# Patient Record
Sex: Male | Born: 1940 | Race: White | Hispanic: No | Marital: Single | Smoking: Never smoker
Health system: Southern US, Community
[De-identification: ages and names within clinical notes are randomized; demographics above are authoritative.]

## PROBLEM LIST (undated history)

## (undated) DIAGNOSIS — R600 Localized edema: Secondary | ICD-10-CM

## (undated) DIAGNOSIS — IMO0002 Reserved for concepts with insufficient information to code with codable children: Secondary | ICD-10-CM

## (undated) DIAGNOSIS — E119 Type 2 diabetes mellitus without complications: Secondary | ICD-10-CM

## (undated) DIAGNOSIS — T7840XA Allergy, unspecified, initial encounter: Secondary | ICD-10-CM

## (undated) DIAGNOSIS — H9190 Unspecified hearing loss, unspecified ear: Secondary | ICD-10-CM

## (undated) DIAGNOSIS — Z8719 Personal history of other diseases of the digestive system: Secondary | ICD-10-CM

## (undated) DIAGNOSIS — E785 Hyperlipidemia, unspecified: Secondary | ICD-10-CM

## (undated) DIAGNOSIS — K219 Gastro-esophageal reflux disease without esophagitis: Secondary | ICD-10-CM

## (undated) DIAGNOSIS — I4892 Unspecified atrial flutter: Secondary | ICD-10-CM

## (undated) DIAGNOSIS — D369 Benign neoplasm, unspecified site: Secondary | ICD-10-CM

## (undated) DIAGNOSIS — J189 Pneumonia, unspecified organism: Secondary | ICD-10-CM

## (undated) DIAGNOSIS — R918 Other nonspecific abnormal finding of lung field: Secondary | ICD-10-CM

## (undated) DIAGNOSIS — F1910 Other psychoactive substance abuse, uncomplicated: Secondary | ICD-10-CM

## (undated) DIAGNOSIS — F419 Anxiety disorder, unspecified: Secondary | ICD-10-CM

## (undated) DIAGNOSIS — F329 Major depressive disorder, single episode, unspecified: Secondary | ICD-10-CM

## (undated) DIAGNOSIS — I2510 Atherosclerotic heart disease of native coronary artery without angina pectoris: Secondary | ICD-10-CM

## (undated) DIAGNOSIS — Z77098 Contact with and (suspected) exposure to other hazardous, chiefly nonmedicinal, chemicals: Secondary | ICD-10-CM

## (undated) DIAGNOSIS — N189 Chronic kidney disease, unspecified: Secondary | ICD-10-CM

## (undated) DIAGNOSIS — I10 Essential (primary) hypertension: Secondary | ICD-10-CM

## (undated) DIAGNOSIS — F4310 Post-traumatic stress disorder, unspecified: Secondary | ICD-10-CM

## (undated) DIAGNOSIS — Z974 Presence of external hearing-aid: Secondary | ICD-10-CM

## (undated) DIAGNOSIS — F32A Depression, unspecified: Secondary | ICD-10-CM

## (undated) DIAGNOSIS — I311 Chronic constrictive pericarditis: Secondary | ICD-10-CM

## (undated) DIAGNOSIS — K449 Diaphragmatic hernia without obstruction or gangrene: Secondary | ICD-10-CM

## (undated) DIAGNOSIS — K7031 Alcoholic cirrhosis of liver with ascites: Secondary | ICD-10-CM

## (undated) DIAGNOSIS — Z973 Presence of spectacles and contact lenses: Secondary | ICD-10-CM

## (undated) DIAGNOSIS — D509 Iron deficiency anemia, unspecified: Secondary | ICD-10-CM

## (undated) DIAGNOSIS — F1020 Alcohol dependence, uncomplicated: Secondary | ICD-10-CM

## (undated) DIAGNOSIS — I8390 Asymptomatic varicose veins of unspecified lower extremity: Secondary | ICD-10-CM

## (undated) DIAGNOSIS — J9601 Acute respiratory failure with hypoxia: Secondary | ICD-10-CM

## (undated) DIAGNOSIS — Z9889 Other specified postprocedural states: Secondary | ICD-10-CM

## (undated) DIAGNOSIS — Z972 Presence of dental prosthetic device (complete) (partial): Secondary | ICD-10-CM

## (undated) DIAGNOSIS — I4891 Unspecified atrial fibrillation: Secondary | ICD-10-CM

## (undated) DIAGNOSIS — E871 Hypo-osmolality and hyponatremia: Secondary | ICD-10-CM

## (undated) DIAGNOSIS — K5790 Diverticulosis of intestine, part unspecified, without perforation or abscess without bleeding: Secondary | ICD-10-CM

## (undated) HISTORY — PX: ATRIAL ABLATION SURGERY: SHX560

## (undated) HISTORY — DX: Diverticulosis of intestine, part unspecified, without perforation or abscess without bleeding: K57.90

## (undated) HISTORY — PX: UMBILICAL HERNIA REPAIR: SHX196

## (undated) HISTORY — PX: VARICOSE VEIN SURGERY: SHX832

## (undated) HISTORY — DX: Hyperlipidemia, unspecified: E78.5

## (undated) HISTORY — DX: Atherosclerotic heart disease of native coronary artery without angina pectoris: I25.10

## (undated) HISTORY — PX: COLONOSCOPY: SHX174

## (undated) HISTORY — DX: Unspecified atrial flutter: I48.92

## (undated) HISTORY — DX: Allergy, unspecified, initial encounter: T78.40XA

## (undated) HISTORY — DX: Unspecified atrial fibrillation: I48.91

## (undated) HISTORY — PX: TONSILLECTOMY: SUR1361

## (undated) HISTORY — DX: Alcoholic cirrhosis of liver with ascites: K70.31

## (undated) HISTORY — DX: Chronic kidney disease, unspecified: N18.9

## (undated) HISTORY — PX: POLYPECTOMY: SHX149

## (undated) HISTORY — DX: Asymptomatic varicose veins of unspecified lower extremity: I83.90

## (undated) HISTORY — DX: Reserved for concepts with insufficient information to code with codable children: IMO0002

## (undated) HISTORY — DX: Benign neoplasm, unspecified site: D36.9

## (undated) HISTORY — DX: Type 2 diabetes mellitus without complications: E11.9

## (undated) HISTORY — DX: Pneumonia, unspecified organism: J18.9

## (undated) HISTORY — DX: Other psychoactive substance abuse, uncomplicated: F19.10

## (undated) HISTORY — DX: Essential (primary) hypertension: I10

## (undated) HISTORY — DX: Anxiety disorder, unspecified: F41.9

## (undated) HISTORY — DX: Alcohol dependence, uncomplicated: F10.20

## (undated) HISTORY — DX: Diaphragmatic hernia without obstruction or gangrene: K44.9

## (undated) HISTORY — DX: Chronic constrictive pericarditis: I31.1

## (undated) HISTORY — DX: Localized edema: R60.0

## (undated) HISTORY — DX: Iron deficiency anemia, unspecified: D50.9

## (undated) HISTORY — DX: Gastro-esophageal reflux disease without esophagitis: K21.9

---
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
## 2003-11-01 ENCOUNTER — Emergency Department (HOSPITAL_COMMUNITY): Admission: EM | Admit: 2003-11-01 | Discharge: 2003-11-01 | Payer: Self-pay | Admitting: Emergency Medicine

## 2003-11-02 ENCOUNTER — Encounter (INDEPENDENT_AMBULATORY_CARE_PROVIDER_SITE_OTHER): Payer: Self-pay | Admitting: Specialist

## 2003-11-02 ENCOUNTER — Ambulatory Visit (HOSPITAL_COMMUNITY): Admission: RE | Admit: 2003-11-02 | Discharge: 2003-11-02 | Payer: Self-pay | Admitting: General Surgery

## 2004-06-11 ENCOUNTER — Ambulatory Visit: Payer: Self-pay | Admitting: Gastroenterology

## 2004-06-19 DIAGNOSIS — D369 Benign neoplasm, unspecified site: Secondary | ICD-10-CM

## 2004-06-19 HISTORY — DX: Benign neoplasm, unspecified site: D36.9

## 2004-06-25 ENCOUNTER — Ambulatory Visit: Payer: Self-pay | Admitting: Gastroenterology

## 2004-07-03 ENCOUNTER — Ambulatory Visit: Payer: Self-pay | Admitting: Gastroenterology

## 2006-05-04 ENCOUNTER — Ambulatory Visit: Payer: Self-pay | Admitting: Cardiology

## 2006-05-04 ENCOUNTER — Inpatient Hospital Stay (HOSPITAL_COMMUNITY): Admission: EM | Admit: 2006-05-04 | Discharge: 2006-05-05 | Payer: Self-pay | Admitting: Emergency Medicine

## 2006-05-06 ENCOUNTER — Inpatient Hospital Stay (HOSPITAL_COMMUNITY): Admission: EM | Admit: 2006-05-06 | Discharge: 2006-05-09 | Payer: Self-pay | Admitting: Emergency Medicine

## 2006-05-06 ENCOUNTER — Ambulatory Visit: Payer: Self-pay | Admitting: *Deleted

## 2006-05-13 ENCOUNTER — Ambulatory Visit: Payer: Self-pay | Admitting: Cardiology

## 2006-05-18 ENCOUNTER — Ambulatory Visit: Payer: Self-pay

## 2006-05-20 ENCOUNTER — Ambulatory Visit: Payer: Self-pay | Admitting: Cardiology

## 2006-05-22 ENCOUNTER — Ambulatory Visit: Payer: Self-pay | Admitting: Cardiology

## 2006-05-26 ENCOUNTER — Ambulatory Visit: Payer: Self-pay | Admitting: Cardiology

## 2006-05-27 ENCOUNTER — Ambulatory Visit: Payer: Self-pay | Admitting: Cardiology

## 2006-06-09 ENCOUNTER — Ambulatory Visit: Payer: Self-pay | Admitting: Internal Medicine

## 2006-06-09 ENCOUNTER — Ambulatory Visit: Payer: Self-pay

## 2006-06-16 ENCOUNTER — Encounter: Payer: Self-pay | Admitting: Cardiology

## 2006-06-16 ENCOUNTER — Ambulatory Visit: Payer: Self-pay

## 2006-06-25 ENCOUNTER — Ambulatory Visit: Payer: Self-pay | Admitting: Cardiology

## 2006-06-25 LAB — CONVERTED CEMR LAB
ALT: 29 units/L (ref 0–40)
AST: 23 units/L (ref 0–37)
Albumin: 3.3 g/dL — ABNORMAL LOW (ref 3.5–5.2)
Alkaline Phosphatase: 75 units/L (ref 39–117)
Bilirubin, Direct: 0.2 mg/dL (ref 0.0–0.3)
Cholesterol: 143 mg/dL (ref 0–200)
HDL: 32.4 mg/dL — ABNORMAL LOW (ref >39.0)
LDL Cholesterol: 97 mg/dL (ref 0–99)
Total Bilirubin: 1 mg/dL (ref 0.3–1.2)
Total CHOL/HDL Ratio: 4.4
Total Protein: 6.5 g/dL (ref 6.0–8.3)
Triglycerides: 67 mg/dL (ref 0–149)
VLDL: 13 mg/dL (ref 0–40)

## 2006-06-29 ENCOUNTER — Ambulatory Visit: Payer: Self-pay | Admitting: Internal Medicine

## 2006-09-10 ENCOUNTER — Ambulatory Visit: Payer: Self-pay | Admitting: Cardiology

## 2006-09-14 ENCOUNTER — Ambulatory Visit: Payer: Self-pay | Admitting: Cardiology

## 2006-09-14 ENCOUNTER — Ambulatory Visit: Payer: Self-pay

## 2006-09-14 ENCOUNTER — Encounter: Payer: Self-pay | Admitting: Cardiology

## 2006-09-14 LAB — CONVERTED CEMR LAB
BUN: 13 mg/dL (ref 6–23)
Chloride: 104 meq/L (ref 96–112)
Creatinine, Ser: 0.7 mg/dL (ref 0.4–1.5)
GFR calc non Af Amer: 121 mL/min
Potassium: 3.8 meq/L (ref 3.5–5.1)
Pro B Natriuretic peptide (BNP): 71 pg/mL (ref 0.0–100.0)
Sodium: 138 meq/L (ref 135–145)

## 2006-10-29 ENCOUNTER — Ambulatory Visit: Payer: Self-pay | Admitting: Cardiology

## 2006-11-23 ENCOUNTER — Ambulatory Visit: Payer: Self-pay | Admitting: Cardiology

## 2006-11-23 LAB — CONVERTED CEMR LAB
BUN: 7 mg/dL (ref 6–23)
Creatinine, Ser: 0.8 mg/dL (ref 0.4–1.5)
GFR calc Af Amer: 126 mL/min
GFR calc non Af Amer: 104 mL/min
Potassium: 4.4 meq/L (ref 3.5–5.1)
Sodium: 141 meq/L (ref 135–145)

## 2006-12-23 ENCOUNTER — Ambulatory Visit: Payer: Self-pay | Admitting: Cardiology

## 2007-10-28 ENCOUNTER — Encounter: Payer: Self-pay | Admitting: Gastroenterology

## 2007-11-16 ENCOUNTER — Encounter: Payer: Self-pay | Admitting: Gastroenterology

## 2007-11-29 ENCOUNTER — Telehealth: Payer: Self-pay | Admitting: Gastroenterology

## 2007-12-16 ENCOUNTER — Ambulatory Visit: Payer: Self-pay | Admitting: Gastroenterology

## 2007-12-16 DIAGNOSIS — Z860100 Personal history of colon polyps, unspecified: Secondary | ICD-10-CM | POA: Insufficient documentation

## 2007-12-16 DIAGNOSIS — K219 Gastro-esophageal reflux disease without esophagitis: Secondary | ICD-10-CM | POA: Insufficient documentation

## 2007-12-16 DIAGNOSIS — R1013 Epigastric pain: Secondary | ICD-10-CM

## 2008-01-20 ENCOUNTER — Ambulatory Visit: Payer: Self-pay | Admitting: Gastroenterology

## 2008-01-20 ENCOUNTER — Encounter: Payer: Self-pay | Admitting: Gastroenterology

## 2008-01-21 ENCOUNTER — Encounter: Payer: Self-pay | Admitting: Gastroenterology

## 2008-02-10 ENCOUNTER — Ambulatory Visit: Payer: Self-pay | Admitting: Gastroenterology

## 2008-03-17 ENCOUNTER — Encounter (INDEPENDENT_AMBULATORY_CARE_PROVIDER_SITE_OTHER): Payer: Self-pay | Admitting: Internal Medicine

## 2008-03-17 ENCOUNTER — Ambulatory Visit: Payer: Self-pay

## 2008-03-22 ENCOUNTER — Encounter: Payer: Self-pay | Admitting: Gastroenterology

## 2008-07-18 ENCOUNTER — Ambulatory Visit: Payer: Self-pay | Admitting: Cardiology

## 2008-07-24 ENCOUNTER — Telehealth (INDEPENDENT_AMBULATORY_CARE_PROVIDER_SITE_OTHER): Payer: Self-pay

## 2008-07-25 ENCOUNTER — Encounter: Payer: Self-pay | Admitting: Cardiology

## 2008-07-25 ENCOUNTER — Ambulatory Visit: Payer: Self-pay

## 2008-11-01 DIAGNOSIS — K5730 Diverticulosis of large intestine without perforation or abscess without bleeding: Secondary | ICD-10-CM | POA: Insufficient documentation

## 2008-11-01 DIAGNOSIS — I4891 Unspecified atrial fibrillation: Secondary | ICD-10-CM

## 2008-11-01 DIAGNOSIS — I999 Unspecified disorder of circulatory system: Secondary | ICD-10-CM | POA: Insufficient documentation

## 2008-11-01 DIAGNOSIS — K649 Unspecified hemorrhoids: Secondary | ICD-10-CM | POA: Insufficient documentation

## 2008-11-01 DIAGNOSIS — F1010 Alcohol abuse, uncomplicated: Secondary | ICD-10-CM | POA: Insufficient documentation

## 2008-11-01 DIAGNOSIS — K429 Umbilical hernia without obstruction or gangrene: Secondary | ICD-10-CM | POA: Insufficient documentation

## 2008-11-01 DIAGNOSIS — I4892 Unspecified atrial flutter: Secondary | ICD-10-CM

## 2008-11-01 DIAGNOSIS — R002 Palpitations: Secondary | ICD-10-CM

## 2008-11-01 DIAGNOSIS — R079 Chest pain, unspecified: Secondary | ICD-10-CM | POA: Insufficient documentation

## 2008-11-01 DIAGNOSIS — R609 Edema, unspecified: Secondary | ICD-10-CM

## 2008-11-01 DIAGNOSIS — F1020 Alcohol dependence, uncomplicated: Secondary | ICD-10-CM

## 2008-11-01 DIAGNOSIS — R071 Chest pain on breathing: Secondary | ICD-10-CM

## 2008-11-01 DIAGNOSIS — I10 Essential (primary) hypertension: Secondary | ICD-10-CM

## 2008-11-01 DIAGNOSIS — E785 Hyperlipidemia, unspecified: Secondary | ICD-10-CM

## 2009-06-01 ENCOUNTER — Encounter (INDEPENDENT_AMBULATORY_CARE_PROVIDER_SITE_OTHER): Payer: Self-pay | Admitting: *Deleted

## 2009-09-12 ENCOUNTER — Encounter: Payer: Self-pay | Admitting: Cardiology

## 2009-09-24 ENCOUNTER — Ambulatory Visit: Payer: Self-pay | Admitting: Cardiology

## 2009-12-23 ENCOUNTER — Emergency Department (HOSPITAL_COMMUNITY): Admission: EM | Admit: 2009-12-23 | Discharge: 2009-12-23 | Payer: Self-pay | Admitting: Emergency Medicine

## 2010-01-04 ENCOUNTER — Ambulatory Visit: Payer: Self-pay | Admitting: Vascular Surgery

## 2010-01-07 ENCOUNTER — Ambulatory Visit: Payer: Self-pay | Admitting: Vascular Surgery

## 2010-03-19 ENCOUNTER — Encounter: Payer: Self-pay | Admitting: Gastroenterology

## 2010-03-21 ENCOUNTER — Ambulatory Visit (HOSPITAL_COMMUNITY): Admission: RE | Admit: 2010-03-21 | Discharge: 2010-03-21 | Payer: Self-pay | Admitting: Internal Medicine

## 2010-04-16 ENCOUNTER — Ambulatory Visit: Payer: Self-pay | Admitting: Vascular Surgery

## 2010-04-25 ENCOUNTER — Ambulatory Visit: Payer: Self-pay | Admitting: Vascular Surgery

## 2010-04-30 ENCOUNTER — Ambulatory Visit: Payer: Self-pay | Admitting: Vascular Surgery

## 2010-05-06 ENCOUNTER — Ambulatory Visit: Payer: Self-pay | Admitting: Vascular Surgery

## 2010-05-14 ENCOUNTER — Ambulatory Visit: Payer: Self-pay | Admitting: Vascular Surgery

## 2010-05-27 ENCOUNTER — Ambulatory Visit
Admission: RE | Admit: 2010-05-27 | Discharge: 2010-05-27 | Payer: Self-pay | Source: Home / Self Care | Attending: Vascular Surgery | Admitting: Vascular Surgery

## 2010-06-03 ENCOUNTER — Ambulatory Visit
Admission: RE | Admit: 2010-06-03 | Discharge: 2010-06-03 | Payer: Self-pay | Source: Home / Self Care | Attending: Vascular Surgery | Admitting: Vascular Surgery

## 2010-06-03 ENCOUNTER — Ambulatory Visit: Admit: 2010-06-03 | Payer: Self-pay | Admitting: Vascular Surgery

## 2010-06-11 NOTE — Procedures (Unsigned)
DUPLEX DEEP VENOUS EXAM - LOWER EXTREMITY  INDICATION:  Status post left greater saphenous vein ablation.  HISTORY:  Edema:  No. Trauma/Surgery:  Status post left greater saphenous vein 1 week ago. Pain:  Yes. PE:  No. Previous DVT:  No. Anticoagulants:  No. Other:  DUPLEX EXAM:               CFV   SFV   PopV  PTV    GSV               R  L  R  L  R  L  R   L  R  L Thrombosis    o  o     o     o      o     + Spontaneous   +  +     +     +      +     o Phasic        +  +     +     +      +     o Augmentation  +  +     +     +      +     o Compressible  +  +     +     +      +     o Competent     +  +     +     +      +     +  Legend:  + - yes  o - no  p - partial  D - decreased  IMPRESSION: 1. No evidence of acute deep venous thrombosis within the left lower     extremity. 2. A successful left greater saphenous vein ablation from the     saphenofemoral junction to distal insertion site.   _____________________________ Quita Skye. Hart Rochester, M.D.  OD/MEDQ  D:  06/04/2010  T:  06/04/2010  Job:  [id number]

## 2010-06-20 NOTE — Letter (Signed)
Summary: Appointment - Reminder 2  [facility], Main Office  [address]   [place], [place] [address]   Phone: [phone]  Fax: [phone]     June 01, 2009 MRN: [id number]   Caelen Siegrist [address] [place], [place]  [address]   Dear Mr. Terrance,  Our records indicate that it is time to schedule a follow-up appointment. Dr.Crenshaw recommended that you follow up with [place] in March,2011. It is very important that we reach you to schedule this appointment. We look forward to participating in your health care needs. Please contact us at the number listed above at your earliest convenience to schedule your appointment.  If you are unable to make an appointment at this time, give [place] a call so we can update our records.     Sincerely,   Glass blower/designer

## 2010-06-20 NOTE — Letter (Signed)
Summary: [facility] Office Note  [facility] Office Note   Imported By: Roderic Ovens 10/16/2009 11:15:55  _____________________________________________________________________  External Attachment:    Type:   Image     Comment:   External Document

## 2010-06-20 NOTE — Assessment & Plan Note (Signed)
Summary: 1 YR F/U    Primary Provider:  [name], MD   History of Present Illness: [name] is a gentleman with a history of atrial flutter ablation with a mildly abnormal nuclear study (nuclear study on May 18, 2006, suggest an ejection fraction of 52%.  There is a small area of apical ischemia).  His last echocardiogram was performed on March 17, 2008.  His ejection fraction had normalized.  There was diastolic dysfunction and there was flattening of the interventricular septum during diastole.  There was biatrial enlargement. We repeated his echocardiogram in March of 2010. Ejection fraction was 56%. There is a question of trivial reversibility at the apex but felt to be normal. I last saw him in March of 2010. Since then he has dyspnea with more extreme activities but not with routine activities. It is relieved with rest. There is no associated chest pain. There is no orthopnea, PND, palpitations or syncope. He has chronic pedal edema. He does not have exertional chest pain.  Current Medications (verified): 1)  Lisinopril 10 Mg  Tabs (Lisinopril) .... One Tablet By Mouth Once Daily 2)  Aspirin 81 Mg  Tabs (Aspirin) .... One Tablet By Mouth Once Daily 3)  Fish Oil   Oil (Fish Oil) .... 2 Gel Caps Once Daily 4)  Lasix 40 Mg  Tabs (Furosemide) .... One Tablet Once A Day 5)  Toprol Xl 50 Mg  Xr24h-Tab (Metoprolol Succinate) .... One Tablet By Mouth Once Daily 6)  Nexium 40 Mg  Cpdr (Esomeprazole Magnesium) .... One Tablet Two Times A Day  Allergies: 1)  ! Sulfa 2)  Lipitor  Past History:  Past Medical History: HEMORRHOIDS (ICD-455.6) DIVERTICULOSIS OF COLON (ICD-562.10) HYPERLIPIDEMIA (ICD-272.4) ATRIAL FIBRILLATION (ICD-427.31) in the setting of rapid atrial pacing ALCOHOLISM (ICD-303.90) HYPERTENSION (ICD-401.9) ATRIAL FLUTTER (ICD-427.32) s/p ablation GERD (ICD-530.81) COLONIC POLYPS, ADENOMATOUS, HX OF (ICD-V12.72)  Past Surgical History: Reviewed history from  12/16/2007 and no changes required. Hernia Surgery-2005  Social History: Reviewed history from 12/16/2007 and no changes required. Married Patient has never smoked.  Alcohol Use - yes 4/5 [place] daily Illicit Drug Use - no Patient does not get regular exercise.   Review of Systems       Right shoulder pain but no fevers or chills, productive cough, hemoptysis, dysphasia, odynophagia, melena, hematochezia, dysuria, hematuria, rash, seizure activity, orthopnea, PND,  claudication. Remaining systems are negative.   Vital Signs:  Patient profile:   70 year old male Height:      68 inches Weight:      207 pounds BMI:     31.59 Pulse rate:   70 / minute Pulse rhythm:   regular Resp:     14 per minute BP sitting:   120 / 80  (left arm)  Vitals Entered By: Kem Parkinson (Sep 24, 2009 9:39 AM)  Physical Exam  General:  Well-developed well-nourished in no acute distress.  Skin is warm and dry.  HEENT is normal.  Neck is supple. No thyromegaly.  Chest is clear to auscultation with normal expansion.  Cardiovascular exam is regular rate and rhythm.  Abdominal exam nontender or distended. No masses palpated. Extremities show trace to 1+ edema with chronic skin changes. neuro grossly intact    EKG  Procedure date:  09/24/2009  Findings:      Sinus rhythm at a rate of 70. Left anterior fascicular block. First degree AV block. Nonspecific T-wave changes.  Impression & Recommendations:  Problem # 1:  ATRIAL FLUTTER (ICD-427.32)  Status post ablation. No recurrences. Continue present medications. His updated medication list for this problem includes:    Aspirin 81 Mg Tabs (Aspirin) ..... One tablet by mouth once daily    Toprol Xl 50 Mg Xr24h-tab (Metoprolol succinate) ..... One tablet by mouth once daily  Problem # 2:  HYPERTENSION (ICD-401.9) Blood pressure controlled on present medications. Will continue. Renal function and potassium monitored by primary care. His updated  medication list for this problem includes:    Lisinopril 10 Mg Tabs (Lisinopril) ..... One tablet by mouth once daily    Aspirin 81 Mg Tabs (Aspirin) ..... One tablet by mouth once daily    Lasix 40 Mg Tabs (Furosemide) ..... One tablet once a day    Toprol Xl 50 Mg Xr24h-tab (Metoprolol succinate) ..... One tablet by mouth once daily  Problem # 3:  ATRIAL FIBRILLATION (ICD-427.31) This only occurred with rapid atrial pacing. Continue present medications. His updated medication list for this problem includes:    Aspirin 81 Mg Tabs (Aspirin) ..... One tablet by mouth once daily    Toprol Xl 50 Mg Xr24h-tab (Metoprolol succinate) ..... One tablet by mouth once daily  Problem # 4:  HYPERLIPIDEMIA (ICD-272.4) Management per primary care.  Problem # 5:  PEDAL EDEMA (ICD-782.3) Continue Lasix.  Problem # 6:  ISCHEMIA (ICD-459.9) Prior stress tests with suggestion of abnormality the most recent test felt to be normal. No further workup indicated. Patient is not having chest pain.

## 2010-06-24 ENCOUNTER — Other Ambulatory Visit (INDEPENDENT_AMBULATORY_CARE_PROVIDER_SITE_OTHER): Payer: Medicare Other | Admitting: Vascular Surgery

## 2010-06-24 DIAGNOSIS — I83893 Varicose veins of bilateral lower extremities with other complications: Secondary | ICD-10-CM

## 2010-06-28 NOTE — Assessment & Plan Note (Signed)
OFFICE VISIT  Witzke, Marte E DOB:  Jun 19, 1940                                       06/24/2010 HYQMV#:[id number]  The patient had laser ablation of his right small saphenous vein today for venous hypertension and painful varicosities with a history of bleeding.  He tolerated the procedure well.  He had no connection between the small saphenous vein and the popliteal vein and the small saphenous was ablated up to the mid thigh level.  He tolerated the procedure well.  Will return in 1 week for venous duplex exam.    Quita Skye. Hart Rochester, M.D. Electronically Signed  JDL/MEDQ  D:  06/24/2010  T:  06/25/2010  Job:  [id number]

## 2010-07-02 ENCOUNTER — Ambulatory Visit (INDEPENDENT_AMBULATORY_CARE_PROVIDER_SITE_OTHER): Payer: Medicare Other | Admitting: Vascular Surgery

## 2010-07-02 ENCOUNTER — Encounter (INDEPENDENT_AMBULATORY_CARE_PROVIDER_SITE_OTHER): Payer: Medicare Other

## 2010-07-02 DIAGNOSIS — I83893 Varicose veins of bilateral lower extremities with other complications: Secondary | ICD-10-CM

## 2010-07-02 DIAGNOSIS — Z48812 Encounter for surgical aftercare following surgery on the circulatory system: Secondary | ICD-10-CM

## 2010-07-02 DIAGNOSIS — I87399 Chronic venous hypertension (idiopathic) with other complications of unspecified lower extremity: Secondary | ICD-10-CM

## 2010-07-02 NOTE — Assessment & Plan Note (Signed)
OFFICE VISIT  [name], [name] DOB:  07-15-1940                                       07/02/2010 EAVWU#:[id number]  The patient returns 1 week post laser ablation of the right small saphenous vein from the mid calf to the mid thigh area.  It was a vein of Giacomini with no connection to the popliteal vein.  He has had some moderate discomfort along the course of the small saphenous which is now resolving.  States that the right leg distally feels less tight and the left leg feels much better than it did prior to his procedure.  He has now had laser ablation of both great saphenous veins and the right small saphenous vein.  PHYSICAL EXAMINATION:  On exam today blood pressure 126/87, heart rate 72, respirations 24.  Both legs have changes consistent with chronic venous insufficiency but less edematous than prior to his procedures. There is mild discomfort along the course of the right small saphenous vein as described.  He has 3+ dorsalis pedis pulse palpable.  Today I ordered a venous duplex exam of the right leg which I have reviewed and interpreted.  The right small saphenous vein is totally occluded from the distal insertion site to the mid thigh level.  There is some flow in the proximal right GSV near the junction but not in the distal thigh and mid thigh area.  In general I think he is doing well.  We will see him back in 3 months and will repeat the reflux studies in both legs to be certain that the right GSV has not recanalized.  We will also make a decision regarding possible stab phlebectomies at that time depending on his symptomatology.    [name], M.D. Electronically Signed  JDL/MEDQ  D:  07/02/2010  T:  07/02/2010  Job:  [id number]

## 2010-07-09 NOTE — Procedures (Unsigned)
DUPLEX DEEP VENOUS EXAM - LOWER EXTREMITY  INDICATION:  Right short saphenous vein laser ablation.  HISTORY:  Edema:  No. Trauma/Surgery:  Right greater saphenous vein laser ablation on 04/25/2010, left greater saphenous vein laser ablation on 05/27/2010, right short saphenous vein laser ablation on 06/24/2010. Pain:  Right posterior knee pain. PE:  No. Previous DVT:  No. Anticoagulants: Other:  DUPLEX EXAM:               CFV   SFV   PopV  PTV    GSV               R  L  R  L  R  L  R   L  R  L Thrombosis    o  o  o     o     o      + Spontaneous   +  +  +     +     +      o Phasic        +  +  +     +     +      o Augmentation  +  +  +     +     +      o Compressible  +  +  +     +     +      o Competent     o  +  +     o     +      o  Legend:  + - yes  o - no  p - partial  D - decreased  IMPRESSION: 1. No evidence of deep vein thrombosis noted in the right lower     extremity. 2. The right short saphenous vein appears to be totally occluded from     the distal insertion site to the popliteal fossa level. 3. The right greater saphenous vein appears totally occluded from the     distal insertion site to the mid thigh level and partially occluded     at the proximal to mid thigh level with reflux of >500     milliseconds. 4. Reflux of >500 milliseconds noted in the right common femoral and     popliteal veins.   _____________________________ [name], M.D.  CH/MEDQ  D:  07/02/2010  T:  07/02/2010  Job:  [id number]

## 2010-07-30 LAB — AMYLASE, BODY FLUID: Amylase, Fluid: 24 U/L

## 2010-07-30 LAB — BODY FLUID CULTURE

## 2010-07-30 LAB — BODY FLUID CELL COUNT WITH DIFFERENTIAL: Neutrophil Count, Fluid: 1 % (ref 0–25)

## 2010-07-30 LAB — GLUCOSE, SEROUS FLUID: Glucose, Fluid: 96 mg/dL

## 2010-07-30 LAB — PATHOLOGIST SMEAR REVIEW: Tech Review: REACTIVE

## 2010-07-30 LAB — PROTEIN, BODY FLUID: Total protein, fluid: 4.3 g/dL

## 2010-07-31 ENCOUNTER — Telehealth: Payer: Self-pay | Admitting: Gastroenterology

## 2010-08-06 NOTE — Progress Notes (Signed)
Summary: triage   Phone Note Call from Patient Call back at Home Phone [phone]   Caller: spouse Stanton Kidney  Call For: Dr Russella Dar Reason for Call: Talk to Nurse Summary of Call: Patient is having severe stomach pain wants to be seen sooner than his appt 4-4 or wants to know what to do. Initial call taken by: Tawni Levy,  July 31, 2010 2:10 PM  Follow-up for Phone Call        I spoke with the patient he is c/o gas pains.  I did review a low gas -diet with him.  He stopped his nexium.  He has been trying gas-x and this helps his symptoms.  I have asked him to please resume his nexium, take gas-x ac each meal and follow the low gas diet.  i have asked him to please call me back next week if he has no improvement in his symptoms and we will work him in with the extender. Follow-up by: Darcey Nora RN, CGRN,  July 31, 2010 4:15 PM  Additional Follow-up for Phone Call Additional follow up Details #1::        Agree with above. Additional Follow-up by: Meryl Dare MD Clementeen Graham,  August 03, 2010 11:51 PM

## 2010-08-19 ENCOUNTER — Ambulatory Visit: Payer: Medicare Other | Admitting: Gastroenterology

## 2010-08-21 ENCOUNTER — Other Ambulatory Visit (INDEPENDENT_AMBULATORY_CARE_PROVIDER_SITE_OTHER): Payer: Medicare Other

## 2010-08-21 ENCOUNTER — Ambulatory Visit (INDEPENDENT_AMBULATORY_CARE_PROVIDER_SITE_OTHER): Payer: Medicare Other | Admitting: Gastroenterology

## 2010-08-21 ENCOUNTER — Encounter: Payer: Self-pay | Admitting: Gastroenterology

## 2010-08-21 VITALS — BP 110/72 | HR 84 | Ht 68.0 in | Wt 225.6 lb

## 2010-08-21 DIAGNOSIS — R1084 Generalized abdominal pain: Secondary | ICD-10-CM

## 2010-08-21 DIAGNOSIS — R635 Abnormal weight gain: Secondary | ICD-10-CM

## 2010-08-21 LAB — BASIC METABOLIC PANEL
CO2: 30 mEq/L (ref 19–32)
Calcium: 9.3 mg/dL (ref 8.4–10.5)
Chloride: 98 mEq/L (ref 96–112)
Glucose, Bld: 101 mg/dL — ABNORMAL HIGH (ref 70–99)
Potassium: 4 mEq/L (ref 3.5–5.1)
Sodium: 136 mEq/L (ref 135–145)

## 2010-08-21 LAB — HEPATIC FUNCTION PANEL
AST: 19 U/L (ref 0–37)
Albumin: 3.9 g/dL (ref 3.5–5.2)
Alkaline Phosphatase: 143 U/L — ABNORMAL HIGH (ref 39–117)
Bilirubin, Direct: 0.4 mg/dL — ABNORMAL HIGH (ref 0.0–0.3)
Total Protein: 7.3 g/dL (ref 6.0–8.3)

## 2010-08-21 LAB — CBC WITH DIFFERENTIAL/PLATELET
Basophils Absolute: 0 10*3/uL (ref 0.0–0.1)
Eosinophils Absolute: 0.1 10*3/uL (ref 0.0–0.7)
Hemoglobin: 11.6 g/dL — ABNORMAL LOW (ref 13.0–17.0)
Lymphocytes Relative: 19.1 % (ref 12.0–46.0)
Lymphs Abs: 0.7 10*3/uL (ref 0.7–4.0)
MCHC: 33.8 g/dL (ref 30.0–36.0)
Neutro Abs: 2.1 10*3/uL (ref 1.4–7.7)
Platelets: 133 10*3/uL — ABNORMAL LOW (ref 150.0–400.0)
RDW: 17 % — ABNORMAL HIGH (ref 11.5–14.6)

## 2010-08-21 LAB — PROTIME-INR: INR: 1.3 ratio — ABNORMAL HIGH (ref 0.8–1.0)

## 2010-08-21 NOTE — Patient Instructions (Signed)
You have been scheduled for a CT scan of the abdomen and pelvis with separate instruction sheet given. Get your labs drawn today in the basement.  Cc: Dr. Timothy Lasso

## 2010-08-21 NOTE — Progress Notes (Signed)
History of Present Illness: This is a 70 year old male here today with his wife complaining of worsening generalized abdominal discomfort and abdominal distention over the past several months. He has noted a 20 pound weight gain. He underwent an ultrasound guided paracentesis in November 2011 which revealed an amylase of 24, glucose of 96, total protein 4.3 and 710 white blood cells with 1% neutrophils. Culture and sensitivity had no growth.  Current Medications, Allergies, Past Medical History, Past Surgical History, Family History and Social History were reviewed in [employer] record.  Physical Exam: General: Well developed , well nourished, no acute distress Head: Normocephalic and atraumatic Eyes:  sclerae anicteric, EOMI Ears: Normal auditory acuity Mouth: No deformity or lesions Lungs: Clear throughout to auscultation Heart: Regular rate and rhythm; no murmurs, rubs or bruits Abdomen: Soft, markedly distended with probable shifting dullness, Mild diffuse tenderness without rebound or guarding. No masses, hepatosplenomegaly or hernias noted. Normal Bowel sounds Musculoskeletal: Symmetrical with no gross deformities  Pulses:  Normal pulses noted Extremities: No clubbing, chronic venous stasis changes and brawny edema in both lower extremities Neurological: Alert oriented x 4, grossly nonfocal Psychological:  Alert and cooperative. Normal mood and affect  Assessment and Recommendations:  1. Abdominal pain, abdominal distention and weight gain. I suspect his symptoms are secondary to ascites. He has a history of alcohol use. He is advised to discontinue all alcohol use and begin a low sodium diet. Rule out cirrhosis and other causes of portal hypertension and ascites. Schedule an abdominal and pelvic CT scan. Blood work as ordered. He will likely need a therapeutic paracentesis with cell counts, albumin, cytology and cultures. He will likely need further hepatic  evaluation and an upper endoscopy to screen for varices.   2. History of adenomatous colon polyps. Surveillance colonoscopy recommended September 2012.   3. GERD. Symptoms well controlled on Nexium 40 mg twice daily and antireflux measures. Continue current management.

## 2010-08-22 LAB — TSH: TSH: 1.67 u[IU]/mL (ref 0.35–5.50)

## 2010-08-27 ENCOUNTER — Ambulatory Visit (INDEPENDENT_AMBULATORY_CARE_PROVIDER_SITE_OTHER)
Admission: RE | Admit: 2010-08-27 | Discharge: 2010-08-27 | Disposition: A | Discharge status: Home / Self Care | Payer: Medicare Other | Source: Ambulatory Visit | Attending: Gastroenterology | Admitting: Gastroenterology

## 2010-08-27 ENCOUNTER — Telehealth: Payer: Self-pay

## 2010-08-27 DIAGNOSIS — R1084 Generalized abdominal pain: Secondary | ICD-10-CM

## 2010-08-27 DIAGNOSIS — R635 Abnormal weight gain: Secondary | ICD-10-CM

## 2010-08-27 HISTORY — DX: Other specified postprocedural states: Z98.890

## 2010-08-27 HISTORY — DX: Personal history of other diseases of the digestive system: Z87.19

## 2010-08-27 MED ORDER — IOHEXOL 300 MG/ML  SOLN
100.0000 mL | Freq: Once | INTRAMUSCULAR | Status: AC | PRN
  Administered 2010-08-27: 100 mL via INTRAVENOUS

## 2010-08-27 NOTE — Telephone Encounter (Signed)
Patient is notified of US guided paracentesis scheduled for 08/29/10 at 2:00.

## 2010-08-27 NOTE — Telephone Encounter (Signed)
Message copied by Darcey Nora on Tue Aug 27, 2010  2:53 PM ------      Message from: Harlow Mares      Created: Tue Aug 27, 2010  1:00 PM                   ----- Message -----         From: Eliezer Bottom., MD         Sent: 08/27/2010  10:46 AM           To: Christie Nottingham, CMA            Please schedule a large volume ultrasound guided paracentesis with albumin infusion as we have done with other patients and with the studies outlined in my recent office note.

## 2010-08-27 NOTE — Progress Notes (Signed)
See phone note from 08/27/10

## 2010-08-28 ENCOUNTER — Telehealth: Payer: Self-pay | Admitting: Gastroenterology

## 2010-08-28 NOTE — Telephone Encounter (Signed)
Copy of order faxed.

## 2010-08-29 ENCOUNTER — Other Ambulatory Visit: Payer: Self-pay | Admitting: Interventional Radiology

## 2010-08-29 ENCOUNTER — Encounter (HOSPITAL_COMMUNITY): Payer: PRIVATE HEALTH INSURANCE

## 2010-08-29 ENCOUNTER — Ambulatory Visit (HOSPITAL_COMMUNITY)
Admission: RE | Admit: 2010-08-29 | Discharge: 2010-08-29 | Disposition: A | Discharge status: Home / Self Care | Payer: Medicare Other | Source: Ambulatory Visit | Attending: Gastroenterology | Admitting: Gastroenterology

## 2010-08-29 DIAGNOSIS — R188 Other ascites: Secondary | ICD-10-CM | POA: Insufficient documentation

## 2010-08-29 LAB — ALBUMIN, FLUID (OTHER)

## 2010-08-29 LAB — BODY FLUID CELL COUNT WITH DIFFERENTIAL: Lymphs, Fluid: 88 %

## 2010-08-29 LAB — PATHOLOGIST SMEAR REVIEW

## 2010-08-30 ENCOUNTER — Telehealth: Payer: Self-pay

## 2010-08-30 MED ORDER — SPIRONOLACTONE 100 MG PO TABS: 100.0000 mg | ORAL_TABLET | Freq: Every day | ORAL | Status: DC

## 2010-08-30 NOTE — Telephone Encounter (Signed)
Message copied by Christie Nottingham on Fri Aug 30, 2010 12:55 PM ------      Message from: Claudette Head      Created: Fri Aug 30, 2010  8:57 AM       Begin spironolactone 100 mg qam      Continue furosemide 40 mg qam      Very low Na diet      BMET in 5 days      Albumin gradient was 1.1

## 2010-08-30 NOTE — Telephone Encounter (Signed)
Notified pt of results of Paracentesis. Told pt to start spironolactone 100 mg daily with furosemide 40 mg daily. Prescription was sent to the pharmacy for spironolactone. Pt informed to stay on a low sodium diet x 5 days with a repeat B-Met in 5 days. Pt told to come in next Tuesday for repeat B-Met. Pt agreed and verbalized understanding.

## 2010-09-02 LAB — BODY FLUID CULTURE
Culture: NO GROWTH
Gram Stain: NONE SEEN

## 2010-09-30 ENCOUNTER — Ambulatory Visit: Payer: Medicare Other | Admitting: Vascular Surgery

## 2010-10-01 NOTE — Assessment & Plan Note (Signed)
OFFICE VISIT   Tamburrino, Jebediah E  DOB:  [birth year]                                       04/25/2010  EAVWU#:[id number]   [name] had laser ablation of his right great saphenous vein for  multiple painful varicosities secondary to gross reflux in the right  great saphenous system up to the saphenofemoral junction.  He tolerated  the procedure well.  He will return in 1 week for venous duplex exam to  confirm closure.     [name], M.D.  Electronically Signed   JDL/MEDQ  D:  04/25/2010  T:  04/25/2010  Job:  [id number]

## 2010-10-01 NOTE — Assessment & Plan Note (Signed)
OFFICE VISIT   Deloney, Gared E  DOB:  [birth year]                                       04/30/2010  [id number]#:[id number]   Patient returns 1 week post laser ablation of his right great saphenous  vein for venous hypertension and severe skin changes in his lower  extremity secondary to venous hypertension.  He has had some mild-to-  moderate discomfort along the course of the right great saphenous vein  from the knee to the groin and has had some blistering behind the knee  secondary to the stockings.  He states that below the knee, the leg is  already much less tight feeling than it was in the past, and there is  decreased edema in the ankle level.  He continues to have the aching,  throbbing, and burning discomfort in the left leg secondary to bulging  varicosities in the thigh and calf.  He denies any chest pain, dyspnea  on exertion, PND, orthopnea, chronic bronchitis, or productive cough.   PAST MEDICAL HISTORY:  1. Hypertension.  2. History of rapid atrial fib with post ablation by Dr. Lewayne Bunting      3 years ago.  3. Negative for coronary artery disease, diabetes, or stroke.   PHYSICAL EXAMINATION:  Blood pressure 128/80, heart rate 64,  respirations 24.  General:  He is a well-developed and well-nourished  male in no apparent distress, alert and oriented x3.  HEENT:  Normal for  age.  EOMs intact.  Lungs:  Clear to auscultation.  No rhonchi or  wheezing.  Abdomen:  Soft, nontender with no masses.  Lower extremity  exam reveals 3+ femoral and popliteal pulse on the right side.  He has  some moderate ecchymosis along the course of the great saphenous vein  from the knee to the saphenofemoral junction with mild-to-moderate  tenderness in the great saphenous system.  The right lower leg below the  knee is less tense than it was prior to his ablation but continues to  have bulging varicosities in the right posterior calf of the lesser  saphenous  distribution.   Today I ordered a venous duplex exam which I have reviewed and  interpreted.  There is no evidence of DVT, and the right great saphenous  vein is occluded from the knee to the saphenofemoral junction.   I reassured him regarding these findings.  He will return next week to  have a similar procedure performed on the left leg.     [name], M.D.  Electronically Signed   JDL/MEDQ  D:  04/30/2010  T:  05/01/2010  Job:  [id number]

## 2010-10-01 NOTE — Assessment & Plan Note (Signed)
OFFICE VISIT   Harden, Ulice E  DOB:  Sep 20, 1940                                       04/16/2010  [id number]#:[id number]   Patient is a 70 year old male patient with severe venous insufficiency  for many years to both lower extremities with severe skin changes,  hyperpigmentation, history of bleeding in the left leg, and chronic pain  in both legs which has returned today, having worn long-leg elastic  compression stockings (20 mm - 30 mm gradient) as well as trying  elevation of his legs and analgesics and has had no improvement in his  symptomatology.  The aching, throbbing, and burning discomfort in the  thigh and calf areas worsen as the day progresses.  He has no history of  stasis ulcers but has had significant bleeding from the left leg in the  past.  This was controlled by his wife with compression and required a  trip to the emergency department in the past.  He has no history of DVT  or thrombophlebitis.   On exam today, continues to have bulging varicosities in the distal  thigh and medial calf areas bilaterally with severe hyperpigmentation  and thickening and scaly discoloration of the skin with 3+ dorsalis  pedis pulses palpable.  He has gross reflux in both great saphenous  veins up to and including the saphenofemoral junction as well as the  right small saphenous vein.   This is definitely affecting his daily living, and I think we should  proceed with the following:  1.  Laser ablation of the right great  saphenous vein.  2.  Laser ablation of the left great saphenous vein.  3.  Laser ablation of the right small saphenous vein to be followed in 3  months by repeat examination to see if stab phlebectomies will be  necessary.  We will proceed in the near future with his initial  procedure.     [name], M.D.  Electronically Signed   JDL/MEDQ  D:  04/16/2010  T:  04/16/2010  Job:  [id number]

## 2010-10-01 NOTE — Procedures (Signed)
LOWER EXTREMITY VENOUS REFLUX EXAM   INDICATION:  Bilateral varicose veins.   EXAM:  Using color-flow imaging and pulse Doppler spectral analysis, the  bilateral common femoral, superficial femoral, popliteal, posterior  tibial, greater and lesser saphenous veins are evaluated.  There is  evidence suggesting deep venous insufficiency in the bilateral lower  extremities.   The bilateral saphenofemoral junctions are not competent with reflux of  >567milliseconds. The bilateral GSV's are not competent with reflux of  >521milliseconds with the caliber as described below.   The right proximal short saphenous vein demonstrates incompetency.   GSV Diameter (used if found to be incompetent only)                                            Right    Left  Proximal Greater Saphenous Vein           0.96 cm  1.07 cm  Proximal-to-mid-thigh                     cm       cm  Mid thigh                                 0.93 cm  1.01 cm  Mid-distal thigh                          cm       cm  Distal thigh                              0.68 cm  0.71 cm  Knee                                      0.74 cm  0.79 cm   IMPRESSION:  1. Bilateral greater saphenous veins are not competent with reflux      with >544milliseconds.  2. The bilateral greater saphenous veins are tortuous.  3. The deep venous system is not competent with reflux of      >580milliseconds.  4. The right short saphenous vein is not competent with reflux of      >580milliseconds.   ___________________________________________  [name], M.D.   CB/MEDQ  D:  01/04/2010  T:  01/04/2010  Job:  [id number]

## 2010-10-01 NOTE — Assessment & Plan Note (Signed)
OFFICE VISIT   Ball, Kurt E  DOB:  1940-12-22                                       05/06/2010  ZOXWR#:[id number]   Patient returns today, scheduled for a laser ablation of his left great  saphenous vein.  He is concerned about the right leg because there  continues to be some tenderness in the thigh.  He had laser ablation  performed on December 8 on the right side.   On examination, he does have some resolving ecchymosis and swelling in  the right thigh where the laser ablation was performed and a few  inflamed areas where the Xylocaine was injected in the thigh.  The  tenseness and swelling below the knee is significantly decreased from  preoperatively, and he continues to have a well-perfused right foot with  a palpable dorsalis pedis pulse.  The tenderness in the right thigh has  diminished since I examined him last week.  He had a duplex exam  performed last week which revealed closure of the vein and no evidence  of DVT.   He would like to wait until after the first of the year to have the left  leg done because of this discomfort, so we will reschedule him for the  next few weeks.   Today on examination, blood pressure 128/80, heart rate 72, respirations  24.  Findings in the thigh are as mentioned above.  He will be returning  after the first of the year for laser ablation of his left great  saphenous vein.     [name], M.D.  Electronically Signed   JDL/MEDQ  D:  05/06/2010  T:  05/06/2010  Job:  [id number]

## 2010-10-01 NOTE — Consult Note (Signed)
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  He had an episode of bleeding from varicosities a few weeks  ago in the left ankle area which he described as spurting blood coming  from the left ankle which was controlled by his wife with compression.  He also has had some skin breakdown in both ankle areas left greater  than right over the past several years.  Does not wear elastic  compression stockings and he has no history of DVT or thrombophlebitis.  He does have severe swelling in the ankles as the day progresses as well  as aching, throbbing and burning discomfort in both thighs and calf  areas.  He does not elevate the legs on a regular basis nor take pain  medicine.  He states that he had problems with his legs dating back to  [place] in the late 1960s where leeches were attached to the skin of  both lower extremities, but the bulging varicosities started many years  later.   CHRONIC MEDICAL PROBLEMS:  1. Hypertension.  2. History of rapid atrial fibrillation status post ablation by Dr.      Doylene Canning. [name] 3 years ago.  3. Negative for coronary artery disease, diabetes or stroke.   SOCIAL HISTORY:  The patient is married, has five children and is  retired.  Smokes occasional cigarettes and usually less than a quarter  of a pack per day and drinks five to six Trautmann per day.   FAMILY HISTORY:  Positive for coronary artery disease in his father.  Negative for diabetes and stroke.   REVIEW OF SYSTEMS:  Negative for chest pain, dyspnea on exertion, PND,  orthopnea.  No chronic bronchitis.  No musculoskeletal problems.  Most  symptoms related to his legs and skin.  All other systems in review of  systems are  negative.   PHYSICAL EXAM:  Vital signs:  Blood pressure 125/81, heart rate 77,  respirations 18.  General:  Well-developed, well-nourished male in no  apparent distress, alert and oriented x3.  HEENT:  Exam is normal for  age.  EOMs intact.  Lungs:  Clear to auscultation.  No rhonchi or  wheezing.  Cardiovascular:  Regular rhythm and no murmurs.  Carotid  pulses 3+, no bruits.  Abdomen:  Soft, nontender with no masses.  Musculoskeletal:  Exam is free of major deformities.  Neurologic:  Normal.  Skin:  Exam reveals the lower extremities to have 3+ femoral,  popliteal, dorsalis pedis pulses.  He has severe hyperpigmentation in  lower third of both legs in pretibial areas with some depigmentation of  the skin.  He has bulging varicosities in both legs extending down the  anterior thigh on the left medially below the knee and into the  pretibial region anteriorly.  On the right side it begins in the medial  thigh extending down into the medial calf as well as posteriorly in the  popliteal area.  A cluster of reticular and spider veins in the lateral  ankle area on the left where the  bleeding occurred.   Today I ordered bilateral venous duplex exam which I reviewed and  interpreted.  He has bilateral great saphenous reflux communicating with  these bulging varicosities and also has a right small saphenous reflux  communicating with bulging varicosities in the right calf.   We will treat him with warm long-leg elastic compression stockings (20  mm - 30 mm gradient) as well as elevation and ibuprofen.  He will return  in 3 months and if there has been no improvement I think he should have  the following:  1. Laser ablation of the right great saphenous vein.  2. Laser ablation of the left great saphenous vein.  3. Laser ablation of the right small saphenous veins.  4. He will likely require stab phlebectomies at a later date for      bulging varicosities in both legs.  5. He will be in  touch with [place] if he has any further bleeding in the      interim.     [name], M.D.  Electronically Signed   JDL/MEDQ  D:  01/07/2010  T:  01/08/2010  Job:  [id number]   cc:   Gwen Pounds, MD

## 2010-10-01 NOTE — Procedures (Signed)
DUPLEX DEEP VENOUS EXAM - LOWER EXTREMITY   INDICATION:  Followup 1 week right greater saphenous vein ELAS.   HISTORY:  Edema:  Yes.  Trauma/Surgery:  04/25/2010 right greater saphenous vein ELAS.  Pain:  Yes.  PE:  No.  Previous DVT:  No.  Anticoagulants:  No.  Other:   DUPLEX EXAM:                CFV   SFV   PopV  PTV    GSV                R  L  R  L  R  L  R   L  R  L  Thrombosis    o  o  o     o     o      +  Spontaneous   +  +  +     +     +      o  Phasic        +  +  +     +     +      o  Augmentation  +  +  +     +     +      o  Compressible  +  +  +     +     +      o  Competent     +  +  +     +     +      o   Legend:  + - yes  o - no  p - partial  D - decreased   IMPRESSION:  1. Right leg appears to be negative for deep venous thrombosis.  2. Right greater saphenous vein is thrombosed due to ELAS (endovenous      laser ablation saphenous).    _____________________________  [name], M.D.   NT/MEDQ  D:  04/30/2010  T:  04/30/2010  Job:  [id number]

## 2010-10-01 NOTE — Assessment & Plan Note (Signed)
[facility]                            CARDIOLOGY OFFICE NOTE   NAME:Solorzano, SHAMEEK NYQUIST                          MRN:          [id number]  DATE:07/18/2008                            DOB:          1940-10-20    Mr. Ertl is a gentleman who I last saw on December 23, 2006.  He has a  history of atrial flutter ablation with a mildly abnormal nuclear study  (nuclear study on May 18, 2006, suggest an ejection fraction of  52%.  There is a small area of apical ischemia).  We have been treating  him medically.  His last echocardiogram was performed on March 17, 2008.  His ejection fraction had normalized.  There was diastolic  dysfunction and there was flattening of the interventricular septum  during diastole.  There was biatrial enlargement.  Since I last saw him,  he has done reasonably well.  He denies any increased dyspnea on  exertion, orthopnea, PND, pedal edema, palpitations, presyncope, or  syncope.  He did state back in the fall, he was noticing diffuse  myalgias and a burning sensation in his chest when he exerted himself.  However, he attributed this to statins and his Lipitor was discontinued  and he has had no further problems.  He now does not have exertional  chest pain.   MEDICATIONS:  1. Aspirin 81 mg p.o. daily.  2. Metoprolol ER 50 mg p.o. daily.  3. Lasix 40 mg p.o. b.i.d.  4. Fish oil.  5. Nexium.  6. Lisinopril 5 mg p.o. daily.   PHYSICAL EXAMINATION:  VITAL SIGNS:  Today shows a blood pressure  124/77.  His pulse is 72.  He weighs 207 pounds.  HEENT:  Normal.  NECK:  Supple.  CHEST:  Clear.  CARDIOVASCULAR:  Regular rate and rhythm.  ABDOMEN:  No tenderness.  EXTREMITIES:  No edema.   His electrocardiogram shows a sinus rhythm with a first-degree AV block.  There is left axis deviation.  There is lateral T-wave inversion.   DIAGNOSES:  1. Chest pain - the patient's symptoms were somewhat atypical.  He      attributes them  to statins and they have resolved since      discontinuing them last fall.  However, he did have a previous      abnormal nuclear study.  We will plan to repeat this to see if it      has changed.  We could consider catheterization if that is much      different.  2. History of abnormal nuclear study - the patient's symptoms were      somewhat atypical.  He attributes them to statins and they have      resolved since discontinuing them last fall.  However, he did have      a previous abnormal nuclear study.  We will plan to repeat this to      see if it has changed.  We could consider catheterization if that      is much different.  3. History of atrial flutter status  post ablation - the patient      remains in sinus rhythm.  4. History of transient atrial fibrillation with rapid atrial pacing      during his EP procedure.  5. Hypertension - his blood pressure is adequately controlled on his      present medications.  Dr. Timothy Lasso is following his renal function.  6. Hyperlipidemia - the patient is not tolerating statins.  He will      continue on his fish oil.  7. Pedal edema - he will continue on his Lasix.  8. History of alcohol use - we discussed the importance of limiting      his alcohol use.  I will see him back in 12 months or sooner if      necessary.     Madolyn Frieze Jens Som, MD, [facility]  Electronically Signed    BSC/MedQ  DD: 07/18/2008  DT: 07/19/2008  Job #: [id number]   cc:   Gwen Pounds, MD

## 2010-10-01 NOTE — Assessment & Plan Note (Signed)
OFFICE VISIT   [name], [name]  DOB:  Jun 03, 1940                                       05/27/2010  WGNFA#:[id number]   Patient had laser ablation of his left great saphenous vein from the mid  thigh to saphenofemoral junction today without complication.   He tolerated the procedure well.  He will return in 1 week for a venous  duplex exam to confirm closure.  He will then be scheduled for a laser  ablation of his right small saphenous vein.     [name], M.D.  Electronically Signed   JDL/MEDQ  D:  05/27/2010  T:  05/27/2010  Job:  [id number]

## 2010-10-01 NOTE — Assessment & Plan Note (Signed)
[facility]                            CARDIOLOGY OFFICE NOTE   NAME:Eddleman, CORNELUIS ALLSTON                          MRN:          [id number]  DATE:10/29/2006                            DOB:          1940/12/26    Mr. Hedden returns for followup today. He has a history of atrial flutter  ablation and a mildly abnormal nuclear study. Note he did have a large  pericardial effusion following his procedure and his Coumadin was  discontinued. When I saw him on September 10, 2006 he was complaining of  increased weight and shortness of breath and we added Lasix. Since then  he is much better. Note we did repeat his echocardiogram on September 14, 2006 and his LV function is normal. There was no pericardial effusion.  The left atrium was mildly dilated. He now denies any dyspnea on  exertion, orthopnea, PND, palpitations, pre syncope, syncope, or chest  pain. His pedal edema has improved. His medications include;  1. Lisinopril 10 mg p.o. daily.  2. Enteric coated aspirin 81 mg p.o. daily.  3. Glucosamine.  4. Omega III.  5. Lasix 20 mg p.o. q.o.d.   PHYSICAL EXAMINATION:  Shows a blood pressure of 131/87, his pulse is  84. He weighs 196 pounds.  HEENT: Normal.  NECK: Supple.  CHEST: Clear.  CARDIOVASCULAR EXAM: Regular rate and rhythm.  ABDOMINAL EXAM: Benign.  EXTREMITIES: Show chronic skin changes and trace to 1 + edema.   DIAGNOSES:  1. History of atrial flutter, status post ablation- the patient      remains in normal sinus rhythm on exam.  2. History of transient atrial fibrillation with rapid atrial pacing      during the EP procedure.  3. Hypertension- his blood pressure is mildly increased today. I have      asked him to resume his Toprol at 50 mg p.o. daily.  4. Hyperlipidemia- the patient did not tolerate statins in the past      and we will continue with fish oil.  5. History of mildly abnormal nuclear study with apical ischemia. We      have discussed  the options today. Note he does not have chest pain      or shortness of breath. His previous study was low risk. The      options would be to proceed with catheterization versus continuing      medical therapy and risk factor modification with follow up study      in 1 year. We have elected to proceed with the later. He will      therefore need a follow up nuclear study in December of this year.  6. Pedal edema- we will  increase his Lasix to 20 mg p.o. daily. I      will check a BMET in 2 weeks.   We will see him back in 6 months.     Madolyn Frieze Jens Som, MD, [facility]  Electronically Signed    BSC/MedQ  DD: 10/29/2006  DT: 10/29/2006  Job #: [id number]   cc:   Jonny Ruiz  Bo Mcclintock, MD

## 2010-10-01 NOTE — Assessment & Plan Note (Signed)
[facility]                            CARDIOLOGY OFFICE NOTE   NAME:[name], [name]                          MRN:          [id number]  DATE:12/23/2006                            DOB:          [birth year]    [name] is a pleasant gentleman who has a history of atrial flutter  ablation and a mildly abnormal nuclear study. Since I last saw him, he  was in [place] and had an episode of palpitations. However, it was  unlike his previous atrial flutter. He felt that he may have become  anxious. I do have results from that area including hemoglobin that was  16.7, BUN and creatinine that were normal at 14 and 0.9, and a troponin  that was 0.05. An electrocardiogram was taken, and I have a copy of that  showed sinus rhythm at a rate of 114. There was left access deviation.  There were no significant ST changes. Since that time, he denies any  dyspnea on exertion, orthopnea, PND, palpitations, presyncope, syncope  or chest pain. His pedal edema has improved on diuretics.   His present medications include:  1. Lisinopril 10 mg p.o. daily.  2. Children's aspirin 81 mg p.o. daily.  3. Omega 3 fish oil.  4. Lasix 20 mg p.o. daily.  5. Toprol 50 mg p.o. daily.   PHYSICAL EXAMINATION:  Today shows a blood pressure of 133/87, and his  pulse is 84. He weighs 194 pounds.  His HEENT is normal.  His neck is supple.  His chest is clear.  His cardiovascular exam reveals a regular rate and rhythm.  His abdominal exam is benign.  Extremities show chronic skin changes and trace edema.   DIAGNOSES:  1. History of atrial flutter status post ablation - the patient      remains in normal sinus rhythm, and we will continue with his      Toprol and aspirin. It sounds that his palpitations in [place]      were related to sinus tachycardia.  2. History of transient atrial fibrillation with rapid atrial pacing      during his EP procedure.  3. Hypertension - his blood  pressure is well controlled on his present      medications.  4. Hyperlipidemia - the patient apparently is not tolerating statins,      and we will continue his fish oil. I will have the most recent      lipids forwarded to [place] from Dr. Ferd Hibbs office for our records.  5. History of mildly abnormal nuclear study with apical ischemia - he      has not had any chest pain, and his study is low risk. We will plan      to repeat his stress Myoview in December. We can consider cardiac      catheterization if he develops symptoms.  6. Pedal edema - he will continue on his Lasix.   We will see him back in 6 months.     Madolyn Frieze Jens Som, MD, [facility]  Electronically Signed  BSC/MedQ  DD: 12/23/2006  DT: 12/24/2006  Job #: [id number]   cc:   Gwen Pounds, MD

## 2010-10-01 NOTE — Assessment & Plan Note (Signed)
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  He tolerated the procedure well with less  discomfort than he experienced when we did the right side in December.  He has had some mild to moderate discomfort in the left thigh, which is  significantly improved with heat.  He does not take any ibuprofen.  He  states that the distal edema has improved in the left ankle.  He has  experienced no chest pain no dyspnea on exertion, PND, orthopnea or  hemoptysis.  He continues to have swelling in the right calf and ankle  now 5 weeks post laser ablation of the right great saphenous system.   On physical exam today, blood pressure 125/81, heart rate 62,  respirations 16.  Generally, he is a well-developed, well-nourished male  who is in no apparent distress, alert and oriented x3.  HEENT:  Exam is  normal for age.  EOMs intact.  Lungs:  Clear to auscultation.  No  rhonchi or wheezing.  Abdomen:  Soft, nontender, with no masses.  Lower  extremity exam reveals 3+ femoral, popliteal and dorsalis pedis pulses  palpable bilaterally.  There is mild to moderate discomfort along the  left great saphenous vein where the ablation was performed with chronic  distal edema and hyperpigmentation skin changes bilaterally.  He does  have a few bulging varicosities in the right posterior calf and 1 to 2-  plus distal edema in the right ankle.   Today I ordered a venous duplex exam, which I reviewed and interpreted.  He has no evidence of DVT and total closure of the left great saphenous  vein from the distal insertion site to the saphenofemoral junction.   He is reassured  regarding these findings and done quite well and will  return soon for laser ablation of the right small saphenous vein.  We  will then wait 3 months and determine if stab phlebectomies will be  necessary as a final procedure.     [name], M.D.  Electronically Signed   JDL/MEDQ  D:  06/03/2010  T:  06/04/2010  Job:  [id number]

## 2010-10-04 NOTE — Discharge Summary (Signed)
NAME:  [name], [name]                   ACCOUNT NO.:  [id number]   MEDICAL RECORD NO.:  [id number]          PATIENT TYPE:  INP   LOCATION:  2024                         FACILITY:  [facility]   PHYSICIAN:  Bettey Mare. [place], NPDATE OF BIRTH:  1941-01-08   DATE OF ADMISSION:  05/04/2006  DATE OF DISCHARGE:  05/05/2006                               DISCHARGE SUMMARY   PRIMARY CARDIOLOGIST:  Olga Millers, MD   PRIMARY PHYSICIAN:  [name], MD   PROCEDURES PERFORMED DURING THE HOSPITALIZATION:  None.   ALLERGIES:  SULFA.   PRIMARY DIAGNOSES:  Atrial flutter with rapid ventricular response,  asymptomatic, with conversion of IV diltiazem.   SECONDARY DIAGNOSES:  1. Hypertension.  2. Hyperlipidemia.  3. Gastroesophageal reflux disease.  4. Ethanol use.   HISTORY OF PRESENT ILLNESS:  This is a 70 year old Caucasian male who is  in her usual state of health with no prior cardiac history who has  walked approximately 3 to 4 miles a day during the week over the last 5  months.  The patient complains of increased heart rate while laying in  bed without complaints of chest pain or shortness of breath, presyncope,  or fainting.  The patient noticed that he felt his heart racing and  noticed that it had been irregular, but he thought it was coming and  going.  Last night, he could not walk his usual walking regimen  secondary to increased palpitations, racing heart, and he presented to  the emergency room for further evaluation.   HOSPITAL COURSE:  The patient presented to the emergency room, was seen  and examined by Ward Givens, nurse practitioner, and Dr. Olga Millers.  The patient's blood pressure was 162/88, pulse was 163, atrial flutter  was rapid ventricular response with a respirations for 20.  The patient  was started on IV diltiazem and Coumadin along with Lovenox.  The  patient was tentatively scheduled for a TEE cardioversion to follow up  in the morning.   Dr. Sherryl Manges  was consulted, EP specialist, concerning his atrial  flutter with RVR.  The patient was seen and examined by Dr. Graciela Husbands on  May 04, 2006, and plan for TEE cardioversion to follow in the  morning.   In the early morning hours of May 05, 2006, the patient converted  to normal sinus rhythm on diltiazem drip.  The patient also had a 3-  second pause noted at approximately 1 a.m.  The patient was asymptomatic  with this rhythm and remained in normal sinus rhythm thereafter.  The  patient remained on diltiazem drip at 10 mg an hour except the following  morning when he was seen and evaluated again.   Diltiazem drip was discontinued, and he was started on 120 mg of  Cardizem p.o.  TEE cardioversion was cancelled, and the patient is to  follow up with Dr. Graciela Husbands for ablation as an outpatient.  The patient was  also talked to by Dr. Jens Som concerning need for outpatient cardiac  stress test secondary to abnormal EKG which was seen the morning  following  his conversion to normal sinus rhythm.  EKG revealed T-wave  inversion in leads V3, V4, V5, and V6 with evidence of anterolateral  ischemia.   On routine labs, the patient was also found to have evidence of  hypercholesterolemia with a cholesterol of 216, LDL of 126, HDL was 44  with triglycerides of 228.  The patient was subsequently started on  Lipitor during hospitalization.  The patient's TSH was ordered, and  results were pending at the time of discharge.  Free T4 well within  normal limits.  The patient was continued on Coumadin and will be seen  in the Coumadin clinic post discharge for evaluation of dosage and  bleeding time with subsequently outpatient ablation to be scheduled  after Cardiolite stress test.   LABORATORY ON DISCHARGE:  Hemoglobin 16.7, hematocrit 48.0, white blood  cells 6.4, platelets 169.  Sodium 134, potassium 4.1, chloride 100, CO2  of 27, BUN 17, creatinine 0.7, glucose 112, PT is 14.1, INR is 1.1.   Cholesterol as described above.   WEIGHT ON DISCHARGE:  84.8 kg.   VITAL SIGNS ON DISCHARGE:  Blood pressure 111/70, heart rate 56,  respirations 20, 99% saturation on room air.   FOLLOWUP APPOINTMENTS AND INSTRUCTIONS:  1. The patient is scheduled for [facility] with Shelby Dubin      on December 26 at 8 a.m.  The patient has been given instructions      on dietary restrictions for dark green leafy vegetables with      followup instructions per Ms. Jimmey Ralph on that visit.  2. The patient is scheduled for application of Myoview stress test on      December 31 at 11:45 a.m.  The patient has been given written      instructions concerning this test preparation, for the test to      include no caffeine in the morning at breakfast prior to coming to      the office.  3. The patient is scheduled to see Dr. Jens Som on January 28 at 11:45      a.m. for a 6-week followup.  4. The patient is also scheduled to see Dr. Graciela Husbands for ablation.      Amber, Dr. Odessa Fleming nurse, is to call the patient at home for a      followup appointment to be scheduled after stress test is      completed.   DISCHARGE MEDICATIONS:  1. Lipitor 20 mg at bedtime (new prescription).  2. Cardizem CD 120 mg once a day (new prescription).  3. Coumadin 7.5 mg once a day (new prescription).  4. Lisinopril 10 mg once a day.  5. Enteric-coated aspirin 81 mg once a day.   DURATION OF TIME MET WITH PATIENT TO INCLUDE PHYSICIAN TIME:  40  minutes.      Bettey Mare. Lyman Bishop, NP    KML/MEDQ  D:  05/05/2006  T:  05/06/2006  Job:  [id number]   cc:   Duke Salvia, MD, [facility]  Gwen Pounds, MD

## 2010-10-04 NOTE — Assessment & Plan Note (Signed)
[facility]                            CARDIOLOGY OFFICE NOTE   NAME:Dibenedetto, NICOLAI LABONTE                          MRN:          [id number]  DATE:05/27/2006                            DOB:          24-Aug-1940    Mr. Lords was recently admitted to [facility] with symptomatic  atrial flutter.  He converted with Cardizem and was discharged for an  outpatient ablation.  However, he developed recurrent atrial flutter and  was readmitted.  He subsequently underwent atrial flutter ablation.  This was performed by Dr. Ladona Ridgel on May 08, 2006.  Note that he did  have atrial fibrillation during rapid atrial pacing that terminated with  IV Lopressor.  Since then, he has done well.  There is no dyspnea, chest  pain, palpitations or syncope.  Note:  We did perform a Myoview on the  patient on May 18, 2006.  His ejection fraction was 52%.  There was  a small area of apical ischemia.  There was also left ventricular  dilatation.  Note his LDL was 126 when he was in the hospital.   PRESENT MEDICATIONS INCLUDE:  1. Coumadin.  2. Toprol 50 mg p.o. daily.  3. Prilosec 20 mg p.o. daily.  4. Lisinopril 10 mg p.o. daily.  5. Aspirin 81 mg p.o. daily.   PHYSICAL EXAMINATION:  His physical exam today shows a blood pressure of  144/87, and his pulse is 65.  He weighs 194 pounds.  NECK:  Supple.  CHEST:  Clear.  CARDIOVASCULAR EXAM:  Reveals a regular rate and rhythm.  EXTREMITIES:  Show no edema.   DIAGNOSES:  1. Atrial flutter, status post ablation.  2. Transient atrial fibrillation with rapid atrial pacing during the      electrophysiologic procedure.  3. Hypertension.  4. Mild hyperlipidemia.  5. Gastroesophageal reflux disease.  6. History of alcohol use.  7. Coumadin therapy.  8. Mildly abnormal nuclear study.   PLAN:  Mr. Agresta is doing well from symptomatic standpoint and remains  in sinus rhythm on exam.  We will check an electrocardiogram to  verify  this.  I will have him return in four weeks and we will most likely  discontinue his Coumadin at that time, if he remains in sinus rhythm.  If he develops atrial fibrillation in the future, then we could consider  flecainide therapy.  Note:  His nuclear study shows mild apical  ischemia.  We discussed the options today and we will most likely  proceed with a cardiac catheterization after he completes his Coumadin.  We will discuss  that at our next office visit.  We did add Lipitor when he was in the  hospital, but this apparently caused myalgias.  I will try Zocor 20 mg  p.o. q.h.s. and check lipids and liver in six weeks.     Madolyn Frieze Jens Som, MD, [facility]  Electronically Signed    BSC/MedQ  DD: 05/27/2006  DT: 05/27/2006  Job #: [id number]

## 2010-10-04 NOTE — Assessment & Plan Note (Signed)
[facility]                            CARDIOLOGY OFFICE NOTE   NAME:[name], [name]                          MRN:          [id number]  DATE:09/10/2006                            DOB:          [birth year]    Mr. Graven is a pleasant gentleman who has a history of atrial flutter  ablation.  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  He also had a nuclear study  that showed an ejection fraction of 52%.  There was a small area of  apical ischemia and we have been treating that medically.  The patient  states that he recently has noticed increased shortness of breath and  weight gain.  There is minimal pedal edema.  There is no orthopnea or  PND and there is no chest pain or palpitations and there is no syncope.   MEDICATIONS:  1. Toprol 50 mg p.o. daily.  2. Prilosec 20 mg p.o. daily.  3. Lisinopril 10 mg p.o. daily.  4. Aspirin 81 mg p.o. daily.  5. Glucosamine.  6. Omega Fish Oil.  Dr. Timothy Lasso also started him on Lasix 40 mg p.o. daily but he has not  taken it as he states it may have made him sick.  However, it also may  have been something he ate recently.   PHYSICAL EXAMINATION:  Shows a blood pressure of 132/84.  His pulse is  86.  NECK:  Is supple with no JVD.  CHEST:  Is clear.  CARDIOVASCULAR:  Exam is regular rate and rhythm.  EXTREMITIES:  Showed trace edema.   Electrocardiogram shows a sinus rhythm at a rate of 86.  There is left  axis deviation.  There is lateral T-wave inversion.   DIAGNOSES:  1. Dyspnea on exertion/weight gain - we will check an echocardiogram      to reassess his left ventricular function and also to make sure      that his pericardial effusion has not worsened.  I will add Lasix      20 mg p.o. daily.  We will check a BMET in approximately 5 days as      well as a BNP.  I will have him return in 4 to 6 weeks.  2. History of atrial flutter, status post  ablation - the patient      remains in normal sinus rhythm.  3. Transient atrial fibrillation with rapid atrial pacing during the      electrophysiologic procedure.  4. Hypertension - blood pressure well controlled on present      medications.  5. Hyperlipidemia - the patient did not tolerate statins and he will      continue on his fish oil.  6. Mildly abnormal nuclear study - we will continue with his aspirin,      beta blocker and ACE      inhibitor. When I see him back we will discuss whether we should      proceed with cardiac catheterization versus repeating his Myoview      in  the future.     Madolyn Frieze Jens Som, MD, [facility]  Electronically Signed    BSC/MedQ  DD: 09/10/2006  DT: 09/10/2006  Job #: [id number]   cc:   Gwen Pounds, MD

## 2010-10-04 NOTE — Op Note (Signed)
NAME:  [name], [name]                             ACCOUNT NO.:  [id number]   MEDICAL RECORD NO.:  [id number]                   PATIENT TYPE:  AMB   LOCATION:  DAY                                  FACILITY:  [facility]   PHYSICIAN:  Leonie Man, M.D.                DATE OF BIRTH:  12-10-1940   DATE OF PROCEDURE:  11/02/2003  DATE OF DISCHARGE:                                 OPERATIVE REPORT   PREOPERATIVE DIAGNOSIS:  Umbilical hernia.   POSTOPERATIVE DIAGNOSIS:  Umbilical hernia.   PROCEDURE:  Repair of umbilical hernia.   SURGEON:  Leonie Man, M.D.   ASSISTANT:  Nurse.   ANESTHESIA:  General.   OPERATIVE NOTE:  [name] is a 70 year old man who presented to the  emergency room with an incarcerated umbilical hernia.  On the day prior to  his surgery, while he was in the emergency room, the hernia reduced.  He has  elected to come back electively today for repair of his umbilical hernia.   DESCRIPTION OF PROCEDURE:  Following the induction of satisfactory general  anesthesia, the patient is positioned supinely on the operating room table  and prepped and draped routinely.  An infraumbilical incision is made and  deepened through the skin and subcutaneous tissues; elevated the umbilical  skin as a flap and dissecting away from it, the large umbilical hernial sac.  Actually it was carried down to the fascia, the sac was opened.  There were  no contents within the sac, the sac was amputated and secured with ties of 2-  0 silk.  The defect was relatively small, and without any tension.  So I  elected to repair this with interrupted, inverted mattress sutures of 0  Novofil.   The ends of the repair was intact.  Subcutaneous tissues were closed over  the repair with a running 2-0 Vicryl suture.  The skin was closed with  running 4-0 Monocryl secure, and reinforced with Steri-Strips.  A sterile  compressive dressing applied.  Anesthetic reversed.   The patient removed from the  operating room to the recovery room in stable  condition.  He tolerated the procedure well.                                               Leonie Man, M.D.    PB/MEDQ  D:  11/02/2003  T:  11/02/2003  Job:  [id number]
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
## 2010-10-04 NOTE — H&P (Signed)
NAME:  [name], [name]                   ACCOUNT NO.:  [id number]   MEDICAL RECORD NO.:  [id number]          PATIENT TYPE:  INP   LOCATION:  2039                         FACILITY:  [facility]   PHYSICIAN:  Garrison Columbus. Haithcock, MDDATE OF BIRTH:  1940/07/27   DATE OF ADMISSION:  05/06/2006  DATE OF DISCHARGE:                              HISTORY & PHYSICAL   CHIEF COMPLAINT:  Palpitations.   HISTORY OF PRESENT ILLNESS:  A 70 year old man who returns today after  being discharged from this service several hours earlier.  His was  initially hospitalized for atrial fibrillation with rapid ventricular  response on May 02, 2006.  He was rate controlled with diltiazem.  During his hospitalization his predominate rhythm was atrial flutter  with occasional episodes of atrial fibrillation.  He was seen by  electrophysiology and was to follow up with them as an outpatient for  consideration of radio-frequency ablation of atrial flutter.  He was  discharged on long-acting diltiazem, Coumadin, and a ACE inhibitor, as  well as aspirin.  He had been at home for a couple of hours and had a  few Enrique when he noted a pounding in his chest similar to the prior  presentation.  In the emergency room he received IV fluids as well as IV  diltiazem.   PAST MEDICAL HISTORY:  1. Atrial flutter with rapid ventricular response.  2. Hypertension.  3. Alcoholism.  4. Chest pain with negative markers in the past and a negative stress      test.   ALLERGIES:  SULFA.   MEDICATIONS:  1. Lipitor 20 mg h.s.  2. Cardizem CD 120 mg daily.  3. Coumadin 7.5 mg daily.  4. Lisinopril 10 mg daily.  5. Aspirin 81 mg daily.   SOCIAL HISTORY:  Patient lives with his wife, has 5 children, has 3 to 4  Derrig daily, and as much as a 12 pack on the weekends.   FAMILY HISTORY:  Father had coronary artery disease status post CABG.   REVIEW OF SYSTEMS:  Is otherwise negative.   PHYSICAL EXAMINATION:  VITAL SIGNS:  Temperature  98.7, pulse 120,  respiratory rate 18, blood pressure 120/74, pulse ox 96% on room air.  GENERAL:  He is no apparent distress.  He is pleasant.  HEENT:  Normocephalic, atraumatic.  Pupils are equal, round, reactive to  light and accommodation.  Extraocular motions are intact.  NECK:  Supple, no lymphadenopathy.  CARDIOVASCULAR:  Shows irregularly irregular with no murmurs, rubs, or  gallops.  PMI is normal.  Pulses are 2+ and equal.  LUNGS:  Clear to auscultation.  SKIN:  No lesions.  ABDOMEN:  Soft, nontender, and positive bowel sounds.  EXTREMITIES:  No clubbing, cyanosis, or edema.  NEUROLOGICAL:  Exam is grossly nonfocal.   LABORATORY TESTS:  CHEST X-RAY:  Pending.   EKG:  Two EKGs were performed in the emergency room.  The first EKG was  at 2135, which shows what appears to be atrial flutter with a variable  block with ventricular response at 150 beats per minute.  The second  EKG  perhaps shows atrial fibrillation, although the baseline is confounded  by patient movement causing artifact on the EKG.   White count 4.2, hematocrit 42, platelets 142.  INR 1.1.   ASSESSMENT:  This is a 70 year old male with supraventricular  tachycardia.  Apparently prior EKGs suggested atrial flutter.  His most  recent EKG may suggest atrial fibrillation.  This is confounded by  baseline artifact.  Likely flutter is the patient's predominant rhythm  degenerating into occasional fib.  We will admit for consideration and  further management of his rhythm as well as optimizing his rate control  regimen.      Daniel B. Haithcock, MD     DBH/MEDQ  D:  05/06/2006  T:  05/07/2006  Job:  [id number]

## 2010-10-04 NOTE — H&P (Signed)
NAME:  [name], [name]                   ACCOUNT NO.:  [id number]   MEDICAL RECORD NO.:  [id number]          PATIENT TYPE:  INP   LOCATION:  2024                         FACILITY:  [facility]   PHYSICIAN:  Madolyn Frieze. Jens Som, MD, FACCDATE OF BIRTH:  [birth year]   DATE OF ADMISSION:  05/04/2006  DATE OF DISCHARGE:                              HISTORY & PHYSICAL   PRIMARY CARE PHYSICIAN:  Dr. Timothy Lasso.   PRIMARY CARDIOLOGIST:  Patient is new to [facility], being seen  by Dr. Olga Millers.   PATIENT PROFILE:  A 70 year old married white male with a prior history  of hypertension who presents with a 3- to 4-day history of tachy  palpitations without associated symptoms.   PROBLEM LIST:  1. Tachy palpitations/atrial flutter with rapid ventricular response.  2. Hypertension.  3. Gastroesophageal reflux disease.  4. Status post umbilical hernia repair in June of 2005.  5. History of pleuritic chest pain approximately 4 years ago,      evaluated at [facility] with a negative exercise Myoview,      and he was told that the pain was secondary to strained muscles.   HISTORY OF PRESENT ILLNESS:  A 70 year old married white male with no  prior cardiac history.  He is very active, walking 3 miles daily during  the week and 5 miles on the weekends with hand weights, without  limitations.  Approximately 3-4 nights ago, he noted an increased heart  rate while lying in bed without chest pain, shortness of breath,  presyncope, fatigue, or other associated symptoms.  They think that his  tachycardia has been intermittent, although cannot be sure, noticing it  mostly at night when things are quiet and he is relaxing.  Despite  intermittent tachy palpitations, he has continued to walk between 3 and  5 miles using hand weights without any limitations, easy fatigability,  or decrease in exercise tolerance.  Because of the persistence of the  tachy palpitations, he saw his primary care physician  this morning, and  an EKG was performed, revealing atrial flutter with RVR, rates into the  160s, and EMS was called, and the patient was taken to the [facility] ED  for evaluation.  Since being here, he has received 20 mg of IV diltiazem  bolus x2 with heart rate now down into the 80s, although earlier, he was  between 105 to 165.  He remains asymptomatic and, again, denies any  chest pain, shortness of breath, PND, orthopnea, dizziness, syncope,  edema, or early satiety.   ALLERGIES:  SULFA.   HOME MEDICATIONS:  1. Lisinopril 10 mg daily.  2. Prilosec 20 mg daily.  3. Aspirin 325 mg daily for the past 3 or 4 days.  4. Here in the ER, he received diltiazem 20 mg IV bolus x2, then      diltiazem drip at 10 mg an hour.   FAMILY HISTORY:  Mother died of a CVA postoperatively at age 24.  Father  died at age 58 with a history of CAD, CABG, and rheumatic heart disease.  He has  7 brothers and sisters.  All are alive and well.  There is no  history of CAD, CVA, or diabetes.   SOCIAL HISTORY:  Lives in [place] with his wife.  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  He is married and has 5  children with 15 grandchildren.  He says he experimented with cigarettes  in the past but never really got hooked.  He drinks 3-4 Saephan per night  and as many as 10-15 on a [employer] race day.  Denies any drug use.  He  walks daily with hand weights, between 3 and 5 miles.   REVIEW OF SYSTEMS:  Positive for 1 episode of sharp, shooting upper  chest pain that lasted just a few seconds and resolved spontaneously.  Also notable for tachy palpitations as described in the HPI.  He denies  any history of bright red blood per rectum or melena.  All other systems  reviewed negative.   PHYSICAL EXAMINATION:  Temperature 98.4.  Heart rate is current 92 beats  per minute, respirations 20.  Blood pressure is 162/88, pulse ox 98% on  2 L.  A pleasant white male in no acute distress, awake,  alert,  and oriented  x3.  NECK:  Normal carotid upstrokes, no bruits, JVD.  LUNGS:  Respirations regular and unlabored.  Clear to auscultation.  CARDIAC:  Regular, tachycardic, S1 and S2.  No S3 or S4 murmurs.  ABDOMEN:  Round, soft, nontender, nondistended.  Bowel sounds present  x4.  EXTREMITIES:  Warm, dry, and pink.  No cyanosis, clubbing, or edema.  Dorsalis pedis, posterior tibial pulses 2+ and equal bilaterally.  HEENT:  Atraumatic and normocephalic.  Neuro is grossly intact and nonfocal.  Skin is warm and dry without lesions or masses.   Chest x-ray pending.  EKG initial showed A flutter with a left axis and  a rate of 160 beats per minute, and following a second diltiazem 20-mg  IV bolus, rate is 92 beats per minute with a sawtooth pattern.   LAB WORK:  Hemoglobin 16.7, hematocrit 48.0, WBC 6.4, platelets 169.  Sodium 135, potassium 4.5, chloride 103, CO2 29.4, BUN 19, creatinine  0.9, glucose 112.  CPK-MB 1.7, troponin I less than 0.5.   ASSESSMENT AND PLAN:  1. Atrial flutter with rapid ventricular response:  The patient is      relatively asymptomatic and is currently comfortable.  Question      duration of A flutter, as he thinks it is intermittent over the      past 72 hours or more but is not sure about that.  He has just      received a second bolus of IV diltiazem at 20 mg with the heart      rate coming down to 80s to 90s.  Plan to initiate diltiazem      infusion.  Will add Lovenox and discuss his case with      electrophysiology for probable A flutter ablation.  Check pulmonary      function tests and a magnesium.  Electrolytes are otherwise normal.      Check 2-D echocardiogram.  Coumadin per pharmacy.  2. Hypertension:  This is currently controlled.  Taking lisinopril 10      mg daily, and at this point, he will be on diltiazem at least to      the short term. 3. Gastroesophageal reflux disease:  No complaints.  Continue PPI.  4. Lipid status currently  unknown.  Check lipids and LFTs.  5. ETOH use:  Patient drinks several [place] at night and more so on      weekends.  Cessation advised.      Nicolasa Ducking, ANP      Madolyn Frieze. Jens Som, MD, [facility]  Electronically Signed    CB/MEDQ  D:  05/04/2006  T:  05/05/2006  Job:  [id number]

## 2010-10-04 NOTE — Discharge Summary (Signed)
NAME:  [name], [name]                   ACCOUNT NO.:  [id number]   MEDICAL RECORD NO.:  [id number]          PATIENT TYPE:  INP   LOCATION:  2039                         FACILITY:  [facility]   PHYSICIAN:  Madolyn Frieze. Jens Som, MD, FACCDATE OF BIRTH:  [birth year]   DATE OF ADMISSION:  05/06/2006  DATE OF DISCHARGE:  05/09/2006                               DISCHARGE SUMMARY   REASON FOR ADMISSION:  Palpitations.   DISCHARGE DIAGNOSES:  1. Atrial fibrillation/flutter, status post radiofrequency catheter      ablation this admission with Dr. Ladona Ridgel for his atrial flutter.  2. Coumadin therapy.  3. Hypertension.  4. Hyperlipidemia.  5. Gastroesophageal reflux disease.  6. History of heavy alcohol use   PROCEDURE THIS ADMISSION:  Radiofrequency catheter ablation by Dr. Lewayne Bunting May 08, 2006. Atrial flutter terminated.  Atrial  fibrillation induced in the EP lab.   HISTORY:  [name] is a 70 year old male patient who had recently been  admitted under the care of Dr. Jens Som with atrial fibrillation with  rapid ventricular rate.  He was placed on rate controlling medication of  Coumadin.  He was at home for a couple hours and had a few [place] and  then represented back to the hospital with palpitations.  He was noted  to be an atrial fibrillation with rapid ventricular rate.  Atrial  flutter was also noted on his admission EKG.  He was admitted for  further evaluation and therapy.   HOSPITAL COURSE:  The patient was placed on IV diltiazem.  He was set up  for EP procedure with Dr. Ladona Ridgel on May 08, 2006, with the results  as noted above.  His atrial flutter was terminated.  Atrial fibrillation  was induced in the EP lab.  On the morning of May 09, 2006, Dr.  Jens Som saw the patient.  He remained in sinus rhythm.  His groin site  was without hematoma.  He was felt stable enough for discharge to home.  Diltiazem was discontinued as well as his ACE inhibitor.  He was placed  on rate controlling medications with Toprol XL 50 mg a day.  His  Coumadin was adjusted to 5 mg a day.  He was kept on his Lipitor.  Aspirin was stopped.  He already has followup for Coumadin clinic  outpatient Myoview test and followup with Dr. Jens Som.  He will keep  those appointments.   LABORATORY AND ANCILLARY DATA:  On admission, hemoglobin 15.2,  hematocrit 43.6, white count 5100.  He platelet count 135,000.  INR at  discharge 1.4. Admission sodium 132, potassium 3.4, glucose 138, BUN 13,  creatinine 0.8, total bilirubin 0.6, alkaline phosphatase 66, AST 20,  ALT 25, total protein 6.4, albumin 3.5, calcium 8.7, magnesium 2.1.  Cardiac markers negative x3.  BNP 43. Total cholesterol 188,  triglycerides 96, HDL 43, LDL 126.  TSH 1.455.   Chest x-ray on admission: No active cardiopulmonary disease.   DISCHARGE MEDICATIONS:  1. Coumadin 5 mg daily.  2. Toprol XL 50 mg daily.  3. Lipitor 20 mg nightly   ACTIVITIES:  Increase his activity slowly.   DIET:  Low-sodium diet and decrease his alcohol intake.   WOUND CARE:  He should call for any groin swelling, bleeding, bruising  or fever.   FOLLOW UP:  1. He will see the Coumadin clinic on May 13, 2006, at 8 a.m.  2. He will undergo stress test May 18, 2006,  at 11:45 a.m.  3. He will see Dr. Jens Som June 15, 2006, at 11:45 a.m.   The patient has been advised to stop taking Cardizem, lisinopril, and  aspirin.   Total physician and PA time greater than 30 minutes for discharge.      Tereso Newcomer, PA-C      Madolyn Frieze. Jens Som, MD, [facility]  Electronically Signed    SW/MEDQ  D:  05/09/2006  T:  05/10/2006  Job:  [id number]

## 2010-10-04 NOTE — Assessment & Plan Note (Signed)
[facility]                         ELECTROPHYSIOLOGY OFFICE NOTE   NAME:Reicks, Martine                            MRN:          [id number]  DATE:06/09/2006                            DOB:          [birth year]    Mr. Kunzman returns today for followup.  He is a very pleasant middle-aged  man who underwent electrophysiologic testing and catheter ablation of  atrial flutter several weeks ago.  He has been on Coumadin.  His  procedure was well tolerated and without complication.  The patient was  here today and had a 2-D echocardiogram which had previously been  scheduled which demonstrated a fairly large pericardial effusion.  I  have not reviewed this findings but apparently there was no evidence of  tamponade physiology on the echocardiogram.  The patient is  asymptomatic.  He has gone back to work.  His only complaint is that he  feels some abdominal fullness from time to time but this has recently  improved.  He is on Coumadin.  He has had no bleeding problems.  He  denies peripheral edema.   PHYSICAL EXAMINATION:  GENERAL:  He is a pleasant, well-appearing middle-  aged man in no acute distress.  VITAL SIGNS:  The blood pressure today was 130/85, the pulse was 95 and  regular, the respirations were 18, the weight was 190 pounds.  NECK:  Revealed no jugular venous distention.  LUNGS:  Clear bilaterally to auscultation.  There were no wheezes,  rales, or rhonchi.  CARDIOVASCULAR:  Revealed a regular rate and rhythm with normal S1 and  S2.  There were no murmurs, rubs or gallops present.  Of note, I  personally obtained his blood pressure and found no pulsus paradoxus.  The neck veins were not elevated.  EXTREMITIES:  Demonstrated no cyanosis, clubbing or edema.   EKG demonstrated sinus rhythm, normal axis and intervals.  QRS alternans  was not present.   IMPRESSION:  1. Atrial flutter status post catheter ablation.  2. Chronic Coumadin therapy.  3.  Pericardial effusion (asymptomatic).  4. History of alcohol abuse.   DISCUSSION:  Mr. Youngblood is stable.  The etiology of his effusion is  unclear.  Because he is now over 3 weeks out from his ablation, I have  asked that he stop his Coumadin therapy.  He will follow back up with  Dr. Jens Som in a couple of weeks.  If the patient develops chest pain,  shortness of breath, or syncope or any other unusual symptoms, he is  instructed to call the office for additional evaluation and treatment or  go to the emergency room.     [name], MD  Electronically Signed    GWT/MedQ  DD: 06/09/2006  DT: 06/09/2006  Job #: [id number]

## 2010-10-04 NOTE — Assessment & Plan Note (Signed)
[facility]                         ELECTROPHYSIOLOGY OFFICE NOTE   NAME:[name], [name]                          MRN:          [id number]  DATE:06/29/2006                            DOB:          April 19, 1941    [name] returns today for followup.  He is a very pleasant middle-aged  man with a history of atrial flutter who underwent catheter ablation  several months ago now and then developed some dyspnea and by echo was  found to have a fairly moderate to large sized pericardial effusion  despite no evidence of tamponade physiology.  I saw him back in January  and he was otherwise stable.  Since then he has improved.  He says his  dyspnea has gone away completely.  He has had no dizzy spells.  He  otherwise has been asymptomatic without chest pain or shortness of  breath.   EXAM:  He is a pleasant, well-appearing, middle-aged man in no distress.  Blood pressure was 120/80, the pulse 77 and regular, respirations were  18, the weight was 188 pounds.  NECK:  No jugular venous distention.  LUNGS:  Clear bilaterally to auscultation.  CARDIOVASCULAR:  A regular rate and rhythm with normal S1 and S2.  There  were no murmurs, rubs or gallops present.  ABDOMINAL:  Obese.  EXTREMITIES:  No cyanosis, clubbing or edema.   The EKG demonstrates sinus rhythm with left atrial enlargement.  There  was nonspecific T wave abnormality.   IMPRESSION:  1. Atrial flutter status post ablation.  2. Pericardial effusion.  3. History of hypertension.   DISCUSSION:  Overall, [name] is stable.  He has had no symptoms of  his pericardial effusion since we stopped his Coumadin.  He will  maintain low dose aspirin.  He has asked about discontinuing his Toprol  and his blood pressure is normal and I have asked that he take himself  down to a half tablet daily for a month and then stop it all together.  The patient does check his heart rate and blood pressure every day and  if his heart rate goes up or his blood pressure goes up he is instructed  to restart his Toprol.  Will see him back in 4 months, but if he is  doing okay at that time will ask that he cancel that appointment and see  him back as needed.     [name], MD  Electronically Signed    GWT/MedQ  DD: 06/29/2006  DT: 06/29/2006  Job #: [id number]   cc:   Gwen Pounds, MD

## 2010-10-04 NOTE — Op Note (Signed)
NAME:  [name], [name]                   ACCOUNT NO.:  [id number]   MEDICAL RECORD NO.:  [id number]          PATIENT TYPE:  INP   LOCATION:  2039                         FACILITY:  [facility]   PHYSICIAN:  [name], MD    DATE OF BIRTH:  [birth year]   DATE OF PROCEDURE:  05/08/2006  DATE OF DISCHARGE:                               OPERATIVE REPORT   PROCEDURE PERFORMED:  Electrophysiologic study and radio frequency  catheter ablation of atrial flutter.   INTRODUCTION:  The patient is a 64-year male with a history of recurrent  symptomatic atrial flutter who was admitted to hospital with the above.  He would go in and out of flutter.  The patient is now referred for  ablation.   PROCEDURE:  After informed consent was obtained, the patient was taken  to the diagnostic EP lab in a fasting state.  After the usual  preparation and draping, intravenous fentanyl and midazolam was given  for sedation.  A 6-French hexapolar catheter was inserted percutaneously  into the right jugular vein and advanced to the coronary sinus.  A 7-  French 20 polar halo catheter was inserted percutaneously in the right  femoral vein and advanced to the right atrium.  A 5-French quadripolar  catheter was inserted percutaneously in the right femoral vein and  advanced to the His bundle region.  Measurement of the basic intervals.  Mapping was carried out which demonstrated typical counter clockwise  tricuspid annular re-entrant atrial flutter at a cycle length of 212  milliseconds.  There was 2:1 AV conduction.  The 7-French quadripolar  ablation catheter was then inserted into the right femoral vein and  advanced into the right atrium.  Mapping was carried out along tricuspid  annular isthmus.  This confirmed the finding of typical counter  clockwise atrial flutter.  Five RF energy applications were delivered at  the usual atrial flutter isthmus.  During the first RF energy  application, atrial flutter was  terminated and sinus rhythm restored.  Isthmus conduction was still present.  A second RF energy application  was delivered which demonstrated isthmus block.  Three bonus RF energy  applications were then delivered and the patient was observed.  Next,  rapid ventricular pacing was carried out from the RV apex through the  ablation catheter at  600 milliseconds and stepwise decreased down to  380 milliseconds where VA Wenckebach was observed.  During rapid  ventricular pacing, the atrial activation sequence was midline  decremental.  Next, programmed ventricular stimulation was carried out  from the RV apex at base drive cycle length of 161 milliseconds.  The S1-  S2 interval was stepwise decreased down to 420 milliseconds where the  retrograde AV node ERP was observed.  During programmed ventricular  stimulation, the atrial activation was again midline decremental.  Rapid  atrial pacing was then carried out from the coronary sinus and the high  right atrium at a base drive cycle length of 096 milliseconds and  stepwise decreased down to 390 milliseconds where AV Wenckebach was  observed.  During rapid atrial  pacing, the PR interval was less than the  RR interval and there was no inducible SVT.  It should be noted that  during rapid atrial pacing, the patient following ablation, did have  pacing induced atrial fibrillation which would transition into a left  atrial flutter based on the findings of earliest atrial activation on  the left lateral wall of the left atrium.  This would terminate  spontaneously then reinitiate and it was terminated completely with IV  Lopressor.  Programmed atrial stimulation was also carried out at a base  drive cycle length of 161 milliseconds and the S1-S2 interval was  stepwise decreased from 540 milliseconds down to 280 milliseconds where  the AV node ERP was observed.  During probing stimulation, there no AH  jumps and no echo beats.  There was also  spontaneous occurring atrial  fibrillation.  At this point, the patient was observed for 30 minutes  and had no recurrent atrial flutter isthmus conduction.  The catheter  was then removed, hemostasis assured, and the patient returned to his  room in satisfactory condition.   COMPLICATIONS:  There were no immediate procedure complications.   RESULTS:  A.  Baseline ECG: The baseline ECG demonstrates atrial flutter  with a rapid ventricular response.  B.  Baseline intervals: The HV interval was 40 milliseconds.  The QRS  duration was 106 milliseconds.  The atrial flutter cycle length was 212  milliseconds.  C.  Rapid ventricular pacing: Rapid ventricular pacing demonstrated VA  Wenckebach at 380 milliseconds.  During rapid ventricular pacemaker, the  anterior activation was midline decremental.  D.  Programmed ventricular stimulation:  Programmed ventricular  stimulation was carried out from the RV apex at a base drive cycle  length of 096 milliseconds.  The S1-S2 interval was stepwise decreased  down to 420 milliseconds where retrograde AV node ERP was observed.  During programmed ventricular stimulation, the atrial activation was  midline decremental.  E.  Rapid atrial pacing:  Rapid atrial pacing was carried out from the  coronary sinus following ablation demonstrating AV Wenckebach cycle  length of 390 milliseconds.  During rapid atrial pacing, there was  inducible A-fib.  F.  Programmed atrial stimulation:  Programmed atrial stimulation was  carried out from the coronary sinus and the high right atrium at a base  drive cycle length of 045 milliseconds where the S1-S2 interval was  stepwise decreased down to 280 milliseconds where AV node ERP was  observed.  During probing stimulation, there were no AH jumps and no  echo beats.  There was inducible nonsustained A-fib.  G.  Arrhythmias observed.  1. Atrial flutter:  Initiation present at the time of EP study,     duration was  sustained, termination was with catheter ablation,      cycle length of the flutter was 212 milliseconds.  2. Atrial fibrillation:  Initiation rapid atrial pacing, duration was      sustained, termination was with IV Lopressor.      a.     RF energy application:  A total five RF energy applications       were delivered to the usual atrial flutter isthmus resulting in       termination of atrial flutter, restoration sinus rhythm, creation       of bidirectional block, and atrial flutter isthmus.      b.     Mapping:  Mapping demonstrated typical counter clockwise       tricuspid annular reentrant atrial  flutter.   CONCLUSION:  This study demonstrates successful electrophysiological  study and RF catheter ablation of typical atrial flutter with a total of  five RF energy applications delivered at the usual atrial flutter  isthmus resulting in termination of atrial flutter, restoration of sinus  rhythm, creation of bidirectional block, and atrial flutter isthmus.  It  should be noted that during rapid atrial pacing following ablation,  there was inducible atrial fibrillation which would transition into a  left atrial flutter.  This was terminated with IV Lopressor.  The  patient will be treated with beta blockers and if he develops A-fib,  flecainide would be an option to consider.      [name], MD  Electronically Signed     GWT/MEDQ  D:  05/08/2006  T:  05/08/2006  Job:  [id number]   cc:   Madolyn Frieze. Jens Som, MD, [facility]  Gwen Pounds, MD

## 2010-11-12 ENCOUNTER — Encounter (INDEPENDENT_AMBULATORY_CARE_PROVIDER_SITE_OTHER): Payer: Medicare Other

## 2010-11-12 ENCOUNTER — Ambulatory Visit (INDEPENDENT_AMBULATORY_CARE_PROVIDER_SITE_OTHER): Payer: Medicare Other | Admitting: Vascular Surgery

## 2010-11-12 ENCOUNTER — Other Ambulatory Visit: Payer: Self-pay | Admitting: Gastroenterology

## 2010-11-12 DIAGNOSIS — Z48812 Encounter for surgical aftercare following surgery on the circulatory system: Secondary | ICD-10-CM

## 2010-11-12 DIAGNOSIS — I83893 Varicose veins of bilateral lower extremities with other complications: Secondary | ICD-10-CM

## 2010-11-12 NOTE — Assessment & Plan Note (Signed)
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  He had previously undergone laser ablation of the right great and left great saphenous veins.  He has continued to have symptoms in both lower extremities, which have worsened.  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  He has been wearing long-leg elastic compression stockings (20 mm-30 mm gradient) as well as trying elevation and ibuprofen and continues to have symptoms.  There was some flow in the proximal right GSV near the junction when we last saw him in February.  Today I ordered a repeat study which I have reviewed and interpreted. He interestingly has recanalized both great saphenous systems, the right from the junction to the distal thigh with gross reflux throughout and on the left side, he has a medial and lateral branch and the medial main great saphenous vein is open down to the distal thigh supplying these bulging varicosities.  There is also some reflux in the lateral branch which becomes quite small after 4-5 cm.  No DVT noted.  PHYSICAL EXAMINATION:  The varicosities in the left thigh and calf continue to be quite tense.  In the right leg, he has bulging varicosities in the medial thigh and calf.  He has 1+ edema bilaterally with hyperpigmentation and chronic venous insufficiency changes.  He has 2+ dorsalis pedis pulses bilaterally.  The patient continues to be symptomatic despite conservative measures and has had recanalization of both great saphenous system.  I think we should proceed with laser ablation of the left great saphenous vein to be followed by laser ablation of the right great saphenous vein. Following that, we will have him  return in 3 months for further examination of the secondary varicosities.    [name], M.D. Electronically Signed  JDL/MEDQ  D:  11/12/2010  T:  11/12/2010  Job:  [id number]

## 2010-11-21 NOTE — Procedures (Unsigned)
DUPLEX DEEP VENOUS EXAM - LOWER EXTREMITY  INDICATION:  A 80-month followup.  HISTORY:  Edema:  No. Trauma/Surgery:  Right great saphenous vein laser ablation on 04/25/2010, left great saphenous vein laser ablation on 05/27/2010, right short saphenous vein laser ablation on 06/24/2010. Pain:  No. PE:  No. Previous DVT:  No. Anticoagulants: Other:  DUPLEX EXAM:               CFV   SFV   PopV  PTV    GSV               R  L  R  L  R  L  R   L  R  L Thrombosis    o  o  o  o  o  o  o   o  +  + Spontaneous   +  +  +  +  +  +  +   +  +  + Phasic        +  +  +  +  +  +  +   +  d  d Augmentation  +  +  +  +  +  +  +   +  +  + Compressible  +  +  +  +  +  +  +   +  p  p Competent     o  o  o  o  +  o  o   +  o  o  Legend:  + - yes  o - no  p - partial  D - decreased  IMPRESSION: 1. No evidence of deep venous thrombosis noted in the right lower     extremity. 2. 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 3. 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 4. Reflux of >500 milliseconds noted in the bilateral great saphenous     veins and throughout the bilateral femoral popliteal venous     systems.   _____________________________ [name], M.D.  CH/MEDQ  D:  11/13/2010  T:  11/13/2010  Job:  [id number]

## 2010-12-05 ENCOUNTER — Encounter: Payer: Self-pay | Admitting: Vascular Surgery

## 2010-12-30 ENCOUNTER — Ambulatory Visit (INDEPENDENT_AMBULATORY_CARE_PROVIDER_SITE_OTHER): Payer: Medicare Other | Admitting: Vascular Surgery

## 2010-12-30 VITALS — BP 119/74 | HR 62 | Resp 18 | Ht 68.0 in | Wt 200.0 lb

## 2010-12-30 DIAGNOSIS — I83893 Varicose veins of bilateral lower extremities with other complications: Secondary | ICD-10-CM

## 2010-12-30 NOTE — Progress Notes (Signed)
Laser ablation L GSV.

## 2010-12-30 NOTE — Progress Notes (Signed)
Subjective:     Patient ID: [name], male   DOB: 09-30-1940, 70 y.o.   MRN: [id number]  HPI this patient had laser ablation of left great saphenous vein performed today under local tumescent anesthesia. He tolerated the procedure well. 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 He also has an anterior accessory branch which is only 3 cm in length with gross reflux. The ablation today was done just proximal to that large branch about 2-3 cm from the saphenofemoral junction. He will return in one week for venous duplex exam to confirm closure of the left great saphenous vein. He will then be scheduled for a similar procedure on the right leg.   Review of Systems     Objective:   Physical Exam today his blood pressure is 119/74 heart rate 62 respirations 18.     Assessment:     Laser ablation of left great saphenous vein-tolerated well    Plan:    return in one week for venous duplex exam left leg to confirm closure left great saphenous

## 2011-01-07 ENCOUNTER — Ambulatory Visit (INDEPENDENT_AMBULATORY_CARE_PROVIDER_SITE_OTHER): Payer: Medicare Other

## 2011-01-07 ENCOUNTER — Ambulatory Visit (INDEPENDENT_AMBULATORY_CARE_PROVIDER_SITE_OTHER): Payer: Medicare Other | Admitting: Vascular Surgery

## 2011-01-07 VITALS — BP 105/63 | HR 64 | Resp 20

## 2011-01-07 DIAGNOSIS — Z48812 Encounter for surgical aftercare following surgery on the circulatory system: Secondary | ICD-10-CM

## 2011-01-07 DIAGNOSIS — I8310 Varicose veins of unspecified lower extremity with inflammation: Secondary | ICD-10-CM

## 2011-01-07 DIAGNOSIS — I83893 Varicose veins of bilateral lower extremities with other complications: Secondary | ICD-10-CM

## 2011-01-07 NOTE — Progress Notes (Signed)
Subjective:     Patient ID: [name], male   DOB: Oct 30, 1940, 70 y.o.   MRN: [id number]  HPI this 70 year old male patient returns having had laser ablation of his left great saphenous vein one week ago for painful varicosities and venous hypertension. He had previously had laser ablation of his left great saphenous vein performed in January of this year. The initial ultrasound one week post procedure revealed total closure of the left great saphenous vein but it did recanalize over time. He states that the procedure this time is having much less inflammation and pain in the left thigh area. He has taken ibuprofen as instructed. He has been wearing long elastic compression stockings as instructed. He denies any chest pain dyspnea on exertion , or other pulmonary symptoms  Review of Systems     Objective:   Physical Exam today blood pressure 105/63 heart rate 64 respirations 20 General he is alert and oriented x3 in no apparent distress Chest good auscultation no rhonchi or wheezing Abdomen soft nontender with no masses Left leg has mild discomfort along the course of the great saphenous vein in the groin and mid thigh area There are bulging varicosities in the left mid to distal thigh and into the medial calf area which seem less tense than prior to the procedure.he has 3+   pulses distally. Today I ordered a venous duplex exam of the left leg which are reviewed and interpreted. There is no DVT. 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    Assessment:     Doing well post laser ablation left great saphenous vein following recanalization of previous procedure.    Plan:     We'll proceed with laser ablation of right great saphenous vein in about 6 weeks.

## 2011-01-07 NOTE — Progress Notes (Signed)
One week fu laser ablation left GSV.

## 2011-01-09 ENCOUNTER — Encounter: Payer: Self-pay | Admitting: Vascular Surgery

## 2011-01-14 NOTE — Procedures (Unsigned)
DUPLEX DEEP VENOUS EXAM - LOWER EXTREMITY  INDICATION:  Follow up ablation.  HISTORY:  Edema:  No. Trauma/Surgery:  Left GSV ablation, 05/27/2010 and 12/30/2010. Pain:  Tenderness. PE:  No. Previous DVT:  No. Anticoagulants: Other:  DUPLEX EXAM:               CFV   SFV   PopV  PTV    GSV               R  L  R  L  R  L  R   L  R  L Thrombosis    o  o     o     o      o     + Spontaneous   +  +     +     +      +     o Phasic        +  +     +     +      +     o Augmentation  +  +     +     +      +     o Compressible  +  +     +     +      +     o Competent     +  o     o     o      +     +  Legend:  + - yes  o - no  p - partial  D - decreased  IMPRESSION: 1. No evidence of left lower extremity deep venous thrombosis. 2. 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   _____________________________ [name], M.D.  EM/MEDQ  D:  01/08/2011  T:  01/08/2011  Job:  [id number]

## 2011-01-16 ENCOUNTER — Other Ambulatory Visit: Payer: Self-pay | Admitting: Gastroenterology

## 2011-01-17 ENCOUNTER — Encounter: Payer: Self-pay | Admitting: Vascular Surgery

## 2011-02-14 ENCOUNTER — Encounter: Payer: Self-pay | Admitting: Vascular Surgery

## 2011-02-17 ENCOUNTER — Ambulatory Visit (INDEPENDENT_AMBULATORY_CARE_PROVIDER_SITE_OTHER): Payer: Medicare Other | Admitting: Vascular Surgery

## 2011-02-17 ENCOUNTER — Encounter: Payer: Self-pay | Admitting: Vascular Surgery

## 2011-02-17 VITALS — BP 139/75 | HR 67 | Resp 20 | Ht 68.0 in | Wt 195.0 lb

## 2011-02-17 DIAGNOSIS — I83893 Varicose veins of bilateral lower extremities with other complications: Secondary | ICD-10-CM

## 2011-02-17 NOTE — Progress Notes (Signed)
Subjective:     Patient ID: [name], male   DOB: 10-12-1940, 70 y.o.   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 He had demonstrated valvular incompetence from the mid thigh to the saphenofemoral junction. This had been previously treated 04/25/2010 with laser ablation. He developed recurrent symptomatology and had a repeat procedure performed today. We utilized slightly higher power today at 17 J rather than 15. This was done under local tumescent anesthesia. He tolerated the procedure well.  Review of Systems     Objective:   Physical Exam blood pressure 139/75 heart rate 66 respirations 20     Assessment:     Well tolerated laser ablation of right great saphenous vein from mid thigh to saphenofemoral junction (recurrent)    Plan:     Return in one week with venous duplex exam of right leg to confirm closure of proximal right great saphenous vein. Will then be scheduled for stab phlebectomy is of secondary varicosities in left leg

## 2011-02-17 NOTE — Progress Notes (Signed)
[phone] right GSV

## 2011-02-18 ENCOUNTER — Telehealth: Payer: Self-pay | Admitting: *Deleted

## 2011-02-18 NOTE — Telephone Encounter (Signed)
02/18/2011  Time: 12:21 PM   Patient Name: [name]  Patient of: Hart Rochester  Procedure:36478 Right GSV   Comments/Actions Taken:Reached patient at home. He is doing well and following all instructions. Instructed him to call if he has problems and reminded him of his fu appt.

## 2011-02-20 ENCOUNTER — Other Ambulatory Visit: Payer: Self-pay | Admitting: Lab

## 2011-02-20 DIAGNOSIS — I83893 Varicose veins of bilateral lower extremities with other complications: Secondary | ICD-10-CM

## 2011-02-21 ENCOUNTER — Encounter: Payer: Self-pay | Admitting: Gastroenterology

## 2011-02-22 ENCOUNTER — Other Ambulatory Visit: Payer: Self-pay | Admitting: Gastroenterology

## 2011-02-24 ENCOUNTER — Encounter: Payer: Self-pay | Admitting: Vascular Surgery

## 2011-02-25 ENCOUNTER — Encounter: Payer: Self-pay | Admitting: Vascular Surgery

## 2011-02-25 ENCOUNTER — Ambulatory Visit (INDEPENDENT_AMBULATORY_CARE_PROVIDER_SITE_OTHER): Payer: Medicare Other | Admitting: Vascular Surgery

## 2011-02-25 VITALS — BP 121/79 | HR 64 | Resp 18 | Ht 70.0 in

## 2011-02-25 DIAGNOSIS — M79609 Pain in unspecified limb: Secondary | ICD-10-CM

## 2011-02-25 DIAGNOSIS — Z48812 Encounter for surgical aftercare following surgery on the circulatory system: Secondary | ICD-10-CM

## 2011-02-25 DIAGNOSIS — I83893 Varicose veins of bilateral lower extremities with other complications: Secondary | ICD-10-CM

## 2011-02-25 DIAGNOSIS — M7989 Other specified soft tissue disorders: Secondary | ICD-10-CM

## 2011-02-25 NOTE — Progress Notes (Signed)
Subjective:     Patient ID: [name], male   DOB: 08-12-1940, 70 y.o.   MRN: [id number]  HPI this 70 year old patient underwent laser ablation of his right great saphenous vein one week ago for venous hypertension and valvular incompetence with reflux there he had previously had laser ablation December of 2011 and this eventually recanalized. He states that the leg has felt much better following this procedure with much less tightness and discomfort. He has worn his last compression stocking and elevated the leg as well as take ibuprofen.  Past Medical History  Diagnosis Date  . Hemorrhoids   . Diverticulosis   . Hyperlipidemia   . Atrial fibrillation   . Hypertension   . Atrial flutter   . GERD (gastroesophageal reflux disease)   . Adenomatous polyps 06/2004  . Pedal edema   . Hx of hernia repair   . Alcoholism   . Ulcer     History  Substance Use Topics  . Smoking status: Current Some Day Smoker -- 1.0 packs/day    Types: Cigarettes  . Smokeless tobacco: Never Used  . Alcohol Use: 0.0 oz/week     4-5 [place] daily    Family History  Problem Relation Age of Onset  . Colon polyps Brother   . Heart disease Father     rheumatic fever as child  . Stroke Mother     Allergies  Allergen Reactions  . Atorvastatin     REACTION: Marked leg fatigue  . Sulfonamide Derivatives     Current outpatient prescriptions:aspirin 81 MG tablet, Take 81 mg by mouth daily.  , Disp: , Rfl: ;  esomeprazole (NEXIUM) 40 MG capsule, Take 40 mg by mouth 2 (two) times daily.  , Disp: , Rfl: ;  Flaxseed, Linseed, (FLAX SEED OIL) 1000 MG CAPS, Take by mouth.  , Disp: , Rfl: ;  furosemide (LASIX) 40 MG tablet, Take 40 mg by mouth daily.  , Disp: , Rfl: ;  lisinopril (PRINIVIL,ZESTRIL) 10 MG tablet, Take 10 mg by mouth daily.  , Disp: , Rfl:  metoprolol (TOPROL XL) 50 MG 24 hr tablet, Take 50 mg by mouth daily.  , Disp: , Rfl: ;  milk thistle 175 MG tablet, Take 175 mg by mouth daily.  , Disp: , Rfl: ;   Multiple Vitamins-Minerals (CENTRUM SILVER PO), Take 1 tablet by mouth 2 (two) times daily.  , Disp: , Rfl: ;  Omega-3 Fatty Acids (FISH OIL) 1000 MG CAPS, Take 2 capsules by mouth daily.  , Disp: , Rfl: ;  omeprazole (PRILOSEC) 20 MG capsule, Take 20 mg by mouth daily.  , Disp: , Rfl:  spironolactone (ALDACTONE) 100 MG tablet, TAKE ONE TABLET BY MOUTH EVERY DAY, Disp: 30 tablet, Rfl: 2  BP 121/79  Pulse 64  Resp 18  Ht 5\' 10"  (1.778 m)  PF 250 L/min  There is no weight on file to calculate BMI.         Review of Systems he denies chest pain, dyspnea on exertion, PND, orthopnea, hemoptysis     Objective:   Physical Exam blood pressure 121/79 heart rate 64 respirations 18 Lower extremity exam reveals the right leg to have some mild tenderness along the course of the great saphenous vein in the thigh and groin area. He has 3+ femoral and dorsalis pedis pulse palpable. Distal edema is approximately 1+. He has changes consistent with chronic venous insufficiency below the knee.  Today I ordered venous duplex exam of the right  leg which I reviewed and interpreted. There is no DVT. There is closure of the great saphenous vein from the saphenofemoral junction to the knee. I also examined this independently with the sono site ultrasound     Assessment:     Successful ablation right great saphenous vein for venous hypertension    Plan:     Scheduled for ambulatory phlebectomy left leg in November

## 2011-02-25 NOTE — Progress Notes (Signed)
One week fu laser ablation

## 2011-02-25 NOTE — Progress Notes (Signed)
RLE venous performed 02/25/2011 post ablation 02/17/2011

## 2011-03-03 NOTE — Procedures (Unsigned)
DUPLEX DEEP VENOUS EXAM - LOWER EXTREMITY  INDICATION:  Right lower extremity pain, edema, varicose veins.  HISTORY:  Edema:  Yes. Trauma/Surgery:  Right GSV ablation on 02/17/2011. Pain:  Yes. PE:  No. Previous DVT:  No. Anticoagulants:  Baby aspirin daily. Other:  DUPLEX EXAM:               CFV   SFV   PopV  PTV    GSV               R  L  R  L  R  L  R   L  R  L Thrombosis    o     o     o     o      + Spontaneous   +     +     +     +      o Phasic        +     +     +     +      o Augmentation  +     +     +     +      o Compressible  +     +     +     +      p Competent     0     +     +     +  Legend:  + - yes  o - no  p - partial  D - decreased  IMPRESSION: 1. No evidence of deep venous thrombosis identified involving the     right lower extremity. 2. Good post ablation result of the right great saphenous vein from     the proximal to proximal/mid thigh segment and mid thigh to knee     segment. 3. One area in the proximal/mid thigh with presence of open varicosity     remains partially compressible. 4. No evidence of thrombus extending from the right great saphenous     vein into the common femoral vein present.   _____________________________ Quita Skye. Hart Rochester, M.D.  SH/MEDQ  D:  02/25/2011  T:  02/25/2011  Job:  [id number]

## 2011-03-28 ENCOUNTER — Encounter: Payer: Self-pay | Admitting: Vascular Surgery

## 2011-03-31 ENCOUNTER — Encounter: Payer: Self-pay | Admitting: Vascular Surgery

## 2011-03-31 ENCOUNTER — Ambulatory Visit (INDEPENDENT_AMBULATORY_CARE_PROVIDER_SITE_OTHER): Payer: Medicare Other | Admitting: Vascular Surgery

## 2011-03-31 VITALS — BP 146/86 | HR 73 | Resp 18 | Ht 68.0 in | Wt 221.3 lb

## 2011-03-31 DIAGNOSIS — I83893 Varicose veins of bilateral lower extremities with other complications: Secondary | ICD-10-CM

## 2011-03-31 NOTE — Progress Notes (Signed)
Laser Ablation Procedure      Date: 03/31/2011    [name] DOB:[birth year]  Consent signed: Yes  Surgeon:J.D. Hart Rochester    BP 146/86  Pulse 73  Resp 18  Ht 5\' 8"  (1.727 m)  Wt 221 lb 4.8 oz (100.381 kg)  BMI 33.65 kg/m2  Start time: 9:15am   End time: 10:25am  Tumescent Anesthesia: 250 cc 0.9% NaCl with 50 cc Lidocaine HCL with 1% Epi and 15 cc 8.4% NaHCO3  Local Anesthesia: 9 cc Lidocaine HCL and NaHCO3 (ratio 2:1)       Stab Phlebectomy: >20  Left leg Sites: Thigh and Calf  Patient tolerated procedure well: Yes [name], [name]    Description of Procedure:    The patient was then put into Trendelenburg position.  Local anesthetic was utilized overlying the marked varicosities.  Greater than >20 stab wounds were made using the tip of an 11 blade; and using the vein hook,  The phlebectomies were performed using a hemostat to avulse these varicosities.  Adequate hemostasis was achieved, and steri strips were applied to the stab wound.     ABD pads and thigh high compression stockings were applied.  Ace wrap bandages were applied over the phlebectomy sites.  Blood loss was less than 15 cc.  The patient ambulated out of the operating room having tolerated the procedure well.

## 2011-03-31 NOTE — Progress Notes (Signed)
Subjective:     Patient ID: [name], male   DOB: 1941-01-23, 70 y.o.   MRN: [id number]  HPI this 70 year old male had greater than 20 stab phlebectomy performed from the left leg for painful residual varicosities. He previously had undergone laser ablation of the left great saphenous vein. She did today was performed under local tumescent anesthesia. He tolerated the procedure well.  Review of Systems     Objective:   Physical Exam     Assessment:    well-tolerated stab phlebectomy (greater than 20) of left leg    Plan:     Return in 6-8 weeks for final followup

## 2011-04-01 ENCOUNTER — Telehealth: Payer: Self-pay | Admitting: *Deleted

## 2011-04-01 NOTE — Telephone Encounter (Signed)
04/01/2011  Time: 10:38 AM   Patient Name: [name]  Patient of: lawson  Procedure:37766 left leg  Patient doing well. Bled a little right after the procedure. Not having much pain except hates the stockings. Reminded him of his fu on 05/05/11 where we will see how he is and see if he needs any further tx.    Micki Riley

## 2011-04-25 ENCOUNTER — Encounter: Payer: Self-pay | Admitting: Vascular Surgery

## 2011-04-28 ENCOUNTER — Encounter: Payer: Self-pay | Admitting: Vascular Surgery

## 2011-04-29 ENCOUNTER — Encounter: Payer: Self-pay | Admitting: Gastroenterology

## 2011-05-02 ENCOUNTER — Encounter: Payer: Self-pay | Admitting: Vascular Surgery

## 2011-05-05 ENCOUNTER — Encounter: Payer: Self-pay | Admitting: Vascular Surgery

## 2011-05-05 ENCOUNTER — Ambulatory Visit (INDEPENDENT_AMBULATORY_CARE_PROVIDER_SITE_OTHER): Payer: Medicare Other | Admitting: Vascular Surgery

## 2011-05-05 VITALS — BP 136/80 | HR 94 | Resp 18 | Ht 68.0 in | Wt 210.0 lb

## 2011-05-05 DIAGNOSIS — I83893 Varicose veins of bilateral lower extremities with other complications: Secondary | ICD-10-CM

## 2011-05-05 NOTE — Progress Notes (Signed)
Subjective:     Patient ID: [name], male   DOB: 1940-10-06, 70 y.o.   MRN: [id number]  HPI 70 year old male patient returns for continued followup regarding his bilateral venous insufficiency. He has had laser ablation of both lower extremities (greater and small saphenous veins) and greater than 20 stab phlebectomy in the left leg. He denies any pain or swelling in either lower extremity and states legs feel much better. Returns today for decision regarding possible stab phlebectomy in the right lower extremity. He is no longer wearing elastic compression stockings   Review of Systems     Objective:   Physical ExamBP 136/80  Pulse 94  Resp 18  Ht 5\' 8"  (1.727 m)  Wt 210 lb (95.255 kg)  BMI 31.93 kg/m2  Chest no rhonchi or wheezing Lower extremity exam reveals 3+ femoral and dorsalis pedis pulses palpable bilaterally. There are no ulcerations noted bilaterally. He has a few small bulges in the left leg remaining the medial proximal calf but much better than before the procedures. He has mild chronic hyperpigmented areas bilaterally. Right leg has a few spider and reticular veins but no significant bulging varicosities        Assessment:    good result following bilateral laser ablation procedures for severe venous insufficiency and painful    Plan:     Stab phlebectomy right lower extremity not necessary. Will return to see [place] on when necessary basis

## 2011-05-12 ENCOUNTER — Ambulatory Visit: Payer: Medicare Other | Admitting: Vascular Surgery

## 2011-05-24 ENCOUNTER — Other Ambulatory Visit: Payer: Self-pay | Admitting: Gastroenterology

## 2011-05-26 NOTE — Telephone Encounter (Signed)
NEEDS OFFICE VISIT FOR ANY FURTHER REFILLS! 

## 2011-06-04 ENCOUNTER — Ambulatory Visit (AMBULATORY_SURGERY_CENTER): Payer: MEDICARE | Admitting: *Deleted

## 2011-06-04 ENCOUNTER — Encounter: Payer: Self-pay | Admitting: Gastroenterology

## 2011-06-04 DIAGNOSIS — Z860100 Personal history of colon polyps, unspecified: Secondary | ICD-10-CM

## 2011-06-04 DIAGNOSIS — Z1211 Encounter for screening for malignant neoplasm of colon: Secondary | ICD-10-CM

## 2011-06-04 MED ORDER — PEG-KCL-NACL-NASULF-NA ASC-C 100 G PO SOLR: 1.0000 | Freq: Once | ORAL | Status: DC

## 2011-06-13 ENCOUNTER — Encounter: Payer: Self-pay | Admitting: Gastroenterology

## 2011-06-13 ENCOUNTER — Ambulatory Visit (AMBULATORY_SURGERY_CENTER): Payer: MEDICARE | Admitting: Gastroenterology

## 2011-06-13 DIAGNOSIS — Z1211 Encounter for screening for malignant neoplasm of colon: Secondary | ICD-10-CM

## 2011-06-13 DIAGNOSIS — Z86010 Personal history of colonic polyps: Secondary | ICD-10-CM

## 2011-06-13 DIAGNOSIS — D126 Benign neoplasm of colon, unspecified: Secondary | ICD-10-CM

## 2011-06-13 MED ORDER — SODIUM CHLORIDE 0.9 % IV SOLN: 500.0000 mL | INTRAVENOUS | Status: DC

## 2011-06-13 NOTE — Op Note (Signed)
[facility] [address] [place], [place]  [address]  COLONOSCOPY PROCEDURE REPORT PATIENT:  [name], [name]  MR#:  [id number] BIRTHDATE:  07-20-1940, 70 yrs. old  GENDER:  male ENDOSCOPIST:  Judie Petit T. Russella Dar, MD, [facility]  PROCEDURE DATE:  06/13/2011 PROCEDURE:  Colonoscopy with snare polypectomy ASA CLASS:  Class II INDICATIONS:  1) surveillance and high-risk screening  2) history of pre-cancerous (adenomatous) colon polyps: 2006. MEDICATIONS:   These medications were titrated to patient response per physician's verbal order, Fentanyl 100 mcg IV, Versed 10 mg IV, Benadryl 25 mg IV DESCRIPTION OF PROCEDURE:   After the risks benefits and alternatives of the procedure were thoroughly explained, informed consent was obtained.  Digital rectal exam was performed and revealed no abnormalities.   The [id number] endoscope was introduced through the anus and advanced to the cecum, which was identified by both the appendix and ileocecal valve, without limitations.  The quality of the prep was good, using MoviPrep. The instrument was then slowly withdrawn as the colon was fully examined. <<PROCEDUREIMAGES>> FINDINGS:  A sessile polyp was found in the ascending colon. It was 5 mm in size. Polyp was snared without cautery. Retrieval was successful. A sessile polyp was found in the descending colon. It was 6 mm in size. Polyp was snared without cautery. Retrieval was successful. Moderate diverticulosis was found in the sigmoid to descending colon.  Otherwise normal colonoscopy without other polyps, masses, vascular ectasias, or inflammatory changes. Retroflexed views in the rectum revealed internal hemorrhoids, small.  The time to cecum =  2.67  minutes. The scope was then withdrawn (time =  9.5  min) from the patient and the procedure completed. COMPLICATIONS:  None  ENDOSCOPIC IMPRESSION: 1) 5 mm sessile polyp in the ascending colon 2) 6 mm sessile polyp in the descending  colon 3) Moderate diverticulosis in the sigmoid to descending colon 4) Internal hemorrhoids  RECOMMENDATIONS: 1) Await pathology results 2) High fiber diet with liberal fluid intake. 3) Repeat Colonoscopy in 5 years. 4) Consider MAC sedation for future procedures  [name], MD, [facility]  CC:  [name], MD  n. Rosalie DoctorVenita Lick. Tegh Franek at 06/13/2011 11:08 AM  [name], [phone]

## 2011-06-13 NOTE — Progress Notes (Signed)
Patient did not experience any of the following events: a burn prior to discharge; a fall within the facility; wrong site/side/patient/procedure/implant event; or a hospital transfer or hospital admission upon discharge from the facility. (G8907) Patient did not have preoperative order for IV antibiotic SSI prophylaxis. (G8918)  

## 2011-06-13 NOTE — Patient Instructions (Signed)
FOLLOW DISCHARGE INSTRUCTIONS (BLUE AND GREEN SHEETS).. 

## 2011-06-16 ENCOUNTER — Telehealth: Payer: Self-pay | Admitting: *Deleted

## 2011-06-16 NOTE — Telephone Encounter (Signed)
  Follow up Call-  Call back number 06/13/2011  Post procedure Call Back phone  # [phone] okay to leave a message     Patient questions:  Do you have a fever, pain , or abdominal swelling? no Pain Score  0 *  Have you tolerated food without any problems? yes  Have you been able to return to your normal activities? yes  Do you have any questions about your discharge instructions: Diet   no Medications  no Follow up visit  no  Do you have questions or concerns about your Care? no  Actions: * If pain score is 4 or above: No action needed, pain <4.

## 2011-06-18 ENCOUNTER — Encounter: Payer: Self-pay | Admitting: Gastroenterology

## 2011-06-25 ENCOUNTER — Other Ambulatory Visit: Payer: Self-pay | Admitting: Gastroenterology

## 2012-10-07 ENCOUNTER — Encounter (HOSPITAL_COMMUNITY): Payer: Self-pay | Admitting: *Deleted

## 2012-10-07 ENCOUNTER — Emergency Department (HOSPITAL_COMMUNITY): Payer: Medicare Other

## 2012-10-07 ENCOUNTER — Inpatient Hospital Stay (HOSPITAL_COMMUNITY)
Admission: EM | Admit: 2012-10-07 | Discharge: 2012-10-09 | DRG: 683 | Disposition: A | Discharge status: Home / Self Care | Payer: Medicare Other | Attending: Internal Medicine | Admitting: Internal Medicine

## 2012-10-07 DIAGNOSIS — R609 Edema, unspecified: Secondary | ICD-10-CM

## 2012-10-07 DIAGNOSIS — E871 Hypo-osmolality and hyponatremia: Secondary | ICD-10-CM | POA: Diagnosis present

## 2012-10-07 DIAGNOSIS — R071 Chest pain on breathing: Secondary | ICD-10-CM

## 2012-10-07 DIAGNOSIS — E875 Hyperkalemia: Secondary | ICD-10-CM | POA: Diagnosis present

## 2012-10-07 DIAGNOSIS — E785 Hyperlipidemia, unspecified: Secondary | ICD-10-CM | POA: Diagnosis present

## 2012-10-07 DIAGNOSIS — D61818 Other pancytopenia: Secondary | ICD-10-CM | POA: Diagnosis not present

## 2012-10-07 DIAGNOSIS — Z86010 Personal history of colonic polyps: Secondary | ICD-10-CM

## 2012-10-07 DIAGNOSIS — R079 Chest pain, unspecified: Secondary | ICD-10-CM

## 2012-10-07 DIAGNOSIS — I83893 Varicose veins of bilateral lower extremities with other complications: Secondary | ICD-10-CM

## 2012-10-07 DIAGNOSIS — I440 Atrioventricular block, first degree: Secondary | ICD-10-CM | POA: Diagnosis present

## 2012-10-07 DIAGNOSIS — R188 Other ascites: Secondary | ICD-10-CM | POA: Diagnosis present

## 2012-10-07 DIAGNOSIS — R1013 Epigastric pain: Secondary | ICD-10-CM

## 2012-10-07 DIAGNOSIS — K5730 Diverticulosis of large intestine without perforation or abscess without bleeding: Secondary | ICD-10-CM

## 2012-10-07 DIAGNOSIS — E869 Volume depletion, unspecified: Secondary | ICD-10-CM | POA: Diagnosis present

## 2012-10-07 DIAGNOSIS — K219 Gastro-esophageal reflux disease without esophagitis: Secondary | ICD-10-CM | POA: Diagnosis present

## 2012-10-07 DIAGNOSIS — F1020 Alcohol dependence, uncomplicated: Secondary | ICD-10-CM | POA: Diagnosis present

## 2012-10-07 DIAGNOSIS — F411 Generalized anxiety disorder: Secondary | ICD-10-CM | POA: Diagnosis present

## 2012-10-07 DIAGNOSIS — I10 Essential (primary) hypertension: Secondary | ICD-10-CM | POA: Diagnosis present

## 2012-10-07 DIAGNOSIS — N179 Acute kidney failure, unspecified: Principal | ICD-10-CM | POA: Diagnosis present

## 2012-10-07 DIAGNOSIS — K7030 Alcoholic cirrhosis of liver without ascites: Secondary | ICD-10-CM | POA: Diagnosis present

## 2012-10-07 DIAGNOSIS — I951 Orthostatic hypotension: Secondary | ICD-10-CM | POA: Diagnosis present

## 2012-10-07 DIAGNOSIS — I498 Other specified cardiac arrhythmias: Secondary | ICD-10-CM | POA: Diagnosis present

## 2012-10-07 DIAGNOSIS — K429 Umbilical hernia without obstruction or gangrene: Secondary | ICD-10-CM

## 2012-10-07 DIAGNOSIS — R002 Palpitations: Secondary | ICD-10-CM

## 2012-10-07 DIAGNOSIS — I4891 Unspecified atrial fibrillation: Secondary | ICD-10-CM | POA: Diagnosis present

## 2012-10-07 DIAGNOSIS — Z79899 Other long term (current) drug therapy: Secondary | ICD-10-CM

## 2012-10-07 DIAGNOSIS — I4892 Unspecified atrial flutter: Secondary | ICD-10-CM | POA: Diagnosis present

## 2012-10-07 DIAGNOSIS — K649 Unspecified hemorrhoids: Secondary | ICD-10-CM

## 2012-10-07 DIAGNOSIS — I999 Unspecified disorder of circulatory system: Secondary | ICD-10-CM

## 2012-10-07 LAB — COMPREHENSIVE METABOLIC PANEL
Alkaline Phosphatase: 102 U/L (ref 39–117)
BUN: 64 mg/dL — ABNORMAL HIGH (ref 6–23)
GFR calc Af Amer: 33 mL/min — ABNORMAL LOW (ref >90)
GFR calc non Af Amer: 29 mL/min — ABNORMAL LOW (ref >90)
Glucose, Bld: 97 mg/dL (ref 70–99)
Potassium: 7.1 mEq/L (ref 3.5–5.1)
Total Bilirubin: 0.9 mg/dL (ref 0.3–1.2)
Total Protein: 8.1 g/dL (ref 6.0–8.3)

## 2012-10-07 LAB — CBC WITH DIFFERENTIAL/PLATELET
Basophils Absolute: 0 10*3/uL (ref 0.0–0.1)
Lymphocytes Relative: 10 % — ABNORMAL LOW (ref 12–46)
Neutro Abs: 4.2 10*3/uL (ref 1.7–7.7)
Neutrophils Relative %: 74 % (ref 43–77)
Platelets: 130 10*3/uL — ABNORMAL LOW (ref 150–400)
RDW: 13.1 % (ref 11.5–15.5)
WBC: 5.7 10*3/uL (ref 4.0–10.5)

## 2012-10-07 LAB — CK: Total CK: 38 U/L (ref 7–232)

## 2012-10-07 LAB — POCT I-STAT TROPONIN I

## 2012-10-07 LAB — POCT I-STAT, CHEM 8
Chloride: 103 mEq/L (ref 96–112)
HCT: 38 % — ABNORMAL LOW (ref 39.0–52.0)
Hemoglobin: 12.9 g/dL — ABNORMAL LOW (ref 13.0–17.0)
Potassium: 7.2 mEq/L (ref 3.5–5.1)

## 2012-10-07 LAB — BASIC METABOLIC PANEL
BUN: 60 mg/dL — ABNORMAL HIGH (ref 6–23)
CO2: 23 mEq/L (ref 19–32)
Calcium: 9.9 mg/dL (ref 8.4–10.5)
Chloride: 93 mEq/L — ABNORMAL LOW (ref 96–112)
Creatinine, Ser: 1.9 mg/dL — ABNORMAL HIGH (ref 0.50–1.35)
GFR calc Af Amer: 40 mL/min — ABNORMAL LOW (ref >90)

## 2012-10-07 LAB — AMMONIA: Ammonia: 23 umol/L (ref 11–60)

## 2012-10-07 MED ORDER — THIAMINE HCL 100 MG/ML IJ SOLN
Freq: Once | INTRAVENOUS | Status: AC
  Administered 2012-10-07 – 2012-10-08 (×2): via INTRAVENOUS
  Filled 2012-10-07 (×3): qty 1000

## 2012-10-07 MED ORDER — ADULT MULTIVITAMIN W/MINERALS CH
1.0000 | ORAL_TABLET | Freq: Every day | ORAL | Status: DC
  Administered 2012-10-07 – 2012-10-08 (×2): 1 via ORAL
  Filled 2012-10-07 (×3): qty 1

## 2012-10-07 MED ORDER — SODIUM CHLORIDE 0.9 % IV SOLN
1.0000 g | Freq: Once | INTRAVENOUS | Status: AC
  Administered 2012-10-07: 1 g via INTRAVENOUS
  Filled 2012-10-07: qty 10

## 2012-10-07 MED ORDER — SODIUM CHLORIDE 0.9 % IV SOLN
INTRAVENOUS | Status: DC
  Administered 2012-10-07 – 2012-10-08 (×2): via INTRAVENOUS

## 2012-10-07 MED ORDER — SODIUM POLYSTYRENE SULFONATE 15 GM/60ML PO SUSP
30.0000 g | Freq: Once | ORAL | Status: AC
  Administered 2012-10-07: 30 g via ORAL
  Filled 2012-10-07: qty 120

## 2012-10-07 MED ORDER — SODIUM CHLORIDE 0.9 % IV BOLUS (SEPSIS)
1000.0000 mL | Freq: Once | INTRAVENOUS | Status: AC
Start: 2012-10-07 — End: 2012-10-07
  Administered 2012-10-07: 1000 mL via INTRAVENOUS

## 2012-10-07 MED ORDER — LORAZEPAM 2 MG/ML IJ SOLN: 0.5000 mg | INTRAMUSCULAR | Status: DC | PRN

## 2012-10-07 MED ORDER — SODIUM CHLORIDE 0.9 % IJ SOLN
3.0000 mL | Freq: Two times a day (BID) | INTRAMUSCULAR | Status: DC
  Administered 2012-10-08: 3 mL via INTRAVENOUS

## 2012-10-07 MED ORDER — PANTOPRAZOLE SODIUM 40 MG PO TBEC
40.0000 mg | DELAYED_RELEASE_TABLET | Freq: Every day | ORAL | Status: DC
  Administered 2012-10-08 – 2012-10-09 (×2): 40 mg via ORAL
  Filled 2012-10-07 (×2): qty 1

## 2012-10-07 MED ORDER — ENOXAPARIN SODIUM 30 MG/0.3ML ~~LOC~~ SOLN
30.0000 mg | SUBCUTANEOUS | Status: DC
  Administered 2012-10-08: 30 mg via SUBCUTANEOUS
  Filled 2012-10-07: qty 0.3

## 2012-10-07 MED ORDER — ASPIRIN EC 81 MG PO TBEC
81.0000 mg | DELAYED_RELEASE_TABLET | Freq: Every day | ORAL | Status: DC
  Administered 2012-10-08 – 2012-10-09 (×2): 81 mg via ORAL
  Filled 2012-10-07 (×2): qty 1

## 2012-10-07 MED ORDER — MORPHINE SULFATE 2 MG/ML IJ SOLN: 2.0000 mg | INTRAMUSCULAR | Status: DC | PRN

## 2012-10-07 MED ORDER — METOPROLOL SUCCINATE ER 25 MG PO TB24
25.0000 mg | ORAL_TABLET | Freq: Every day | ORAL | Status: DC
  Administered 2012-10-07 – 2012-10-08 (×2): 25 mg via ORAL
  Filled 2012-10-07 (×3): qty 1

## 2012-10-07 MED ORDER — SODIUM CHLORIDE 0.9 % IV SOLN: 1.0000 g | Freq: Once | INTRAVENOUS | Status: DC

## 2012-10-07 MED ORDER — SODIUM BICARBONATE 8.4 % IV SOLN
50.0000 meq | Freq: Once | INTRAVENOUS | Status: AC
  Administered 2012-10-07: 50 meq via INTRAVENOUS
  Filled 2012-10-07: qty 50

## 2012-10-07 NOTE — ED Notes (Signed)
Patient's Chem 8 is abnormal. Shown to Dr Hyacinth Meeker.

## 2012-10-07 NOTE — ED Notes (Signed)
Attempted to call report, stated charge nurse unable to take

## 2012-10-07 NOTE — H&P (Signed)
PCP:   Gwen Pounds, MD   Chief Complaint:  Dizziness  HPI: [name] is a 72 year old white male with a history of alcoholic cirrhosis with ascites who presented to the emergency department with the complaint of dizziness and elevated potassium. The patient states that he has had chronic dizziness which is worsened over the past several weeks. This morning, while ambulating in the parking lot at [employer] he became very dizzy and had to hold onto the cart to prevent falling. He called Dr. Ferd Hibbs office and was instructed to come in for lab work. Lab work revealed a sodium of 126, potassium 6.9, BUN 72, and creatinine of 2.4. Of note, his potassium was 5.6 on 3/31 with a BUN and creatinine of 31 and 1.3 respectively at that time. He has been maintained on stable doses of Aldactone 100 mg daily and Lasix 40 mg twice a day for his ascites. His last paracentesis was about a year ago. He states that his fluid status has been stable. He is also on lisinopril for his blood pressure which he states has been running a little low. His oral intake has been unchanged. Labs in the emergency department confirmed hyperkalemia with a potassium of 7.2. He has received calcium gluconate, sodium bicarbonate Kayexalate 30 g, and is receiving a liter of normal saline bolus at this time. He states that other than the dizziness he feels well   Review of Systems:  Review of Systems - All systems reviewed with patient and are negative except in history of present illness with following exceptions: Chronic venous stasis which he relates to [place].  Past Medical History: Past Medical History  Diagnosis Date  . Hemorrhoids   . Diverticulosis   . Hyperlipidemia   . Atrial fibrillation   . Hypertension   . Atrial flutter   . GERD (gastroesophageal reflux disease)   . Adenomatous polyps 06/2004  . Pedal edema   . Hx of hernia repair   . Alcoholism   . Ulcer   . Anxiety     PTSD- no meds  . 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 Umbilical hernia repair    . Tonsillectomy    . Atrial ablation surgery    . Varicose vein surgery      x4  . Colonoscopy    . Polypectomy     Umbilical hernia repair- 2005 Right hand surgery Polypectomy Tonsillectomy Adenoidectomy  Medications: Prior to Admission medications   Medication Sig Start Date End Date Taking? Authorizing Provider  aspirin EC 81 MG tablet Take 81 mg by mouth daily.   Yes Historical Provider, MD  Flaxseed, Linseed, (FLAX SEED OIL) 1000 MG CAPS Take 2 capsules by mouth at bedtime. Takes a combination Flaxseed-Omega Oil now   Yes Historical Provider, MD  furosemide (LASIX) 40 MG tablet Take 40 mg by mouth 2 (two) times daily.    Yes Historical Provider, MD  lisinopril (PRINIVIL,ZESTRIL) 10 MG tablet Take 10 mg by mouth daily.     Yes Historical Provider,  MD  metoprolol (TOPROL XL) 50 MG 24 hr tablet Take 25 mg by mouth at bedtime.    Yes Historical Provider, MD  milk thistle 175 MG tablet Take 175 mg by mouth at bedtime.    Yes Historical Provider, MD  Multiple Vitamin (MULTIVITAMIN WITH MINERALS) TABS Take 1 tablet by mouth at bedtime.   Yes Historical Provider, MD  omeprazole (PRILOSEC) 20 MG capsule Take 20 mg by mouth daily.    Yes Historical Provider, MD  spironolactone (ALDACTONE) 100 MG tablet Take 100 mg by mouth at bedtime.   Yes Historical Provider, MD     Allergies:   Allergies  Allergen Reactions  . Atorvastatin     REACTION: Marked leg fatigue  . Sulfonamide Derivatives     Unsure of reaction    Social History: Separated with five children and 16 grandchildren Denies ever using tobacco and illicit drugs Drinks 4/5 Ruehl daily-- Alcoholic Partially recovered. [place] veteran  Family History: Family History  Problem Relation Age of Onset  . Colon polyps Brother   . Prostate cancer Brother   . Heart disease Father     rheumatic fever as child  . Stroke Mother   . Colon cancer Neg Hx   . Esophageal cancer Neg Hx   . Rectal cancer Neg Hx   . Stomach cancer Neg Hx    Polyps- brother Heart disease- father Son died 26 of MI 04-15-2011  Physical Exam: Filed Vitals:   10/07/12 1610  BP: 139/67  Pulse: 73  Temp: 99 F (37.2 C)  TempSrc: Oral  Resp: 16  Height: 5\' 8"  (1.727 m)  Weight: 95.255 kg (210 lb)  SpO2: 99%   General appearance: alert and no distress Head: Normocephalic, without obvious abnormality, atraumatic Eyes: conjunctivae/corneas clear. PERRL, EOM's intact. No scleral icterus Nose: Nares normal. Septum midline. Mucosa normal. No drainage or sinus tenderness. Throat: lips, mucosa, and tongue normal; teeth and gums normal Neck: no adenopathy, no carotid bruit, no JVD and thyroid not enlarged, symmetric, no tenderness/mass/nodules Resp: clear to auscultation bilaterally Cardio: regular rate and rhythm GI: Distended with moderate amount of ascites with mild bulging flanks and shifting dome  Extremities: Trace edema with chronic venous stasis changes Pulses: 2+ and symmetric Lymph nodes: no cervical lymphadenopathy Neurologic: Alert and oriented X 3, normal strength and tone. Normal symmetric reflexes. No asterixis    Labs on Admission:   Recent Labs  10/07/12 1646 10/07/12 1655  NA 126* 129*  K 7.1* 7.2*  CL 96 103  CO2 18*  --   GLUCOSE 97 98  BUN 64* 77*  CREATININE 2.20* 2.30*  CALCIUM  9.8  --     Recent Labs  10/07/12 1646  AST 35  ALT 45  ALKPHOS 102  BILITOT 0.9  PROT 8.1  ALBUMIN 4.1     Recent Labs  10/07/12 1646 10/07/12 1655  WBC 5.7  --   NEUTROABS 4.2  --   HGB 12.7* 12.9*  HCT 35.0* 38.0*  MCV 93.8  --   PLT 130*  --     Recent Labs  10/07/12 1646  CKTOTAL 38   Lab Results  Component Value Date   INR 1.3* 08/21/2010   No results found for this basename: TSH, T4TOTAL, FREET3, T3FREE, THYROIDAB,  in the last 72 hours No results found for this basename: VITAMINB12, FOLATE, FERRITIN, TIBC, IRON, RETICCTPCT,  in the last 72 hours  Radiological Exams on Admission: Dg Chest 2 View  10/07/2012   *RADIOLOGY  REPORT*  Clinical Data: Shortness of breath, dizziness, history hypertension, atrial fibrillation, GERD  CHEST - 2 VIEW  Comparison: 05/07/2006  Findings: Borderline enlargement of cardiac silhouette. Stable mediastinal contours and pulmonary vascularity. Lungs appear emphysematous but clear. No pleural effusion or pneumothorax. Mild scattered endplate spur formation thoracic spine and osseous demineralization noted.  IMPRESSION: Borderline enlargement of cardiac silhouette. Question emphysematous changes. No acute abnormalities.   Original Report Authenticated By: Ulyses Southward, M.D.   Orders placed during the hospital encounter of 10/07/12  . EKG 12-LEAD- Sinus rhythm with first degree AV block. Peak T waves     Assessment/Plan Principal Problem: 1. Acute renal failure with Hyperkalemia- severe hyperkalemia with EKG changes and acute renal failure in the setting of  potassium sparing diuretics and ACE inhibitor.  His prior potassium was only mildly elevated with no recent changes in his medication regimen. Acute changes are likely secondary to intravascular volume depletion. Will admit to telemetry for treatment with IV fluid hydration. He has already received Kayexalate and acute treatment with calcium gluconate and sodium bicarbonate in the ER.  We'll repeat potassium to ensure that it is improving. Hold lisinopril and Aldactone for now.  Active Problems: 2. Cirrhosis, alcoholic with Ascites-  He is at high risk for worsening ascites with discontinuation of his diuretics. He may eventually be able to tolerate Aldactone without an ACE inhibitor. We'll resume Lasix once his acute renal failure is improving. 3. Orthostatic Dizziness- likely secondary to volume depletion in the setting of diuretic therapy. Check orthostatic blood pressures. 4. ALCOHOLISM- he reports that he drinks 3-4 drinks every night. No history of delirium tremens but will need to monitor closely with serial protocol. Ativan as needed for anxiety. 5. HYPERTENSION- consider alternative to lisinopril if his blood pressure increases significantly with discontinuation. 6. Disposition- anticipate discharge to home in 2-3 days once his electrolytes and renal function is stable.     Quinlyn Tep,W DOUGLAS 10/07/2012, 5:43 PM

## 2012-10-07 NOTE — ED Notes (Signed)
Pt states he has been feeling dizzy for last few months prompting him to see his PMD.  Has been having lab work done and today's labs were abnormal.  The office called him and told him to come here for possible admission.  Pt is unsure what was abnormal.  Pt's states his dizziness comes and goes as well as nausea.  States he has been waking up in a sweat lately as well. Currently he has no dizziness or nausea

## 2012-10-07 NOTE — ED Provider Notes (Signed)
History     CSN: [id number]  Arrival date & time 10/07/12  1555   First MD Initiated Contact with Patient 10/07/12 1635      Chief Complaint  Patient presents with  . Abnormal Lab  . 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 He has required large volume paracentesis in the past. He's been having increased episodes of the dizziness that he describes as becoming extremely lightheaded and feeling off balance when he walks. These have been getting more and more frequent over the last week prompting him to hold onto things as he stands up until he gets his balance. He denies vertigo, denies syncope, denies chest pain, back pain, abdominal pain. He does note having increased shortness of breath over the last 24 hours but no fevers chills or significant increase in the swelling of his legs. He is on multiple medications including metoprolol, lisinopril, Lasix, spironolactone.  He was seen by his family doctor today who drew blood work, at his office call the patient and told him to come immediately to the hospital for evaluation.  Dr. Timothy Lasso saw pt and had Na of 126 and K of 6.9.  Cr was 2.4.    The history is provided by the patient and medical records.    Past Medical History  Diagnosis Date  . Hemorrhoids   . Diverticulosis   . Hyperlipidemia   . Atrial fibrillation   . Hypertension   . Atrial flutter   . GERD (gastroesophageal reflux disease)   . Adenomatous polyps 06/2004  . Pedal edema   . Hx of hernia repair   . Alcoholism   . Ulcer   . Anxiety     PTSD- no meds  . Hiatal hernia     Past Surgical History  Procedure Laterality Date  . Umbilical hernia repair    . Tonsillectomy    . Atrial ablation surgery    . Varicose vein surgery      x4  . Colonoscopy    .  Polypectomy      Family History  Problem Relation Age of Onset  . Colon polyps Brother   . Prostate cancer Brother   . Heart disease Father     rheumatic fever as child  . Stroke Mother   . Colon cancer Neg Hx   . Esophageal cancer Neg Hx   . Rectal cancer Neg Hx   . Stomach cancer Neg Hx     History  Substance Use Topics  . Smoking status: Former Smoker    Types: Cigarettes    Quit date: 01/16/2011  . Smokeless tobacco: Never Used  . Alcohol Use: 0.0 oz/week    12-15 Cans of beer per week     Comment: 4-5 Pruiett daily      Review of Systems  All other systems reviewed and are negative.    Allergies  Atorvastatin and Sulfonamide derivatives  Home Medications   Current Outpatient Rx  Name  Route  Sig  Dispense  Refill  . aspirin EC 81 MG tablet   Oral   Take 81 mg by mouth daily.         . Flaxseed, Linseed, (FLAX SEED OIL) 1000 MG CAPS   Oral   Take 2 capsules by mouth at bedtime. Takes a combination Flaxseed-Omega Oil now         . furosemide (  LASIX) 40 MG tablet   Oral   Take 40 mg by mouth 2 (two) times daily.          Marland Kitchen lisinopril (PRINIVIL,ZESTRIL) 10 MG tablet   Oral   Take 10 mg by mouth daily.           . metoprolol (TOPROL XL) 50 MG 24 hr tablet   Oral   Take 25 mg by mouth at bedtime.          . milk thistle 175 MG tablet   Oral   Take 175 mg by mouth at bedtime.          . Multiple Vitamin (MULTIVITAMIN WITH MINERALS) TABS   Oral   Take 1 tablet by mouth at bedtime.         Marland Kitchen omeprazole (PRILOSEC) 20 MG capsule   Oral   Take 20 mg by mouth daily.          Marland Kitchen spironolactone (ALDACTONE) 100 MG tablet   Oral   Take 100 mg by mouth at bedtime.           BP 139/67  Pulse 73  Temp(Src) 99 F (37.2 C) (Oral)  Resp 16  Ht 5\' 8"  (1.727 m)  Wt 210 lb (95.255 kg)  BMI 31.94 kg/m2  SpO2 99%  Physical Exam  Nursing note and vitals reviewed. Constitutional: He appears well-developed and well-nourished. No  distress.  HENT:  Head: Normocephalic and atraumatic.  Mouth/Throat: Oropharynx is clear and moist. No oropharyngeal exudate.  Eyes: Conjunctivae and EOM are normal. Pupils are equal, round, and reactive to light. Right eye exhibits no discharge. Left eye exhibits no discharge. No scleral icterus.  Neck: Normal range of motion. Neck supple. No JVD present. No thyromegaly present.  Cardiovascular: Normal rate, regular rhythm, normal heart sounds and intact distal pulses.  Exam reveals no gallop and no friction rub.   No murmur heard. Pulmonary/Chest: Effort normal and breath sounds normal. No respiratory distress. He has no wheezes. He has no rales.  Abdominal: Soft. Bowel sounds are normal. He exhibits no distension and no mass. There is no tenderness.  Musculoskeletal: Normal range of motion. He exhibits edema ( scant bilateral LE edema). He exhibits no tenderness.  Lymphadenopathy:    He has no cervical adenopathy.  Neurological: He is alert. Coordination normal.  No drift, CN 3-12 intact, speech clear, no ataxia, normal FNF, normal strenght and sens of the bil UE and LE's.  Skin: Skin is warm and dry. No rash noted. No erythema.  Psychiatric: He has a normal mood and affect. His behavior is normal.    ED Course  Procedures (including critical care time)  Labs Reviewed  COMPREHENSIVE METABOLIC PANEL - Abnormal; Notable for the following:    Sodium 126 (*)    Potassium 7.1 (*)    CO2 18 (*)    BUN 64 (*)    Creatinine, Ser 2.20 (*)    GFR calc non Af Amer 29 (*)    GFR calc Af Amer 33 (*)    All other components within normal limits  CBC WITH DIFFERENTIAL - Abnormal; Notable for the following:    RBC 3.73 (*)    Hemoglobin 12.7 (*)    HCT 35.0 (*)    MCHC 36.3 (*)    Platelets 130 (*)    Lymphocytes Relative 10 (*)    Lymphs Abs 0.6 (*)    Monocytes Relative 14 (*)    All other components within normal  limits  POCT I-STAT, CHEM 8 - Abnormal; Notable for the following:     Sodium 129 (*)    Potassium 7.2 (*)    BUN 77 (*)    Creatinine, Ser 2.30 (*)    Hemoglobin 12.9 (*)    HCT 38.0 (*)    All other components within normal limits  CK  POCT I-STAT TROPONIN I   Dg Chest 2 View  10/07/2012   *RADIOLOGY REPORT*  Clinical Data: Shortness of breath, dizziness, history hypertension, atrial fibrillation, GERD  CHEST - 2 VIEW  Comparison: 05/07/2006  Findings: Borderline enlargement of cardiac silhouette. Stable mediastinal contours and pulmonary vascularity. Lungs appear emphysematous but clear. No pleural effusion or pneumothorax. Mild scattered endplate spur formation thoracic spine and osseous demineralization noted.  IMPRESSION: Borderline enlargement of cardiac silhouette. Question emphysematous changes. No acute abnormalities.   Original Report Authenticated By: Ulyses Southward, M.D.     1. Hyperkalemia   2. Acute renal failure   3. Hyponatremia       MDM  The patient is possibly critically ill with abnormal electrolytes according to the office of results. I do not have access to these but have been described over the phone by primary doctor. We'll repeat labs, EKG to evaluate for T-wave changes or other signs of hyperkalemia, IV fluids to hydrate as the patient is likely a new onset acute renal failure.   Initial i-stat chem shows K of 7.2 and renal failure - this is c/w reported values from office, meds ordered, cardiac monitroing.  ED ECG REPORT  I personally interpreted this EKG   Date: 10/07/2012   Rate: 65  Rhythm: normal sinus rhythm  QRS Axis: left  Intervals: PR prolonged  ST/T Wave abnormalities: small peaked T waves  Conduction Disutrbances:first-degree A-V block   Narrative Interpretation: QRS prolonged c/w prior  Old EKG Reviewed: QRS prolonged and PR prolonged since prior  Patient treated aggressively for hyperkalemia as there are EKG changes. I discussed his care again with Dr. Timothy Lasso who will come to admit the patient to the hospital.  Critical care being provided for this patient with critically elevated potassium with EKG changes.  Meds given in ED:  Medications  calcium gluconate 1 g in sodium chloride 0.9 % 100 mL IVPB (1 g Intravenous New Bag/Given 10/07/12 1728)  sodium polystyrene (KAYEXALATE) 15 GM/60ML suspension 30 g (30 g Oral Given 10/07/12 1728)  sodium chloride 0.9 % bolus 1,000 mL (1,000 mLs Intravenous New Bag/Given 10/07/12 1728)  sodium bicarbonate injection 50 mEq (50 mEq Intravenous Given 10/07/12 1728)    New Prescriptions   No medications on file      Vida Roller, MD 10/07/12 1740

## 2012-10-08 LAB — BASIC METABOLIC PANEL
CO2: 24 mEq/L (ref 19–32)
Chloride: 99 mEq/L (ref 96–112)
Potassium: 5.2 mEq/L — ABNORMAL HIGH (ref 3.5–5.1)
Sodium: 131 mEq/L — ABNORMAL LOW (ref 135–145)

## 2012-10-08 LAB — COMPREHENSIVE METABOLIC PANEL
CO2: 21 mEq/L (ref 19–32)
Calcium: 9.3 mg/dL (ref 8.4–10.5)
Creatinine, Ser: 1.5 mg/dL — ABNORMAL HIGH (ref 0.50–1.35)
GFR calc Af Amer: 53 mL/min — ABNORMAL LOW (ref >90)
GFR calc non Af Amer: 46 mL/min — ABNORMAL LOW (ref >90)
Glucose, Bld: 116 mg/dL — ABNORMAL HIGH (ref 70–99)
Total Protein: 6.9 g/dL (ref 6.0–8.3)

## 2012-10-08 LAB — CBC
HCT: 31.4 % — ABNORMAL LOW (ref 39.0–52.0)
Hemoglobin: 11.4 g/dL — ABNORMAL LOW (ref 13.0–17.0)
RBC: 3.36 MIL/uL — ABNORMAL LOW (ref 4.22–5.81)

## 2012-10-08 MED ORDER — WHITE PETROLATUM GEL: Status: DC | PRN

## 2012-10-08 MED ORDER — VITAMINS A & D EX OINT
TOPICAL_OINTMENT | CUTANEOUS | Status: AC
  Filled 2012-10-08: qty 5

## 2012-10-08 MED ORDER — WHITE PETROLATUM GEL
Status: DC | PRN
  Administered 2012-10-08: 1 via TOPICAL
  Filled 2012-10-08: qty 5

## 2012-10-08 MED ORDER — ENOXAPARIN SODIUM 40 MG/0.4ML ~~LOC~~ SOLN
40.0000 mg | SUBCUTANEOUS | Status: DC
  Filled 2012-10-08: qty 0.4

## 2012-10-08 NOTE — Progress Notes (Addendum)
Patient's 12-lead EKG has been done and is in the chart. RN decided to do EKG early since his HR has been dropping down into the 40s. Rhythm is sinus brady with 1st degree AV block with premature atrial complexes.

## 2012-10-08 NOTE — Progress Notes (Signed)
Patient's HR has been dropping down to the 40s while he is sleeping. RN has been checking on him several times and each time he has been sound asleep. When aroused his HR goes up into the 60s. RN will continue to monitor HR and rhythm.

## 2012-10-08 NOTE — Progress Notes (Signed)
Subjective: Admitted c ARF/Hyponatremia and Hyperkalemia c EKG changes. K went from 7.2 - 6.1 and now 5.7 Cr down to 1.5 Feels much better - No Dizzy. Urinating well. No more peaked t wakes.  Objective: Vital signs in last 24 hours: Temp:  [98 F (36.7 C)-99 F (37.2 C)] 98 F (36.7 C) (05/23 0510) Pulse Rate:  [52-85] 66 (05/23 0545) Resp:  [16-18] 16 (05/23 0510) BP: (101-139)/(48-67) 101/58 mmHg (05/23 0510) SpO2:  [96 %-100 %] 96 % (05/23 0510) Weight:  [95.255 kg (210 lb)-96.48 kg (212 lb 11.2 oz)] 96.48 kg (212 lb 11.2 oz) (05/22 2024) Weight change:  Last BM Date: 10/07/12  CBG (last 3)  No results found for this basename: GLUCAP,  in the last 72 hours  Intake/Output from previous day:  Intake/Output Summary (Last 24 hours) at 10/08/12 0713 Last data filed at 10/08/12 0630  Gross per 24 hour  Intake 1854.59 ml  Output   1900 ml  Net -45.41 ml   05/22 0701 - 05/23 0700 In: 1854.6 [P.O.:440; I.V.:1414.6] Out: 1900 [Urine:1900]   Physical Exam General appearance: Alert and O Eyes: no scleral icterus Throat: oropharynx moist without erythema Resp: CTA Cardio: Reg c m GI: Obese.  No fluid wave, soft, non-tender; bowel sounds normal; no masses,  no organomegaly Extremities: no clubbing, cyanosis or edema Chronic leg changes noted.   Lab Results:  Recent Labs  10/07/12 2050 10/08/12 0510  NA 129* 129*  K 5.7* 6.1*  CL 93* 97  CO2 23 21  GLUCOSE 88 116*  BUN 60* 49*  CREATININE 1.90* 1.50*  CALCIUM 9.9 9.3     Recent Labs  10/07/12 1646 10/08/12 0510  AST 35 24  ALT 45 36  ALKPHOS 102 86  BILITOT 0.9 1.2  PROT 8.1 6.9  ALBUMIN 4.1 3.7     Recent Labs  10/07/12 1646 10/07/12 1655 10/08/12 0510  WBC 5.7  --  3.7*  NEUTROABS 4.2  --   --   HGB 12.7* 12.9* 11.4*  HCT 35.0* 38.0* 31.4*  MCV 93.8  --  93.5  PLT 130*  --  103*    Lab Results  Component Value Date   INR 1.3* 08/21/2010     Recent Labs  10/07/12 1646  CKTOTAL  38    No results found for this basename: TSH, T4TOTAL, FREET3, T3FREE, THYROIDAB,  in the last 72 hours  No results found for this basename: VITAMINB12, FOLATE, FERRITIN, TIBC, IRON, RETICCTPCT,  in the last 72 hours  Micro Results: No results found for this or any previous visit (from the past 240 hour(s)).   Studies/Results: Dg Chest 2 View  10/07/2012   *RADIOLOGY REPORT*  Clinical Data: Shortness of breath, dizziness, history hypertension, atrial fibrillation, GERD  CHEST - 2 VIEW  Comparison: 05/07/2006  Findings: Borderline enlargement of cardiac silhouette. Stable mediastinal contours and pulmonary vascularity. Lungs appear emphysematous but clear. No pleural effusion or pneumothorax. Mild scattered endplate spur formation thoracic spine and osseous demineralization noted.  IMPRESSION: Borderline enlargement of cardiac silhouette. Question emphysematous changes. No acute abnormalities.   Original Report Authenticated By: Ulyses Southward, M.D.     Medications: Scheduled: . aspirin EC  81 mg Oral Daily  . enoxaparin (LOVENOX) injection  30 mg Subcutaneous Q24H  . metoprolol succinate  25 mg Oral QHS  . multivitamin with minerals  1 tablet Oral QHS  . pantoprazole  40 mg Oral Daily  . [COMPLETED] banana bag IV 1000 mL ** MVI currently unavailable **  Intravenous Once  . sodium chloride  3 mL Intravenous Q12H  . vitamin A & D       Continuous: . sodium chloride Stopped (10/07/12 2246)     Assessment/Plan: Principal Problem:   Hyperkalemia Active Problems:   ALCOHOLISM   HYPERTENSION   Acute renal failure   Orthostatic Dizziness   Cirrhosis, alcoholic   Ascites   1. Acute renal failure with Hyperkalemia- severe hyperkalemia with EKG changes and acute renal failure in the setting of potassium sparing diuretics and ACE inhibitor. His potassium has come down with standard therapy and Cr back near baseline.  Lower IVF and watch today - if Labs even better tomorrow will consider  D/C.  S/P Kayexalate.  Continue to hold lisinopril and Aldactone for now. Due to cirrhosis and Vol issues, he will eventually need Lasix restarted. 2. intravascular volume depletion. S/P IV fluid hydration.  3. Cirrhosis, alcoholic with Ascites- He is at high risk for worsening ascites with discontinuation of his diuretics. He may eventually be able to tolerate Aldactone without an ACE inhibitor. We'll resume Lasix once his acute renal failure is improving.  3. Orthostatic Dizziness- likely secondary to volume depletion in the setting of diuretic therapy. Check orthostatic blood pressures.  4. ALCOHOLISM- he reports that he drinks 3-4 drinks every night. No history of delirium tremens but will need to monitor closely with serial protocol. Ativan as needed for anxiety.  5. HYPERTENSION- will consider alternative to lisinopril if his blood pressure increases significantly with discontinuation.  6. Disposition- anticipate discharge to home in 1-2 days off Lasix/Aldactone and lisinopril once his electrolytes and renal function is stable. I will see him Tuesday of next week and follow up on labs. 7. Bradycardia into 40s while sleeping - ?OSA- Sleep study as outpt.  RN decided to do EKG early this am since his HR had been dropping down into the 40s. Rhythm is sinus brady with 1st degree AV block with premature atrial complexes. No more Hyperkalemic changes. Labs reviewed.  DVT Prophylaxis    LOS: 1 day   Taquan Bralley M 10/08/2012, 7:13 AM

## 2012-10-09 DIAGNOSIS — D61818 Other pancytopenia: Secondary | ICD-10-CM | POA: Diagnosis not present

## 2012-10-09 LAB — COMPREHENSIVE METABOLIC PANEL
ALT: 31 U/L (ref 0–53)
Alkaline Phosphatase: 85 U/L (ref 39–117)
CO2: 22 mEq/L (ref 19–32)
Chloride: 100 mEq/L (ref 96–112)
GFR calc Af Amer: 65 mL/min — ABNORMAL LOW (ref >90)
Glucose, Bld: 121 mg/dL — ABNORMAL HIGH (ref 70–99)
Potassium: 5.3 mEq/L — ABNORMAL HIGH (ref 3.5–5.1)
Sodium: 131 mEq/L — ABNORMAL LOW (ref 135–145)
Total Bilirubin: 1 mg/dL (ref 0.3–1.2)
Total Protein: 6.8 g/dL (ref 6.0–8.3)

## 2012-10-09 LAB — CBC
HCT: 32.2 % — ABNORMAL LOW (ref 39.0–52.0)
MCHC: 34.2 g/dL (ref 30.0–36.0)
Platelets: 94 10*3/uL — ABNORMAL LOW (ref 150–400)
RDW: 13.3 % (ref 11.5–15.5)

## 2012-10-09 MED ORDER — FUROSEMIDE 40 MG PO TABS: 40.0000 mg | ORAL_TABLET | Freq: Every day | ORAL | Status: DC

## 2012-10-09 NOTE — Discharge Summary (Addendum)
Physician Discharge Summary  Patient ID: [name] MRN: [id number] DOB/AGE: 1941-04-23 72 y.o.  Admit date: 10/07/2012 Discharge date: 10/09/2012   Discharge Diagnoses:  Principal Problem:   Hyperkalemia Active Problems:   ALCOHOLISM   Acute renal failure   Other pancytopenia   Orthostatic Dizziness   Cirrhosis, alcoholic   Ascites   HYPERTENSION   Discharged Condition: good  Hospital Course:   [name] is a 72 year old white male with a history of alcoholic cirrhosis with ascites who presented to the emergency department with the complaint of dizziness and elevated potassium. The patient states that he has had chronic dizziness which is worsened over the past several weeks. This morning, while ambulating in the parking lot at [employer] he became very dizzy and had to hold onto the cart to prevent falling. He called Dr. Ferd Hibbs office and was instructed to come in for lab work. Lab work revealed a sodium of 126, potassium 6.9, BUN 72, and creatinine of 2.4. Of note, his potassium was 5.6 on 3/31 with a BUN and creatinine of 31 and 1.3 respectively at that time. He has been maintained on stable doses of Aldactone 100 mg daily and Lasix 40 mg twice a day for his ascites. His last paracentesis was about a year ago. He states that his fluid status has been stable. He is also on lisinopril for his blood pressure which he states has been running a little low. His oral intake has been unchanged. Labs in the emergency department confirmed hyperkalemia with a potassium of 7.2. He has received calcium gluconate, sodium bicarbonate Kayexalate 30 g, and is receiving a liter of normal saline bolus at this time. He states that other than the dizziness he feels well. He was admitted for further treatment of acute renal failure with marked hyperkalemia.  His aldactone and lisinopril were discontinued and he was treated with kayexalate and IV saline. He did well with this with rapid improvement in his  potassium level and renal function. He felt fine on the day of discharge with normal strength and cognition, excellent appetite, and no dyspnea or abdominal discomfort.  Consults: None  Significant Diagnostic Studies:  Dg Chest 2 View  10/07/2012   *RADIOLOGY REPORT*  Clinical Data: Shortness of breath, dizziness, history hypertension, atrial fibrillation, GERD  CHEST - 2 VIEW  Comparison: 05/07/2006  Findings: Borderline enlargement of cardiac silhouette. Stable mediastinal contours and pulmonary vascularity. Lungs appear emphysematous but clear. No pleural effusion or pneumothorax. Mild scattered endplate spur formation thoracic spine and osseous demineralization noted.  IMPRESSION: Borderline enlargement of cardiac silhouette. Question emphysematous changes. No acute abnormalities.   Original Report Authenticated By: Ulyses Southward, M.D.    Labs: Lab Results  Component Value Date   WBC 2.8* 10/09/2012   HGB 11.0* 10/09/2012   HCT 32.2* 10/09/2012   MCV 95.0 10/09/2012   PLT 94* 10/09/2012      Recent Labs Lab 10/09/12 0549  NA 131*  K 5.3*  CL 100  CO2 22  BUN 32*  CREATININE 1.26  CALCIUM 9.2  PROT 6.8  BILITOT 1.0  ALKPHOS 85  ALT 31  AST 22  GLUCOSE 121*       Lab Results  Component Value Date   INR 1.3* 08/21/2010     No results found for this or any previous visit (from the past 240 hour(s)).    Discharge Exam: Blood pressure 110/73, pulse 59, temperature 97.4 F (36.3 C), temperature source Oral, resp. rate 16, height 5' 6.5" (  1.689 m), weight 96.48 kg (212 lb 11.2 oz), SpO2 98.00%.  Physical Exam: In general, he is an overweight white man who was in no apparent distress sitting up in bed. HEENT exam was normal, neck was supple and without JVD, chest was clear to auscultation, heart had a regular rate and rhythm, abdomen had normal bowel sounds and no tenderness, extremities has trace bilateral leg edema. He was alert and well oriented with no focal neurologic signs.    Disposition: He will be discharged to home and should see Dr. Timothy Lasso this coming Tuesday at 4 pm for a followup evaluation. Note that his lisinopril and aldactone have been stopped and lasix reduced to 40 mg once daily. He should monitor his body weights every morning and bring results to Dr. Timothy Lasso at his office visit.       Discharge Orders   Future Orders Complete By Expires     Call MD for:  As directed     Comments:      Call physician for fever, chills, difficulty breathing, or other concerning symptoms    Diet - low sodium heart healthy  As directed     Discharge instructions  As directed     Comments:      Try to avoid adding salt to foods as this will increase fluid retention. No alcohol consumption at all would be best given cirrhosis.    Increase activity slowly  As directed         Medication List    STOP taking these medications       lisinopril 10 MG tablet  Commonly known as:  PRINIVIL,ZESTRIL     milk thistle 175 MG tablet     spironolactone 100 MG tablet  Commonly known as:  ALDACTONE      TAKE these medications       aspirin EC 81 MG tablet  Take 81 mg by mouth daily.     Flax Seed Oil 1000 MG Caps  Take 2 capsules by mouth at bedtime. Takes a combination Flaxseed-Omega Oil now     furosemide 40 MG tablet  Commonly known as:  LASIX  Take 1 tablet (40 mg total) by mouth daily.     multivitamin with minerals Tabs  Take 1 tablet by mouth at bedtime.     omeprazole 20 MG capsule  Commonly known as:  PRILOSEC  Take 20 mg by mouth daily.     TOPROL XL 50 MG 24 hr tablet  Generic drug:  metoprolol succinate  Take 25 mg by mouth at bedtime.       Follow-up Information   Follow up with Gwen Pounds, MD. (has appointment scheduled for Tues 5/27 at 4 pm)    Contact information:   [address] [facility] [place] [address] [phone]       Signed: Garlan Fillers 10/09/2012, 7:46 AM

## 2012-10-22 ENCOUNTER — Other Ambulatory Visit (HOSPITAL_COMMUNITY): Payer: Self-pay | Admitting: Internal Medicine

## 2012-10-22 DIAGNOSIS — R188 Other ascites: Secondary | ICD-10-CM

## 2012-10-25 ENCOUNTER — Ambulatory Visit (HOSPITAL_COMMUNITY)
Admission: RE | Admit: 2012-10-25 | Discharge: 2012-10-25 | Disposition: A | Discharge status: Home / Self Care | Payer: Medicare Other | Source: Ambulatory Visit | Attending: Internal Medicine | Admitting: Internal Medicine

## 2012-10-25 ENCOUNTER — Other Ambulatory Visit (HOSPITAL_COMMUNITY): Payer: Self-pay | Admitting: Internal Medicine

## 2012-10-25 DIAGNOSIS — R188 Other ascites: Secondary | ICD-10-CM

## 2012-10-25 DIAGNOSIS — R141 Gas pain: Secondary | ICD-10-CM | POA: Insufficient documentation

## 2012-10-25 DIAGNOSIS — R142 Eructation: Secondary | ICD-10-CM | POA: Insufficient documentation

## 2012-11-24 ENCOUNTER — Ambulatory Visit: Payer: Medicare Other | Admitting: Gastroenterology

## 2012-12-01 ENCOUNTER — Ambulatory Visit (INDEPENDENT_AMBULATORY_CARE_PROVIDER_SITE_OTHER): Payer: Medicare Other | Admitting: Gastroenterology

## 2012-12-01 ENCOUNTER — Encounter: Payer: Self-pay | Admitting: Gastroenterology

## 2012-12-01 ENCOUNTER — Other Ambulatory Visit (INDEPENDENT_AMBULATORY_CARE_PROVIDER_SITE_OTHER): Payer: Medicare Other

## 2012-12-01 VITALS — BP 124/80 | HR 76 | Ht 66.5 in | Wt 226.0 lb

## 2012-12-01 DIAGNOSIS — R188 Other ascites: Secondary | ICD-10-CM

## 2012-12-01 DIAGNOSIS — K7030 Alcoholic cirrhosis of liver without ascites: Secondary | ICD-10-CM

## 2012-12-01 DIAGNOSIS — K219 Gastro-esophageal reflux disease without esophagitis: Secondary | ICD-10-CM

## 2012-12-01 LAB — BASIC METABOLIC PANEL
Chloride: 97 mEq/L (ref 96–112)
Potassium: 4.3 mEq/L (ref 3.5–5.1)
Sodium: 134 mEq/L — ABNORMAL LOW (ref 135–145)

## 2012-12-01 MED ORDER — OMEPRAZOLE 20 MG PO CPDR: 20.0000 mg | DELAYED_RELEASE_CAPSULE | Freq: Two times a day (BID) | ORAL | Status: DC

## 2012-12-01 NOTE — Patient Instructions (Addendum)
Your physician has requested that you go to the basement for the following lab work before leaving today: AFP, Bmet.  We will also contact you in 6 months to scheduled follow up abdominal ultrasound and AFP tumor marker.  You have been scheduled for an endoscopy with propofol. Please follow written instructions given to you at your visit today. If you use inhalers (even only as needed), please bring them with you on the day of your procedure. Your physician has requested that you go to www.startemmi.com and enter the access code given to you at your visit today. This web site gives a general overview about your procedure. However, you should still follow specific instructions given to you by our office regarding your preparation for the procedure.  Please increase your omeprazole to one tablet by mouth twice daily. A new prescription has been sent to your pharmacy.   Reduce your sodium intake.   Take daily weights on the same scale and at the same time every day. Please call us back if your weight has increased or has not decreased.  Thank you for choosing me and [facility].  Venita Lick. Pleas Koch., MD., Clementeen Graham  cc: [name], MD

## 2012-12-01 NOTE — Progress Notes (Signed)
History of Present Illness: This is a 72 year old male with alcoholism and alcohol cirrhosis. He was recently hospitalized for hyperkalemia related to lisinopril and diuretics. I reviewed records from his hospitalization. His diuretics and lisinopril were temporarily discontinued however he has noted a substantial weight gain from a baseline of about 190 pounds to 230 pounds. Lasix and Aldactone were restarted and he has noted an 8 pound weight loss. He has frequent epigastric pain that is relieved by TUMS. He takes omeprazole on a daily basis. He underwent an abdominal ultrasound on 11/15/2012 showing moderate ascites and right pleural effusion, a small right renal cyst, relatively contracted gallbladder, portions of the pancreas and abdominal aorta were obscured by gas. Abdominal ultrasound performed on 10/25/2012 revealed a very small amount of ascites.  Current Medications, Allergies, Past Medical History, Past Surgical History, Family History and Social History were reviewed in [employer] record.  Physical Exam: General: Well developed , well nourished, no acute distress Head: Normocephalic and atraumatic Eyes:  sclerae anicteric, EOMI Ears: Normal auditory acuity Mouth: No deformity or lesions Lungs: Clear throughout to auscultation Heart: Regular rate and rhythm; no murmurs, rubs or bruits Abdomen: Soft, non tender and moderately distended. Fluid wave and shifting dullness. No masses, hepatosplenomegaly or hernias noted. Normal Bowel sounds Musculoskeletal: Symmetrical with no gross deformities  Pulses:  Normal pulses noted Extremities: No clubbing, cyanosis, or deformities noted. 1-2+ ankle and pretibial edema bilaterally Neurological: Alert oriented x 4, grossly nonfocal Psychological:  Alert and cooperative. Pressured speech. Normal mood and affect  Assessment and Recommendations:  1. Alcoholic cirrhosis. Ascites and peripheral edema. Right pleural effusion  noted which may be secondary to ascites. Screen for hepatoma. Advised to dc etoh. Reduce Na intake. Daily weights. Obtain a BMET and an alpha-fetoprotein. If his weight and abdominal distention do not continue to steadily decrease with diuretics and sodium restriction will proceed with paracentesis.  2. GERD. R/O varices. Increase omeprazole to 20 mg twice a day. Schedule upper endoscopy to evaluate for ulcer disease, esophagitis and to screen for esophageal varices. The risks, benefits, and alternatives to endoscopy with possible biopsy and possible dilation were discussed with the patient and they consent to proceed.

## 2012-12-02 LAB — AFP TUMOR MARKER: AFP-Tumor Marker: 5.3 ng/mL (ref 0.0–8.0)

## 2012-12-08 ENCOUNTER — Telehealth: Payer: Self-pay | Admitting: Gastroenterology

## 2012-12-08 NOTE — Telephone Encounter (Signed)
Reviewed all labs again with the patient

## 2012-12-10 ENCOUNTER — Ambulatory Visit (HOSPITAL_COMMUNITY)
Admission: RE | Admit: 2012-12-10 | Discharge: 2012-12-10 | Disposition: A | Discharge status: Home / Self Care | Payer: Medicare Other | Source: Ambulatory Visit | Attending: Gastroenterology | Admitting: Gastroenterology

## 2012-12-10 ENCOUNTER — Encounter (HOSPITAL_COMMUNITY): Payer: Self-pay

## 2012-12-10 ENCOUNTER — Telehealth: Payer: Self-pay | Admitting: Gastroenterology

## 2012-12-10 ENCOUNTER — Other Ambulatory Visit: Payer: Self-pay

## 2012-12-10 VITALS — BP 132/78 | HR 71 | Temp 98.2°F | Resp 16 | Ht 66.5 in | Wt 226.0 lb

## 2012-12-10 DIAGNOSIS — R188 Other ascites: Secondary | ICD-10-CM | POA: Insufficient documentation

## 2012-12-10 LAB — BODY FLUID CELL COUNT WITH DIFFERENTIAL
Lymphs, Fluid: 73 %
Neutrophil Count, Fluid: 16 % (ref 0–25)
Total Nucleated Cell Count, Fluid: 657 cu mm (ref 0–1000)

## 2012-12-10 LAB — ALBUMIN, FLUID (OTHER): Albumin, Fluid: 2.5 g/dL

## 2012-12-10 MED ORDER — ALBUMIN HUMAN 25 % IV SOLN
12.5000 g | Freq: Once | INTRAVENOUS | Status: DC
  Filled 2012-12-10: qty 50

## 2012-12-10 MED ORDER — SODIUM CHLORIDE 0.9 % IV SOLN
Freq: Once | INTRAVENOUS | Status: AC
  Administered 2012-12-10: 15:00:00 via INTRAVENOUS

## 2012-12-10 MED ORDER — ALBUMIN HUMAN 25 % IV SOLN
13.0000 g | Freq: Once | INTRAVENOUS | Status: DC
  Filled 2012-12-10: qty 60

## 2012-12-10 NOTE — Telephone Encounter (Signed)
Unable to tie his shoes or button his pants due to increased abdominal girth. He is very uncomfortable.  He is SOB.  Would like a paracentesis.  Please advise

## 2012-12-10 NOTE — Progress Notes (Signed)
Uneventful infusion of 12.5 Gm albumin after it was reported 1.3 liters fluid removed via paracentesis in radiology

## 2012-12-10 NOTE — Telephone Encounter (Signed)
I have entered orders and initiated scheduling with Central scheduling.  She will call me back once they can find a time to perfom.

## 2012-12-10 NOTE — Procedures (Signed)
Successful US guided paracentesis from LLQ.  Yielded 1.3 Liters of yellow turbid fluid.  No immediate complications.  Pt tolerated well.   Specimen was sent for labs.  Pattricia Boss D PA-C 12/10/2012 2:18 PM

## 2012-12-10 NOTE — Telephone Encounter (Signed)
He has not been weighing himself daily.  I will set up paracentesis.  Do you want to make changes to his diuretics?

## 2012-12-10 NOTE — Telephone Encounter (Signed)
Patient is scheduled today at 1:00 at [facility].  He is advised to arrive at 12:45.  He is reminded again about the importance of recording daily weights.

## 2012-12-10 NOTE — Telephone Encounter (Signed)
He must weigh himself daily and record on same scale and same time of day. This is the best way to track his fluid volume. It is very difficult to adequately manage ascites without having daily weights. Will not adjust diuretics at this time.

## 2012-12-10 NOTE — Telephone Encounter (Signed)
He was advised to track and record his daily weights. What is the trend?  If weight is not decreasing or is increasing will need to increase diuretic dosing and schedule a large volume paracentesis with IV albumin. Send fluid for WBC, RBC, albumin, C&S

## 2012-12-13 LAB — PATHOLOGIST SMEAR REVIEW

## 2012-12-14 LAB — BODY FLUID CULTURE
Culture: NO GROWTH
Gram Stain: NONE SEEN

## 2012-12-28 ENCOUNTER — Encounter: Payer: Self-pay | Admitting: Gastroenterology

## 2012-12-28 ENCOUNTER — Telehealth: Payer: Self-pay | Admitting: Gastroenterology

## 2012-12-28 ENCOUNTER — Ambulatory Visit (AMBULATORY_SURGERY_CENTER): Payer: Medicare Other | Admitting: Gastroenterology

## 2012-12-28 VITALS — BP 126/74 | HR 78 | Temp 97.6°F | Resp 23 | Ht 66.0 in | Wt 226.0 lb

## 2012-12-28 DIAGNOSIS — R1013 Epigastric pain: Secondary | ICD-10-CM

## 2012-12-28 DIAGNOSIS — D130 Benign neoplasm of esophagus: Secondary | ICD-10-CM

## 2012-12-28 DIAGNOSIS — K2990 Gastroduodenitis, unspecified, without bleeding: Secondary | ICD-10-CM

## 2012-12-28 DIAGNOSIS — K219 Gastro-esophageal reflux disease without esophagitis: Secondary | ICD-10-CM

## 2012-12-28 DIAGNOSIS — K7030 Alcoholic cirrhosis of liver without ascites: Secondary | ICD-10-CM

## 2012-12-28 DIAGNOSIS — K2970 Gastritis, unspecified, without bleeding: Secondary | ICD-10-CM

## 2012-12-28 MED ORDER — SODIUM CHLORIDE 0.9 % IV SOLN: 500.0000 mL | INTRAVENOUS | Status: DC

## 2012-12-28 MED ORDER — OMEPRAZOLE 40 MG PO CPDR: 40.0000 mg | DELAYED_RELEASE_CAPSULE | Freq: Two times a day (BID) | ORAL | Status: DC

## 2012-12-28 MED ORDER — OMEPRAZOLE 40 MG PO CPDR: 40.0000 mg | DELAYED_RELEASE_CAPSULE | Freq: Every day | ORAL | Status: DC

## 2012-12-28 NOTE — Progress Notes (Signed)
Patient did not experience any of the following events: a burn prior to discharge; a fall within the facility; wrong site/side/patient/procedure/implant event; or a hospital transfer or hospital admission upon discharge from the facility. (G8907) Patient did not have preoperative order for IV antibiotic SSI prophylaxis. (G8918)  

## 2012-12-28 NOTE — Patient Instructions (Addendum)
Impressions/recommendations:  Variable z line  Abnormal mucosa in the esophagus Gastropathy  Increase omeprazole from 20 mg twice a day to 40 mg twice a day  Await pathology results.  YOU HAD AN ENDOSCOPIC PROCEDURE TODAY AT THE [facility]: Refer to the procedure report that was given to you for any specific questions about what was found during the examination.  If the procedure report does not answer your questions, please call your gastroenterologist to clarify.  If you requested that your care partner not be given the details of your procedure findings, then the procedure report has been included in a sealed envelope for you to review at your convenience later.  YOU SHOULD EXPECT: Some feelings of bloating in the abdomen. Passage of more gas than usual.  Walking can help get rid of the air that was put into your GI tract during the procedure and reduce the bloating. If you had a lower endoscopy (such as a colonoscopy or flexible sigmoidoscopy) you may notice spotting of blood in your stool or on the toilet paper. If you underwent a bowel prep for your procedure, then you may not have a normal bowel movement for a few days.  DIET: Your first meal following the procedure should be a light meal and then it is ok to progress to your normal diet.  A half-sandwich or bowl of soup is an example of a good first meal.  Heavy or fried foods are harder to digest and may make you feel nauseous or bloated.  Likewise meals heavy in dairy and vegetables can cause extra gas to form and this can also increase the bloating.  Drink plenty of fluids but you should avoid alcoholic beverages for 24 hours.  ACTIVITY: Your care partner should take you home directly after the procedure.  You should plan to take it easy, moving slowly for the rest of the day.  You can resume normal activity the day after the procedure however you should NOT DRIVE or use heavy machinery for 24 hours (because of the sedation  medicines used during the test).    SYMPTOMS TO REPORT IMMEDIATELY: A gastroenterologist can be reached at any hour.  During normal business hours, 8:30 AM to 5:00 PM Monday through Friday, call [phone].  After hours and on weekends, please call the GI answering service at [phone] who will take a message and have the physician on call contact you.   Following upper endoscopy (EGD)  Vomiting of blood or coffee ground material  New chest pain or pain under the shoulder blades  Painful or persistently difficult swallowing  New shortness of breath  Fever of 100F or higher  Black, tarry-looking stools  FOLLOW UP: If any biopsies were taken you will be contacted by phone or by letter within the next 1-3 weeks.  Call your gastroenterologist if you have not heard about the biopsies in 3 weeks.  Our staff will call the home number listed on your records the next business day following your procedure to check on you and address any questions or concerns that you may have at that time regarding the information given to you following your procedure. This is a courtesy call and so if there is no answer at the home number and we have not heard from you through the emergency physician on call, we will assume that you have returned to your regular daily activities without incident.  SIGNATURES/CONFIDENTIALITY: You and/or your care partner have signed paperwork which will be  entered into your electronic medical record.  These signatures attest to the fact that that the information above on your After Visit Summary has been reviewed and is understood.  Full responsibility of the confidentiality of this discharge information lies with you and/or your care-partner. 

## 2012-12-28 NOTE — Progress Notes (Signed)
Called to room to assist during endoscopic procedure.  Patient ID and intended procedure confirmed with present staff. Received instructions for my participation in the procedure from the performing physician.  

## 2012-12-28 NOTE — Telephone Encounter (Signed)
Patient called to inquire about Dr. Russella Dar ordering Lasix. He was reminded that Dr. Timothy Lasso needs to be following through with those orders. This had been discussed at length with him by Dr. Russella Dar and myself during his recovery period. I did realize I had not ordered omeprazole 40 mg for twice daily, ordering only 90 pills for 3 months supply. I called the pharmacy, [facility] and I will resend the prescription ordering omeprazole 40 mg bid, with 1 year of refills. The patient was informed he needed to return to pharmacy 12/29/12. Mark at [facility] will give the patient the additional 90 pills and correct the label without an additional copay. Patient understands and agrees to go to [facility]

## 2012-12-28 NOTE — Progress Notes (Signed)
Lots of mucus suctioned from throat after procedure. vvs to pacu.

## 2012-12-28 NOTE — Op Note (Signed)
[facility] [address] [place], [address]   ENDOSCOPY PROCEDURE REPORT  PATIENT: [name], [name]  MR#: [id number] BIRTHDATE: 09-Jan-1941 , 71  yrs. old GENDER: Male ENDOSCOPIST: Meryl Dare, MD, [facility] PROCEDURE DATE:  12/28/2012 PROCEDURE:  EGD w/ biopsy ASA CLASS:     Class III INDICATIONS:  Epigastric pain.  Esophageal reflux. Cirrhosis-screen for varices. MEDICATIONS: MAC sedation, administered by CRNA and propofol (Diprivan) 250mg  IV TOPICAL ANESTHETIC: none DESCRIPTION OF PROCEDURE: After the risks benefits and alternatives of the procedure were thoroughly explained, informed consent was obtained.  The LB [id number] endoscope was introduced through the mouth and advanced to the second portion of the duodenum  without limitations.  The instrument was slowly withdrawn as the mucosa was fully examined.  ESOPHAGUS: A variable Z-line was observed 40 cm from the incisors. Multiple biopsies were performed.   Abnormal mucosa was found in the lower third of the esophagus. Focal white patch. Multiple biopsies were performed.  The esophagus was otherwise normal. No esophageal varices. STOMACH: Moderate and congested gastropathy was found in the gastric body and gastric fundus.  Multiple biopsies were performed.   The stomach otherwise appeared normal. DUODENUM: The duodenal mucosa showed no abnormalities in the bulb and second portion of the duodenum.  Retroflexed views revealed no abnormalities.  No gastric varices. The scope was then withdrawn from the patient and the procedure completed.  COMPLICATIONS: There were no complications.  ENDOSCOPIC IMPRESSION: 1.   Variable Z-line at 40 cm from the incisors; multiple biopsies 2.   Abnormal mucosa in the lower third of the esophagus; multiple biopsies 3.   Gastropathy in the gastric body and gastric fundus; multiple biopsies  RECOMMENDATIONS: 1.  Anti-reflux regimen 2.  PPI BID: omeprazole 40 mg po  bid, 1 year of refills 3.  Await pathology results   eSigned:  Meryl Dare, MD, [facility] 12/28/2012 1:42 PM   FA:OZHY Timothy Lasso, MD

## 2012-12-29 ENCOUNTER — Telehealth: Payer: Self-pay | Admitting: *Deleted

## 2012-12-29 NOTE — Telephone Encounter (Signed)
States voice mail box full for number left in admitting yesterday and unable to leave message on the number. ewm [phone] am

## 2012-12-30 ENCOUNTER — Telehealth: Payer: Self-pay | Admitting: Gastroenterology

## 2012-12-30 NOTE — Telephone Encounter (Signed)
Patient would like to switch to pantoprazole.  He states that you all discussed switching to other PPI, is it ok to send in pantoprazole.

## 2012-12-30 NOTE — Telephone Encounter (Signed)
Yes

## 2012-12-31 MED ORDER — PANTOPRAZOLE SODIUM 40 MG PO TBEC: 40.0000 mg | DELAYED_RELEASE_TABLET | Freq: Two times a day (BID) | ORAL | Status: DC

## 2012-12-31 NOTE — Telephone Encounter (Signed)
rx sent for 30 days, he will call back if it works for a ninety day supply

## 2013-01-03 ENCOUNTER — Encounter: Payer: Self-pay | Admitting: Gastroenterology

## 2013-01-03 ENCOUNTER — Telehealth: Payer: Self-pay | Admitting: Gastroenterology

## 2013-01-03 NOTE — Telephone Encounter (Signed)
Dr. Russella Dar is it ok for this patient to return to work with no restrictions? Patient advised I will call him with a reply and mail letter if released after Dr. Ardell Isaacs review.

## 2013-01-05 NOTE — Telephone Encounter (Signed)
Patient's employer requires a note to return after EGD.  He will come and pick it up

## 2013-01-10 ENCOUNTER — Telehealth: Payer: Self-pay | Admitting: Gastroenterology

## 2013-01-10 NOTE — Telephone Encounter (Signed)
Reviewed the results of the biopsy.  He is no longer needing to take TUMS for his stomach upset.  He is asking about a diet he needs to follow.  I have mailed him a GERD diet.  He will call back for any questions or concerns

## 2013-01-13 ENCOUNTER — Telehealth: Payer: Self-pay | Admitting: Gastroenterology

## 2013-01-13 NOTE — Telephone Encounter (Signed)
Discussed GERD diet and answered all questions he will call back for additional questions or concerns

## 2013-01-27 ENCOUNTER — Telehealth: Payer: Self-pay | Admitting: Gastroenterology

## 2013-01-27 NOTE — Telephone Encounter (Signed)
Left message for patient to call back  

## 2013-01-28 NOTE — Telephone Encounter (Signed)
Patient c/o abdominal pressure and bloating.  He has tried to modify his diet and has tried Librarian, academic with no improvements. He is scheduled to see Dr. Russella Dar on 02/16/13

## 2013-02-04 ENCOUNTER — Other Ambulatory Visit: Payer: Self-pay | Admitting: Internal Medicine

## 2013-02-04 DIAGNOSIS — R109 Unspecified abdominal pain: Secondary | ICD-10-CM

## 2013-02-07 ENCOUNTER — Ambulatory Visit
Admission: RE | Admit: 2013-02-07 | Discharge: 2013-02-07 | Disposition: A | Discharge status: Home / Self Care | Payer: Medicare Other | Source: Ambulatory Visit | Attending: Internal Medicine | Admitting: Internal Medicine

## 2013-02-07 DIAGNOSIS — R109 Unspecified abdominal pain: Secondary | ICD-10-CM

## 2013-02-15 ENCOUNTER — Other Ambulatory Visit (HOSPITAL_COMMUNITY): Payer: Self-pay | Admitting: Internal Medicine

## 2013-02-15 DIAGNOSIS — R188 Other ascites: Secondary | ICD-10-CM

## 2013-02-16 ENCOUNTER — Other Ambulatory Visit (INDEPENDENT_AMBULATORY_CARE_PROVIDER_SITE_OTHER): Payer: Medicare Other

## 2013-02-16 ENCOUNTER — Ambulatory Visit (INDEPENDENT_AMBULATORY_CARE_PROVIDER_SITE_OTHER): Payer: Medicare Other | Admitting: Gastroenterology

## 2013-02-16 ENCOUNTER — Encounter: Payer: Self-pay | Admitting: Gastroenterology

## 2013-02-16 VITALS — BP 126/80 | HR 80 | Ht 66.5 in | Wt 238.8 lb

## 2013-02-16 DIAGNOSIS — R188 Other ascites: Secondary | ICD-10-CM

## 2013-02-16 DIAGNOSIS — K7460 Unspecified cirrhosis of liver: Secondary | ICD-10-CM

## 2013-02-16 LAB — CBC WITH DIFFERENTIAL/PLATELET
Eosinophils Relative: 1.8 % (ref 0.0–5.0)
HCT: 36.2 % — ABNORMAL LOW (ref 39.0–52.0)
Lymphs Abs: 0.5 10*3/uL — ABNORMAL LOW (ref 0.7–4.0)
Monocytes Relative: 16.5 % — ABNORMAL HIGH (ref 3.0–12.0)
Neutrophils Relative %: 70.7 % (ref 43.0–77.0)
Platelets: 165 10*3/uL (ref 150.0–400.0)
WBC: 4.5 10*3/uL (ref 4.5–10.5)

## 2013-02-16 LAB — BASIC METABOLIC PANEL
BUN: 16 mg/dL (ref 6–23)
GFR: 71.89 mL/min (ref >60.00)
Potassium: 4 mEq/L (ref 3.5–5.1)

## 2013-02-16 LAB — AMMONIA: Ammonia: 19 umol/L (ref 11–35)

## 2013-02-16 MED ORDER — SPIRONOLACTONE 100 MG PO TABS: 100.0000 mg | ORAL_TABLET | Freq: Two times a day (BID) | ORAL | Status: DC

## 2013-02-16 NOTE — Patient Instructions (Addendum)
Your physician has requested that you go to the basement for the following lab work before leaving today: Bmet, CBC.  Increase your Aldactone to 100 mg one tablet by mouth twice daily. A new prescription has been sent to your pharmacy.   Keep appointment for your paracentesis tomorrow arriving at 10:45am.  Stay on a low sodium diet and a brochure has been given. Take daily weights and call our office if your weight his increase or decreased.  We have scheduled you an appointment with Mike Gip, PA on 03/03/13 at 1:30pm.   Thank you for choosing me and [facility].  [name]. Pleas Koch., MD., Clement[facility] Graham  cc: [name], MD

## 2013-02-16 NOTE — Progress Notes (Signed)
History of Present Illness: This is a 72 year old male with cirrhosis and ascites complaining of worsening abdominal swelling, leg swelling and shortness of breath. Despite our prior discussion he is not keeping track of his daily weights and recording them so he is able to report this to [place] and we can intervene more quickly with his fluid management. I'm not sure he is following a low-sodium diet. He admits to drinking a six pack of beer per week. Caffeine has frequent reflux symptoms and early satiety likely related to his worsening ascites. His weight has increased from 190 pounds earlier this summer, to 226 his last office visit, to 238 today.   Current Medications, Allergies, Past Medical History, Past Surgical History, Family History and Social History were reviewed in [employer] record.  Physical Exam: General: Well developed , well nourished, no acute distress Head: Normocephalic and atraumatic Eyes:  sclerae anicteric, EOMI Ears: Normal auditory acuity Mouth: No deformity or lesions Lungs: Clear throughout to auscultation Heart: Regular rate and rhythm; no murmurs, rubs or bruits Abdomen: Soft, non tender and markedly distended. No masses, hepatosplenomegaly or hernias noted. Normal Bowel sounds Musculoskeletal: Symmetrical with no gross deformities  Pulses:  Normal pulses noted Extremities: No clubbing, cyanosis or deformities noted. 3+ edema to his knees. Neurological: Alert oriented x 4, grossly nonfocal. Memory deficits. Psychological:  Alert and cooperative. Normal mood and affect  Assessment and Recommendations:  1. Alcoholic cirrhosis. Worsening ascites, peripheral edema and weight gain. He is now about 50 pounds over his baseline weight. He is not taking and recording his daily weights as previously instructed. Right pleural effusion noted which may be secondary to ascites. Advised to completely dc etoh. Advised to follow a low Na diet. Advised to obtain  and record daily weights. Obtain a BMET, ammonia and CBC today. Large volume paracentesis tomorrow as ordered by Dr. Timothy Lasso. Increase Aldactone to 100 mg bid. Continue Lasix at 40 mg po bid. Follow up in 3 weeks with APP and then 3 weeks after that we me or APP.  2. GERD, gastritis and early satiety. These symptoms are exacerbated by his worsening ascites. Continue pantoprazole 40 mg twice daily.  3. Memory deficits. Obtaining a venous ammonia today. Further evaluation per Dr. Timothy Lasso.

## 2013-02-17 ENCOUNTER — Ambulatory Visit (HOSPITAL_COMMUNITY)
Admission: RE | Admit: 2013-02-17 | Discharge: 2013-02-17 | Disposition: A | Discharge status: Home / Self Care | Payer: Medicare Other | Source: Ambulatory Visit | Attending: Internal Medicine | Admitting: Internal Medicine

## 2013-02-17 VITALS — BP 128/77

## 2013-02-17 DIAGNOSIS — R188 Other ascites: Secondary | ICD-10-CM | POA: Insufficient documentation

## 2013-02-17 NOTE — Procedures (Signed)
US guided therapeutic paracentesis performed yielding 3.5 liters turbid, yellow fluid. No immediate complications.

## 2013-03-01 ENCOUNTER — Other Ambulatory Visit: Payer: Self-pay | Admitting: *Deleted

## 2013-03-03 ENCOUNTER — Ambulatory Visit: Payer: Medicare Other | Admitting: Gastroenterology

## 2013-03-11 ENCOUNTER — Other Ambulatory Visit: Payer: Self-pay

## 2013-03-11 MED ORDER — PANTOPRAZOLE SODIUM 40 MG PO TBEC: 40.0000 mg | DELAYED_RELEASE_TABLET | Freq: Two times a day (BID) | ORAL | Status: DC

## 2013-03-23 ENCOUNTER — Telehealth: Payer: Self-pay | Admitting: Gastroenterology

## 2013-03-23 MED ORDER — OMEPRAZOLE 20 MG PO CPDR: 20.0000 mg | DELAYED_RELEASE_CAPSULE | Freq: Two times a day (BID) | ORAL | Status: AC

## 2013-03-23 NOTE — Telephone Encounter (Signed)
Patient reports that pantoprazole is not controlling his heartburn.  He states that omeprazole worked better.  He states that he is not currently having any problems with ascites.  "I am able to tie my shoes, I can breathe, and all the fluid stayed of my abdomen".  I have sent him a rx for omeproazole.  He will call back for any additional questions or concerns.  Dr. Michail Sermon

## 2013-03-24 ENCOUNTER — Ambulatory Visit: Payer: Medicare Other | Admitting: Gastroenterology

## 2014-01-20 ENCOUNTER — Other Ambulatory Visit (HOSPITAL_COMMUNITY): Payer: Self-pay | Admitting: Internal Medicine

## 2014-01-20 ENCOUNTER — Ambulatory Visit (HOSPITAL_COMMUNITY)
Admission: RE | Admit: 2014-01-20 | Discharge: 2014-01-20 | Disposition: A | Discharge status: Home / Self Care | Payer: Medicare Other | Source: Ambulatory Visit | Attending: Diagnostic Radiology | Admitting: Diagnostic Radiology

## 2014-01-20 DIAGNOSIS — K7460 Unspecified cirrhosis of liver: Secondary | ICD-10-CM

## 2014-01-20 DIAGNOSIS — R188 Other ascites: Secondary | ICD-10-CM | POA: Diagnosis not present

## 2014-01-20 NOTE — Progress Notes (Signed)
Patient ID: [name], male   DOB: [birth year], 73 y.o.   MRN: [id number] Pt presented to [place] dept today for therapeutic paracentesis. On limited US abd in all four quadrants there is only minimal ascites present. Procedure was cancelled. Pt aware.

## 2014-05-25 DIAGNOSIS — I10 Essential (primary) hypertension: Secondary | ICD-10-CM | POA: Diagnosis not present

## 2014-05-25 DIAGNOSIS — H9191 Unspecified hearing loss, right ear: Secondary | ICD-10-CM | POA: Diagnosis not present

## 2014-05-25 DIAGNOSIS — K5900 Constipation, unspecified: Secondary | ICD-10-CM | POA: Diagnosis not present

## 2014-05-25 DIAGNOSIS — H6121 Impacted cerumen, right ear: Secondary | ICD-10-CM | POA: Diagnosis not present

## 2014-07-27 DIAGNOSIS — R5383 Other fatigue: Secondary | ICD-10-CM | POA: Diagnosis not present

## 2014-07-27 DIAGNOSIS — R42 Dizziness and giddiness: Secondary | ICD-10-CM | POA: Diagnosis not present

## 2014-07-27 DIAGNOSIS — E871 Hypo-osmolality and hyponatremia: Secondary | ICD-10-CM | POA: Diagnosis not present

## 2014-07-27 DIAGNOSIS — I10 Essential (primary) hypertension: Secondary | ICD-10-CM | POA: Diagnosis not present

## 2014-07-27 DIAGNOSIS — Z6834 Body mass index (BMI) 34.0-34.9, adult: Secondary | ICD-10-CM | POA: Diagnosis not present

## 2014-08-08 DIAGNOSIS — E785 Hyperlipidemia, unspecified: Secondary | ICD-10-CM | POA: Diagnosis not present

## 2014-08-08 DIAGNOSIS — K5900 Constipation, unspecified: Secondary | ICD-10-CM | POA: Diagnosis not present

## 2014-08-08 DIAGNOSIS — I4892 Unspecified atrial flutter: Secondary | ICD-10-CM | POA: Diagnosis not present

## 2014-08-08 DIAGNOSIS — F1010 Alcohol abuse, uncomplicated: Secondary | ICD-10-CM | POA: Diagnosis not present

## 2014-08-08 DIAGNOSIS — I10 Essential (primary) hypertension: Secondary | ICD-10-CM | POA: Diagnosis not present

## 2014-08-08 DIAGNOSIS — E669 Obesity, unspecified: Secondary | ICD-10-CM | POA: Diagnosis not present

## 2014-08-08 DIAGNOSIS — K7460 Unspecified cirrhosis of liver: Secondary | ICD-10-CM | POA: Diagnosis not present

## 2014-08-08 DIAGNOSIS — Z1389 Encounter for screening for other disorder: Secondary | ICD-10-CM | POA: Diagnosis not present

## 2014-08-31 DIAGNOSIS — R55 Syncope and collapse: Secondary | ICD-10-CM | POA: Diagnosis not present

## 2014-09-19 DIAGNOSIS — E876 Hypokalemia: Secondary | ICD-10-CM | POA: Diagnosis not present

## 2015-01-23 DIAGNOSIS — I10 Essential (primary) hypertension: Secondary | ICD-10-CM | POA: Diagnosis not present

## 2015-01-23 DIAGNOSIS — E119 Type 2 diabetes mellitus without complications: Secondary | ICD-10-CM | POA: Diagnosis not present

## 2015-01-23 DIAGNOSIS — Z6833 Body mass index (BMI) 33.0-33.9, adult: Secondary | ICD-10-CM | POA: Diagnosis not present

## 2015-02-15 DIAGNOSIS — E876 Hypokalemia: Secondary | ICD-10-CM | POA: Diagnosis not present

## 2015-03-15 DIAGNOSIS — I10 Essential (primary) hypertension: Secondary | ICD-10-CM | POA: Diagnosis not present

## 2015-03-15 DIAGNOSIS — Z0000 Encounter for general adult medical examination without abnormal findings: Secondary | ICD-10-CM | POA: Diagnosis not present

## 2015-03-15 DIAGNOSIS — E785 Hyperlipidemia, unspecified: Secondary | ICD-10-CM | POA: Diagnosis not present

## 2015-03-15 DIAGNOSIS — E1122 Type 2 diabetes mellitus with diabetic chronic kidney disease: Secondary | ICD-10-CM | POA: Diagnosis not present

## 2015-03-20 DIAGNOSIS — G629 Polyneuropathy, unspecified: Secondary | ICD-10-CM | POA: Diagnosis not present

## 2015-03-20 DIAGNOSIS — N183 Chronic kidney disease, stage 3 (moderate): Secondary | ICD-10-CM | POA: Diagnosis not present

## 2015-03-20 DIAGNOSIS — Z0000 Encounter for general adult medical examination without abnormal findings: Secondary | ICD-10-CM | POA: Diagnosis not present

## 2015-03-20 DIAGNOSIS — H9191 Unspecified hearing loss, right ear: Secondary | ICD-10-CM | POA: Diagnosis not present

## 2015-03-20 DIAGNOSIS — E119 Type 2 diabetes mellitus without complications: Secondary | ICD-10-CM | POA: Diagnosis not present

## 2015-03-20 DIAGNOSIS — E1122 Type 2 diabetes mellitus with diabetic chronic kidney disease: Secondary | ICD-10-CM | POA: Diagnosis not present

## 2015-03-20 DIAGNOSIS — F3341 Major depressive disorder, recurrent, in partial remission: Secondary | ICD-10-CM | POA: Diagnosis not present

## 2015-03-20 DIAGNOSIS — F410 Panic disorder [episodic paroxysmal anxiety] without agoraphobia: Secondary | ICD-10-CM | POA: Diagnosis not present

## 2015-04-06 DIAGNOSIS — R1012 Left upper quadrant pain: Secondary | ICD-10-CM | POA: Diagnosis not present

## 2015-04-06 DIAGNOSIS — R188 Other ascites: Secondary | ICD-10-CM | POA: Diagnosis not present

## 2015-04-06 DIAGNOSIS — Z6836 Body mass index (BMI) 36.0-36.9, adult: Secondary | ICD-10-CM | POA: Diagnosis not present

## 2015-04-06 DIAGNOSIS — K219 Gastro-esophageal reflux disease without esophagitis: Secondary | ICD-10-CM | POA: Diagnosis not present

## 2015-04-30 ENCOUNTER — Other Ambulatory Visit: Payer: Self-pay | Admitting: Internal Medicine

## 2015-04-30 DIAGNOSIS — R1012 Left upper quadrant pain: Secondary | ICD-10-CM

## 2015-05-02 ENCOUNTER — Other Ambulatory Visit: Payer: Self-pay | Admitting: Internal Medicine

## 2015-05-02 DIAGNOSIS — R1012 Left upper quadrant pain: Secondary | ICD-10-CM

## 2015-05-07 DIAGNOSIS — E119 Type 2 diabetes mellitus without complications: Secondary | ICD-10-CM | POA: Diagnosis not present

## 2015-05-09 ENCOUNTER — Other Ambulatory Visit: Payer: Self-pay

## 2015-05-22 ENCOUNTER — Other Ambulatory Visit: Payer: Self-pay

## 2015-05-22 ENCOUNTER — Ambulatory Visit
Admission: RE | Admit: 2015-05-22 | Discharge: 2015-05-22 | Disposition: A | Discharge status: Home / Self Care | Payer: Self-pay | Source: Ambulatory Visit | Attending: Internal Medicine | Admitting: Internal Medicine

## 2015-05-22 DIAGNOSIS — R1012 Left upper quadrant pain: Secondary | ICD-10-CM

## 2015-06-19 DIAGNOSIS — Z6837 Body mass index (BMI) 37.0-37.9, adult: Secondary | ICD-10-CM | POA: Diagnosis not present

## 2015-06-19 DIAGNOSIS — E1122 Type 2 diabetes mellitus with diabetic chronic kidney disease: Secondary | ICD-10-CM | POA: Diagnosis not present

## 2015-06-19 DIAGNOSIS — L209 Atopic dermatitis, unspecified: Secondary | ICD-10-CM | POA: Diagnosis not present

## 2015-07-17 DIAGNOSIS — Z6837 Body mass index (BMI) 37.0-37.9, adult: Secondary | ICD-10-CM | POA: Diagnosis not present

## 2015-07-17 DIAGNOSIS — I10 Essential (primary) hypertension: Secondary | ICD-10-CM | POA: Diagnosis not present

## 2015-07-17 DIAGNOSIS — F1010 Alcohol abuse, uncomplicated: Secondary | ICD-10-CM | POA: Diagnosis not present

## 2015-07-17 DIAGNOSIS — E1122 Type 2 diabetes mellitus with diabetic chronic kidney disease: Secondary | ICD-10-CM | POA: Diagnosis not present

## 2015-07-17 DIAGNOSIS — E669 Obesity, unspecified: Secondary | ICD-10-CM | POA: Diagnosis not present

## 2015-07-17 DIAGNOSIS — R1012 Left upper quadrant pain: Secondary | ICD-10-CM | POA: Diagnosis not present

## 2015-07-17 DIAGNOSIS — N183 Chronic kidney disease, stage 3 (moderate): Secondary | ICD-10-CM | POA: Diagnosis not present

## 2015-07-24 DIAGNOSIS — M940 Chondrocostal junction syndrome [Tietze]: Secondary | ICD-10-CM | POA: Diagnosis not present

## 2015-07-24 DIAGNOSIS — M6208 Separation of muscle (nontraumatic), other site: Secondary | ICD-10-CM | POA: Diagnosis not present

## 2015-08-01 DIAGNOSIS — K219 Gastro-esophageal reflux disease without esophagitis: Secondary | ICD-10-CM | POA: Diagnosis not present

## 2015-08-01 DIAGNOSIS — Z6837 Body mass index (BMI) 37.0-37.9, adult: Secondary | ICD-10-CM | POA: Diagnosis not present

## 2015-08-01 DIAGNOSIS — F4310 Post-traumatic stress disorder, unspecified: Secondary | ICD-10-CM | POA: Diagnosis not present

## 2015-08-01 DIAGNOSIS — R05 Cough: Secondary | ICD-10-CM | POA: Diagnosis not present

## 2015-08-01 DIAGNOSIS — R0602 Shortness of breath: Secondary | ICD-10-CM | POA: Diagnosis not present

## 2015-08-07 ENCOUNTER — Other Ambulatory Visit (HOSPITAL_COMMUNITY): Payer: Self-pay | Admitting: Internal Medicine

## 2015-08-07 DIAGNOSIS — R0602 Shortness of breath: Secondary | ICD-10-CM

## 2015-08-07 DIAGNOSIS — R05 Cough: Secondary | ICD-10-CM | POA: Diagnosis not present

## 2015-08-07 DIAGNOSIS — J90 Pleural effusion, not elsewhere classified: Secondary | ICD-10-CM

## 2015-08-09 ENCOUNTER — Other Ambulatory Visit: Payer: Self-pay

## 2015-08-09 ENCOUNTER — Ambulatory Visit (HOSPITAL_COMMUNITY): Payer: Commercial Managed Care - HMO | Attending: Cardiology

## 2015-08-09 DIAGNOSIS — R0602 Shortness of breath: Secondary | ICD-10-CM | POA: Insufficient documentation

## 2015-08-09 DIAGNOSIS — I340 Nonrheumatic mitral (valve) insufficiency: Secondary | ICD-10-CM | POA: Diagnosis not present

## 2015-08-09 DIAGNOSIS — I351 Nonrheumatic aortic (valve) insufficiency: Secondary | ICD-10-CM | POA: Diagnosis not present

## 2015-08-09 DIAGNOSIS — K7460 Unspecified cirrhosis of liver: Secondary | ICD-10-CM | POA: Insufficient documentation

## 2015-08-09 DIAGNOSIS — J90 Pleural effusion, not elsewhere classified: Secondary | ICD-10-CM | POA: Diagnosis not present

## 2015-08-20 DIAGNOSIS — R05 Cough: Secondary | ICD-10-CM | POA: Diagnosis not present

## 2015-08-20 DIAGNOSIS — J309 Allergic rhinitis, unspecified: Secondary | ICD-10-CM | POA: Diagnosis not present

## 2015-09-18 DIAGNOSIS — I4892 Unspecified atrial flutter: Secondary | ICD-10-CM | POA: Diagnosis not present

## 2015-09-18 DIAGNOSIS — E1122 Type 2 diabetes mellitus with diabetic chronic kidney disease: Secondary | ICD-10-CM | POA: Diagnosis not present

## 2015-09-18 DIAGNOSIS — J90 Pleural effusion, not elsewhere classified: Secondary | ICD-10-CM | POA: Diagnosis not present

## 2015-09-18 DIAGNOSIS — N183 Chronic kidney disease, stage 3 (moderate): Secondary | ICD-10-CM | POA: Diagnosis not present

## 2015-09-18 DIAGNOSIS — Z6835 Body mass index (BMI) 35.0-35.9, adult: Secondary | ICD-10-CM | POA: Diagnosis not present

## 2015-09-18 DIAGNOSIS — K219 Gastro-esophageal reflux disease without esophagitis: Secondary | ICD-10-CM | POA: Diagnosis not present

## 2015-09-18 DIAGNOSIS — R1012 Left upper quadrant pain: Secondary | ICD-10-CM | POA: Diagnosis not present

## 2015-09-18 DIAGNOSIS — E668 Other obesity: Secondary | ICD-10-CM | POA: Diagnosis not present

## 2015-09-18 DIAGNOSIS — F1010 Alcohol abuse, uncomplicated: Secondary | ICD-10-CM | POA: Diagnosis not present

## 2015-11-19 DIAGNOSIS — R05 Cough: Secondary | ICD-10-CM | POA: Diagnosis not present

## 2015-12-17 DIAGNOSIS — F410 Panic disorder [episodic paroxysmal anxiety] without agoraphobia: Secondary | ICD-10-CM | POA: Diagnosis not present

## 2015-12-17 DIAGNOSIS — I4892 Unspecified atrial flutter: Secondary | ICD-10-CM | POA: Diagnosis not present

## 2015-12-17 DIAGNOSIS — N183 Chronic kidney disease, stage 3 (moderate): Secondary | ICD-10-CM | POA: Diagnosis not present

## 2015-12-17 DIAGNOSIS — E1122 Type 2 diabetes mellitus with diabetic chronic kidney disease: Secondary | ICD-10-CM | POA: Diagnosis not present

## 2015-12-17 DIAGNOSIS — K7460 Unspecified cirrhosis of liver: Secondary | ICD-10-CM | POA: Diagnosis not present

## 2015-12-17 DIAGNOSIS — E871 Hypo-osmolality and hyponatremia: Secondary | ICD-10-CM | POA: Diagnosis not present

## 2015-12-17 DIAGNOSIS — K766 Portal hypertension: Secondary | ICD-10-CM | POA: Diagnosis not present

## 2015-12-17 DIAGNOSIS — F1010 Alcohol abuse, uncomplicated: Secondary | ICD-10-CM | POA: Diagnosis not present

## 2015-12-17 DIAGNOSIS — F3341 Major depressive disorder, recurrent, in partial remission: Secondary | ICD-10-CM | POA: Diagnosis not present

## 2015-12-21 DIAGNOSIS — I868 Varicose veins of other specified sites: Secondary | ICD-10-CM | POA: Diagnosis not present

## 2015-12-21 DIAGNOSIS — M79605 Pain in left leg: Secondary | ICD-10-CM | POA: Diagnosis not present

## 2015-12-21 DIAGNOSIS — Z6835 Body mass index (BMI) 35.0-35.9, adult: Secondary | ICD-10-CM | POA: Diagnosis not present

## 2015-12-21 DIAGNOSIS — E1122 Type 2 diabetes mellitus with diabetic chronic kidney disease: Secondary | ICD-10-CM | POA: Diagnosis not present

## 2015-12-21 DIAGNOSIS — I10 Essential (primary) hypertension: Secondary | ICD-10-CM | POA: Diagnosis not present

## 2015-12-25 ENCOUNTER — Other Ambulatory Visit: Payer: Self-pay | Admitting: Vascular Surgery

## 2015-12-25 DIAGNOSIS — I83812 Varicose veins of left lower extremities with pain: Secondary | ICD-10-CM

## 2015-12-28 ENCOUNTER — Encounter (HOSPITAL_COMMUNITY): Payer: Commercial Managed Care - HMO

## 2015-12-31 ENCOUNTER — Ambulatory Visit (HOSPITAL_COMMUNITY)
Admission: RE | Admit: 2015-12-31 | Discharge: 2015-12-31 | Disposition: A | Discharge status: Home / Self Care | Payer: Commercial Managed Care - HMO | Source: Ambulatory Visit | Attending: Vascular Surgery | Admitting: Vascular Surgery

## 2015-12-31 DIAGNOSIS — E785 Hyperlipidemia, unspecified: Secondary | ICD-10-CM | POA: Insufficient documentation

## 2015-12-31 DIAGNOSIS — I10 Essential (primary) hypertension: Secondary | ICD-10-CM | POA: Insufficient documentation

## 2015-12-31 DIAGNOSIS — R609 Edema, unspecified: Secondary | ICD-10-CM | POA: Diagnosis present

## 2015-12-31 DIAGNOSIS — K219 Gastro-esophageal reflux disease without esophagitis: Secondary | ICD-10-CM | POA: Insufficient documentation

## 2015-12-31 DIAGNOSIS — I83812 Varicose veins of left lower extremities with pain: Secondary | ICD-10-CM | POA: Diagnosis not present

## 2015-12-31 DIAGNOSIS — F419 Anxiety disorder, unspecified: Secondary | ICD-10-CM | POA: Insufficient documentation

## 2016-01-01 ENCOUNTER — Encounter: Payer: Self-pay | Admitting: Vascular Surgery

## 2016-01-07 ENCOUNTER — Encounter: Payer: Self-pay | Admitting: Vascular Surgery

## 2016-01-07 ENCOUNTER — Ambulatory Visit (INDEPENDENT_AMBULATORY_CARE_PROVIDER_SITE_OTHER): Payer: Commercial Managed Care - HMO | Admitting: Vascular Surgery

## 2016-01-07 VITALS — BP 136/81 | HR 83 | Temp 99.7°F | Resp 18 | Ht 67.0 in | Wt 212.0 lb

## 2016-01-07 DIAGNOSIS — I83893 Varicose veins of bilateral lower extremities with other complications: Secondary | ICD-10-CM | POA: Diagnosis not present

## 2016-01-07 NOTE — Progress Notes (Signed)
Subjective:     Patient ID: [name], male   DOB: Jul 10, 1940, 75 y.o.   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 Patient had bilateral laser ablation procedures by me in 2012 with multiple stab phlebectomy's of painful varicosities in the left leg. He did well for several years but over the past few years has developed aching and throbbing discomfort in the distal medial thigh areas bilaterally. He is also developed significant recurrent varicosities left leg worse than right. He did bump his leg recently and developed some bleeding from a varix in the left pretibial region. His swelling is about the same as it was before. He has no history of DVT thrombophlebitis. He has not been wearing elastic compression stockings recently. He is having significant discomfort however.  Past Medical History:  Diagnosis Date  . Adenomatous polyps 06/2004  . Alcoholic cirrhosis of liver with ascites ([place])   . Alcoholism ([place])   . Anxiety    PTSD- no meds  . Atrial fibrillation ([place])   . Atrial flutter ([place])   . Diabetes mellitus without complication ([place])   . Diverticulosis   . GERD (gastroesophageal reflux disease)   . Hemorrhoids   . Hiatal hernia   . Hx of hernia repair   . Hyperlipidemia   . Hypertension   . Pedal edema   . Ulcer   . Varicose veins     Social History  Substance Use Topics  . Smoking status: Former Smoker    Types: Cigarettes    Quit date: 01/16/2011  . Smokeless tobacco: Never Used  . Alcohol use 0.0 oz/week    12 - 15 Cans of beer per week     Comment: 4-5 [place] daily    Family History  Problem Relation Age of Onset  . Colon polyps Brother   . Prostate cancer Brother   . Heart disease Father     rheumatic fever as child  . Stroke Mother   . Colon cancer Neg Hx   . Esophageal cancer Neg Hx   . Rectal cancer Neg Hx   . Stomach cancer Neg Hx     Allergies  Allergen Reactions  . Lisinopril Anaphylaxis   Hyperkalemia, Dizziness   . Atorvastatin     REACTION: Marked leg fatigue  . Sulfonamide Derivatives     Unsure of reaction     Current Outpatient Prescriptions:  .  aspirin EC 81 MG tablet, Take 81 mg by mouth daily., Disp: , Rfl:  .  calcium carbonate (TUMS EX) 750 MG chewable tablet, Chew 1 tablet by mouth as needed for heartburn., Disp: , Rfl:  .  furosemide (LASIX) 40 MG tablet, Take 40 mg by mouth 2 (two) times daily., Disp: , Rfl:  .  hydrALAZINE (APRESOLINE) 25 MG tablet, Take 25 mg by mouth as needed. , Disp: , Rfl:  .  metoprolol (TOPROL XL) 50 MG 24 hr tablet, Take 25 mg by mouth at bedtime. , Disp: , Rfl:  .  Multiple Vitamin (MULTIVITAMIN WITH MINERALS) TABS, Take 1 tablet by mouth at bedtime., Disp: , Rfl:  .  Omega-3 Fatty Acids (FISH OIL) 1000 MG CAPS, Take 1 capsule by mouth at bedtime., Disp: , Rfl:  .  omeprazole (PRILOSEC) 20 MG capsule, Take 1 capsule (20 mg total) by mouth 2 (two) times daily., Disp: 60 capsule, Rfl: 5 .  Probiotic Product (ALIGN) 4 MG CAPS, Take 1 capsule by mouth daily., Disp: ,  Rfl:  .  spironolactone (ALDACTONE) 100 MG tablet, Take 1 tablet (100 mg total) by mouth 2 (two) times daily., Disp: 60 tablet, Rfl: 5  Vitals:   01/07/16 1244  BP: 136/81  Pulse: 83  Resp: 18  Temp: 99.7 F (37.6 C)  SpO2: 98%  Weight: 212 lb (96.2 kg)  Height: '5\' 7"'$  (1.702 m)    Body mass index is 33.2 kg/m.          Review of Systems denies chest pain does have chronic dyspnea on exertion. Denies hemoptysis. Has type 2 diabetes mellitus. Has history of erosive's, diverticulosis, bilateral chronic venous insufficiency. Other systems negative and complete review of systems     Objective:   Physical Exam BP 136/81 (BP Location: Left Arm, Patient Position: Sitting, Cuff Size: Normal)   Pulse 83   Temp 99.7 F (37.6 C)   Resp 18   Ht '5\' 7"'$  (1.702 m)   Wt 212 lb (96.2 kg)   SpO2 98%   BMI 33.20 kg/m     Gen.-alert and oriented x3 in no apparent  distress HEENT normal for age Lungs no rhonchi or wheezing Cardiovascular regular rhythm no murmurs carotid pulses 3+ palpable no bruits audible Abdomen soft nontender no palpable masses Musculoskeletal free of  major deformities Skin clear -no rashes-extensive bulging varicosities left leg beginning in the anterior proximal thigh extending medially and into the medial calf with chronic 1+ edema and hyperpigmentation lower third both legs. Recurrent varicosities medial thigh and calf and right leg. No active ulceration noted. Neurologic normal Lower extremities 3+ femoral and dorsalis pedis pulses palpable bilaterally with 1-2+ edema bilaterally.  Patient had recent venous duplex exam which I reviewed and interpreted in our lab. 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 There is however a patent anterior accessory branch which is quite large. There is no DVT.  Today I performed a independent bedside sono site exam of the left leg. 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 This is causing symptoms and left leg of aching throbbing and burning discomfort which is affecting patient's daily living.  Plan:         #1 long leg elastic compression stockings 20-30 mm gradient #2 elevate legs as much as possible #3 ibuprofen daily on a regular basis for pain #4 return in 3 months-if no significant improvement then he will need laser ablation of the proximal left great saphenous vein followed by three-month waiting. And then be evaluated for possible multiple stab phlebectomy-greater than 20. Return in 3 months

## 2016-02-17 ENCOUNTER — Emergency Department (HOSPITAL_COMMUNITY): Payer: Commercial Managed Care - HMO

## 2016-02-17 ENCOUNTER — Observation Stay (HOSPITAL_COMMUNITY)
Admission: EM | Admit: 2016-02-17 | Discharge: 2016-02-19 | Disposition: A | Discharge status: Home / Self Care | Payer: Commercial Managed Care - HMO | Attending: Internal Medicine | Admitting: Internal Medicine

## 2016-02-17 ENCOUNTER — Encounter (HOSPITAL_COMMUNITY): Payer: Self-pay | Admitting: Emergency Medicine

## 2016-02-17 DIAGNOSIS — Z79899 Other long term (current) drug therapy: Secondary | ICD-10-CM | POA: Insufficient documentation

## 2016-02-17 DIAGNOSIS — K219 Gastro-esophageal reflux disease without esophagitis: Secondary | ICD-10-CM

## 2016-02-17 DIAGNOSIS — K7030 Alcoholic cirrhosis of liver without ascites: Secondary | ICD-10-CM | POA: Diagnosis present

## 2016-02-17 DIAGNOSIS — E1165 Type 2 diabetes mellitus with hyperglycemia: Secondary | ICD-10-CM | POA: Diagnosis not present

## 2016-02-17 DIAGNOSIS — R55 Syncope and collapse: Secondary | ICD-10-CM | POA: Diagnosis not present

## 2016-02-17 DIAGNOSIS — E876 Hypokalemia: Secondary | ICD-10-CM | POA: Diagnosis present

## 2016-02-17 DIAGNOSIS — Z87891 Personal history of nicotine dependence: Secondary | ICD-10-CM | POA: Diagnosis not present

## 2016-02-17 DIAGNOSIS — Z7982 Long term (current) use of aspirin: Secondary | ICD-10-CM | POA: Diagnosis not present

## 2016-02-17 DIAGNOSIS — J90 Pleural effusion, not elsewhere classified: Secondary | ICD-10-CM | POA: Diagnosis not present

## 2016-02-17 DIAGNOSIS — E871 Hypo-osmolality and hyponatremia: Secondary | ICD-10-CM | POA: Diagnosis present

## 2016-02-17 DIAGNOSIS — I951 Orthostatic hypotension: Secondary | ICD-10-CM | POA: Diagnosis not present

## 2016-02-17 DIAGNOSIS — R404 Transient alteration of awareness: Secondary | ICD-10-CM | POA: Diagnosis not present

## 2016-02-17 DIAGNOSIS — D509 Iron deficiency anemia, unspecified: Secondary | ICD-10-CM | POA: Diagnosis not present

## 2016-02-17 DIAGNOSIS — I4891 Unspecified atrial fibrillation: Secondary | ICD-10-CM | POA: Diagnosis not present

## 2016-02-17 DIAGNOSIS — Z9114 Patient's other noncompliance with medication regimen: Secondary | ICD-10-CM | POA: Insufficient documentation

## 2016-02-17 DIAGNOSIS — R110 Nausea: Secondary | ICD-10-CM | POA: Diagnosis not present

## 2016-02-17 DIAGNOSIS — K7031 Alcoholic cirrhosis of liver with ascites: Secondary | ICD-10-CM | POA: Diagnosis not present

## 2016-02-17 DIAGNOSIS — I10 Essential (primary) hypertension: Secondary | ICD-10-CM | POA: Diagnosis not present

## 2016-02-17 LAB — URINALYSIS, ROUTINE W REFLEX MICROSCOPIC
BILIRUBIN URINE: NEGATIVE
Glucose, UA: NEGATIVE mg/dL
Hgb urine dipstick: NEGATIVE
Ketones, ur: NEGATIVE mg/dL
Leukocytes, UA: NEGATIVE
NITRITE: NEGATIVE
PROTEIN: NEGATIVE mg/dL
SPECIFIC GRAVITY, URINE: 1.013 (ref 1.005–1.030)
pH: 6 (ref 5.0–8.0)

## 2016-02-17 LAB — CBC
HCT: 32.6 % — ABNORMAL LOW (ref 39.0–52.0)
HEMOGLOBIN: 10.1 g/dL — AB (ref 13.0–17.0)
MCH: 23.5 pg — ABNORMAL LOW (ref 26.0–34.0)
MCHC: 31 g/dL (ref 30.0–36.0)
MCV: 75.8 fL — ABNORMAL LOW (ref 78.0–100.0)
Platelets: 177 10*3/uL (ref 150–400)
RBC: 4.3 MIL/uL (ref 4.22–5.81)
RDW: 17.6 % — ABNORMAL HIGH (ref 11.5–15.5)
WBC: 4.3 10*3/uL (ref 4.0–10.5)

## 2016-02-17 LAB — COMPREHENSIVE METABOLIC PANEL
ALT: 14 U/L — AB (ref 17–63)
AST: 20 U/L (ref 15–41)
Albumin: 3.5 g/dL (ref 3.5–5.0)
Alkaline Phosphatase: 101 U/L (ref 38–126)
Anion gap: 7 (ref 5–15)
BUN: 17 mg/dL (ref 6–20)
CHLORIDE: 90 mmol/L — AB (ref 101–111)
CO2: 27 mmol/L (ref 22–32)
CREATININE: 1.08 mg/dL (ref 0.61–1.24)
Calcium: 8.7 mg/dL — ABNORMAL LOW (ref 8.9–10.3)
GFR calc non Af Amer: >60 mL/min (ref >60)
Glucose, Bld: 189 mg/dL — ABNORMAL HIGH (ref 65–99)
POTASSIUM: 3.4 mmol/L — AB (ref 3.5–5.1)
SODIUM: 124 mmol/L — AB (ref 135–145)
Total Bilirubin: 1.6 mg/dL — ABNORMAL HIGH (ref 0.3–1.2)
Total Protein: 6.8 g/dL (ref 6.5–8.1)

## 2016-02-17 LAB — TROPONIN I: Troponin I: <0.03 ng/mL (ref <0.03)

## 2016-02-17 LAB — GLUCOSE, CAPILLARY
Glucose-Capillary: 150 mg/dL — ABNORMAL HIGH (ref 65–99)
Glucose-Capillary: 169 mg/dL — ABNORMAL HIGH (ref 65–99)
Glucose-Capillary: 145 mg/dL — ABNORMAL HIGH (ref 65–99)
Glucose-Capillary: 142 mg/dL — ABNORMAL HIGH (ref 65–99)

## 2016-02-17 LAB — BASIC METABOLIC PANEL
ANION GAP: 6 (ref 5–15)
BUN: 16 mg/dL (ref 6–20)
CHLORIDE: 92 mmol/L — AB (ref 101–111)
CO2: 26 mmol/L (ref 22–32)
Calcium: 8.4 mg/dL — ABNORMAL LOW (ref 8.9–10.3)
Creatinine, Ser: 1.07 mg/dL (ref 0.61–1.24)
GFR calc Af Amer: >60 mL/min (ref >60)
Glucose, Bld: 161 mg/dL — ABNORMAL HIGH (ref 65–99)
POTASSIUM: 3.7 mmol/L (ref 3.5–5.1)
SODIUM: 124 mmol/L — AB (ref 135–145)

## 2016-02-17 LAB — I-STAT TROPONIN, ED: Troponin i, poc: 0.01 ng/mL (ref 0.00–0.08)

## 2016-02-17 LAB — CBG MONITORING, ED: GLUCOSE-CAPILLARY: 198 mg/dL — AB (ref 65–99)

## 2016-02-17 MED ORDER — FOLIC ACID 1 MG PO TABS
1.0000 mg | ORAL_TABLET | Freq: Every day | ORAL | Status: DC
  Administered 2016-02-17 – 2016-02-19 (×3): 1 mg via ORAL
  Filled 2016-02-17 (×3): qty 1

## 2016-02-17 MED ORDER — LINACLOTIDE 145 MCG PO CAPS
145.0000 ug | ORAL_CAPSULE | Freq: Every day | ORAL | Status: DC | PRN
  Administered 2016-02-18: 145 ug via ORAL
  Filled 2016-02-17 (×2): qty 1

## 2016-02-17 MED ORDER — CALCIUM CARBONATE ANTACID 500 MG PO CHEW: 1.0000 | CHEWABLE_TABLET | ORAL | Status: DC | PRN

## 2016-02-17 MED ORDER — ENOXAPARIN SODIUM 60 MG/0.6ML ~~LOC~~ SOLN
50.0000 mg | SUBCUTANEOUS | Status: DC
  Administered 2016-02-18 – 2016-02-19 (×2): 50 mg via SUBCUTANEOUS
  Filled 2016-02-17 (×2): qty 0.6

## 2016-02-17 MED ORDER — SPIRONOLACTONE 25 MG PO TABS
100.0000 mg | ORAL_TABLET | Freq: Two times a day (BID) | ORAL | Status: DC
  Administered 2016-02-17: 100 mg via ORAL
  Filled 2016-02-17: qty 4

## 2016-02-17 MED ORDER — ENOXAPARIN SODIUM 40 MG/0.4ML ~~LOC~~ SOLN
40.0000 mg | SUBCUTANEOUS | Status: DC
  Administered 2016-02-17: 40 mg via SUBCUTANEOUS
  Filled 2016-02-17: qty 0.4

## 2016-02-17 MED ORDER — SODIUM CHLORIDE 0.9 % IV SOLN: Freq: Once | INTRAVENOUS | Status: DC

## 2016-02-17 MED ORDER — LORAZEPAM 2 MG/ML IJ SOLN: 1.0000 mg | Freq: Four times a day (QID) | INTRAMUSCULAR | Status: DC | PRN

## 2016-02-17 MED ORDER — ALBUTEROL SULFATE (2.5 MG/3ML) 0.083% IN NEBU
2.5000 mg | INHALATION_SOLUTION | RESPIRATORY_TRACT | Status: DC | PRN
  Filled 2016-02-17: qty 3

## 2016-02-17 MED ORDER — INSULIN ASPART 100 UNIT/ML ~~LOC~~ SOLN
0.0000 [IU] | Freq: Three times a day (TID) | SUBCUTANEOUS | Status: DC
  Administered 2016-02-17: 1 [IU] via SUBCUTANEOUS
  Administered 2016-02-17: 2 [IU] via SUBCUTANEOUS
  Administered 2016-02-17 – 2016-02-18 (×2): 1 [IU] via SUBCUTANEOUS
  Administered 2016-02-18: 2 [IU] via SUBCUTANEOUS
  Administered 2016-02-18 – 2016-02-19 (×2): 1 [IU] via SUBCUTANEOUS

## 2016-02-17 MED ORDER — SODIUM CHLORIDE 0.9 % IV BOLUS (SEPSIS)
500.0000 mL | Freq: Once | INTRAVENOUS | Status: AC
  Administered 2016-02-17: 500 mL via INTRAVENOUS

## 2016-02-17 MED ORDER — POTASSIUM CHLORIDE CRYS ER 20 MEQ PO TBCR
20.0000 meq | EXTENDED_RELEASE_TABLET | ORAL | Status: AC
  Filled 2016-02-17: qty 1

## 2016-02-17 MED ORDER — VITAMIN B-1 100 MG PO TABS
100.0000 mg | ORAL_TABLET | Freq: Every day | ORAL | Status: DC
  Administered 2016-02-17 – 2016-02-19 (×3): 100 mg via ORAL
  Filled 2016-02-17 (×3): qty 1

## 2016-02-17 MED ORDER — LORAZEPAM 1 MG PO TABS: 1.0000 mg | ORAL_TABLET | Freq: Four times a day (QID) | ORAL | Status: DC | PRN

## 2016-02-17 MED ORDER — SODIUM CHLORIDE 0.9 % IV SOLN
INTRAVENOUS | Status: DC
  Administered 2016-02-17: 21:00:00 via INTRAVENOUS

## 2016-02-17 MED ORDER — THIAMINE HCL 100 MG/ML IJ SOLN
100.0000 mg | Freq: Every day | INTRAMUSCULAR | Status: DC
  Filled 2016-02-17 (×2): qty 2

## 2016-02-17 MED ORDER — PANTOPRAZOLE SODIUM 40 MG PO TBEC
40.0000 mg | DELAYED_RELEASE_TABLET | Freq: Two times a day (BID) | ORAL | Status: DC
  Administered 2016-02-17 – 2016-02-19 (×5): 40 mg via ORAL
  Filled 2016-02-17 (×5): qty 1

## 2016-02-17 MED ORDER — WHITE PETROLATUM GEL
Status: DC | PRN
  Administered 2016-02-17: 17:00:00 via TOPICAL
  Filled 2016-02-17 (×2): qty 28.35

## 2016-02-17 MED ORDER — ADULT MULTIVITAMIN W/MINERALS CH
1.0000 | ORAL_TABLET | Freq: Every day | ORAL | Status: DC
Start: 2016-02-17 — End: 2016-02-19
  Administered 2016-02-17 – 2016-02-18 (×2): 1 via ORAL
  Filled 2016-02-17 (×2): qty 1

## 2016-02-17 MED ORDER — ONDANSETRON HCL 4 MG/2ML IJ SOLN: 4.0000 mg | Freq: Four times a day (QID) | INTRAMUSCULAR | Status: DC | PRN

## 2016-02-17 MED ORDER — ONDANSETRON HCL 4 MG PO TABS: 4.0000 mg | ORAL_TABLET | Freq: Four times a day (QID) | ORAL | Status: DC | PRN

## 2016-02-17 MED ORDER — RISAQUAD PO CAPS
1.0000 | ORAL_CAPSULE | Freq: Every day | ORAL | Status: DC
  Administered 2016-02-17 – 2016-02-19 (×3): 1 via ORAL
  Filled 2016-02-17 (×3): qty 1

## 2016-02-17 NOTE — H&P (Signed)
History and Physical    GAIL CREEKMORE ZJI:[id number] DOB: [birth year] DOA: 02/17/2016  Referring MD/NP/PA: Dr. Ralene Bathe PCP: [name], MD  Patient coming from:  home  Chief Complaint:   HPI: [name] is a 75 y.o. male with medical history significant of alcoholic cirrhosis with ascites, alcohol abuse, DM type 2, agent orange exposure, A. fib/atrial flutter, tobacco abuse, and GERD; presents after having a syncopal event at home. Patient receives most of his care at the [facility]. Patient reports that he had gotten up to use the restroom he had sudden onset of lightheadedness and recalls becoming diaphoretic before passing out. Patient notes that he's not been consistently taking his medications and had recently just restarted taking his diuretics approximate 3-4 days ago. He states that he still difficult for him to get on a strict regimen. Associated symptoms include abdominal distention, leg swelling, and nausea. Denies any bloody stools, headache, confusion, chest pain, shortness of breath, or vomiting.  He states that his last paracentesis was approximately 2 years ago and if he can just stay consistent on his medication regimen he doesn't have significant issues with swelling. Patient notes that he still continues to drink 1-2 Geidel a night, but states that he does not have a drinking problem like he used to. Furthermore, he states that he was advised to take his blood pressure medications on an as-needed basis if he happened to see his systolic blood pressures greater than 140.  ED Course: Admission to the emergency department patient was evaluated and seen to vitals signs relatively within normal limits. Lab work revealed hemoglobin 10.1, sodium 124, potassium 3.4, chloride 90, CO2 27, BUN 17, creatinine 1.08, calcium 8.7, glucose 189, and troponin 0.1. EKG showing sinus rhythm with first-degree heart block similar to previous tracings.   Review of Systems: As per HPI otherwise 10 point  review of systems negative.   Past Medical History:  Diagnosis Date  . Adenomatous polyps 06/2004  . Alcoholic cirrhosis of liver with ascites ([place])   . Alcoholism ([place])   . Anxiety    PTSD- no meds  . Atrial fibrillation ([place])   . Atrial flutter ([place])   . Diabetes mellitus without complication ([place])   . Diverticulosis   . GERD (gastroesophageal reflux disease)   . Hemorrhoids   . Hiatal hernia   . Hx of hernia repair   . Hyperlipidemia   . Hypertension   . Pedal edema   . Ulcer ([place])   . Varicose veins     Past Surgical History:  Procedure Laterality Date  . ATRIAL ABLATION SURGERY    . COLONOSCOPY    . POLYPECTOMY    . TONSILLECTOMY    . UMBILICAL HERNIA REPAIR    . VARICOSE VEIN SURGERY     x4     reports that he quit smoking about 5 years ago. His smoking use included Cigarettes. He has never used smokeless tobacco. He reports that he drinks alcohol. He reports that he does not use drugs.  Allergies  Allergen Reactions  . Lisinopril Anaphylaxis    Hyperkalemia, Dizziness   . Atorvastatin     REACTION: Marked leg fatigue  . Sulfonamide Derivatives     Unsure of reaction    Family History  Problem Relation Age of Onset  . Colon polyps Brother   . Prostate cancer Brother   . Heart disease Father     rheumatic fever as child  . Stroke Mother   . Colon cancer  Neg Hx   . Esophageal cancer Neg Hx   . Rectal cancer Neg Hx   . Stomach cancer Neg Hx     Prior to Admission medications   Medication Sig Start Date End Date Taking? Authorizing Provider  calcium carbonate (TUMS EX) 750 MG chewable tablet Chew 1 tablet by mouth as needed for heartburn.   Yes Historical Provider, MD  furosemide (LASIX) 40 MG tablet Take 40 mg by mouth 2 (two) times daily. 10/09/12  Yes Leanna Battles, MD  hydrALAZINE (APRESOLINE) 25 MG tablet Take 25 mg by mouth daily as needed (high blood pressure).  11/01/12  Yes Historical Provider, MD  metoprolol (TOPROL XL) 50 MG 24 hr tablet  Take 50 mg by mouth daily as needed (high blood pressure).    Yes Historical Provider, MD  Multiple Vitamin (MULTIVITAMIN WITH MINERALS) TABS Take 1 tablet by mouth at bedtime.   Yes Historical Provider, MD  omeprazole (PRILOSEC) 20 MG capsule Take 1 capsule (20 mg total) by mouth 2 (two) times daily. Patient taking differently: Take 40 mg by mouth 2 (two) times daily.  03/23/13  Yes Ladene Artist, MD  Probiotic Product (ALIGN) 4 MG CAPS Take 1 capsule by mouth daily.   Yes Historical Provider, MD  spironolactone (ALDACTONE) 100 MG tablet Take 1 tablet (100 mg total) by mouth 2 (two) times daily. 02/16/13  Yes Ladene Artist, MD    Physical Exam:  Constitutional: Elderly male in NAD, calm, comfortable Vitals:   02/17/16 0330 02/17/16 0345 02/17/16 0415 02/17/16 0430  BP: 112/76 129/79 105/80 117/68  Pulse: 73 77 70 73  Resp:  '16 15 22  '$ Temp:      TempSrc:      SpO2: 100% 96% 98% 100%   Eyes: PERRL, lids and conjunctivae normal ENMT: Mucous membranes are dry. Posterior pharynx clear of any exudate or lesions. Normal dentition.  Neck: normal, supple, no masses, no thyromegaly Respiratory: Decreased overall aeration clear to auscultation, no wheezing, no crackles. Normal respiratory effort. No accessory muscle use.  Cardiovascular: Regular rate and rhythm, no murmurs / rubs / gallops. +1 pitting edema of the lower extremity . 2+ pedal pulses. No carotid bruits.  Abdomen: Protuberant abdomen with positive fluid wave, no tenderness, no masses palpated. No hepatosplenomegaly. Bowel sounds positive.  Musculoskeletal: no clubbing / cyanosis. No joint deformity upper and lower extremities. Good ROM, no contractures. Normal muscle tone.  Skin: Varicose veins of the bilateral lower extremities with ulcerations present Neurologic: CN 2-12 grossly intact. Sensation intact, DTR normal. Strength 5/5 in all 4.  Psychiatric: poor judgment and insight. Alert and oriented x 3. Normal mood.     Labs on  Admission: I have personally reviewed following labs and imaging studies  CBC:  Recent Labs Lab 02/17/16 0201  WBC 4.3  HGB 10.1*  HCT 32.6*  MCV 75.8*  PLT 235   Basic Metabolic Panel:  Recent Labs Lab 02/17/16 0216  NA 124*  K 3.4*  CL 90*  CO2 27  GLUCOSE 189*  BUN 17  CREATININE 1.08  CALCIUM 8.7*   GFR: CrCl cannot be calculated (Unknown ideal weight.). Liver Function Tests:  Recent Labs Lab 02/17/16 0216  AST 20  ALT 14*  ALKPHOS 101  BILITOT 1.6*  PROT 6.8  ALBUMIN 3.5   No results for input(s): LIPASE, AMYLASE in the last 168 hours. No results for input(s): AMMONIA in the last 168 hours. Coagulation Profile: No results for input(s): INR, PROTIME in the last 168  hours. Cardiac Enzymes: No results for input(s): CKTOTAL, CKMB, CKMBINDEX, TROPONINI in the last 168 hours. BNP (last 3 results) No results for input(s): PROBNP in the last 8760 hours. HbA1C: No results for input(s): HGBA1C in the last 72 hours. CBG:  Recent Labs Lab 02/17/16 0226  GLUCAP 198*   Lipid Profile: No results for input(s): CHOL, HDL, LDLCALC, TRIG, CHOLHDL, LDLDIRECT in the last 72 hours. Thyroid Function Tests: No results for input(s): TSH, T4TOTAL, FREET4, T3FREE, THYROIDAB in the last 72 hours. Anemia Panel: No results for input(s): VITAMINB12, FOLATE, FERRITIN, TIBC, IRON, RETICCTPCT in the last 72 hours. Urine analysis:    Component Value Date/Time   COLORURINE YELLOW 02/17/2016 0216   APPEARANCEUR CLEAR 02/17/2016 0216   LABSPEC 1.013 02/17/2016 0216   PHURINE 6.0 02/17/2016 0216   GLUCOSEU NEGATIVE 02/17/2016 0216   HGBUR NEGATIVE 02/17/2016 0216   BILIRUBINUR NEGATIVE 02/17/2016 0216   KETONESUR NEGATIVE 02/17/2016 0216   PROTEINUR NEGATIVE 02/17/2016 0216   NITRITE NEGATIVE 02/17/2016 0216   LEUKOCYTESUR NEGATIVE 02/17/2016 0216   Sepsis Labs: No results found for this or any previous visit (from the past 240 hour(s)).   Radiological Exams on  Admission: No results found.  EKG: Independently reviewed. Sinus rhythm with first-degree heart block  Assessment/Plan Syncope/ orthostatic hypotension: Acute. Changes likely secondary to intervascular volume depletion in the setting of diuretics - Admit to a telemetry bed - NS  IV fluids at 75 ml/hr x 1 liter  - Recheck orthostatic vital signs - Trend cardiac enzymes  Alcoholic cirrhosis with ascites: Acute on chronic. Patient with some moderate ascites on physical exam.  - Continue Aldactone - Held Lasix, restart when medically appropriate - Counseled on need of compliance with medication regimen consistently   Hyperglycemia: Initial blood glucose is elevated 189.  - CBGs every before meals with sensitive SSI  Alcohol abuse: Patient still reports drinking 1-2 En[place] on nightly basis. - CWIA protocols    Essential hypertension - Question as needed metoprolol and hydralazine for blood pressure control   Hyponatremia: Sodium initially seen to be 124. -  Recheck BMP at 14:00  Hypokalemia: Potassium noted be 3.4 on admission. - Give 20 mEq potassium chloride 1 dose now  GERD - Pharmacy substitution of Protonix 40 mg twice a day  DVT prophylaxis: Lovenox  Code Status: Full  Family Communication: No family present at bedside  Disposition Plan: Discharged home when medically stable  Consults called: none Admission status: Observation telemetry  Norval Morton MD [facility] Pager [phone]  If 7PM-7AM, please contact night-coverage [email or web address] Password [email or web address]  02/17/2016, 5:48 AM

## 2016-02-17 NOTE — ED Triage Notes (Signed)
Pt BIB EMS from home. Pt states he go up to use the bathroom as he normally does d/t taking lasix. Says he suddenly began to feel very dizzy, laid himself down, and woke up a few minutes later believing he had passed out. Mildly nauseous with some dry heaves for EMS, who gave him '4mg'$  zofran en route.

## 2016-02-17 NOTE — Progress Notes (Signed)
Patient arrived in the unit accompanied by NT via stretcher. Orientation to the unit. Patient verbalizes understanding.

## 2016-02-17 NOTE — ED Notes (Signed)
Pt denied any weakness, dizziness, lightheadedness while standing, but pt noted to be unsteady and unbalanced.

## 2016-02-17 NOTE — Progress Notes (Signed)
PROGRESS NOTE    [name]  YME:[id number] DOB: 1940/10/18 DOA: 02/17/2016 PCP: [name], MD   Brief Narrative: This is 75 year old male with History for cirrhosis due to alcohol. He has been noncompliant with his medical therapy. 3-4 days ago he resume his diuretics. On the day of admission he developed a syncope episode at home, apparently he got up from his bed to use the bathroom, he developed lightheadedness and diaphoresis before loosening is consciousnes. On his initial physical examination he was found dehydrated, blood pressure systolic 680. His abdomen was protuberant. Patient was admitted to the hospital working diagnosis of syncope due to orthostatic hypotension related to diuretic use.  Assessment & Plan:   Active Problems:   Cirrhosis, alcoholic (HCC)   Orthostatic hypotension   Syncope   Hyponatremia   Hypokalemia   1. Orthostatic hypotension. Will continue gentle hydration with saline at 75 cc/hr, no signs of volume overload, will check orthostatics in am. Will continue to hold on diuretics and antihypertensive agents.  2. Cirrhosis. Due to etho use, no clinical signs of significant ascites. Will continue neuro checks.    3. Hyperglycemia. Patient tolerating po well, will continue glucose monitoring with insulin sliding scale.  4, Etho abuse. No signs of withdrawal, will continue neuro checks per unit protocol. Continue benzodiazepines per protocol.   5. HTN. Continue to hold on antihypertensive agents.  6. Hyponatremia and hypokalemia. Will continue to follow on electrolytes in am, will continue gentle hydration with isotonic saline at 75 cc/hr.     DVT prophylaxis: enoxaparin Code Status: full  Family Communication: no family bedside Disposition Plan: home   Consultants:  na  Procedures:   Antimicrobials:    Subjective: Patient feeling better, no nausea or vomiting, tolerating po well. No abdominal pain. No confusion.   Objective: Vitals:   02/17/16 0430 02/17/16 0600 02/17/16 0700 02/17/16 1400  BP: 117/68 126/67 110/63   Pulse: 73 (!) 58 63   Resp: 22 22 (!) 22   Temp:   97.5 F (36.4 C) 97.7 F (36.5 C)  TempSrc:   Oral Oral  SpO2: 100% 97% 96% 100%  Weight:   100 kg (220 lb 6.4 oz)   Height:   '5\' 7"'$  (1.702 m)     Intake/Output Summary (Last 24 hours) at 02/17/16 1615 Last data filed at 02/17/16 1553  Gross per 24 hour  Intake              510 ml  Output              490 ml  Net               20 ml   Filed Weights   02/17/16 0700  Weight: 100 kg (220 lb 6.4 oz)    Examination:  General exam: deconditioned E ENT: no conjunctival pallor, oral mucosa moist Respiratory system: Clear to auscultation. Respiratory effort normal. Mild decreased breath sounds at bases.   Cardiovascular system: S1 & S2 heard, RRR. No JVD, murmurs, rubs, gallops or clicks. Positive + pedal edema, non pitting. Gastrointestinal system: Abdomen is nondistended, soft and nontender. No organomegaly or masses felt. Normal bowel sounds heard. Central nervous system: Alert and oriented. No focal neurological deficits. Extremities: Symmetric 5 x 5 power. Skin: No rashes, lesions or ulcers  Data Reviewed: I have personally reviewed following labs and imaging studies  CBC:  Recent Labs Lab 02/17/16 0201  WBC 4.3  HGB 10.1*  HCT 32.6*  MCV 75.8*  PLT 102   Basic Metabolic Panel:  Recent Labs Lab 02/17/16 0216 02/17/16 1250  NA 124* 124*  K 3.4* 3.7  CL 90* 92*  CO2 27 26  GLUCOSE 189* 161*  BUN 17 16  CREATININE 1.08 1.07  CALCIUM 8.7* 8.4*   GFR: Estimated Creatinine Clearance: 70.3 mL/min (by C-G formula based on SCr of 1.07 mg/dL). Liver Function Tests:  Recent Labs Lab 02/17/16 0216  AST 20  ALT 14*  ALKPHOS 101  BILITOT 1.6*  PROT 6.8  ALBUMIN 3.5   No results for input(s): LIPASE, AMYLASE in the last 168 hours. No results for input(s): AMMONIA in the last 168 hours. Coagulation Profile: No results for  input(s): INR, PROTIME in the last 168 hours. Cardiac Enzymes:  Recent Labs Lab 02/17/16 1122  TROPONINI <0.03   BNP (last 3 results) No results for input(s): PROBNP in the last 8760 hours. HbA1C: No results for input(s): HGBA1C in the last 72 hours. CBG:  Recent Labs Lab 02/17/16 0226 02/17/16 0744 02/17/16 1130  GLUCAP 198* 150* 169*   Lipid Profile: No results for input(s): CHOL, HDL, LDLCALC, TRIG, CHOLHDL, LDLDIRECT in the last 72 hours. Thyroid Function Tests: No results for input(s): TSH, T4TOTAL, FREET4, T3FREE, THYROIDAB in the last 72 hours. Anemia Panel: No results for input(s): VITAMINB12, FOLATE, FERRITIN, TIBC, IRON, RETICCTPCT in the last 72 hours. Sepsis Labs: No results for input(s): PROCALCITON, LATICACIDVEN in the last 168 hours.  No results found for this or any previous visit (from the past 240 hour(s)).       Radiology Studies: Dg Chest 2 View  Result Date: 02/17/2016 CLINICAL DATA:  Dizziness and syncope.  Nausea. EXAM: CHEST  2 VIEW COMPARISON:  10/07/2012 FINDINGS: Mild cardiac enlargement. Mild central pulmonary vascular congestion. Small bilateral pleural effusions with basilar atelectasis. No edema or consolidation. No pneumothorax. Calcified and tortuous aorta IMPRESSION: Cardiac enlargement with pulmonary vascular congestion and bilateral pleural effusions. Electronically Signed   By: Lucienne Capers M.D.   On: 02/17/2016 06:22        Scheduled Meds: . acidophilus  1 capsule Oral Daily  . [START ON 02/18/2016] enoxaparin (LOVENOX) injection  50 mg Subcutaneous Q24H  . folic acid  1 mg Oral Daily  . insulin aspart  0-9 Units Subcutaneous TID WC  . multivitamin with minerals  1 tablet Oral QHS  . pantoprazole  40 mg Oral BID  . potassium chloride  20 mEq Oral STAT  . spironolactone  100 mg Oral BID  . thiamine  100 mg Oral Daily   Or  . thiamine  100 mg Intravenous Daily   Continuous Infusions: . sodium chloride 75 mL/hr at  02/17/16 1234     LOS: 0 days       Trace Cederberg Gerome Apley, MD [facility] Pager [phone]  If 7PM-7AM, please contact night-coverage [email or web address] Password [email or web address] 02/17/2016, 4:15 PM

## 2016-02-17 NOTE — ED Provider Notes (Signed)
[facility] DEPT Provider Note   CSN: [id number] Arrival date & time: 02/17/16  0143  By signing my name below, I, [name], attest that this documentation has been prepared under the direction and in the presence of Kurt Reichert, MD . Electronically Signed: Jeanell Ball, Scribe. 02/17/2016. 2:00 AM.  History   Chief Complaint Chief Complaint  Patient presents with  . Loss of Consciousness   The history is provided by the patient and medical records. No language interpreter was used.   HPI Comments: [name] is a 75 y.o. male with a hx of dizzinessbrought in by ambulance, who presents to the Emergency Department complaining of a syncopal episode that occurred today. He states he went to the bathroom when he had a sudden onset of dizziness and diaphoresis before passing out. He woke up a few minutes later. He reports has not been taking his lasix lately. Per nurse's note, pt was given '4mg'$  zofran by EMS en route. He is gradually improving and currently only nauseous. Denies any heavy cigarette/alcohol use, headache, chest pain, SOB, abdominal pain, black/bloody stools, .    PCP: [name], MD  Past Medical History:  Diagnosis Date  . Adenomatous polyps 06/2004  . Alcoholic cirrhosis of liver with ascites ([place])   . Alcoholism ([place])   . Anxiety    PTSD- no meds  . Atrial fibrillation ([place])   . Atrial flutter ([place])   . Diabetes mellitus without complication ([place])   . Diverticulosis   . GERD (gastroesophageal reflux disease)   . Hemorrhoids   . Hiatal hernia   . Hx of hernia repair   . Hyperlipidemia   . Hypertension   . Pedal edema   . Ulcer   . Varicose veins     Patient Active Problem List   Diagnosis Date Noted  . Varicose veins of bilateral lower extremities with other complications 40/98/1191  . Other pancytopenia ([place]) 10/09/2012    Class: Acute  . Acute renal failure ([place]) 10/07/2012  . Hyperkalemia 10/07/2012  . Orthostatic Dizziness 10/07/2012  .  Cirrhosis, alcoholic ([place]) 47/82/9562  . Ascites 10/07/2012  . Varicose veins of lower extremities with other complications 13/12/6576  . HYPERLIPIDEMIA 11/01/2008  . ALCOHOLISM 11/01/2008  . HYPERTENSION 11/01/2008  . ATRIAL FIBRILLATION 11/01/2008  . ATRIAL FLUTTER 11/01/2008  . HEMORRHOIDS 11/01/2008  . ISCHEMIA 11/01/2008  . HERNIA, UMBILICAL 46/96/2952  . DIVERTICULOSIS OF COLON 11/01/2008  . PEDAL EDEMA 11/01/2008  . PALPITATIONS, RECURRENT 11/01/2008  . CHEST PAIN 11/01/2008  . CHEST PAIN, PLEURITIC 11/01/2008  . GERD 12/16/2007  . ABDOMINAL PAIN, EPIGASTRIC 12/16/2007  . COLONIC POLYPS, ADENOMATOUS, HX OF 12/16/2007    Past Surgical History:  Procedure Laterality Date  . ATRIAL ABLATION SURGERY    . COLONOSCOPY    . POLYPECTOMY    . TONSILLECTOMY    . UMBILICAL HERNIA REPAIR    . VARICOSE VEIN SURGERY     x4       Home Medications    Prior to Admission medications   Medication Sig Start Date End Date Taking? Authorizing Provider  aspirin EC 81 MG tablet Take 81 mg by mouth daily.    Historical Provider, MD  calcium carbonate (TUMS EX) 750 MG chewable tablet Chew 1 tablet by mouth as needed for heartburn.    Historical Provider, MD  furosemide (LASIX) 40 MG tablet Take 40 mg by mouth 2 (two) times daily. 10/09/12   Leanna Battles, MD  hydrALAZINE (APRESOLINE) 25 MG tablet Take 25  mg by mouth as needed.  11/01/12   Historical Provider, MD  metoprolol (TOPROL XL) 50 MG 24 hr tablet Take 25 mg by mouth at bedtime.     Historical Provider, MD  Multiple Vitamin (MULTIVITAMIN WITH MINERALS) TABS Take 1 tablet by mouth at bedtime.    Historical Provider, MD  Omega-3 Fatty Acids (FISH OIL) 1000 MG CAPS Take 1 capsule by mouth at bedtime.    Historical Provider, MD  omeprazole (PRILOSEC) 20 MG capsule Take 1 capsule (20 mg total) by mouth 2 (two) times daily. 03/23/13   Ladene Artist, MD  Probiotic Product (ALIGN) 4 MG CAPS Take 1 capsule by mouth daily.    Historical  Provider, MD  spironolactone (ALDACTONE) 100 MG tablet Take 1 tablet (100 mg total) by mouth 2 (two) times daily. 02/16/13   Ladene Artist, MD    Family History Family History  Problem Relation Age of Onset  . Colon polyps Brother   . Prostate cancer Brother   . Heart disease Father     rheumatic fever as child  . Stroke Mother   . Colon cancer Neg Hx   . Esophageal cancer Neg Hx   . Rectal cancer Neg Hx   . Stomach cancer Neg Hx     Social History Social History  Substance Use Topics  . Smoking status: Former Smoker    Types: Cigarettes    Quit date: 01/16/2011  . Smokeless tobacco: Never Used  . Alcohol use 0.0 oz/week    12 - 15 Cans of beer per week     Comment: 4-5 [place] daily     Allergies   Lisinopril; Atorvastatin; and Sulfonamide derivatives   Review of Systems Review of Systems 10 Systems reviewed and all are negative for acute change except as noted in the HPI.  Physical Exam Updated Vital Signs BP 121/64 (BP Location: Left Arm)   Pulse 78   Temp 97.6 F (36.4 C) (Oral)   Resp 16   SpO2 100%   Physical Exam  Constitutional: He is oriented to person, place, and time. He appears well-developed and well-nourished.  HENT:  Head: Normocephalic and atraumatic.  Cardiovascular: Normal rate and regular rhythm.   No murmur heard. Pulmonary/Chest: Effort normal and breath sounds normal. No respiratory distress.  Abdominal: Soft. There is no tenderness. There is no rebound and no guarding.  Musculoskeletal: He exhibits edema. He exhibits no tenderness.  Non-pitting edema in BLE.   Neurological: He is alert and oriented to person, place, and time.  Skin: Skin is warm and dry.  Psychiatric: He has a normal mood and affect. His behavior is normal.  Nursing note and vitals reviewed.    ED Treatments / Results  DIAGNOSTIC STUDIES: Oxygen Saturation is 100% on RA, normal by my interpretation.    COORDINATION OF CARE: 2:05 AM- Pt advised of plan for  treatment and pt agrees.  Labs (all labs ordered are listed, but only abnormal results are displayed) Labs Reviewed  CBC  URINALYSIS, ROUTINE W REFLEX MICROSCOPIC (NOT AT [facility])  COMPREHENSIVE METABOLIC PANEL  CBG MONITORING, ED  I-STAT TROPOININ, ED    EKG  EKG Interpretation None       Radiology No results found.  Procedures Procedures (including critical care time)  Medications Ordered in ED Medications - No data to display   Initial Impression / Assessment and Plan / ED Course  I have reviewed the triage vital signs and the nursing notes.  Pertinent labs & imaging  results that were available during my care of the patient were reviewed by me and considered in my medical decision making (see chart for details).  Clinical Course    Pt with hx/o alcohol abuse, cirrhosis, hyponatremia here following syncopal episode at home.  He is intermittently compliant with his lasix and endorses taking it over the last week more consistently than usual.  He is orthostatic in the ED with persistent nausea/malaise and worsening hyponatremia.  Nausea persists despite IVF hydration.  Plan to admit for observation given orthostasis, hyponatremia.   Final Clinical Impressions(s) / ED Diagnoses   Final diagnoses:  Hyponatremia  Orthostatic hypotension  Syncope and collapse    New Prescriptions New Prescriptions   No medications on file   I personally performed the services described in this documentation, which was scribed in my presence. The recorded information has been reviewed and is accurate.     Kurt Reichert, MD 02/17/16 [phone]

## 2016-02-17 NOTE — ED Notes (Signed)
Attempted to call report x 1  

## 2016-02-18 DIAGNOSIS — K7030 Alcoholic cirrhosis of liver without ascites: Secondary | ICD-10-CM

## 2016-02-18 DIAGNOSIS — E871 Hypo-osmolality and hyponatremia: Secondary | ICD-10-CM | POA: Diagnosis not present

## 2016-02-18 DIAGNOSIS — I10 Essential (primary) hypertension: Secondary | ICD-10-CM | POA: Diagnosis not present

## 2016-02-18 DIAGNOSIS — D509 Iron deficiency anemia, unspecified: Secondary | ICD-10-CM | POA: Diagnosis present

## 2016-02-18 DIAGNOSIS — R55 Syncope and collapse: Secondary | ICD-10-CM | POA: Diagnosis not present

## 2016-02-18 DIAGNOSIS — K219 Gastro-esophageal reflux disease without esophagitis: Secondary | ICD-10-CM

## 2016-02-18 LAB — GLUCOSE, CAPILLARY
GLUCOSE-CAPILLARY: 145 mg/dL — AB (ref 65–99)
GLUCOSE-CAPILLARY: 160 mg/dL — AB (ref 65–99)
GLUCOSE-CAPILLARY: 163 mg/dL — AB (ref 65–99)
Glucose-Capillary: 144 mg/dL — ABNORMAL HIGH (ref 65–99)

## 2016-02-18 LAB — BASIC METABOLIC PANEL
Anion gap: 9 (ref 5–15)
BUN: 16 mg/dL (ref 6–20)
CHLORIDE: 94 mmol/L — AB (ref 101–111)
CO2: 24 mmol/L (ref 22–32)
CREATININE: 0.98 mg/dL (ref 0.61–1.24)
Calcium: 8.7 mg/dL — ABNORMAL LOW (ref 8.9–10.3)
GFR calc Af Amer: >60 mL/min (ref >60)
GFR calc non Af Amer: >60 mL/min (ref >60)
GLUCOSE: 132 mg/dL — AB (ref 65–99)
POTASSIUM: 4.2 mmol/L (ref 3.5–5.1)
SODIUM: 127 mmol/L — AB (ref 135–145)

## 2016-02-18 LAB — CBC
HEMATOCRIT: 30.8 % — AB (ref 39.0–52.0)
HEMOGLOBIN: 9.5 g/dL — AB (ref 13.0–17.0)
MCH: 23.8 pg — AB (ref 26.0–34.0)
MCHC: 30.8 g/dL (ref 30.0–36.0)
MCV: 77.2 fL — AB (ref 78.0–100.0)
Platelets: 163 10*3/uL (ref 150–400)
RBC: 3.99 MIL/uL — AB (ref 4.22–5.81)
RDW: 17.6 % — ABNORMAL HIGH (ref 11.5–15.5)
WBC: 4.5 10*3/uL (ref 4.0–10.5)

## 2016-02-18 LAB — IRON AND TIBC
IRON: 25 ug/dL — AB (ref 45–182)
SATURATION RATIOS: 5 % — AB (ref 17.9–39.5)
TIBC: 469 ug/dL — AB (ref 250–450)
UIBC: 444 ug/dL

## 2016-02-18 LAB — FERRITIN: FERRITIN: 13 ng/mL — AB (ref 24–336)

## 2016-02-18 LAB — OCCULT BLOOD X 1 CARD TO LAB, STOOL: Fecal Occult Bld: POSITIVE — AB

## 2016-02-18 MED ORDER — FUROSEMIDE 40 MG PO TABS
40.0000 mg | ORAL_TABLET | Freq: Two times a day (BID) | ORAL | Status: DC
  Administered 2016-02-18 – 2016-02-19 (×2): 40 mg via ORAL
  Filled 2016-02-18 (×2): qty 1

## 2016-02-18 MED ORDER — SPIRONOLACTONE 25 MG PO TABS
50.0000 mg | ORAL_TABLET | Freq: Two times a day (BID) | ORAL | Status: DC
  Administered 2016-02-18: 50 mg via ORAL
  Filled 2016-02-18: qty 2

## 2016-02-18 MED ORDER — SPIRONOLACTONE 25 MG PO TABS
100.0000 mg | ORAL_TABLET | Freq: Two times a day (BID) | ORAL | Status: DC
  Administered 2016-02-18 – 2016-02-19 (×2): 100 mg via ORAL
  Filled 2016-02-18 (×2): qty 4

## 2016-02-18 MED ORDER — FUROSEMIDE 20 MG PO TABS
20.0000 mg | ORAL_TABLET | Freq: Two times a day (BID) | ORAL | Status: DC
  Administered 2016-02-18: 20 mg via ORAL
  Filled 2016-02-18: qty 1

## 2016-02-18 NOTE — Care Management Obs Status (Signed)
[facility] NOTIFICATION   Patient Details  Name: [name] MRN: [id number] Date of Birth: 03-19-1941   Medicare Observation Status Notification Given:  Yes    Royston Bake, RN 02/18/2016, 2:20 PM

## 2016-02-18 NOTE — Progress Notes (Signed)
Called by RN for breathing treatment. Patient stated he didn't want treatment and that he knows his body and he needs more lasix. Patient SOB and says he feels like he is about to pop from fluid. Bilateral breath sounds clear and diminished. RN notified.

## 2016-02-18 NOTE — Progress Notes (Addendum)
PROGRESS NOTE    [name]  HAL:[id number] DOB: May 08, 1941 DOA: 02/17/2016 PCP: [name], MD      Brief Narrative:  [name] is a 75 y.o. male with medical history significant of alcoholic cirrhosis with ascites, alcohol abuse, DM type 2, agent orange exposure, A. fib/atrial flutter, tobacco abuse, and GERD; presents after having a syncopal event at home. Patient receives most of his care at the [facility]. Patient reports that he had gotten up to use the restroom he had sudden onset of lightheadedness and recalls becoming diaphoretic before passing out. Patient notes that he's not been consistently taking his medications and had recently just restarted taking his diuretics approximate 3-4 days ago. He was admitted for syncope and orthostatic hypotension.   Assessment & Plan:   Principal Problem:   Syncope Active Problems:   Essential hypertension   Cirrhosis, alcoholic (HCC)   Orthostatic hypotension   Hyponatremia   Microcytic anemia   GERD (gastroesophageal reflux disease)   Syncope/ orthostatic hypotension - Likely secondary to intervascular volume depletion in the setting of diuretics - Orthostatic VS stable. Repeat daily AM  - Trop x 3 negative  - Tele  Addendum: notified by RN that patient complaining of worsening fluid build up. VSS. Will increase tonight's diuretic to home dose.  - Stop IVF   Alcoholic cirrhosis with ascites - Lasix, spironolactone  DM type II   - CBGs every before meals with sensitive SSI - Check ha1c   Alcohol abuse: Patient still reports drinking 1-2 [place]st[place] on nightly basis - CIWA protocols   Chronic microcytic anemia - Baseline Hgb 11-12 - No evidence of acute bleed - Check iron studies  - Check hemoccult  - Continue folic acid  Essential hypertension - Holding hydralazine, toprol   Hyponatremia - Improving  GERD - Protonix 40 mg twice a day  DVT prophylaxis: Lovenox  Code Status: Full  Family Communication: No  family present at bedside  Disposition Plan: Will stop IVF, start diuretics today. If stable, home tomorrow.    Consultants:   None  Procedures:   None  Antimicrobials:   None    Subjective: Patient feeling well this morning. Denies any chest pain, SOB, dizziness, lightheadedness. He does admit that he feels more bloating and fullness in his stomach as he has now been off diuretics for a number of days.   Objective: Vitals:   02/17/16 1400 02/17/16 2013 02/18/16 0000 02/18/16 0451  BP:  122/67 120/73 (!) 133/59  Pulse:  72 75 70  Resp:  '18 18 19  '$ Temp: 97.7 F (36.5 C) 98.3 F (36.8 C) 97.7 F (36.5 C) 97.8 F (36.6 C)  TempSrc: Oral Oral Oral Oral  SpO2: 100% 100% 100% 99%  Weight:    101.6 kg (224 lb)  Height:        Intake/Output Summary (Last 24 hours) at 02/18/16 0847 Last data filed at 02/18/16 0700  Gross per 24 hour  Intake          1929.58 ml  Output             1690 ml  Net           239.58 ml   Filed Weights   02/17/16 0700 02/18/16 0451  Weight: 100 kg (220 lb 6.4 oz) 101.6 kg (224 lb)    Examination:  General exam: Appears calm and comfortable  Respiratory system: Clear to auscultation. Respiratory effort normal. Cardiovascular system: S1 & S2 heard, RRR. No  JVD, murmurs, rubs, gallops or clicks. +trace pedal edema. Gastrointestinal system: Abdomen is +distended, soft and nontender. No organomegaly or masses felt. Normal bowel sounds heard. Central nervous system: Alert and oriented. No focal neurological deficits. Extremities: Symmetric 5 x 5 power. Skin: No rashes, lesions or ulcers Psychiatry: Judgement and insight appear normal. Mood & affect appropriate.   Data Reviewed: I have personally reviewed following labs and imaging studies  CBC:  Recent Labs Lab 02/17/16 0201 02/18/16 0411  WBC 4.3 4.5  HGB 10.1* 9.5*  HCT 32.6* 30.8*  MCV 75.8* 77.2*  PLT 177 656   Basic Metabolic Panel:  Recent Labs Lab 02/17/16 0216  02/17/16 1250 02/18/16 0411  NA 124* 124* 127*  K 3.4* 3.7 4.2  CL 90* 92* 94*  CO2 '27 26 24  '$ GLUCOSE 189* 161* 132*  BUN '17 16 16  '$ CREATININE 1.08 1.07 0.98  CALCIUM 8.7* 8.4* 8.7*   GFR: Estimated Creatinine Clearance: 77.4 mL/min (by C-G formula based on SCr of 0.98 mg/dL). Liver Function Tests:  Recent Labs Lab 02/17/16 0216  AST 20  ALT 14*  ALKPHOS 101  BILITOT 1.6*  PROT 6.8  ALBUMIN 3.5   No results for input(s): LIPASE, AMYLASE in the last 168 hours. No results for input(s): AMMONIA in the last 168 hours. Coagulation Profile: No results for input(s): INR, PROTIME in the last 168 hours. Cardiac Enzymes:  Recent Labs Lab 02/17/16 1122 02/17/16 1827  TROPONINI <0.03 <0.03   BNP (last 3 results) No results for input(s): PROBNP in the last 8760 hours. HbA1C: No results for input(s): HGBA1C in the last 72 hours. CBG:  Recent Labs Lab 02/17/16 0744 02/17/16 1130 02/17/16 1637 02/17/16 2154 02/18/16 0546  GLUCAP 150* 169* 145* 142* 145*   Lipid Profile: No results for input(s): CHOL, HDL, LDLCALC, TRIG, CHOLHDL, LDLDIRECT in the last 72 hours. Thyroid Function Tests: No results for input(s): TSH, T4TOTAL, FREET4, T3FREE, THYROIDAB in the last 72 hours. Anemia Panel: No results for input(s): VITAMINB12, FOLATE, FERRITIN, TIBC, IRON, RETICCTPCT in the last 72 hours. Sepsis Labs: No results for input(s): PROCALCITON, LATICACIDVEN in the last 168 hours.  No results found for this or any previous visit (from the past 240 hour(s)).       Radiology Studies: Dg Chest 2 View  Result Date: 02/17/2016 CLINICAL DATA:  Dizziness and syncope.  Nausea. EXAM: CHEST  2 VIEW COMPARISON:  10/07/2012 FINDINGS: Mild cardiac enlargement. Mild central pulmonary vascular congestion. Small bilateral pleural effusions with basilar atelectasis. No edema or consolidation. No pneumothorax. Calcified and tortuous aorta IMPRESSION: Cardiac enlargement with pulmonary vascular  congestion and bilateral pleural effusions. Electronically Signed   By: Lucienne Capers M.D.   On: 02/17/2016 06:22        Scheduled Meds: . acidophilus  1 capsule Oral Daily  . enoxaparin (LOVENOX) injection  50 mg Subcutaneous Q24H  . folic acid  1 mg Oral Daily  . furosemide  20 mg Oral BID  . insulin aspart  0-9 Units Subcutaneous TID WC  . multivitamin with minerals  1 tablet Oral QHS  . pantoprazole  40 mg Oral BID  . spironolactone  50 mg Oral BID  . thiamine  100 mg Oral Daily   Or  . thiamine  100 mg Intravenous Daily   Continuous Infusions:     LOS: 0 days    Time spent: 67mnutes    JDessa Phi DO [facility] Pager [phone] If 7PM-7AM, please contact night-coverage [email or web address] Password [email or web address]10/06/2015,  8:47 AM

## 2016-02-18 NOTE — Progress Notes (Signed)
ABN given to patient as requested. Mindi [facility] [phone]

## 2016-02-18 NOTE — Progress Notes (Signed)
MD, pt wanted to take his Linzess 151mg which he takes at home, thanks MBuckner [place]

## 2016-02-19 DIAGNOSIS — I10 Essential (primary) hypertension: Secondary | ICD-10-CM | POA: Diagnosis not present

## 2016-02-19 DIAGNOSIS — E871 Hypo-osmolality and hyponatremia: Secondary | ICD-10-CM | POA: Diagnosis not present

## 2016-02-19 DIAGNOSIS — R55 Syncope and collapse: Secondary | ICD-10-CM | POA: Diagnosis not present

## 2016-02-19 DIAGNOSIS — D509 Iron deficiency anemia, unspecified: Secondary | ICD-10-CM | POA: Diagnosis present

## 2016-02-19 DIAGNOSIS — K7030 Alcoholic cirrhosis of liver without ascites: Secondary | ICD-10-CM | POA: Diagnosis not present

## 2016-02-19 LAB — CBC WITH DIFFERENTIAL/PLATELET
BASOS ABS: 0 10*3/uL (ref 0.0–0.1)
Basophils Relative: 1 %
EOS ABS: 0.1 10*3/uL (ref 0.0–0.7)
EOS PCT: 2 %
HCT: 32.8 % — ABNORMAL LOW (ref 39.0–52.0)
Hemoglobin: 9.9 g/dL — ABNORMAL LOW (ref 13.0–17.0)
LYMPHS PCT: 12 %
Lymphs Abs: 0.5 10*3/uL — ABNORMAL LOW (ref 0.7–4.0)
MCH: 23.3 pg — AB (ref 26.0–34.0)
MCHC: 30.2 g/dL (ref 30.0–36.0)
MCV: 77.4 fL — AB (ref 78.0–100.0)
MONO ABS: 0.7 10*3/uL (ref 0.1–1.0)
Monocytes Relative: 16 %
Neutro Abs: 3 10*3/uL (ref 1.7–7.7)
Neutrophils Relative %: 71 %
PLATELETS: 168 10*3/uL (ref 150–400)
RBC: 4.24 MIL/uL (ref 4.22–5.81)
RDW: 17.8 % — AB (ref 11.5–15.5)
WBC: 4.3 10*3/uL (ref 4.0–10.5)

## 2016-02-19 LAB — BASIC METABOLIC PANEL
ANION GAP: 8 (ref 5–15)
BUN: 15 mg/dL (ref 6–20)
CALCIUM: 9.1 mg/dL (ref 8.9–10.3)
CO2: 26 mmol/L (ref 22–32)
Chloride: 91 mmol/L — ABNORMAL LOW (ref 101–111)
Creatinine, Ser: 1 mg/dL (ref 0.61–1.24)
GFR calc Af Amer: >60 mL/min (ref >60)
GLUCOSE: 152 mg/dL — AB (ref 65–99)
POTASSIUM: 4.2 mmol/L (ref 3.5–5.1)
SODIUM: 125 mmol/L — AB (ref 135–145)

## 2016-02-19 LAB — HEMOGLOBIN A1C
Hgb A1c MFr Bld: 7.1 % — ABNORMAL HIGH (ref 4.8–5.6)
MEAN PLASMA GLUCOSE: 157 mg/dL

## 2016-02-19 LAB — GLUCOSE, CAPILLARY: GLUCOSE-CAPILLARY: 127 mg/dL — AB (ref 65–99)

## 2016-02-19 MED ORDER — FERROUS GLUCONATE 324 (38 FE) MG PO TABS: 324.0000 mg | ORAL_TABLET | Freq: Three times a day (TID) | ORAL | 0 refills | Status: DC

## 2016-02-19 NOTE — Discharge Summary (Signed)
Physician Discharge Summary  [name] OQH:[id number] DOB: 31-Jan-1941 DOA: 02/17/2016  PCP: [name], MD  Admit date: 02/17/2016 Discharge date: 02/19/2016  Admitted From: Home Disposition:  Home  Recommendations for Outpatient Follow-up:  1. Follow up with PCP in 1-2 weeks 2. Please obtain BMP/CBC in one week  3. You need to follow up on your hemoglobin, possible blood loss anemia, and iron levels, and possibly EGD/colonoscopy   Home Health: No  Equipment/Devices: None   Discharge Condition: Stable CODE STATUS: Full  Diet recommendation: heart healthy   Brief/Interim Summary: [name] a 75 y.o.malewith medical history significant of alcoholic cirrhosis withascites, alcohol abuse, DM type 2, agent orange exposure, A. fib/atrial flutter, tobacco abuse, and GERD; presents after having a syncopal event at home. Patient receives most of his care at the [facility]. Patient reports that he had gotten up to use the restroom he had sudden onset of lightheadedness and recalls becoming diaphoretic before passing out. Patient notes that he's not been consistently taking his medications and had recently just restarted taking his diuretics approximate 3-4 days ago; he believes he had taken some medications overlapping one another to "make up" for missing doses. He was admitted for syncope and orthostatic hypotension. During hospitalization, orthostatic vital signs were checked, which have remained stable. His syncope and orthostatic hypotension was felt to be likely secondary to intravascular volume depletion in setting of diuretics as well as antihypertensive medications. Prior to discharge, his diuretics were restarted at home doses. Patient tolerated this well. Due to his microcytic anemia, iron levels were checked which were low. Patient is sent home with iron supplements. It is possible that his syncope is also due to anemia, although he denied any occult blood loss at home. This needs to  be followed up outpatient with possible EGD and colonoscopy. Hemoglobin levels have remained stable. On day of discharge, patient was tolerating diet and had no complaints. He denied any dizziness or lightheadedness. No further episodes of syncope during hospitalization.  Discharge Diagnoses:  Principal Problem:   Syncope Active Problems:   Essential hypertension   Cirrhosis, alcoholic (HCC)   Orthostatic hypotension   Hyponatremia   Microcytic anemia   GERD (gastroesophageal reflux disease)   Iron deficiency anemia  Syncope/ orthostatic hypotension - Likely secondary to intervascular volume depletion in the setting of diuretics, possibly due to anemia - Orthostatic VS stable - Trop x 3 negative  - Tele - Tx with IVF, stopped and restarted home diuretics without recurrence of syncope/hypotension   Alcoholic cirrhosis with ascites - Lasix, spironolactone  DM type II   - CBGs every before meals with sensitive SSI - Check ha1c   Alcohol abuse: Patient still reports drinking 1-2 Shankar on nightly basis - CIWA protocols   Chronic microcytic anemia - Baseline Hgb 11-12 - No evidence of acute bleed - Iron levels low; start supplementation - Needs close follow up and possible GI evaluation for repeat colonoscopy. Patient states he had one a few years ago with non-cancerous polyps   Essential hypertension - Holding hydralazine, toprol. Takes PRN at home for SBP > 140   Hyponatremia - Improving  GERD - Protonix 40 mg twice a day   Discharge Instructions  Discharge Instructions    Call MD for:  persistant dizziness or light-headedness    Complete by:  As directed    Diet - low sodium heart healthy    Complete by:  As directed    Increase activity slowly  Complete by:  As directed        Medication List    TAKE these medications   ALIGN 4 MG Caps Take 1 capsule by mouth daily.   calcium carbonate 750 MG chewable tablet Commonly known as:  TUMS EX Chew  1 tablet by mouth as needed for heartburn.   ferrous gluconate 324 MG tablet Commonly known as:  FERGON Take 1 tablet (324 mg total) by mouth 3 (three) times daily with meals.   furosemide 40 MG tablet Commonly known as:  LASIX Take 40 mg by mouth 2 (two) times daily.   hydrALAZINE 25 MG tablet Commonly known as:  APRESOLINE Take 25 mg by mouth daily as needed (high blood pressure).   LINZESS 145 MCG Caps capsule Generic drug:  linaclotide Take 145 mcg by mouth daily as needed (constipation).   multivitamin with minerals Tabs tablet Take 1 tablet by mouth at bedtime.   omeprazole 20 MG capsule Commonly known as:  PRILOSEC Take 1 capsule (20 mg total) by mouth 2 (two) times daily. What changed:  how much to take   spironolactone 100 MG tablet Commonly known as:  ALDACTONE Take 1 tablet (100 mg total) by mouth 2 (two) times daily.   TOPROL XL 50 MG 24 hr tablet Generic drug:  metoprolol succinate Take 50 mg by mouth daily as needed (high blood pressure).      Follow-up Information    [name], MD. Schedule an appointment as soon as possible for a visit in 1 week(s).   Specialty:  Internal Medicine Contact information: [place] [id number] [phone]          Allergies  Allergen Reactions  . Lisinopril Anaphylaxis    Hyperkalemia, Dizziness   . Atorvastatin     REACTION: Marked leg fatigue  . Sulfonamide Derivatives     Unsure of reaction    Consultations:  None   Procedures/Studies: Dg Chest 2 View  Result Date: 02/17/2016 CLINICAL DATA:  Dizziness and syncope.  Nausea. EXAM: CHEST  2 VIEW COMPARISON:  10/07/2012 FINDINGS: Mild cardiac enlargement. Mild central pulmonary vascular congestion. Small bilateral pleural effusions with basilar atelectasis. No edema or consolidation. No pneumothorax. Calcified and tortuous aorta IMPRESSION: Cardiac enlargement with pulmonary vascular congestion and bilateral pleural effusions.  Electronically Signed   By: Lucienne Capers M.D.   On: 02/17/2016 06:22      Subjective: Doing well this morning. No shortness of breath or chest pain. No further dizziness or lightheadedness. Has been ambulating well. Tolerating diet. No complaints.  Discharge Exam: Vitals:   02/18/16 2039 02/19/16 0617  BP: 137/74 108/71  Pulse: 78 73  Resp: 19 19  Temp: 98.7 F (37.1 C) 98.4 F (36.9 C)   Vitals:   02/18/16 0451 02/18/16 1156 02/18/16 2039 02/19/16 0617  BP: (!) 133/59 (!) 96/58 137/74 108/71  Pulse: 70 72 78 73  Resp: '19 18 19 19  '$ Temp: 97.8 F (36.6 C) 97.8 F (36.6 C) 98.7 F (37.1 C) 98.4 F (36.9 C)  TempSrc: Oral Oral Oral Oral  SpO2: 99% 99% 97% 99%  Weight: 101.6 kg (224 lb)   100.2 kg (221 lb)  Height:        General: Pt is alert, awake, not in acute distress Cardiovascular: RRR, S1/S2 +, no rubs, no gallops Respiratory: CTA bilaterally, no wheezing, no rhonchi Abdominal: Soft, NT, ND, bowel sounds + Extremities: trace edema, no cyanosis    The results of significant diagnostics from this hospitalization (  including imaging, microbiology, ancillary and laboratory) are listed below for reference.     Microbiology: No results found for this or any previous visit (from the past 240 hour(s)).   Labs: BNP (last 3 results) No results for input(s): BNP in the last 8760 hours. Basic Metabolic Panel:  Recent Labs Lab 02/17/16 0216 02/17/16 1250 02/18/16 0411 02/19/16 0158  NA 124* 124* 127* 125*  K 3.4* 3.7 4.2 4.2  CL 90* 92* 94* 91*  CO2 '27 26 24 26  '$ GLUCOSE 189* 161* 132* 152*  BUN '17 16 16 15  '$ CREATININE 1.08 1.07 0.98 1.00  CALCIUM 8.7* 8.4* 8.7* 9.1   Liver Function Tests:  Recent Labs Lab 02/17/16 0216  AST 20  ALT 14*  ALKPHOS 101  BILITOT 1.6*  PROT 6.8  ALBUMIN 3.5   No results for input(s): LIPASE, AMYLASE in the last 168 hours. No results for input(s): AMMONIA in the last 168 hours. CBC:  Recent Labs Lab 02/17/16 0201  02/18/16 0411 02/19/16 0158  WBC 4.3 4.5 4.3  NEUTROABS  --   --  3.0  HGB 10.1* 9.5* 9.9*  HCT 32.6* 30.8* 32.8*  MCV 75.8* 77.2* 77.4*  PLT 177 163 168   Cardiac Enzymes:  Recent Labs Lab 02/17/16 1122 02/17/16 1827  TROPONINI <0.03 <0.03   BNP: Invalid input(s): POCBNP CBG:  Recent Labs Lab 02/18/16 0546 02/18/16 1153 02/18/16 1638 02/18/16 2042 02/19/16 0551  GLUCAP 145* 144* 160* 163* 127*   D-Dimer No results for input(s): DDIMER in the last 72 hours. Hgb A1c  Recent Labs  02/18/16 0411  HGBA1C 7.1*   Lipid Profile No results for input(s): CHOL, HDL, LDLCALC, TRIG, CHOLHDL, LDLDIRECT in the last 72 hours. Thyroid function studies No results for input(s): TSH, T4TOTAL, T3FREE, THYROIDAB in the last 72 hours.  Invalid input(s): FREET3 Anemia work up  Recent Labs  02/18/16 1110  FERRITIN 13*  TIBC 469*  IRON 25*   Urinalysis    Component Value Date/Time   COLORURINE YELLOW 02/17/2016 0216   APPEARANCEUR CLEAR 02/17/2016 0216   LABSPEC 1.013 02/17/2016 0216   PHURINE 6.0 02/17/2016 0216   GLUCOSEU NEGATIVE 02/17/2016 0216   HGBUR NEGATIVE 02/17/2016 0216   BILIRUBINUR NEGATIVE 02/17/2016 0216   KETONESUR NEGATIVE 02/17/2016 0216   PROTEINUR NEGATIVE 02/17/2016 0216   NITRITE NEGATIVE 02/17/2016 0216   LEUKOCYTESUR NEGATIVE 02/17/2016 0216   Sepsis Labs Invalid input(s): PROCALCITONIN,  WBC,  LACTICIDVEN Microbiology No results found for this or any previous visit (from the past 240 hour(s)).   Time coordinating discharge: Over 30 minutes  SIGNED:  Dessa Phi, DO [facility] Pager [phone]  If 7PM-7AM, please contact night-coverage [email or web address] Password [email or web address] 02/19/2016, 8:56 AM

## 2016-02-19 NOTE — Discharge Instructions (Signed)
Recommendations for Outpatient Follow-up:  1. Follow up with PCP in 1-2 weeks 2. Please obtain BMP/CBC (blood work) in one week  3. You need to follow up on your hemoglobin, possible blood loss anemia, and iron levels, and possibly EGD/colonoscopy    Syncope Syncope is a medical term for fainting or passing out. This means you lose consciousness and drop to the ground. People are generally unconscious for less than 5 minutes. You may have some muscle twitches for up to 15 seconds before waking up and returning to normal. Syncope occurs more often in older adults, but it can happen to anyone. While most causes of syncope are not dangerous, syncope can be a sign of a serious medical problem. It is important to seek medical care.  CAUSES  Syncope is caused by a sudden drop in blood flow to the brain. The specific cause is often not determined. Factors that can bring on syncope include:  Taking medicines that lower blood pressure.  Sudden changes in posture, such as standing up quickly.  Taking more medicine than prescribed.  Standing in one place for too long.  Seizure disorders.  Dehydration and excessive exposure to heat.  Low blood sugar (hypoglycemia).  Straining to have a bowel movement.  Heart disease, irregular heartbeat, or other circulatory problems.  Fear, emotional distress, seeing blood, or severe pain. SYMPTOMS  Right before fainting, you may:  Feel dizzy or light-headed.  Feel nauseous.  See all white or all black in your field of vision.  Have cold, clammy skin. DIAGNOSIS  Your health care provider will ask about your symptoms, perform a physical exam, and perform an electrocardiogram (ECG) to record the electrical activity of your heart. Your health care provider may also perform other heart or blood tests to determine the cause of your syncope which may include:  Transthoracic echocardiogram (TTE). During echocardiography, sound waves are used to evaluate how  blood flows through your heart.  Transesophageal echocardiogram (TEE).  Cardiac monitoring. This allows your health care provider to monitor your heart rate and rhythm in real time.  Holter monitor. This is a portable device that records your heartbeat and can help diagnose heart arrhythmias. It allows your health care provider to track your heart activity for several days, if needed.  Stress tests by exercise or by giving medicine that makes the heart beat faster. TREATMENT  In most cases, no treatment is needed. Depending on the cause of your syncope, your health care provider may recommend changing or stopping some of your medicines. HOME CARE INSTRUCTIONS  Have someone stay with you until you feel stable.  Do not drive, use machinery, or play sports until your health care provider says it is okay.  Keep all follow-up appointments as directed by your health care provider.  Lie down right away if you start feeling like you might faint. Breathe deeply and steadily. Wait until all the symptoms have passed.  Drink enough fluids to keep your urine clear or pale yellow.  If you are taking blood pressure or heart medicine, get up slowly and take several minutes to sit and then stand. This can reduce dizziness. SEEK IMMEDIATE MEDICAL CARE IF:   You have a severe headache.  You have unusual pain in the chest, abdomen, or back.  You are bleeding from your mouth or rectum, or you have black or tarry stool.  You have an irregular or very fast heartbeat.  You have pain with breathing.  You have repeated fainting or seizure-like  jerking during an episode.  You faint when sitting or lying down.  You have confusion.  You have trouble walking.  You have severe weakness.  You have vision problems. If you fainted, call your local emergency services (911 in [place] Do not drive yourself to the hospital.    This information is not intended to replace advice given to you by your health  care provider. Make sure you discuss any questions you have with your health care provider.   Document Released: 05/05/2005 Document Revised: 09/19/2014 Document Reviewed: 07/04/2011 Elsevier Interactive Patient Education 2016 [employer].    Iron Deficiency Anemia, Adult Anemia is a condition in which there are less red blood cells or hemoglobin in the blood than normal. Hemoglobin is the part of red blood cells that carries oxygen. Iron deficiency anemia is anemia caused by too little iron. It is the most common type of anemia. It may leave you tired and short of breath. CAUSES   Lack of iron in the diet.  Poor absorption of iron, as seen with intestinal disorders.  Intestinal bleeding.  Heavy periods. SIGNS AND SYMPTOMS  Mild anemia may not be noticeable. Symptoms may include:  Fatigue.  Headache.  Pale skin.  Weakness.  Tiredness.  Shortness of breath.  Dizziness.  Cold hands and feet.  Fast or irregular heartbeat. DIAGNOSIS  Diagnosis requires a thorough evaluation and physical exam by your health care provider. Blood tests are generally used to confirm iron deficiency anemia. Additional tests may be done to find the underlying cause of your anemia. These may include:  Testing for blood in the stool (fecal occult blood test).  A procedure to see inside the colon and rectum (colonoscopy).  A procedure to see inside the esophagus and stomach (endoscopy). TREATMENT  Iron deficiency anemia is treated by correcting the cause of the deficiency. Treatment may involve:  Adding iron-rich foods to your diet.  Taking iron supplements. Pregnant or breastfeeding women need to take extra iron because their normal diet usually does not provide the required amount.  Taking vitamins. Vitamin C improves the absorption of iron. Your health care provider may recommend that you take your iron tablets with a glass of orange juice or vitamin C supplement.  Medicines to make  heavy menstrual flow lighter.  Surgery. HOME CARE INSTRUCTIONS   Take iron as directed by your health care provider.  If you cannot tolerate taking iron supplements by mouth, talk to your health care provider about taking them through a vein (intravenously) or an injection into a muscle.  For the best iron absorption, iron supplements should be taken on an empty stomach. If you cannot tolerate them on an empty stomach, you may need to take them with food.  Do not drink milk or take antacids at the same time as your iron supplements. Milk and antacids may interfere with the absorption of iron.  Iron supplements can cause constipation. Make sure to include fiber in your diet to prevent constipation. A stool softener may also be recommended.  Take vitamins as directed by your health care provider.  Eat a diet rich in iron. Foods high in iron include liver, lean beef, whole-grain bread, eggs, dried fruit, and dark green leafy vegetables. SEEK IMMEDIATE MEDICAL CARE IF:   You faint. If this happens, do not drive. Call your local emergency services (911 in [place] if no other help is available.  You have chest pain.  You feel nauseous or vomit.  You have severe or increased  shortness of breath with activity.  You feel weak.  You have a rapid heartbeat.  You have unexplained sweating.  You become light-headed when getting up from a chair or bed. MAKE SURE YOU:   Understand these instructions.  Will watch your condition.  Will get help right away if you are not doing well or get worse.   This information is not intended to replace advice given to you by your health care provider. Make sure you discuss any questions you have with your health care provider.   Document Released: 05/02/2000 Document Revised: 05/26/2014 Document Reviewed: 01/10/2013 Elsevier Interactive Patient Education [employer].

## 2016-02-26 DIAGNOSIS — E871 Hypo-osmolality and hyponatremia: Secondary | ICD-10-CM | POA: Diagnosis not present

## 2016-03-10 DIAGNOSIS — R188 Other ascites: Secondary | ICD-10-CM | POA: Diagnosis not present

## 2016-03-19 DIAGNOSIS — D509 Iron deficiency anemia, unspecified: Secondary | ICD-10-CM | POA: Diagnosis not present

## 2016-03-19 DIAGNOSIS — R55 Syncope and collapse: Secondary | ICD-10-CM | POA: Diagnosis not present

## 2016-03-19 DIAGNOSIS — K2970 Gastritis, unspecified, without bleeding: Secondary | ICD-10-CM | POA: Diagnosis not present

## 2016-03-19 DIAGNOSIS — K7460 Unspecified cirrhosis of liver: Secondary | ICD-10-CM | POA: Diagnosis not present

## 2016-03-19 DIAGNOSIS — Z6837 Body mass index (BMI) 37.0-37.9, adult: Secondary | ICD-10-CM | POA: Diagnosis not present

## 2016-03-19 DIAGNOSIS — E1122 Type 2 diabetes mellitus with diabetic chronic kidney disease: Secondary | ICD-10-CM | POA: Diagnosis not present

## 2016-03-19 DIAGNOSIS — E871 Hypo-osmolality and hyponatremia: Secondary | ICD-10-CM | POA: Diagnosis not present

## 2016-03-27 ENCOUNTER — Encounter: Payer: Self-pay | Admitting: Gastroenterology

## 2016-04-02 ENCOUNTER — Encounter: Payer: Self-pay | Admitting: Vascular Surgery

## 2016-04-15 ENCOUNTER — Ambulatory Visit (INDEPENDENT_AMBULATORY_CARE_PROVIDER_SITE_OTHER): Payer: Commercial Managed Care - HMO | Admitting: Vascular Surgery

## 2016-04-15 ENCOUNTER — Encounter: Payer: Self-pay | Admitting: Vascular Surgery

## 2016-04-15 VITALS — BP 108/75 | HR 80 | Temp 98.4°F | Resp 16 | Ht 67.0 in | Wt 222.0 lb

## 2016-04-15 DIAGNOSIS — I83893 Varicose veins of bilateral lower extremities with other complications: Secondary | ICD-10-CM

## 2016-04-15 NOTE — Progress Notes (Signed)
Subjective:     Patient ID: [name], male   DOB: 01-27-1941, 75 y.o.   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 He had laser ablation procedures performed by me bilaterally several years ago and had early good result but recently has developed recurrent varicosities. He did develop a slight ulceration in the left pretibial region which is now healed. He also scratch at another varix on the right leg and had some bleeding which resolved. He does not wear elastic compression stockings on regular basis.  Past Medical History:  Diagnosis Date  . Adenomatous polyps 06/2004  . Alcoholic cirrhosis of liver with ascites ([place])   . Alcoholism ([place])   . Anxiety    PTSD- no meds  . Atrial fibrillation ([place])   . Atrial flutter ([place])   . Diabetes mellitus without complication ([place])   . Diverticulosis   . GERD (gastroesophageal reflux disease)   . Hemorrhoids   . Hiatal hernia   . Hx of hernia repair   . Hyperlipidemia   . Hypertension   . Iron deficiency anemia   . Pedal edema   . Ulcer ([place])   . Varicose veins     Social History  Substance Use Topics  . Smoking status: Former Smoker    Types: Cigarettes    Quit date: 01/16/2011  . Smokeless tobacco: Never Used  . Alcohol use 0.0 oz/week    12 - 15 Cans of beer per week     Comment: 4-5 [place] daily    Family History  Problem Relation Age of Onset  . Colon polyps Brother   . Prostate cancer Brother   . Heart disease Father     rheumatic fever as child  . Stroke Mother   . Colon cancer Neg Hx   . Esophageal cancer Neg Hx   . Rectal cancer Neg Hx   . Stomach cancer Neg Hx     Allergies  Allergen Reactions  . Lisinopril Anaphylaxis    Hyperkalemia, Dizziness   . Atorvastatin     REACTION: Marked leg fatigue  . Sulfonamide Derivatives     Unsure of reaction     Current Outpatient Prescriptions:  .  calcium carbonate (TUMS EX) 750 MG  chewable tablet, Chew 1 tablet by mouth as needed for heartburn., Disp: , Rfl:  .  ferrous gluconate (FERGON) 324 MG tablet, Take 1 tablet (324 mg total) by mouth 3 (three) times daily with meals., Disp: 90 tablet, Rfl: 0 .  furosemide (LASIX) 40 MG tablet, Take 40 mg by mouth 2 (two) times daily., Disp: , Rfl:  .  hydrALAZINE (APRESOLINE) 25 MG tablet, Take 25 mg by mouth daily as needed (high blood pressure). , Disp: , Rfl:  .  linaclotide (LINZESS) 145 MCG CAPS capsule, Take 145 mcg by mouth daily as needed (constipation)., Disp: , Rfl:  .  metoprolol (TOPROL XL) 50 MG 24 hr tablet, Take 50 mg by mouth daily as needed (high blood pressure). , Disp: , Rfl:  .  Multiple Vitamin (MULTIVITAMIN WITH MINERALS) TABS, Take 1 tablet by mouth at bedtime., Disp: , Rfl:  .  omeprazole (PRILOSEC) 20 MG capsule, Take 1 capsule (20 mg total) by mouth 2 (two) times daily. (Patient taking differently: Take 40 mg by mouth 2 (two) times daily. ), Disp: 60 capsule, Rfl: 5 .  Probiotic Product (ALIGN) 4 MG CAPS, Take 1 capsule by mouth daily., Disp: , Rfl:  .  spironolactone (ALDACTONE) 100 MG tablet, Take 1 tablet (100 mg total) by mouth 2 (two) times daily., Disp: 60 tablet, Rfl: 5  Vitals:   04/15/16 1438  BP: 108/75  Pulse: 80  Resp: 16  Temp: 98.4 F (36.9 C)  SpO2: 97%  Weight: 222 lb (100.7 kg)  Height: '5\' 7"'$  (1.702 m)    Body mass index is 34.77 kg/m.         Review of Systems Denies chest pain but does have dyspnea on exertion and instability of gait    Objective:   Physical Exam BP 108/75 (BP Location: Left Arm, Patient Position: Sitting, Cuff Size: Large)   Pulse 80   Temp 98.4 F (36.9 C)   Resp 16   Ht '5\' 7"'$  (1.702 m)   Wt 222 lb (100.7 kg)   SpO2 97%   BMI 34.77 kg/m   Gen. obese male no apparent distress alert and oriented 3 Lungs no rhonchi or wheezing F leg with bulging varicosities and medial thigh and calf and 1+ chronic edema with hyperpigmentation lower  third Right leg with few bulging varicosities in the medial and lateral thigh with 1+ chronic edema no ulceration  I reviewed the formal venous ultrasound from last study and also performed a bedside SonoSite ultrasound exam today Patient has an absent right great saphenous vein from previous ablation Left great saphenous vein is patent for a short distance and then a very tortuous with no segment long enough to treat with laser ablation    Assessment:     Recurrent varicosities bilaterally with no segment of great saphenous vein suitable for repeat laser ablation    Plan:     Would not recommend any treatment at this time other than elastic compression stockings If patient should have increasing pain or bleeding we can consider stab phlebectomy only He will be in touch with [place] if he desires further evaluation

## 2016-04-16 ENCOUNTER — Ambulatory Visit (INDEPENDENT_AMBULATORY_CARE_PROVIDER_SITE_OTHER): Payer: Commercial Managed Care - HMO | Admitting: Gastroenterology

## 2016-04-16 ENCOUNTER — Encounter: Payer: Self-pay | Admitting: Gastroenterology

## 2016-04-16 VITALS — BP 122/70 | HR 72 | Ht 64.25 in | Wt 224.2 lb

## 2016-04-16 DIAGNOSIS — D509 Iron deficiency anemia, unspecified: Secondary | ICD-10-CM | POA: Diagnosis not present

## 2016-04-16 DIAGNOSIS — R195 Other fecal abnormalities: Secondary | ICD-10-CM | POA: Diagnosis not present

## 2016-04-16 MED ORDER — NA SULFATE-K SULFATE-MG SULF 17.5-3.13-1.6 GM/177ML PO SOLN: 1.0000 | Freq: Once | ORAL | 0 refills | Status: AC

## 2016-04-16 NOTE — Progress Notes (Signed)
History of Present Illness: This is a 75 year old male referred by Shon Baton, MD for the evaluation of iron deficiency anemia and heme + stool. Last office visit was in 02/2013 for cirrhosis with ascites. He related chronic focal lower chest pain improved with pressing on the location. Pain also improved with PPI therapy. Denies weight loss, abdominal pain, constipation, diarrhea, change in stool caliber, melena, hematochezia, nausea, vomiting, dysphagia.  EGD 12/2012 chronic gastritis.  Colon 05/2011 2 polyps, diverticulosis, internal hemorrhoids.    Allergies  Allergen Reactions  . Lisinopril Anaphylaxis    Hyperkalemia, Dizziness   . Lipitor [Atorvastatin]     REACTION: Marked leg fatigue  . Sulfonamide Derivatives     Unsure of reaction   Outpatient Medications Prior to Visit  Medication Sig Dispense Refill  . calcium carbonate (TUMS EX) 750 MG chewable tablet Chew 1 tablet by mouth as needed for heartburn.    . ferrous gluconate (FERGON) 324 MG tablet Take 1 tablet (324 mg total) by mouth 3 (three) times daily with meals. 90 tablet 0  . furosemide (LASIX) 40 MG tablet Take 40 mg by mouth 2 (two) times daily.    . hydrALAZINE (APRESOLINE) 25 MG tablet Take 25 mg by mouth daily as needed (high blood pressure).     Marland Kitchen linaclotide (LINZESS) 145 MCG CAPS capsule Take 145 mcg by mouth daily as needed (constipation).    . metoprolol (TOPROL XL) 50 MG 24 hr tablet Take 50 mg by mouth daily as needed (high blood pressure).     . Multiple Vitamin (MULTIVITAMIN WITH MINERALS) TABS Take 1 tablet by mouth at bedtime.    Marland Kitchen omeprazole (PRILOSEC) 20 MG capsule Take 1 capsule (20 mg total) by mouth 2 (two) times daily. (Patient taking differently: Take 40 mg by mouth 2 (two) times daily. ) 60 capsule 5  . Probiotic Product (ALIGN) 4 MG CAPS Take 1 capsule by mouth as needed.     Marland Kitchen spironolactone (ALDACTONE) 100 MG tablet Take 1 tablet (100 mg total) by mouth 2 (two) times daily. 60 tablet 5    No facility-administered medications prior to visit.    Past Medical History:  Diagnosis Date  . Adenomatous polyps 06/2004  . Alcoholic cirrhosis of liver with ascites ([place])   . Alcoholism ([place])   . Anxiety    PTSD- no meds  . Atrial fibrillation ([place])   . Atrial flutter ([place])   . Diabetes mellitus without complication ([place])   . Diverticulosis   . GERD (gastroesophageal reflux disease)   . Hemorrhoids   . Hiatal hernia   . Hx of hernia repair   . Hyperlipidemia   . Hypertension   . Iron deficiency anemia   . Pedal edema   . Ulcer ([place])   . Varicose veins    Past Surgical History:  Procedure Laterality Date  . ATRIAL ABLATION SURGERY    . COLONOSCOPY    . POLYPECTOMY    . TONSILLECTOMY    . UMBILICAL HERNIA REPAIR    . VARICOSE VEIN SURGERY     x4   Social History   Social History  . Marital status: Divorced    Spouse name: N/A  . Number of children: 5  . Years of education: N/A   Occupational History  . retired Retired   Social History Main Topics  . Smoking status: Former Smoker    Types: Cigarettes    Quit date: 01/16/2011  . Smokeless tobacco: Never Used  .  Alcohol use 0.0 oz/week    12 - 15 Cans of beer per week     Comment: 4-5 [place] daily  . Drug use: No  . Sexual activity: Not Asked   Other Topics Concern  . None   Social History Narrative  . None   Family History  Problem Relation Age of Onset  . Colon polyps Brother   . Prostate cancer Brother   . Heart disease Father     rheumatic fever as child  . Stroke Mother   . Colon cancer Neg Hx   . Esophageal cancer Neg Hx   . Rectal cancer Neg Hx   . Stomach cancer Neg Hx      Review of Systems: Pertinent positive and negative review of systems were noted in the above HPI section. All other review of systems were otherwise negative.   Physical Exam: General: Well developed, well nourished, no acute distress Head: Normocephalic and atraumatic Eyes:  sclerae anicteric, EOMI Ears:  Normal auditory acuity Mouth: No deformity or lesions Neck: Supple, no masses or thyromegaly Lungs: Clear throughout to auscultation Chest: focal tenderness over left lower chest wall Heart: Regular rate and rhythm; no murmurs, rubs or bruits Abdomen: Soft, protuberant, non tender and mildly distended. No masses, hepatosplenomegaly or hernias noted. Normal Bowel sounds Rectal: deferred to colonoscopy Musculoskeletal: Symmetrical with no gross deformities  Skin: No lesions on visible extremities Pulses:  Normal pulses noted Extremities: No clubbing. Chronic venous stasis changes, trace pedal edema.  Neurological: Alert oriented x 4, grossly nonfocal Cervical Nodes:  No significant cervical adenopathy Inguinal Nodes: No significant inguinal adenopathy Psychological:  Alert and cooperative. Normal mood and affect  Assessment and Recommendations:  1. Iron deficiency anemia, heme + stool. R/O ulcer colorectal neoplasm, gastropathy, AVM's, etc. Schedule colonoscopy and EGD. Iron replacement. The risks (including bleeding, perforation, infection, missed lesions, medication reactions and possible hospitalization or surgery if complications occur), benefits, and alternatives to colonoscopy with possible biopsy and possible polypectomy were discussed with the patient and they consent to proceed. The risks (including bleeding, perforation, infection, missed lesions, medication reactions and possible hospitalization or surgery if complications occur), benefits, and alternatives to endoscopy with possible biopsy and possible dilation were discussed with the patient and they consent to proceed.   2. GERD. Continue omeprazole 40 mg bid. Follow antireflux measures. EGD as above.  3. Chest pain with GI and musculoskeletal component.   4. Cirrhosis with history of ascites. Abd [place] in 05/2015 was normal. Current treated with Lasix and Aldactone.    cc: Shon Baton, MD [address] [place], [place] [address]

## 2016-04-16 NOTE — Patient Instructions (Signed)
You have been scheduled for an endoscopy and colonoscopy. Please follow the written instructions given to you at your visit today. Please pick up your prep supplies at the pharmacy within the next 1-3 days. If you use inhalers (even only as needed), please bring them with you on the day of your procedure. Your physician has requested that you go to www.startemmi.com and enter the access code given to you at your visit today. This web site gives a general overview about your procedure. However, you should still follow specific instructions given to you by our office regarding your preparation for the procedure.  Normal BMI (Body Mass Index- based on height and weight) is between 23 and 30. Your BMI today is Body mass index is 38.19 kg/m. Marland Kitchen Please consider follow up  regarding your BMI with your Primary Care Provider.  Thank you for choosing me and [facility].  Pricilla Riffle. Dagoberto Ligas., MD., [facility] Regal

## 2016-04-21 DIAGNOSIS — I10 Essential (primary) hypertension: Secondary | ICD-10-CM | POA: Diagnosis not present

## 2016-04-21 DIAGNOSIS — E1122 Type 2 diabetes mellitus with diabetic chronic kidney disease: Secondary | ICD-10-CM | POA: Diagnosis not present

## 2016-04-21 DIAGNOSIS — E784 Other hyperlipidemia: Secondary | ICD-10-CM | POA: Diagnosis not present

## 2016-04-21 DIAGNOSIS — Z125 Encounter for screening for malignant neoplasm of prostate: Secondary | ICD-10-CM | POA: Diagnosis not present

## 2016-04-28 DIAGNOSIS — K766 Portal hypertension: Secondary | ICD-10-CM | POA: Diagnosis not present

## 2016-04-28 DIAGNOSIS — E871 Hypo-osmolality and hyponatremia: Secondary | ICD-10-CM | POA: Diagnosis not present

## 2016-04-28 DIAGNOSIS — N183 Chronic kidney disease, stage 3 (moderate): Secondary | ICD-10-CM | POA: Diagnosis not present

## 2016-04-28 DIAGNOSIS — Z6835 Body mass index (BMI) 35.0-35.9, adult: Secondary | ICD-10-CM | POA: Diagnosis not present

## 2016-04-28 DIAGNOSIS — K7460 Unspecified cirrhosis of liver: Secondary | ICD-10-CM | POA: Diagnosis not present

## 2016-04-28 DIAGNOSIS — Z0000 Encounter for general adult medical examination without abnormal findings: Secondary | ICD-10-CM | POA: Diagnosis not present

## 2016-04-28 DIAGNOSIS — F1010 Alcohol abuse, uncomplicated: Secondary | ICD-10-CM | POA: Diagnosis not present

## 2016-04-28 DIAGNOSIS — E1122 Type 2 diabetes mellitus with diabetic chronic kidney disease: Secondary | ICD-10-CM | POA: Diagnosis not present

## 2016-04-28 DIAGNOSIS — R188 Other ascites: Secondary | ICD-10-CM | POA: Diagnosis not present

## 2016-05-05 DIAGNOSIS — Z1212 Encounter for screening for malignant neoplasm of rectum: Secondary | ICD-10-CM | POA: Diagnosis not present

## 2016-06-03 DIAGNOSIS — R05 Cough: Secondary | ICD-10-CM | POA: Diagnosis not present

## 2016-06-13 ENCOUNTER — Encounter: Payer: Self-pay | Admitting: Gastroenterology

## 2016-06-19 DIAGNOSIS — J189 Pneumonia, unspecified organism: Secondary | ICD-10-CM | POA: Diagnosis not present

## 2016-06-23 ENCOUNTER — Telehealth: Payer: Self-pay | Admitting: Gastroenterology

## 2016-06-23 ENCOUNTER — Other Ambulatory Visit: Payer: Self-pay | Admitting: Internal Medicine

## 2016-06-23 DIAGNOSIS — J90 Pleural effusion, not elsewhere classified: Secondary | ICD-10-CM

## 2016-06-23 DIAGNOSIS — E871 Hypo-osmolality and hyponatremia: Secondary | ICD-10-CM | POA: Diagnosis not present

## 2016-06-23 NOTE — Telephone Encounter (Signed)
Do you want to charge? 

## 2016-06-23 NOTE — Telephone Encounter (Signed)
No charge. 

## 2016-06-25 ENCOUNTER — Other Ambulatory Visit: Payer: Commercial Managed Care - HMO

## 2016-06-25 ENCOUNTER — Ambulatory Visit
Admission: RE | Admit: 2016-06-25 | Discharge: 2016-06-25 | Disposition: A | Discharge status: Home / Self Care | Payer: Commercial Managed Care - HMO | Source: Ambulatory Visit | Attending: Internal Medicine | Admitting: Internal Medicine

## 2016-06-25 DIAGNOSIS — J90 Pleural effusion, not elsewhere classified: Secondary | ICD-10-CM | POA: Diagnosis not present

## 2016-06-25 MED ORDER — IOPAMIDOL (ISOVUE-300) INJECTION 61%
75.0000 mL | Freq: Once | INTRAVENOUS | Status: AC | PRN
  Administered 2016-06-25: 75 mL via INTRAVENOUS

## 2016-06-26 ENCOUNTER — Encounter: Payer: Commercial Managed Care - HMO | Admitting: Gastroenterology

## 2016-07-09 ENCOUNTER — Other Ambulatory Visit (HOSPITAL_COMMUNITY): Payer: Self-pay | Admitting: Internal Medicine

## 2016-07-09 DIAGNOSIS — R188 Other ascites: Principal | ICD-10-CM

## 2016-07-09 DIAGNOSIS — K7460 Unspecified cirrhosis of liver: Secondary | ICD-10-CM

## 2016-07-14 ENCOUNTER — Ambulatory Visit (HOSPITAL_COMMUNITY)
Admission: RE | Admit: 2016-07-14 | Discharge: 2016-07-14 | Disposition: A | Discharge status: Home / Self Care | Payer: Medicare HMO | Source: Ambulatory Visit | Attending: Internal Medicine | Admitting: Internal Medicine

## 2016-07-14 DIAGNOSIS — K7031 Alcoholic cirrhosis of liver with ascites: Secondary | ICD-10-CM | POA: Diagnosis not present

## 2016-07-14 DIAGNOSIS — R188 Other ascites: Secondary | ICD-10-CM | POA: Diagnosis not present

## 2016-07-14 DIAGNOSIS — K7460 Unspecified cirrhosis of liver: Secondary | ICD-10-CM

## 2016-07-14 NOTE — Procedures (Signed)
Ultrasound-guided  therapeutic paracentesis performed yielding 4.8 liters of hazy, yellow fluid. No immediate complications.

## 2016-07-30 ENCOUNTER — Ambulatory Visit (AMBULATORY_SURGERY_CENTER): Payer: Medicare HMO | Admitting: Gastroenterology

## 2016-07-30 ENCOUNTER — Encounter: Payer: Self-pay | Admitting: Gastroenterology

## 2016-07-30 VITALS — BP 140/77 | HR 81 | Temp 98.0°F | Resp 17 | Ht 64.75 in | Wt 224.0 lb

## 2016-07-30 DIAGNOSIS — K635 Polyp of colon: Secondary | ICD-10-CM

## 2016-07-30 DIAGNOSIS — D509 Iron deficiency anemia, unspecified: Secondary | ICD-10-CM

## 2016-07-30 DIAGNOSIS — D123 Benign neoplasm of transverse colon: Secondary | ICD-10-CM | POA: Diagnosis not present

## 2016-07-30 DIAGNOSIS — D124 Benign neoplasm of descending colon: Secondary | ICD-10-CM

## 2016-07-30 DIAGNOSIS — R195 Other fecal abnormalities: Secondary | ICD-10-CM | POA: Diagnosis not present

## 2016-07-30 DIAGNOSIS — I4891 Unspecified atrial fibrillation: Secondary | ICD-10-CM | POA: Diagnosis not present

## 2016-07-30 DIAGNOSIS — D125 Benign neoplasm of sigmoid colon: Secondary | ICD-10-CM

## 2016-07-30 DIAGNOSIS — I10 Essential (primary) hypertension: Secondary | ICD-10-CM | POA: Diagnosis not present

## 2016-07-30 DIAGNOSIS — D122 Benign neoplasm of ascending colon: Secondary | ICD-10-CM | POA: Diagnosis not present

## 2016-07-30 DIAGNOSIS — K31811 Angiodysplasia of stomach and duodenum with bleeding: Secondary | ICD-10-CM

## 2016-07-30 DIAGNOSIS — K219 Gastro-esophageal reflux disease without esophagitis: Secondary | ICD-10-CM | POA: Diagnosis not present

## 2016-07-30 MED ORDER — SODIUM CHLORIDE 0.9 % IV SOLN: 500.0000 mL | INTRAVENOUS | Status: DC

## 2016-07-30 NOTE — Progress Notes (Signed)
Pt requested urinal.  Out put 200 ml clear yellow urine.  No problems noted in the recovery room. maw

## 2016-07-30 NOTE — Patient Instructions (Signed)
YOU HAD AN ENDOSCOPIC PROCEDURE TODAY AT [facility]:   Refer to the procedure report that was given to you for any specific questions about what was found during the examination.  If the procedure report does not answer your questions, please call your gastroenterologist to clarify.  If you requested that your care partner not be given the details of your procedure findings, then the procedure report has been included in a sealed envelope for you to review at your convenience later.  YOU SHOULD EXPECT: Some feelings of bloating in the abdomen. Passage of more gas than usual.  Walking can help get rid of the air that was put into your GI tract during the procedure and reduce the bloating. If you had a lower endoscopy (such as a colonoscopy or flexible sigmoidoscopy) you may notice spotting of blood in your stool or on the toilet paper. If you underwent a bowel prep for your procedure, you may not have a normal bowel movement for a few days.  Please Note:  You might notice some irritation and congestion in your nose or some drainage.  This is from the oxygen used during your procedure.  There is no need for concern and it should clear up in a day or so.  SYMPTOMS TO REPORT IMMEDIATELY:   Following lower endoscopy (colonoscopy or flexible sigmoidoscopy):  Excessive amounts of blood in the stool  Significant tenderness or worsening of abdominal pains  Swelling of the abdomen that is new, acute  Fever of 100F or higher   Following upper endoscopy (EGD)  Vomiting of blood or coffee ground material  New chest pain or pain under the shoulder blades  Painful or persistently difficult swallowing  New shortness of breath  Fever of 100F or higher  Black, tarry-looking stools  For urgent or emergent issues, a gastroenterologist can be reached at any hour by calling [phone].   DIET:  We do recommend a small meal at first, but then you may proceed to your regular diet.  Drink  plenty of fluids but you should avoid alcoholic beverages for 24 hours.  ACTIVITY:  You should plan to take it easy for the rest of today and you should NOT DRIVE or use heavy machinery until tomorrow (because of the sedation medicines used during the test).    FOLLOW UP: Our staff will call the number listed on your records the next business day following your procedure to check on you and address any questions or concerns that you may have regarding the information given to you following your procedure. If we do not reach you, we will leave a message.  However, if you are feeling well and you are not experiencing any problems, there is no need to return our call.  We will assume that you have returned to your regular daily activities without incident.  If any biopsies were taken you will be contacted by phone or by letter within the next 1-3 weeks.  Please call us at [phone] if you have not heard about the biopsies in 3 weeks.    SIGNATURES/CONFIDENTIALITY: You and/or your care partner have signed paperwork which will be entered into your electronic medical record.  These signatures attest to the fact that that the information above on your After Visit Summary has been reviewed and is understood.  Full responsibility of the confidentiality of this discharge information lies with you and/or your care-partner.   Handouts were given to your care partner on polyps, diverticulosis,  hemorrhoids, a high fiber diet with liberal fluid intake, GERD, and Hiatal hernia. No aspirin, aspirin products,  ibuprofen, naproxen, advil, motrin, aleve, or other non-steroidal anti-inflammatory drugs for 14 days after polyp removal. Continue OMEPRAZOLE 40 mg by mouth twice per day long term. You may resume your current medications today. Call Dr. Virgina Jock to continue taking IRON.  Also call Dr. Virgina Jock and make appointment for 1 month per Dr. Fuller Plan. Await biopsy results. Please call if any questions or  concerns.

## 2016-07-30 NOTE — Progress Notes (Signed)
Duodenal AVM cauterized.

## 2016-07-30 NOTE — Progress Notes (Signed)
Report to PACU, RN, vss, BBS= Clear.  

## 2016-07-30 NOTE — Op Note (Signed)
[facility] Patient Name: [name] Procedure Date: 07/30/2016 2:37 PM MRN: [id number] Endoscopist: Ladene Artist , MD Age: 76 Referring MD:  Date of Birth: 1940/10/11 Gender: Male Account #: [id number] Procedure:                Upper GI endoscopy Indications:              Iron deficiency anemia, Heme positive stool Medicines:                Monitored Anesthesia Care Procedure:                Pre-Anesthesia Assessment:                           - Prior to the procedure, a History and Physical                            was performed, and patient medications and                            allergies were reviewed. The patient's tolerance of                            previous anesthesia was also reviewed. The risks                            and benefits of the procedure and the sedation                            options and risks were discussed with the patient.                            All questions were answered, and informed consent                            was obtained. Prior Anticoagulants: The patient has                            taken no previous anticoagulant or antiplatelet                            agents. ASA Grade Assessment: III - A patient with                            severe systemic disease. After reviewing the risks                            and benefits, the patient was deemed in                            satisfactory condition to undergo the procedure.                           After obtaining informed consent, the endoscope was  passed under direct vision. Throughout the                            procedure, the patient's blood pressure, pulse, and                            oxygen saturations were monitored continuously. The                            Model GIF-HQ190 [id number]) scope was introduced                            through the mouth, and advanced to the second part                            of  duodenum. The upper GI endoscopy was                            accomplished without difficulty. The patient                            tolerated the procedure well. Scope In: Scope Out: Findings:                 There were esophageal mucosal changes suggestive of                            short-segment Barrett's esophagus present in the                            distal esophagus. The maximum longitudinal extent                            of these mucosal changes was 3 cm in length. Mucosa                            was biopsied with a cold forceps for histology                            randomly at intervals of 1 cm in the lower third of                            the esophagus. One specimen bottle was sent to                            pathology.                           The exam of the esophagus was otherwise normal.                           Moderate portal hypertensive gastropathy was found  in the cardia, in the gastric fundus and in the                            gastric body.                           A small hiatal hernia was present.                           The exam of the stomach was otherwise normal.                           A single 4 mm angiodysplastic lesion with bleeding                            was found in the second portion of the duodenum.                            Coagulation for hemostasis using monopolar probe                            was successful.                           The duodenal bulb was normal. Complications:            No immediate complications. Estimated Blood Loss:     Estimated blood loss was minimal. Impression:               - Esophageal mucosal changes suggestive of                            short-segment Barrett's esophagus. Biopsied.                           - Portal hypertensive gastropathy.                           - Small hiatal hernia.                           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 Treated with a monopolar probe.                           - Normal duodenal bulb. Recommendation:           - Patient has a contact number available for                            emergencies. The signs and symptoms of potential                            delayed complications were discussed with the  patient. Return to normal activities tomorrow.                            Written discharge instructions were provided to the                            patient.                           - Resume previous diet.                           - Antireflux measures.                           - Continue present medications including omeprazole                            40 mg po bid long term.                           - No aspirin, ibuprofen, naproxen, or other                            non-steroidal anti-inflammatory drugs for 2 weeks.                           - Await pathology results.                           - IDA and occult blood in stool from AVMs, portal                            gastropathy.                           - Likely will require long term Fe replacement per                            PCP. Ladene Artist, MD 07/30/2016 3:26:51 PM This report has been signed electronically.

## 2016-07-30 NOTE — Op Note (Signed)
[facility] Patient Name: [name] Procedure Date: 07/30/2016 2:37 PM MRN: [id number] Endoscopist: Ladene Artist , MD Age: 76 Referring MD:  Date of Birth: 01-27-1941 Gender: Male Account #: [id number] Procedure:                Colonoscopy Indications:              Heme positive stool, Iron deficiency anemia Medicines:                Monitored Anesthesia Care Procedure:                Pre-Anesthesia Assessment:                           - Prior to the procedure, a History and Physical                            was performed, and patient medications and                            allergies were reviewed. The patient's tolerance of                            previous anesthesia was also reviewed. The risks                            and benefits of the procedure and the sedation                            options and risks were discussed with the patient.                            All questions were answered, and informed consent                            was obtained. Prior Anticoagulants: The patient has                            taken no previous anticoagulant or antiplatelet                            agents. ASA Grade Assessment: III - A patient with                            severe systemic disease. After reviewing the risks                            and benefits, the patient was deemed in                            satisfactory condition to undergo the procedure.                           After obtaining informed consent, the colonoscope  was passed under direct vision. Throughout the                            procedure, the patient's blood pressure, pulse, and                            oxygen saturations were monitored continuously. The                            Model PCF-[id number]) scope was introduced                            through the anus and advanced to the the cecum,                            identified by  appendiceal orifice and ileocecal                            valve. The ileocecal valve, appendiceal orifice,                            and rectum were photographed. The quality of the                            bowel preparation was good. The colonoscopy was                            performed without difficulty. The patient tolerated                            the procedure well. Scope In: 2:47:05 PM Scope Out: 3:02:54 PM Scope Withdrawal Time: 0 hours 13 minutes 57 seconds  Total Procedure Duration: 0 hours 15 minutes 49 seconds  Findings:                 The perianal and digital rectal examinations were                            normal.                           Five sessile polyps were found in the sigmoid colon                            (1) and transverse colon (4). The polyps were 6 to                            7 mm in size. These polyps were removed with a cold                            snare. Resection and retrieval were complete.                           Internal hemorrhoids were found during  retroflexion. The hemorrhoids were small and Grade                            I (internal hemorrhoids that do not prolapse).                           Two sessile polyps were found in the descending                            colon and transverse colon. The polyps were 5 mm in                            size. These polyps were removed with a cold biopsy                            forceps. Resection and retrieval were complete.                           Multiple medium-mouthed diverticula were found in                            the left colon. There was no evidence of                            diverticular bleeding.                           A single medium-sized localized angiodysplastic                            lesion without bleeding was found in the ascending                            colon.                           The exam was otherwise without  abnormality on                            direct and retroflexion views. Complications:            No immediate complications. Estimated blood loss:                            None. Estimated Blood Loss:     Estimated blood loss: none. Impression:               - Five 6 to 7 mm polyps in the sigmoid colon and in                            the transverse colon, removed with a cold snare.                            Resected and retrieved.                           -  Internal hemorrhoids.                           - Two 5 mm polyps in the descending colon and in                            the transverse colon, removed with a cold biopsy                            forceps. Resected and retrieved.                           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 There was                            no evidence of diverticular bleeding.                           - The examination was otherwise normal on direct                            and retroflexion views. Recommendation:           - Repeat colonoscopy in 3 - 5 years for                            surveillance, pending pathology review, if health                            status appropriate.                           - Patient has a contact number available for                            emergencies. The signs and symptoms of potential                            delayed complications were discussed with the                            patient. Return to normal activities tomorrow.                            Written discharge instructions were provided to the                            patient.                           - Resume previous diet.                           -  Continue present medications.                           - Await pathology results.                           - No aspirin, ibuprofen, naproxen, or other                            non-steroidal  anti-inflammatory drugs for 2 weeks                            after polyp removal. Ladene Artist, MD 07/30/2016 3:20:04 PM This report has been signed electronically.

## 2016-07-30 NOTE — Progress Notes (Signed)
Called to room to assist during endoscopic procedure.  Patient ID and intended procedure confirmed with present staff. Received instructions for my participation in the procedure from the performing physician.  

## 2016-07-31 ENCOUNTER — Telehealth: Payer: Self-pay | Admitting: *Deleted

## 2016-07-31 NOTE — Telephone Encounter (Signed)
  Follow up Call-  Call back number 07/30/2016  Post procedure Call Back phone  # [phone]   Permission to leave phone message Yes  Some recent data might be hidden     Patient questions:  Do you have a fever, pain , or abdominal swelling? No. Pain Score  0 *  Have you tolerated food without any problems? Yes.    Have you been able to return to your normal activities? Yes.    Do you have any questions about your discharge instructions: Diet   No. Medications  no Follow up visit  No.  Do you have questions or concerns about your Care? No.  Actions: * If pain score is 4 or above: No action needed, pain <4.

## 2016-08-15 ENCOUNTER — Encounter: Payer: Self-pay | Admitting: Gastroenterology

## 2016-08-20 DIAGNOSIS — Z0271 Encounter for disability determination: Secondary | ICD-10-CM | POA: Diagnosis not present

## 2016-09-02 DIAGNOSIS — F1010 Alcohol abuse, uncomplicated: Secondary | ICD-10-CM | POA: Diagnosis not present

## 2016-09-02 DIAGNOSIS — R911 Solitary pulmonary nodule: Secondary | ICD-10-CM | POA: Diagnosis not present

## 2016-09-02 DIAGNOSIS — I700 Atherosclerosis of aorta: Secondary | ICD-10-CM | POA: Diagnosis not present

## 2016-09-02 DIAGNOSIS — R188 Other ascites: Secondary | ICD-10-CM | POA: Diagnosis not present

## 2016-09-02 DIAGNOSIS — E1122 Type 2 diabetes mellitus with diabetic chronic kidney disease: Secondary | ICD-10-CM | POA: Diagnosis not present

## 2016-09-02 DIAGNOSIS — K766 Portal hypertension: Secondary | ICD-10-CM | POA: Diagnosis not present

## 2016-09-02 DIAGNOSIS — I10 Essential (primary) hypertension: Secondary | ICD-10-CM | POA: Diagnosis not present

## 2016-09-02 DIAGNOSIS — D509 Iron deficiency anemia, unspecified: Secondary | ICD-10-CM | POA: Diagnosis not present

## 2016-09-02 DIAGNOSIS — K7460 Unspecified cirrhosis of liver: Secondary | ICD-10-CM | POA: Diagnosis not present

## 2016-10-21 ENCOUNTER — Other Ambulatory Visit: Payer: Self-pay | Admitting: Internal Medicine

## 2016-10-21 DIAGNOSIS — R188 Other ascites: Secondary | ICD-10-CM

## 2016-10-22 DIAGNOSIS — R188 Other ascites: Secondary | ICD-10-CM | POA: Diagnosis not present

## 2016-10-22 DIAGNOSIS — R161 Splenomegaly, not elsewhere classified: Secondary | ICD-10-CM | POA: Diagnosis not present

## 2016-10-22 DIAGNOSIS — K7460 Unspecified cirrhosis of liver: Secondary | ICD-10-CM | POA: Diagnosis not present

## 2016-10-27 ENCOUNTER — Other Ambulatory Visit (HOSPITAL_COMMUNITY): Payer: Self-pay | Admitting: Internal Medicine

## 2016-10-27 DIAGNOSIS — R188 Other ascites: Secondary | ICD-10-CM

## 2016-10-28 ENCOUNTER — Ambulatory Visit (HOSPITAL_COMMUNITY)
Admission: RE | Admit: 2016-10-28 | Discharge: 2016-10-28 | Disposition: A | Discharge status: Home / Self Care | Payer: Medicare HMO | Source: Ambulatory Visit | Attending: Internal Medicine | Admitting: Internal Medicine

## 2016-10-28 ENCOUNTER — Encounter (HOSPITAL_COMMUNITY): Payer: Self-pay | Admitting: General Surgery

## 2016-10-28 DIAGNOSIS — K7031 Alcoholic cirrhosis of liver with ascites: Secondary | ICD-10-CM | POA: Insufficient documentation

## 2016-10-28 DIAGNOSIS — R188 Other ascites: Secondary | ICD-10-CM

## 2016-10-28 HISTORY — PX: IR PARACENTESIS: IMG2679

## 2016-10-28 MED ORDER — LIDOCAINE HCL 1 % IJ SOLN
INTRAMUSCULAR | Status: DC | PRN
  Administered 2016-10-28: 10 mL

## 2016-10-28 MED ORDER — LIDOCAINE HCL (PF) 1 % IJ SOLN
INTRAMUSCULAR | Status: AC
  Filled 2016-10-28: qty 10

## 2016-10-28 NOTE — Procedures (Signed)
Ultrasound-guided therapeutic paracentesis performed yielding 5 liters of serous colored fluid. No immediate complications. 5L max was given.  Jacara Benito E 2:42 PM 10/28/2016

## 2016-10-29 ENCOUNTER — Ambulatory Visit (HOSPITAL_COMMUNITY): Payer: Commercial Managed Care - HMO

## 2016-11-06 DIAGNOSIS — W458XXA Other foreign body or object entering through skin, initial encounter: Secondary | ICD-10-CM | POA: Diagnosis not present

## 2016-11-06 DIAGNOSIS — T162XXA Foreign body in left ear, initial encounter: Secondary | ICD-10-CM | POA: Diagnosis not present

## 2016-11-06 DIAGNOSIS — S00452A Superficial foreign body of left ear, initial encounter: Secondary | ICD-10-CM | POA: Diagnosis not present

## 2016-11-06 DIAGNOSIS — Z6834 Body mass index (BMI) 34.0-34.9, adult: Secondary | ICD-10-CM | POA: Diagnosis not present

## 2016-11-10 ENCOUNTER — Other Ambulatory Visit (HOSPITAL_COMMUNITY): Payer: Self-pay | Admitting: Internal Medicine

## 2016-11-10 DIAGNOSIS — K7031 Alcoholic cirrhosis of liver with ascites: Secondary | ICD-10-CM

## 2016-11-12 ENCOUNTER — Encounter (HOSPITAL_COMMUNITY): Payer: Self-pay | Admitting: Student

## 2016-11-12 ENCOUNTER — Ambulatory Visit (HOSPITAL_COMMUNITY)
Admission: RE | Admit: 2016-11-12 | Discharge: 2016-11-12 | Disposition: A | Discharge status: Home / Self Care | Payer: Medicare HMO | Source: Ambulatory Visit | Attending: Internal Medicine | Admitting: Internal Medicine

## 2016-11-12 DIAGNOSIS — K7031 Alcoholic cirrhosis of liver with ascites: Secondary | ICD-10-CM

## 2016-11-12 DIAGNOSIS — R188 Other ascites: Secondary | ICD-10-CM | POA: Diagnosis not present

## 2016-11-12 HISTORY — PX: IR PARACENTESIS: IMG2679

## 2016-11-12 MED ORDER — LIDOCAINE HCL 1 % IJ SOLN
INTRAMUSCULAR | Status: DC | PRN
  Administered 2016-11-12: 10 mL

## 2016-11-12 MED ORDER — LIDOCAINE HCL (PF) 1 % IJ SOLN
INTRAMUSCULAR | Status: AC
  Filled 2016-11-12: qty 30

## 2016-11-12 NOTE — Procedures (Signed)
PROCEDURE SUMMARY:  Successful US guided paracentesis from left lateral abdomen.  Yielded 4.2 liters of yellow, hazy fluid.  No immediate complications.  Pt tolerated well.   Specimen was not sent for labs.  Docia Barrier PA-C 11/12/2016 2:15 PM

## 2016-11-24 DIAGNOSIS — R5383 Other fatigue: Secondary | ICD-10-CM | POA: Diagnosis not present

## 2016-11-24 DIAGNOSIS — E1122 Type 2 diabetes mellitus with diabetic chronic kidney disease: Secondary | ICD-10-CM | POA: Diagnosis not present

## 2016-11-24 DIAGNOSIS — K7460 Unspecified cirrhosis of liver: Secondary | ICD-10-CM | POA: Diagnosis not present

## 2016-11-24 DIAGNOSIS — R600 Localized edema: Secondary | ICD-10-CM | POA: Diagnosis not present

## 2016-11-24 DIAGNOSIS — Z1321 Encounter for screening for nutritional disorder: Secondary | ICD-10-CM | POA: Diagnosis not present

## 2016-11-24 DIAGNOSIS — E871 Hypo-osmolality and hyponatremia: Secondary | ICD-10-CM | POA: Diagnosis not present

## 2016-11-24 DIAGNOSIS — Z79899 Other long term (current) drug therapy: Secondary | ICD-10-CM | POA: Diagnosis not present

## 2016-11-24 DIAGNOSIS — I872 Venous insufficiency (chronic) (peripheral): Secondary | ICD-10-CM | POA: Diagnosis not present

## 2016-11-24 DIAGNOSIS — I10 Essential (primary) hypertension: Secondary | ICD-10-CM | POA: Diagnosis not present

## 2016-11-24 DIAGNOSIS — N183 Chronic kidney disease, stage 3 (moderate): Secondary | ICD-10-CM | POA: Diagnosis not present

## 2016-11-27 ENCOUNTER — Other Ambulatory Visit (HOSPITAL_COMMUNITY): Payer: Self-pay | Admitting: Internal Medicine

## 2016-11-27 DIAGNOSIS — R188 Other ascites: Secondary | ICD-10-CM

## 2016-12-02 ENCOUNTER — Encounter (HOSPITAL_COMMUNITY): Payer: Self-pay

## 2016-12-02 ENCOUNTER — Ambulatory Visit (HOSPITAL_COMMUNITY)
Admission: RE | Admit: 2016-12-02 | Discharge: 2016-12-02 | Disposition: A | Discharge status: Home / Self Care | Payer: Medicare HMO | Source: Ambulatory Visit | Attending: Internal Medicine | Admitting: Internal Medicine

## 2016-12-02 DIAGNOSIS — K7031 Alcoholic cirrhosis of liver with ascites: Secondary | ICD-10-CM | POA: Insufficient documentation

## 2016-12-02 DIAGNOSIS — R188 Other ascites: Secondary | ICD-10-CM

## 2016-12-02 HISTORY — PX: IR PARACENTESIS: IMG2679

## 2016-12-02 MED ORDER — LIDOCAINE HCL 1 % IJ SOLN
INTRAMUSCULAR | Status: DC | PRN
  Administered 2016-12-02: 10 mL

## 2016-12-02 MED ORDER — LIDOCAINE HCL (PF) 1 % IJ SOLN
INTRAMUSCULAR | Status: AC
  Filled 2016-12-02: qty 30

## 2016-12-02 NOTE — Procedures (Signed)
PROCEDURE SUMMARY:  Successful US guided paracentesis from LLQ.  Yielded 4.8 L of hazy, yellow fluid.  No immediate complications.  Pt tolerated well.   Specimen was not sent for labs.  Ascencion Dike PA-C 12/02/2016 10:29 AM

## 2016-12-04 ENCOUNTER — Other Ambulatory Visit (HOSPITAL_COMMUNITY): Payer: Self-pay | Admitting: Internal Medicine

## 2016-12-04 DIAGNOSIS — I10 Essential (primary) hypertension: Secondary | ICD-10-CM | POA: Diagnosis not present

## 2016-12-04 DIAGNOSIS — E1122 Type 2 diabetes mellitus with diabetic chronic kidney disease: Secondary | ICD-10-CM | POA: Diagnosis not present

## 2016-12-04 DIAGNOSIS — Z6833 Body mass index (BMI) 33.0-33.9, adult: Secondary | ICD-10-CM | POA: Diagnosis not present

## 2016-12-04 DIAGNOSIS — K766 Portal hypertension: Secondary | ICD-10-CM | POA: Diagnosis not present

## 2016-12-04 DIAGNOSIS — R600 Localized edema: Secondary | ICD-10-CM | POA: Diagnosis not present

## 2016-12-04 DIAGNOSIS — K7031 Alcoholic cirrhosis of liver with ascites: Secondary | ICD-10-CM

## 2016-12-04 DIAGNOSIS — R188 Other ascites: Secondary | ICD-10-CM | POA: Diagnosis not present

## 2016-12-04 DIAGNOSIS — N183 Chronic kidney disease, stage 3 (moderate): Secondary | ICD-10-CM | POA: Diagnosis not present

## 2016-12-10 ENCOUNTER — Encounter (HOSPITAL_COMMUNITY): Payer: Self-pay | Admitting: Interventional Radiology

## 2016-12-10 ENCOUNTER — Ambulatory Visit (HOSPITAL_COMMUNITY)
Admission: RE | Admit: 2016-12-10 | Discharge: 2016-12-10 | Disposition: A | Discharge status: Home / Self Care | Payer: Medicare HMO | Source: Ambulatory Visit | Attending: Internal Medicine | Admitting: Internal Medicine

## 2016-12-10 DIAGNOSIS — R188 Other ascites: Secondary | ICD-10-CM | POA: Diagnosis not present

## 2016-12-10 DIAGNOSIS — K7031 Alcoholic cirrhosis of liver with ascites: Secondary | ICD-10-CM | POA: Insufficient documentation

## 2016-12-10 HISTORY — PX: IR PARACENTESIS: IMG2679

## 2016-12-10 MED ORDER — LIDOCAINE HCL (PF) 1 % IJ SOLN
INTRAMUSCULAR | Status: AC
  Filled 2016-12-10: qty 30

## 2016-12-10 MED ORDER — LIDOCAINE HCL (PF) 1 % IJ SOLN
INTRAMUSCULAR | Status: DC | PRN
  Administered 2016-12-10: 10 mL

## 2016-12-10 NOTE — Procedures (Signed)
Ultrasound-guided therapeutic paracentesis performed yielding 2.9 liters of serous colored fluid. No immediate complications.  Seleni Meller E 11:30 AM 12/10/2016

## 2016-12-16 DIAGNOSIS — K7460 Unspecified cirrhosis of liver: Secondary | ICD-10-CM | POA: Diagnosis not present

## 2016-12-16 DIAGNOSIS — R188 Other ascites: Secondary | ICD-10-CM | POA: Diagnosis not present

## 2016-12-16 DIAGNOSIS — R600 Localized edema: Secondary | ICD-10-CM | POA: Diagnosis not present

## 2016-12-16 DIAGNOSIS — Z6832 Body mass index (BMI) 32.0-32.9, adult: Secondary | ICD-10-CM | POA: Diagnosis not present

## 2016-12-16 DIAGNOSIS — E871 Hypo-osmolality and hyponatremia: Secondary | ICD-10-CM | POA: Diagnosis not present

## 2016-12-16 DIAGNOSIS — I10 Essential (primary) hypertension: Secondary | ICD-10-CM | POA: Diagnosis not present

## 2016-12-18 ENCOUNTER — Other Ambulatory Visit: Payer: Self-pay | Admitting: Internal Medicine

## 2016-12-18 DIAGNOSIS — R911 Solitary pulmonary nodule: Secondary | ICD-10-CM

## 2016-12-22 DIAGNOSIS — R600 Localized edema: Secondary | ICD-10-CM | POA: Diagnosis not present

## 2016-12-22 DIAGNOSIS — R2681 Unsteadiness on feet: Secondary | ICD-10-CM | POA: Diagnosis not present

## 2016-12-22 DIAGNOSIS — R188 Other ascites: Secondary | ICD-10-CM | POA: Diagnosis not present

## 2016-12-22 DIAGNOSIS — K766 Portal hypertension: Secondary | ICD-10-CM | POA: Diagnosis not present

## 2016-12-22 DIAGNOSIS — K7460 Unspecified cirrhosis of liver: Secondary | ICD-10-CM | POA: Diagnosis not present

## 2016-12-22 DIAGNOSIS — I872 Venous insufficiency (chronic) (peripheral): Secondary | ICD-10-CM | POA: Diagnosis not present

## 2016-12-22 DIAGNOSIS — G629 Polyneuropathy, unspecified: Secondary | ICD-10-CM | POA: Diagnosis not present

## 2016-12-22 DIAGNOSIS — E871 Hypo-osmolality and hyponatremia: Secondary | ICD-10-CM | POA: Diagnosis not present

## 2016-12-22 DIAGNOSIS — E1122 Type 2 diabetes mellitus with diabetic chronic kidney disease: Secondary | ICD-10-CM | POA: Diagnosis not present

## 2016-12-23 ENCOUNTER — Inpatient Hospital Stay
Admission: RE | Admit: 2016-12-23 | Discharge: 2016-12-23 | Disposition: A | Discharge status: Home / Self Care | Payer: Medicare HMO | Source: Ambulatory Visit | Attending: Internal Medicine | Admitting: Internal Medicine

## 2016-12-23 ENCOUNTER — Ambulatory Visit
Admission: RE | Admit: 2016-12-23 | Discharge: 2016-12-23 | Disposition: A | Discharge status: Home / Self Care | Payer: Medicare HMO | Source: Ambulatory Visit | Attending: Internal Medicine | Admitting: Internal Medicine

## 2016-12-23 DIAGNOSIS — R911 Solitary pulmonary nodule: Secondary | ICD-10-CM

## 2016-12-23 DIAGNOSIS — R918 Other nonspecific abnormal finding of lung field: Secondary | ICD-10-CM | POA: Diagnosis not present

## 2016-12-26 ENCOUNTER — Other Ambulatory Visit (HOSPITAL_COMMUNITY): Payer: Self-pay | Admitting: Internal Medicine

## 2016-12-26 DIAGNOSIS — R0602 Shortness of breath: Secondary | ICD-10-CM | POA: Diagnosis not present

## 2016-12-26 DIAGNOSIS — K7460 Unspecified cirrhosis of liver: Secondary | ICD-10-CM | POA: Diagnosis not present

## 2016-12-26 DIAGNOSIS — I4892 Unspecified atrial flutter: Secondary | ICD-10-CM | POA: Diagnosis not present

## 2016-12-26 DIAGNOSIS — R188 Other ascites: Secondary | ICD-10-CM | POA: Diagnosis not present

## 2016-12-26 DIAGNOSIS — E871 Hypo-osmolality and hyponatremia: Secondary | ICD-10-CM | POA: Diagnosis not present

## 2016-12-26 DIAGNOSIS — I872 Venous insufficiency (chronic) (peripheral): Secondary | ICD-10-CM | POA: Diagnosis not present

## 2016-12-26 DIAGNOSIS — I10 Essential (primary) hypertension: Secondary | ICD-10-CM | POA: Diagnosis not present

## 2016-12-26 DIAGNOSIS — Z6832 Body mass index (BMI) 32.0-32.9, adult: Secondary | ICD-10-CM | POA: Diagnosis not present

## 2016-12-29 ENCOUNTER — Encounter (HOSPITAL_COMMUNITY): Payer: Self-pay

## 2016-12-29 ENCOUNTER — Ambulatory Visit (HOSPITAL_COMMUNITY)
Admission: RE | Admit: 2016-12-29 | Discharge: 2016-12-29 | Disposition: A | Discharge status: Home / Self Care | Payer: Medicare HMO | Source: Ambulatory Visit | Attending: Internal Medicine | Admitting: Internal Medicine

## 2016-12-30 ENCOUNTER — Ambulatory Visit (HOSPITAL_COMMUNITY)
Admission: RE | Admit: 2016-12-30 | Discharge: 2016-12-30 | Disposition: A | Discharge status: Home / Self Care | Payer: Medicare HMO | Source: Ambulatory Visit | Attending: Internal Medicine | Admitting: Internal Medicine

## 2016-12-30 ENCOUNTER — Encounter (HOSPITAL_COMMUNITY): Payer: Self-pay | Admitting: General Surgery

## 2016-12-30 DIAGNOSIS — R188 Other ascites: Secondary | ICD-10-CM | POA: Diagnosis not present

## 2016-12-30 DIAGNOSIS — K7031 Alcoholic cirrhosis of liver with ascites: Secondary | ICD-10-CM | POA: Diagnosis not present

## 2016-12-30 HISTORY — PX: IR PARACENTESIS: IMG2679

## 2016-12-30 MED ORDER — LIDOCAINE HCL (PF) 1 % IJ SOLN
INTRAMUSCULAR | Status: DC | PRN
  Administered 2016-12-30: 10 mL

## 2016-12-30 MED ORDER — LIDOCAINE HCL (PF) 1 % IJ SOLN
INTRAMUSCULAR | Status: AC
  Filled 2016-12-30: qty 30

## 2016-12-30 NOTE — Procedures (Signed)
Ultrasound-guided therapeutic paracentesis performed yielding 3.2 liters of yellow, icteric colored fluid. No immediate complications.  [name] E 12:29 PM 12/30/2016

## 2017-01-13 ENCOUNTER — Ambulatory Visit: Payer: Medicare HMO | Admitting: Gastroenterology

## 2017-01-22 ENCOUNTER — Other Ambulatory Visit (HOSPITAL_COMMUNITY): Payer: Self-pay | Admitting: Internal Medicine

## 2017-01-22 DIAGNOSIS — R188 Other ascites: Secondary | ICD-10-CM

## 2017-01-27 ENCOUNTER — Encounter (HOSPITAL_COMMUNITY): Payer: Self-pay | Admitting: General Surgery

## 2017-01-27 ENCOUNTER — Ambulatory Visit (HOSPITAL_COMMUNITY)
Admission: RE | Admit: 2017-01-27 | Discharge: 2017-01-27 | Disposition: A | Discharge status: Home / Self Care | Payer: Medicare HMO | Source: Ambulatory Visit | Attending: Internal Medicine | Admitting: Internal Medicine

## 2017-01-27 DIAGNOSIS — R188 Other ascites: Secondary | ICD-10-CM | POA: Diagnosis not present

## 2017-01-27 DIAGNOSIS — K7031 Alcoholic cirrhosis of liver with ascites: Secondary | ICD-10-CM | POA: Diagnosis not present

## 2017-01-27 HISTORY — PX: IR PARACENTESIS: IMG2679

## 2017-01-27 MED ORDER — LIDOCAINE HCL (PF) 1 % IJ SOLN
INTRAMUSCULAR | Status: DC | PRN
  Administered 2017-01-27: 10 mL

## 2017-01-27 MED ORDER — LIDOCAINE HCL (PF) 1 % IJ SOLN
INTRAMUSCULAR | Status: AC
  Filled 2017-01-27: qty 30

## 2017-01-27 NOTE — Procedures (Signed)
Ultrasound-guided therapeutic paracentesis performed yielding 2 liters of serous colored fluid. No immediate complications.  [name] E 2:26 PM 01/27/2017

## 2017-02-03 ENCOUNTER — Encounter (INDEPENDENT_AMBULATORY_CARE_PROVIDER_SITE_OTHER): Payer: Self-pay

## 2017-02-03 ENCOUNTER — Ambulatory Visit (INDEPENDENT_AMBULATORY_CARE_PROVIDER_SITE_OTHER): Payer: Medicare HMO | Admitting: Gastroenterology

## 2017-02-03 ENCOUNTER — Encounter: Payer: Self-pay | Admitting: Gastroenterology

## 2017-02-03 VITALS — BP 90/52 | HR 79 | Ht 65.0 in | Wt 190.0 lb

## 2017-02-03 DIAGNOSIS — D500 Iron deficiency anemia secondary to blood loss (chronic): Secondary | ICD-10-CM | POA: Diagnosis not present

## 2017-02-03 DIAGNOSIS — K7031 Alcoholic cirrhosis of liver with ascites: Secondary | ICD-10-CM

## 2017-02-03 DIAGNOSIS — K219 Gastro-esophageal reflux disease without esophagitis: Secondary | ICD-10-CM

## 2017-02-03 NOTE — Progress Notes (Signed)
    History of Present Illness: This is a 76 year old male with decompensated cirrhosis with ascites. He is closely followed by Dr. Virgina Jock who is primarily directing his management. I spoke with Dr. Virgina Jock and I will see [name] on a consulting basis every 6 months for now. He has undergone paracenteses approximately once monthly since June. The last 2 paracenteses removed 3.2 L and 2 L respectively. He states he is lost 30 pounds mainly fluid weight. He states he has improved his diet and this decreased his sodium intake. He states he decreased his alcohol intake to one beer every few weeks. He states he feels much better since his weight has dropped.  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 Biopsied. - Portal hypertensive gastropathy. - Small hiatal hernia. 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 Treated with a monopolar probe. - Normal duodenal bulb.  Colonoscopy 07/2016 - Five 6 to 7 mm polyps in the sigmoid colon and in the transverse colon, removed with a cold snare. Resected and retrieved. - Internal hemorrhoids. - Two 5 mm polyps in the descending colon and in the transverse colon, removed with a cold biopsy forceps. Resected and retrieved. 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 There was no evidence of diverticular bleeding. - The examination was otherwise normal on direct and retroflexion views.  Current Medications, Allergies, Past Medical History, Past Surgical History, Family History and Social History were reviewed in [employer] record.  Physical Exam: General: Well developed, well nourished, no acute distress Head: Normocephalic and atraumatic Eyes:  sclerae anicteric, EOMI Ears: Normal auditory acuity Mouth: No deformity or lesions Lungs: Clear throughout to auscultation Heart: Regular rate and rhythm; no murmurs, rubs or bruits Abdomen: Soft,  non tender and moderately distended. No masses, hepatosplenomegaly or hernias noted. Normal Bowel sounds Musculoskeletal: Symmetrical with no gross deformities  Pulses:  Normal pulses noted Extremities: No clubbing. Chronic venous stasis changes bilaterally, 1+ pedal edema bilaterally Neurological: Alert oriented x 4, grossly nonfocal Psychological:  Alert and cooperative. Normal mood and affect  Assessment and Recommendations:  1. 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 Anticipate recurrent iron deficiency anemia and the need for retreatment with iron. Dr. Virgina Jock to monitor CBC at regular intervals.  2. Decompensated cirrhosis with refractory ascites. He reports a 30 pound weight loss which appears to be fluid weight with diuretics, paracenteses, dietary improvements and decreased use of alcohol. Recent sodium 132, potassium 4.1 and normal/stable renal function. Continue current dose of furosemide and spironolactone. Patient is encouraged to limit sodium intake. He is concerned about limiting his oral sodium intake due to his ongoing problems with hyponatremia tried to reassure him that they are not necessarily linked and a lower sodium diet may reduce or even eliminate the need for paracenteses Screen for [facility] with RUQ ultrasound. Dr. Virgina Jock to obtain PT/INR at regular intervals to monitor hepatic synthetic function. 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 REV in 6 months.   3. GERD. Patient is advised to continue omeprazole 40 mg twice daily along with antireflux measures for control of GERD.

## 2017-02-03 NOTE — Patient Instructions (Signed)
Normal BMI (Body Mass Index- based on height and weight) is between 23 and 30. Your BMI today is Body mass index is 31.62 kg/m. [name] Please consider follow up  regarding your BMI with your Primary Care Provider.  Thank you for choosing me and [facility].  Pricilla Riffle. Dagoberto Ligas., MD., Marval Regal

## 2017-02-04 ENCOUNTER — Telehealth: Payer: Self-pay

## 2017-02-04 DIAGNOSIS — K766 Portal hypertension: Secondary | ICD-10-CM | POA: Diagnosis not present

## 2017-02-04 NOTE — Telephone Encounter (Signed)
Informed patient that he needs a repeat ultrasound for [place] screening. Patient states he does not have a working car right now and it is not sure when he can scheduled. Told patient it is very important that he scheduled this ultrasound. Patient states he will call me back tomorrow to schedule.

## 2017-02-04 NOTE — Telephone Encounter (Signed)
-----   Message from Ladene Artist, MD sent at 02/03/2017  5:04 PM EDT ----- Please schedule RUQ [place] for [facility] screening in cirrhosis.

## 2017-02-10 DIAGNOSIS — K7460 Unspecified cirrhosis of liver: Secondary | ICD-10-CM | POA: Diagnosis not present

## 2017-02-24 DIAGNOSIS — F1010 Alcohol abuse, uncomplicated: Secondary | ICD-10-CM | POA: Diagnosis not present

## 2017-02-24 DIAGNOSIS — E668 Other obesity: Secondary | ICD-10-CM | POA: Diagnosis not present

## 2017-02-24 DIAGNOSIS — Z1321 Encounter for screening for nutritional disorder: Secondary | ICD-10-CM | POA: Diagnosis not present

## 2017-02-24 DIAGNOSIS — N183 Chronic kidney disease, stage 3 (moderate): Secondary | ICD-10-CM | POA: Diagnosis not present

## 2017-02-24 DIAGNOSIS — R600 Localized edema: Secondary | ICD-10-CM | POA: Diagnosis not present

## 2017-02-24 DIAGNOSIS — R911 Solitary pulmonary nodule: Secondary | ICD-10-CM | POA: Diagnosis not present

## 2017-02-24 DIAGNOSIS — E1122 Type 2 diabetes mellitus with diabetic chronic kidney disease: Secondary | ICD-10-CM | POA: Diagnosis not present

## 2017-02-24 DIAGNOSIS — R188 Other ascites: Secondary | ICD-10-CM | POA: Diagnosis not present

## 2017-02-24 DIAGNOSIS — R2681 Unsteadiness on feet: Secondary | ICD-10-CM | POA: Diagnosis not present

## 2017-03-23 ENCOUNTER — Other Ambulatory Visit (HOSPITAL_COMMUNITY): Payer: Self-pay | Admitting: Internal Medicine

## 2017-03-23 DIAGNOSIS — R188 Other ascites: Secondary | ICD-10-CM

## 2017-03-26 ENCOUNTER — Other Ambulatory Visit (HOSPITAL_COMMUNITY): Payer: Self-pay | Admitting: Internal Medicine

## 2017-03-26 ENCOUNTER — Ambulatory Visit (HOSPITAL_COMMUNITY)
Admission: RE | Admit: 2017-03-26 | Discharge: 2017-03-26 | Disposition: A | Discharge status: Home / Self Care | Payer: Medicare HMO | Source: Ambulatory Visit | Attending: Internal Medicine | Admitting: Internal Medicine

## 2017-03-26 DIAGNOSIS — R188 Other ascites: Secondary | ICD-10-CM

## 2017-03-26 MED ORDER — LIDOCAINE 2% (20 MG/ML) 5 ML SYRINGE
INTRAMUSCULAR | Status: AC
  Filled 2017-03-26: qty 10

## 2017-03-26 NOTE — Progress Notes (Signed)
Patient presented to department today for possible paracentesis.  Limited US Abdomen performed; results dictated separately.  Only a trace amount of fluid present in the right abdomen.  No procedure performed today.   Brynda Greathouse, MS RD PA-C 2:11 PM

## 2017-04-24 DIAGNOSIS — E1122 Type 2 diabetes mellitus with diabetic chronic kidney disease: Secondary | ICD-10-CM | POA: Diagnosis not present

## 2017-04-24 DIAGNOSIS — E7849 Other hyperlipidemia: Secondary | ICD-10-CM | POA: Diagnosis not present

## 2017-04-24 DIAGNOSIS — Z125 Encounter for screening for malignant neoplasm of prostate: Secondary | ICD-10-CM | POA: Diagnosis not present

## 2017-04-24 DIAGNOSIS — R82998 Other abnormal findings in urine: Secondary | ICD-10-CM | POA: Diagnosis not present

## 2017-04-30 DIAGNOSIS — K7460 Unspecified cirrhosis of liver: Secondary | ICD-10-CM | POA: Diagnosis not present

## 2017-04-30 DIAGNOSIS — K766 Portal hypertension: Secondary | ICD-10-CM | POA: Diagnosis not present

## 2017-04-30 DIAGNOSIS — R188 Other ascites: Secondary | ICD-10-CM | POA: Diagnosis not present

## 2017-04-30 DIAGNOSIS — I700 Atherosclerosis of aorta: Secondary | ICD-10-CM | POA: Diagnosis not present

## 2017-04-30 DIAGNOSIS — N183 Chronic kidney disease, stage 3 (moderate): Secondary | ICD-10-CM | POA: Diagnosis not present

## 2017-04-30 DIAGNOSIS — E871 Hypo-osmolality and hyponatremia: Secondary | ICD-10-CM | POA: Diagnosis not present

## 2017-04-30 DIAGNOSIS — R2681 Unsteadiness on feet: Secondary | ICD-10-CM | POA: Diagnosis not present

## 2017-04-30 DIAGNOSIS — E1122 Type 2 diabetes mellitus with diabetic chronic kidney disease: Secondary | ICD-10-CM | POA: Diagnosis not present

## 2017-04-30 DIAGNOSIS — Z0000 Encounter for general adult medical examination without abnormal findings: Secondary | ICD-10-CM | POA: Diagnosis not present

## 2017-05-22 ENCOUNTER — Other Ambulatory Visit (HOSPITAL_COMMUNITY): Payer: Self-pay | Admitting: Internal Medicine

## 2017-05-22 DIAGNOSIS — R188 Other ascites: Secondary | ICD-10-CM

## 2017-05-26 ENCOUNTER — Ambulatory Visit (HOSPITAL_COMMUNITY)
Admission: RE | Admit: 2017-05-26 | Discharge: 2017-05-26 | Disposition: A | Discharge status: Home / Self Care | Payer: Medicare HMO | Source: Ambulatory Visit | Attending: Internal Medicine | Admitting: Internal Medicine

## 2017-05-26 ENCOUNTER — Other Ambulatory Visit (HOSPITAL_COMMUNITY): Payer: Self-pay | Admitting: Internal Medicine

## 2017-05-26 DIAGNOSIS — K7460 Unspecified cirrhosis of liver: Secondary | ICD-10-CM | POA: Diagnosis not present

## 2017-05-26 DIAGNOSIS — R188 Other ascites: Secondary | ICD-10-CM

## 2017-05-26 MED ORDER — LIDOCAINE 2% (20 MG/ML) 5 ML SYRINGE
INTRAMUSCULAR | Status: AC
  Filled 2017-05-26: qty 10

## 2017-07-16 DIAGNOSIS — E871 Hypo-osmolality and hyponatremia: Secondary | ICD-10-CM | POA: Diagnosis not present

## 2017-08-20 DIAGNOSIS — E1122 Type 2 diabetes mellitus with diabetic chronic kidney disease: Secondary | ICD-10-CM | POA: Diagnosis not present

## 2017-08-20 DIAGNOSIS — R2681 Unsteadiness on feet: Secondary | ICD-10-CM | POA: Diagnosis not present

## 2017-08-20 DIAGNOSIS — R600 Localized edema: Secondary | ICD-10-CM | POA: Diagnosis not present

## 2017-08-20 DIAGNOSIS — I700 Atherosclerosis of aorta: Secondary | ICD-10-CM | POA: Diagnosis not present

## 2017-08-20 DIAGNOSIS — N183 Chronic kidney disease, stage 3 (moderate): Secondary | ICD-10-CM | POA: Diagnosis not present

## 2017-08-20 DIAGNOSIS — E871 Hypo-osmolality and hyponatremia: Secondary | ICD-10-CM | POA: Diagnosis not present

## 2017-08-20 DIAGNOSIS — F1010 Alcohol abuse, uncomplicated: Secondary | ICD-10-CM | POA: Diagnosis not present

## 2017-08-20 DIAGNOSIS — R188 Other ascites: Secondary | ICD-10-CM | POA: Diagnosis not present

## 2017-08-20 DIAGNOSIS — K7460 Unspecified cirrhosis of liver: Secondary | ICD-10-CM | POA: Diagnosis not present

## 2017-09-07 ENCOUNTER — Other Ambulatory Visit (HOSPITAL_COMMUNITY): Payer: Self-pay | Admitting: Internal Medicine

## 2017-09-07 DIAGNOSIS — K7460 Unspecified cirrhosis of liver: Secondary | ICD-10-CM

## 2017-09-09 ENCOUNTER — Ambulatory Visit (HOSPITAL_COMMUNITY)
Admission: RE | Admit: 2017-09-09 | Discharge: 2017-09-09 | Disposition: A | Discharge status: Home / Self Care | Payer: Medicare HMO | Source: Ambulatory Visit | Attending: Internal Medicine | Admitting: Internal Medicine

## 2017-09-09 ENCOUNTER — Encounter (HOSPITAL_COMMUNITY): Payer: Self-pay | Admitting: Student

## 2017-09-09 DIAGNOSIS — K7460 Unspecified cirrhosis of liver: Secondary | ICD-10-CM

## 2017-09-09 DIAGNOSIS — R188 Other ascites: Secondary | ICD-10-CM | POA: Diagnosis not present

## 2017-09-09 DIAGNOSIS — K7031 Alcoholic cirrhosis of liver with ascites: Secondary | ICD-10-CM | POA: Diagnosis not present

## 2017-09-09 HISTORY — PX: IR PARACENTESIS: IMG2679

## 2017-09-09 MED ORDER — LIDOCAINE HCL (PF) 2 % IJ SOLN
INTRAMUSCULAR | Status: AC
  Filled 2017-09-09: qty 20

## 2017-09-09 MED ORDER — LIDOCAINE HCL (PF) 1 % IJ SOLN
INTRAMUSCULAR | Status: DC | PRN
  Administered 2017-09-09: 10 mL

## 2017-09-09 NOTE — Procedures (Signed)
PROCEDURE SUMMARY:  Successful image-guided paracentesis from the left lower abdomen.  Yielded 3.0 liters of clear gold fluid.  No immediate complications.  Patient tolerated well.   Specimen was not sent for labs.  Claris Pong Louk PA-C 09/09/2017 1:35 PM

## 2017-10-02 DIAGNOSIS — Z6829 Body mass index (BMI) 29.0-29.9, adult: Secondary | ICD-10-CM | POA: Diagnosis not present

## 2017-10-02 DIAGNOSIS — S30850A Superficial foreign body of lower back and pelvis, initial encounter: Secondary | ICD-10-CM | POA: Diagnosis not present

## 2017-11-11 DIAGNOSIS — R188 Other ascites: Secondary | ICD-10-CM | POA: Diagnosis not present

## 2017-11-11 DIAGNOSIS — K7031 Alcoholic cirrhosis of liver with ascites: Secondary | ICD-10-CM | POA: Diagnosis not present

## 2017-11-11 DIAGNOSIS — R140 Abdominal distension (gaseous): Secondary | ICD-10-CM | POA: Diagnosis not present

## 2017-11-11 DIAGNOSIS — K7460 Unspecified cirrhosis of liver: Secondary | ICD-10-CM | POA: Diagnosis not present

## 2017-12-15 DIAGNOSIS — K7460 Unspecified cirrhosis of liver: Secondary | ICD-10-CM | POA: Diagnosis not present

## 2017-12-15 DIAGNOSIS — R188 Other ascites: Secondary | ICD-10-CM | POA: Diagnosis not present

## 2017-12-28 DIAGNOSIS — J329 Chronic sinusitis, unspecified: Secondary | ICD-10-CM | POA: Diagnosis not present

## 2017-12-28 DIAGNOSIS — H6693 Otitis media, unspecified, bilateral: Secondary | ICD-10-CM | POA: Diagnosis not present

## 2018-01-12 DIAGNOSIS — K7031 Alcoholic cirrhosis of liver with ascites: Secondary | ICD-10-CM | POA: Diagnosis not present

## 2018-01-21 DIAGNOSIS — R600 Localized edema: Secondary | ICD-10-CM | POA: Diagnosis not present

## 2018-01-21 DIAGNOSIS — I10 Essential (primary) hypertension: Secondary | ICD-10-CM | POA: Diagnosis not present

## 2018-01-21 DIAGNOSIS — I4892 Unspecified atrial flutter: Secondary | ICD-10-CM | POA: Diagnosis not present

## 2018-01-21 DIAGNOSIS — K7460 Unspecified cirrhosis of liver: Secondary | ICD-10-CM | POA: Diagnosis not present

## 2018-01-21 DIAGNOSIS — Z6829 Body mass index (BMI) 29.0-29.9, adult: Secondary | ICD-10-CM | POA: Diagnosis not present

## 2018-01-25 DIAGNOSIS — E668 Other obesity: Secondary | ICD-10-CM | POA: Diagnosis not present

## 2018-01-25 DIAGNOSIS — E871 Hypo-osmolality and hyponatremia: Secondary | ICD-10-CM | POA: Diagnosis not present

## 2018-01-25 DIAGNOSIS — N183 Chronic kidney disease, stage 3 (moderate): Secondary | ICD-10-CM | POA: Diagnosis not present

## 2018-01-25 DIAGNOSIS — F1010 Alcohol abuse, uncomplicated: Secondary | ICD-10-CM | POA: Diagnosis not present

## 2018-01-25 DIAGNOSIS — E1122 Type 2 diabetes mellitus with diabetic chronic kidney disease: Secondary | ICD-10-CM | POA: Diagnosis not present

## 2018-01-25 DIAGNOSIS — I4892 Unspecified atrial flutter: Secondary | ICD-10-CM | POA: Diagnosis not present

## 2018-01-25 DIAGNOSIS — K7460 Unspecified cirrhosis of liver: Secondary | ICD-10-CM | POA: Diagnosis not present

## 2018-01-25 DIAGNOSIS — R188 Other ascites: Secondary | ICD-10-CM | POA: Diagnosis not present

## 2018-01-25 DIAGNOSIS — I10 Essential (primary) hypertension: Secondary | ICD-10-CM | POA: Diagnosis not present

## 2018-03-01 ENCOUNTER — Other Ambulatory Visit (HOSPITAL_COMMUNITY): Payer: Self-pay | Admitting: Internal Medicine

## 2018-03-01 DIAGNOSIS — R188 Other ascites: Secondary | ICD-10-CM

## 2018-03-04 ENCOUNTER — Encounter (HOSPITAL_COMMUNITY): Payer: Self-pay | Admitting: Physician Assistant

## 2018-03-04 ENCOUNTER — Other Ambulatory Visit (HOSPITAL_COMMUNITY): Payer: Self-pay | Admitting: Internal Medicine

## 2018-03-04 ENCOUNTER — Ambulatory Visit (HOSPITAL_COMMUNITY)
Admission: RE | Admit: 2018-03-04 | Discharge: 2018-03-04 | Disposition: A | Discharge status: Home / Self Care | Payer: Medicare HMO | Source: Ambulatory Visit | Attending: Internal Medicine | Admitting: Internal Medicine

## 2018-03-04 DIAGNOSIS — R188 Other ascites: Secondary | ICD-10-CM | POA: Insufficient documentation

## 2018-03-04 DIAGNOSIS — K7031 Alcoholic cirrhosis of liver with ascites: Secondary | ICD-10-CM | POA: Diagnosis not present

## 2018-03-04 HISTORY — PX: IR PARACENTESIS: IMG2679

## 2018-03-04 MED ORDER — LIDOCAINE HCL (PF) 1 % IJ SOLN
INTRAMUSCULAR | Status: DC | PRN
  Administered 2018-03-04: 10 mL

## 2018-03-04 MED ORDER — LIDOCAINE HCL 1 % IJ SOLN
INTRAMUSCULAR | Status: AC
  Filled 2018-03-04: qty 20

## 2018-03-04 NOTE — Procedures (Signed)
PROCEDURE SUMMARY:  Successful US guided paracentesis from left lateral abdomen.  Yielded 4 L of clear yellow fluid.  No immediate complications.  Patient tolerated well.   WENDY S BLAIR PA-C 03/04/2018 2:27 PM

## 2018-04-08 ENCOUNTER — Other Ambulatory Visit (HOSPITAL_COMMUNITY): Payer: Self-pay | Admitting: Internal Medicine

## 2018-04-08 ENCOUNTER — Encounter (HOSPITAL_COMMUNITY): Payer: Self-pay | Admitting: Radiology

## 2018-04-08 ENCOUNTER — Ambulatory Visit (HOSPITAL_COMMUNITY)
Admission: RE | Admit: 2018-04-08 | Discharge: 2018-04-08 | Disposition: A | Discharge status: Home / Self Care | Payer: Medicare HMO | Source: Ambulatory Visit | Attending: Internal Medicine | Admitting: Internal Medicine

## 2018-04-08 DIAGNOSIS — R188 Other ascites: Secondary | ICD-10-CM

## 2018-04-08 DIAGNOSIS — K7031 Alcoholic cirrhosis of liver with ascites: Secondary | ICD-10-CM | POA: Insufficient documentation

## 2018-04-08 HISTORY — PX: IR PARACENTESIS: IMG2679

## 2018-04-08 LAB — GRAM STAIN

## 2018-04-08 LAB — BODY FLUID CELL COUNT WITH DIFFERENTIAL
Lymphs, Fluid: 40 %
Monocyte-Macrophage-Serous Fluid: 57 % (ref 50–90)
Neutrophil Count, Fluid: 3 % (ref 0–25)
Total Nucleated Cell Count, Fluid: 800 uL (ref 0–1000)

## 2018-04-08 MED ORDER — LIDOCAINE HCL (PF) 1 % IJ SOLN
INTRAMUSCULAR | Status: AC | PRN
  Administered 2018-04-08: 10 mL

## 2018-04-08 MED ORDER — LIDOCAINE HCL 1 % IJ SOLN
INTRAMUSCULAR | Status: AC
  Filled 2018-04-08: qty 20

## 2018-04-08 NOTE — Procedures (Addendum)
Ultrasound-guided diagnostic and therapeutic paracentesis performed yielding 3.8  liters of turbid, yellow fluid. No immediate complications. A portion of the fluid was sent to the lab for cell count/diff/culture.

## 2018-04-09 LAB — PATHOLOGIST SMEAR REVIEW

## 2018-04-13 LAB — CULTURE, BODY FLUID-BOTTLE: CULTURE: NO GROWTH

## 2018-04-13 LAB — CULTURE, BODY FLUID W GRAM STAIN -BOTTLE

## 2018-04-19 DIAGNOSIS — W01198A Fall on same level from slipping, tripping and stumbling with subsequent striking against other object, initial encounter: Secondary | ICD-10-CM | POA: Diagnosis not present

## 2018-04-19 DIAGNOSIS — I10 Essential (primary) hypertension: Secondary | ICD-10-CM | POA: Diagnosis not present

## 2018-04-19 DIAGNOSIS — S42002A Fracture of unspecified part of left clavicle, initial encounter for closed fracture: Secondary | ICD-10-CM | POA: Diagnosis not present

## 2018-04-19 DIAGNOSIS — M25552 Pain in left hip: Secondary | ICD-10-CM | POA: Diagnosis not present

## 2018-04-22 DIAGNOSIS — S42025A Nondisplaced fracture of shaft of left clavicle, initial encounter for closed fracture: Secondary | ICD-10-CM | POA: Diagnosis not present

## 2018-04-22 DIAGNOSIS — M25512 Pain in left shoulder: Secondary | ICD-10-CM | POA: Diagnosis not present

## 2018-04-26 DIAGNOSIS — E7849 Other hyperlipidemia: Secondary | ICD-10-CM | POA: Diagnosis not present

## 2018-04-26 DIAGNOSIS — F1010 Alcohol abuse, uncomplicated: Secondary | ICD-10-CM | POA: Diagnosis not present

## 2018-04-26 DIAGNOSIS — Z125 Encounter for screening for malignant neoplasm of prostate: Secondary | ICD-10-CM | POA: Diagnosis not present

## 2018-04-26 DIAGNOSIS — I10 Essential (primary) hypertension: Secondary | ICD-10-CM | POA: Diagnosis not present

## 2018-04-26 DIAGNOSIS — E1122 Type 2 diabetes mellitus with diabetic chronic kidney disease: Secondary | ICD-10-CM | POA: Diagnosis not present

## 2018-04-26 DIAGNOSIS — R82998 Other abnormal findings in urine: Secondary | ICD-10-CM | POA: Diagnosis not present

## 2018-05-03 DIAGNOSIS — F3341 Major depressive disorder, recurrent, in partial remission: Secondary | ICD-10-CM | POA: Diagnosis not present

## 2018-05-03 DIAGNOSIS — K7460 Unspecified cirrhosis of liver: Secondary | ICD-10-CM | POA: Diagnosis not present

## 2018-05-03 DIAGNOSIS — K766 Portal hypertension: Secondary | ICD-10-CM | POA: Diagnosis not present

## 2018-05-03 DIAGNOSIS — I10 Essential (primary) hypertension: Secondary | ICD-10-CM | POA: Diagnosis not present

## 2018-05-03 DIAGNOSIS — K7031 Alcoholic cirrhosis of liver with ascites: Secondary | ICD-10-CM | POA: Diagnosis not present

## 2018-05-03 DIAGNOSIS — J438 Other emphysema: Secondary | ICD-10-CM | POA: Diagnosis not present

## 2018-05-03 DIAGNOSIS — N183 Chronic kidney disease, stage 3 (moderate): Secondary | ICD-10-CM | POA: Diagnosis not present

## 2018-05-03 DIAGNOSIS — E1122 Type 2 diabetes mellitus with diabetic chronic kidney disease: Secondary | ICD-10-CM | POA: Diagnosis not present

## 2018-05-03 DIAGNOSIS — Z0000 Encounter for general adult medical examination without abnormal findings: Secondary | ICD-10-CM | POA: Diagnosis not present

## 2018-05-03 DIAGNOSIS — F1010 Alcohol abuse, uncomplicated: Secondary | ICD-10-CM | POA: Diagnosis not present

## 2018-05-03 DIAGNOSIS — R911 Solitary pulmonary nodule: Secondary | ICD-10-CM | POA: Diagnosis not present

## 2018-05-03 DIAGNOSIS — J432 Centrilobular emphysema: Secondary | ICD-10-CM | POA: Diagnosis not present

## 2018-05-04 ENCOUNTER — Other Ambulatory Visit (HOSPITAL_COMMUNITY): Payer: Self-pay | Admitting: Internal Medicine

## 2018-05-04 DIAGNOSIS — R188 Other ascites: Secondary | ICD-10-CM

## 2018-05-10 ENCOUNTER — Other Ambulatory Visit: Payer: Self-pay | Admitting: Internal Medicine

## 2018-05-10 ENCOUNTER — Ambulatory Visit (HOSPITAL_COMMUNITY)
Admission: RE | Admit: 2018-05-10 | Discharge: 2018-05-10 | Disposition: A | Discharge status: Home / Self Care | Payer: Medicare HMO | Source: Ambulatory Visit | Attending: Internal Medicine | Admitting: Internal Medicine

## 2018-05-10 ENCOUNTER — Encounter (HOSPITAL_COMMUNITY): Payer: Self-pay | Admitting: Radiology

## 2018-05-10 DIAGNOSIS — Z1212 Encounter for screening for malignant neoplasm of rectum: Secondary | ICD-10-CM | POA: Diagnosis not present

## 2018-05-10 DIAGNOSIS — R188 Other ascites: Secondary | ICD-10-CM

## 2018-05-10 DIAGNOSIS — R911 Solitary pulmonary nodule: Secondary | ICD-10-CM

## 2018-05-10 HISTORY — PX: IR PARACENTESIS: IMG2679

## 2018-05-10 MED ORDER — LIDOCAINE HCL 1 % IJ SOLN
INTRAMUSCULAR | Status: AC
  Filled 2018-05-10: qty 20

## 2018-05-10 MED ORDER — LIDOCAINE HCL (PF) 1 % IJ SOLN
INTRAMUSCULAR | Status: DC | PRN
  Administered 2018-05-10: 10 mL

## 2018-05-10 NOTE — Procedures (Signed)
   US guided LLQ paracentesis  3.4L cloudy yellow fluid No labs  Tolerated well

## 2018-05-18 ENCOUNTER — Ambulatory Visit
Admission: RE | Admit: 2018-05-18 | Discharge: 2018-05-18 | Disposition: A | Discharge status: Home / Self Care | Payer: Medicare HMO | Source: Ambulatory Visit | Attending: Internal Medicine | Admitting: Internal Medicine

## 2018-05-18 DIAGNOSIS — R911 Solitary pulmonary nodule: Secondary | ICD-10-CM

## 2018-05-18 DIAGNOSIS — R918 Other nonspecific abnormal finding of lung field: Secondary | ICD-10-CM | POA: Diagnosis not present

## 2018-05-21 ENCOUNTER — Ambulatory Visit (HOSPITAL_COMMUNITY)
Admission: RE | Admit: 2018-05-21 | Discharge: 2018-05-21 | Disposition: A | Discharge status: Home / Self Care | Payer: Medicare HMO | Source: Ambulatory Visit | Attending: Internal Medicine | Admitting: Internal Medicine

## 2018-05-21 ENCOUNTER — Encounter (HOSPITAL_COMMUNITY): Payer: Self-pay | Admitting: Student

## 2018-05-21 DIAGNOSIS — K7031 Alcoholic cirrhosis of liver with ascites: Secondary | ICD-10-CM | POA: Diagnosis not present

## 2018-05-21 DIAGNOSIS — R188 Other ascites: Secondary | ICD-10-CM

## 2018-05-21 HISTORY — PX: IR PARACENTESIS: IMG2679

## 2018-05-21 MED ORDER — LIDOCAINE HCL 1 % IJ SOLN
INTRAMUSCULAR | Status: AC
  Filled 2018-05-21: qty 20

## 2018-05-21 NOTE — Procedures (Signed)
PROCEDURE SUMMARY:  Successful image-guided paracentesis from the left lower abdomen.  Yielded 4.2 liters of hazy yellow fluid.  No immediate complications.  Patient tolerated well.   Specimen was not sent for labs.  Claris Pong Louk PA-C 05/21/2018 1:48 PM

## 2018-06-07 ENCOUNTER — Encounter (HOSPITAL_BASED_OUTPATIENT_CLINIC_OR_DEPARTMENT_OTHER): Payer: Medicare HMO | Attending: Internal Medicine

## 2018-06-07 DIAGNOSIS — E1140 Type 2 diabetes mellitus with diabetic neuropathy, unspecified: Secondary | ICD-10-CM | POA: Diagnosis not present

## 2018-06-07 DIAGNOSIS — L97821 Non-pressure chronic ulcer of other part of left lower leg limited to breakdown of skin: Secondary | ICD-10-CM | POA: Diagnosis not present

## 2018-06-07 DIAGNOSIS — L97229 Non-pressure chronic ulcer of left calf with unspecified severity: Secondary | ICD-10-CM | POA: Insufficient documentation

## 2018-06-07 DIAGNOSIS — I87332 Chronic venous hypertension (idiopathic) with ulcer and inflammation of left lower extremity: Secondary | ICD-10-CM | POA: Diagnosis not present

## 2018-06-07 DIAGNOSIS — L97829 Non-pressure chronic ulcer of other part of left lower leg with unspecified severity: Secondary | ICD-10-CM | POA: Diagnosis not present

## 2018-06-07 DIAGNOSIS — I10 Essential (primary) hypertension: Secondary | ICD-10-CM | POA: Diagnosis not present

## 2018-06-07 DIAGNOSIS — I87312 Chronic venous hypertension (idiopathic) with ulcer of left lower extremity: Secondary | ICD-10-CM | POA: Diagnosis not present

## 2018-06-07 DIAGNOSIS — Z7984 Long term (current) use of oral hypoglycemic drugs: Secondary | ICD-10-CM | POA: Insufficient documentation

## 2018-06-07 DIAGNOSIS — E11622 Type 2 diabetes mellitus with other skin ulcer: Secondary | ICD-10-CM | POA: Diagnosis not present

## 2018-06-10 ENCOUNTER — Other Ambulatory Visit: Payer: Self-pay | Admitting: *Deleted

## 2018-06-10 ENCOUNTER — Other Ambulatory Visit: Payer: Self-pay

## 2018-06-10 ENCOUNTER — Institutional Professional Consult (permissible substitution): Payer: Medicare HMO | Admitting: Thoracic Surgery (Cardiothoracic Vascular Surgery)

## 2018-06-10 ENCOUNTER — Encounter: Payer: Self-pay | Admitting: Thoracic Surgery (Cardiothoracic Vascular Surgery)

## 2018-06-10 VITALS — BP 117/71 | HR 63 | Resp 18 | Ht 66.0 in | Wt 200.0 lb

## 2018-06-10 DIAGNOSIS — J90 Pleural effusion, not elsewhere classified: Secondary | ICD-10-CM

## 2018-06-10 DIAGNOSIS — R918 Other nonspecific abnormal finding of lung field: Secondary | ICD-10-CM

## 2018-06-10 NOTE — Progress Notes (Signed)
PCP is Shon Baton, MD Referring Provider is Shon Baton, MD  Chief Complaint  Patient presents with  . Lung Lesion    LUL/RLL per CT CHEST 05/18/18  . Pleural Effusion    BILATERAL    HPI: Mr. Tengan is sent for consultation regarding bilateral lung nodules.  Rahil Passey is a 78 year old man with a complicated medical history including alcoholism, decompensated cirrhosis requiring monthly paracentesis, PTSD, atrial fibrillation and flutter status post ablation, type 2 diabetes without complication, hyperlipidemia, hypertension, chronic peripheral edema, reflux, and stage III chronic kidney disease.  He is a lifelong non-smoker although he did smoke a few cigarettes when he was younger.  He was found to have bilateral lung nodules and bilateral pleural effusions on a CT of the chest that was done back in February 2018.  He was being evaluated for shortness of breath time.  He also had calcification of his pericardium, aortic atherosclerosis, and three-vessel coronary calcification.  He had a follow-up CT 6 months later in August.  The nodules were essentially unchanged at that time.  A repeat CT was done 05/18/2018 which showed the nodules were generally unchanged in size, but there was a more prominent central solid component in the right lower lobe than there had been previously.  His pleural effusions were essentially unchanged.  He did have significant ascites.   His activities are relatively limited.  He has been having monthly paracentesis with drainage of to 4 L at a time.  He does have some cough and wheezing.  Weight fluctuates with ascites.  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  Zubrod Score: At the time of surgery this patient's most appropriate activity status/level should be described as: []     0    Normal activity, no symptoms []     1    Restricted in physical strenuous activity but ambulatory, able  to do out light work [x]     2    Ambulatory and capable of self care, unable to do work activities, up and about >50 % of waking hours                              []     3    Only limited self care, in bed greater than 50% of waking hours []     4    Completely disabled, no self care, confined to bed or chair []     5    Moribund  Past Medical History:  Diagnosis Date  . Adenomatous polyps 06/2004  . Alcoholic cirrhosis of liver with ascites ([place])   . Alcoholism ([place])   . Allergy   . Anxiety    PTSD- no meds  . Atrial fibrillation ([place])   . Atrial flutter ([place])   . Chronic kidney disease    "beginnings of kidney failure"- 3-4 years ago  . Diabetes mellitus without complication ([facility])    no meds  . Diverticulosis   . GERD (gastroesophageal reflux disease)   . Hemorrhoids   . Hiatal hernia   . Hx of hernia repair   . Hyperlipidemia   . Hypertension   . Iron deficiency anemia   . Pedal edema   . Pneumonia    06-2016  . Substance abuse ([place])    alcohol use  . Ulcer   . Varicose veins     Past Surgical History:  Procedure Laterality  Date  . ATRIAL ABLATION SURGERY    . COLONOSCOPY    . IR PARACENTESIS  10/28/2016  . IR PARACENTESIS  11/12/2016  . IR PARACENTESIS  12/02/2016  . IR PARACENTESIS  12/10/2016  . IR PARACENTESIS  12/30/2016  . IR PARACENTESIS  01/27/2017  . IR PARACENTESIS  09/09/2017  . IR PARACENTESIS  03/04/2018  . IR PARACENTESIS  04/08/2018  . IR PARACENTESIS  05/10/2018  . IR PARACENTESIS  05/21/2018  . POLYPECTOMY    . TONSILLECTOMY    . UMBILICAL HERNIA REPAIR    . VARICOSE VEIN SURGERY     x4    Family History  Problem Relation Age of Onset  . Colon polyps Brother   . Prostate cancer Brother   . Heart disease Father        rheumatic fever as child  . Stroke Mother   . Colon cancer Neg Hx   . Esophageal cancer Neg Hx   . Rectal cancer Neg Hx   . Stomach cancer Neg Hx     Social History Social History   Tobacco Use  . Smoking status: Former  Smoker    Types: Cigarettes    Last attempt to quit: 01/16/2011    Years since quitting: 7.4  . Smokeless tobacco: Never Used  Substance Use Topics  . Alcohol use: Yes    Alcohol/week: 12.0 - 15.0 standard drinks    Types: 12 - 15 Cans of beer per week    Comment: 4-5 [place] daily  . Drug use: No    Current Outpatient Medications  Medication Sig Dispense Refill  . furosemide (LASIX) 40 MG tablet Take 40 mg by mouth 2 (two) times daily.    . hydrALAZINE (APRESOLINE) 25 MG tablet Take 25 mg by mouth daily as needed (high blood pressure).     Marland Kitchen linaclotide (LINZESS) 145 MCG CAPS capsule Take 145 mcg by mouth daily as needed (constipation).    . metoprolol (TOPROL XL) 50 MG 24 hr tablet Take 50 mg by mouth daily as needed (high blood pressure).     Marland Kitchen omeprazole (PRILOSEC) 20 MG capsule Take 1 capsule (20 mg total) by mouth 2 (two) times daily. (Patient taking differently: Take 40 mg by mouth 2 (two) times daily. ) 60 capsule 5  . Probiotic Product (ALIGN) 4 MG CAPS Take 1 capsule by mouth as needed.     Marland Kitchen spironolactone (ALDACTONE) 100 MG tablet Take 1 tablet (100 mg total) by mouth 2 (two) times daily. 60 tablet 5  . Multiple Vitamin (MULTIVITAMIN WITH MINERALS) TABS Take 1 tablet by mouth at bedtime.     Current Facility-Administered Medications  Medication Dose Route Frequency Provider Last Rate Last Dose  . 0.9 %  sodium chloride infusion  500 mL Intravenous Continuous Ladene Artist, MD        Allergies  Allergen Reactions  . Lisinopril Anaphylaxis    Hyperkalemia, Dizziness   . Lipitor [Atorvastatin]     REACTION: Marked leg fatigue  . Sulfonamide Derivatives     Unsure of reaction    Review of Systems  Constitutional: Negative for activity change and unexpected weight change.  HENT: Negative for trouble swallowing and voice change.   Respiratory: Positive for cough, shortness of breath and wheezing.   Cardiovascular: Positive for leg swelling. Negative for chest pain.   Gastrointestinal: Positive for abdominal distention. Negative for abdominal pain.  Genitourinary: Negative for difficulty urinating and dysuria.  Musculoskeletal: Positive for arthralgias.  Hematological: Negative for  adenopathy. Does not bruise/bleed easily.  Psychiatric/Behavioral: Positive for dysphoric mood. The patient is nervous/anxious.     BP 117/71 (BP Location: Right Arm, Patient Position: Sitting, Cuff Size: Large)   Pulse 63   Resp 18   Ht 5\' 6"  (1.676 m)   Wt 200 lb (90.7 kg)   SpO2 99% Comment: ON RA  BMI 32.28 kg/m  Physical Exam Constitutional:      General: He is not in acute distress. HENT:     Head: Normocephalic and atraumatic.  Eyes:     General: No scleral icterus.    Extraocular Movements: Extraocular movements intact.     Conjunctiva/sclera: Conjunctivae normal.  Neck:     Musculoskeletal: Neck supple.  Cardiovascular:     Rate and Rhythm: Normal rate and regular rhythm.     Heart sounds: No murmur. No friction rub. No gallop.   Pulmonary:     Effort: Pulmonary effort is normal. No respiratory distress.     Breath sounds: No wheezing or rales.     Comments: Diminished breath sounds both bases Abdominal:     General: There is distension (Massive ascites).  Musculoskeletal:        General: Swelling present.  Lymphadenopathy:     Cervical: No cervical adenopathy.  Neurological:     General: No focal deficit present.     Mental Status: He is alert and oriented to person, place, and time.     Cranial Nerves: No cranial nerve deficit.     Motor: No weakness.    Diagnostic Tests: CT CHEST WITHOUT CONTRAST  TECHNIQUE: Multidetector CT imaging of the chest was performed following the standard protocol without IV contrast.  COMPARISON:  12/23/2016  FINDINGS: Cardiovascular: The heart size is enlarged. Pericardial calcifications are again identified. No pericardial effusion. Aortic atherosclerosis. Three vessel coronary artery  atherosclerotic calcifications noted.  Mediastinum/Nodes: Normal appearance of the thyroid gland. The trachea appears patent and is midline. No mediastinal or hilar adenopathy. Calcified subcarinal lymph node is again identified.  Lungs/Pleura: Moderate volume bilateral pleural effusions identified. Chronic subsegmental atelectasis noted within the lingula. There is a part solid nodule within the left upper lobe which measures 1.1 cm in maximum diameter with a central solid component measuring 0.9 cm, best seen on image 51/5 and image 71/3. On the previous examination this nodule measured 11 mm with a 0.9 cm central solid component.  Part solid nodule within the right lower lobe measures 1.3 cm, image 99/5. On the previous exam this measured 1.2 cm. The central solid component as seen on the axial images measures 0.9 cm currently. Previously 0.6 cm. No new pulmonary nodules identified.  Upper Abdomen: Diffuse upper abdominal ascites noted. Morphologic features of the liver compatible with cirrhosis.  Musculoskeletal: No aggressive lytic or sclerotic bone lesions.  IMPRESSION: 1. Persistent left upper lobe and right lower lobe part solid nodules. Although the maximum diameter of these lesions are not significantly changed in the interval there is continued mild interval progression of the central solid component associated with these nodules. Adenocarcinoma is considered. Thoracic surgery consultation is recommended. These recommendations are taken from: Recommendations for the Management of Subsolid Pulmonary Nodules Detected at CT: A Statement from the [facility] 2013; 266:1, 304-317.  2. Cirrhosis and abdominal ascites. 3. Persistent bilateral pleural effusions 4. Chronic pericardial calcifications, aortic atherosclerosis and multi vessel coronary artery atherosclerotic calcifications. 5.  Aortic Atherosclerosis (ICD10-I70.0).   Electronically  Signed   By: Kerby Moors M.D.   On:  05/18/2018 15:55 I personally reviewed the CT images and concur with the findings noted above.  Impression: Wendelin Bradt is a 78 year old man with no significant smoking history, but with a complicated past medical history including alcoholism, decompensated cirrhosis requiring monthly paracentesis, PTSD, atrial fibrillation and flutter status post ablation, type 2 diabetes without complication, hyperlipidemia, hypertension, chronic peripheral edema, reflux, and stage III chronic kidney disease.   He was found to have bilateral mixed solid/sub-solid lung nodules on CT of the chest done in February 2018.  6 months later the nodules were essentially unchanged.  Now almost 2 years after the original scan the nodules are possibly minimally larger and there has also been a possible increase in the amount of central solid component, particularly in the right lower lobe lung nodule.  Both these nodules are concerning for potential low-grade adenocarcinoma.  Infectious and inflammatory nodules are also in the differential and may in fact be more likely in his case.  I discussed potential options with Mr. Misner including bronchoscopic biopsy, continued CT follow-up, or PET CT to get a more information about the nodules and help guide our initial diagnostic work-up.  After discussing the pros and cons of each approach he is agreeable to proceeding with PET/CT.  He is not an operative candidate but could potentially be a candidate for stereotactic radiation.  Cirrhosis and ascites- He has an appointment with Dr. Fuller Plan next week to discuss possible placement of a catheter to prevent the need for repeated paracenteses.  Bilateral pleural effusions-have been stable over time  Coronary and thoracic aortic atherosclerotic disease-not a candidate for statins due to liver disease.  Plan: 1.We will have CT from 05/18/2018 converted to super dimension protocol for possible  navigational bronchoscopy. 2.  PET/CT to guide initial diagnostic work-up of bilateral lung nodules  [name], MD [facility] [phone]

## 2018-06-11 ENCOUNTER — Other Ambulatory Visit (HOSPITAL_COMMUNITY): Payer: Self-pay | Admitting: Internal Medicine

## 2018-06-11 ENCOUNTER — Encounter: Payer: Self-pay | Admitting: Internal Medicine

## 2018-06-11 DIAGNOSIS — K7460 Unspecified cirrhosis of liver: Secondary | ICD-10-CM

## 2018-06-11 DIAGNOSIS — R188 Other ascites: Secondary | ICD-10-CM

## 2018-06-14 DIAGNOSIS — I87312 Chronic venous hypertension (idiopathic) with ulcer of left lower extremity: Secondary | ICD-10-CM | POA: Diagnosis not present

## 2018-06-14 DIAGNOSIS — I87332 Chronic venous hypertension (idiopathic) with ulcer and inflammation of left lower extremity: Secondary | ICD-10-CM | POA: Diagnosis not present

## 2018-06-14 DIAGNOSIS — I10 Essential (primary) hypertension: Secondary | ICD-10-CM | POA: Diagnosis not present

## 2018-06-14 DIAGNOSIS — E119 Type 2 diabetes mellitus without complications: Secondary | ICD-10-CM | POA: Diagnosis not present

## 2018-06-14 DIAGNOSIS — Z7984 Long term (current) use of oral hypoglycemic drugs: Secondary | ICD-10-CM | POA: Diagnosis not present

## 2018-06-14 DIAGNOSIS — L97229 Non-pressure chronic ulcer of left calf with unspecified severity: Secondary | ICD-10-CM | POA: Diagnosis not present

## 2018-06-14 DIAGNOSIS — L97829 Non-pressure chronic ulcer of other part of left lower leg with unspecified severity: Secondary | ICD-10-CM | POA: Diagnosis not present

## 2018-06-14 DIAGNOSIS — L97821 Non-pressure chronic ulcer of other part of left lower leg limited to breakdown of skin: Secondary | ICD-10-CM | POA: Diagnosis not present

## 2018-06-14 DIAGNOSIS — E1140 Type 2 diabetes mellitus with diabetic neuropathy, unspecified: Secondary | ICD-10-CM | POA: Diagnosis not present

## 2018-06-15 ENCOUNTER — Other Ambulatory Visit: Payer: Self-pay | Admitting: Gastroenterology

## 2018-06-15 ENCOUNTER — Encounter: Payer: Self-pay | Admitting: Gastroenterology

## 2018-06-15 ENCOUNTER — Ambulatory Visit: Payer: Medicare HMO | Admitting: Gastroenterology

## 2018-06-15 ENCOUNTER — Telehealth: Payer: Self-pay | Admitting: Gastroenterology

## 2018-06-15 VITALS — BP 106/64 | HR 72 | Ht 66.0 in | Wt 205.1 lb

## 2018-06-15 DIAGNOSIS — K7031 Alcoholic cirrhosis of liver with ascites: Secondary | ICD-10-CM

## 2018-06-15 DIAGNOSIS — K766 Portal hypertension: Secondary | ICD-10-CM | POA: Diagnosis not present

## 2018-06-15 DIAGNOSIS — K5904 Chronic idiopathic constipation: Secondary | ICD-10-CM

## 2018-06-15 DIAGNOSIS — K3189 Other diseases of stomach and duodenum: Secondary | ICD-10-CM

## 2018-06-15 MED ORDER — FUROSEMIDE 40 MG PO TABS: ORAL_TABLET | ORAL | 1 refills | Status: DC

## 2018-06-15 MED ORDER — SPIRONOLACTONE 100 MG PO TABS: ORAL_TABLET | ORAL | 1 refills | Status: DC

## 2018-06-15 NOTE — Progress Notes (Addendum)
    History of Present Illness: This is a 78 year male with decompensated cirrhosis with refractory ascites now on a monthly schedule for paracentesis.  He has been followed closely by and managed by Dr. Virgina Jock.  His last office visit with me was in September 2018.  I reviewed office notes and blood work sent from Dr. Keane Police office.  CMP from April 26, 2018 showed a Cr= 1.3, BUN=28,  Na=130, t bilirubin=2.1, glucose=123, otherwise normal.  CBC remarkable for WBC= 3.5, Hb=13.1 and platelets=144k. He states his diuretic doses have been stable for a while.  He states he drinks a minimal amount of alcohol and tries to follow a low-sodium diet.  He is frustrated with frequent paracenteses and wishes to discuss other options.  Current Medications, Allergies, Past Medical History, Past Surgical History, Family History and Social History were reviewed in [employer] record.  Physical Exam: General: Well developed, well nourished, no acute distress Head: Normocephalic and atraumatic Eyes:  sclerae anicteric, EOMI Ears: Normal auditory acuity Mouth: No deformity or lesions Lungs: Clear throughout to auscultation Heart: Regular rate and rhythm; no murmurs, rubs or bruits Abdomen: Soft, non tender and distended, with ascites. No masses, hepatosplenomegaly or hernias noted. Normal Bowel sounds Rectal: Not done Musculoskeletal: Symmetrical with no gross deformities  Pulses:  Normal pulses noted Extremities: No clubbing, cyanosis, edema or deformities noted Neurological: Alert oriented x 4, grossly nonfocal Psychological:  Alert and cooperative. Normal mood and affect   Assessment and Recommendations:  1. Decompensated cirrhosis with refractory ascites.  He is interested in the possibility of a chronic indwelling peritoneal catheter instead of frequent paracenteses.  We discussed the risks and benefits.  We discussed TIPS with its risks and benefits. He is advised to further  discuss the indwelling catheter and TIPS options with IR at his paracentesis tomorrow. For now he prefers a less invasive approach which is maximizing diuretic therapy and continuing intermittent paracenteses. Increase spironolactone to 200 mg po qam, 100 mg po qpm. Increase Lasix 80 mg po qam, 40 mg po qpm. BMET in 1 week.  He is advised to discontinue all alcohol usage.  He is advised to closely follow-up 2g Na diet.  Schedule HCC screening with [facility] [place].  After we have readjust his diuretic therapy he will return to Dr. Virgina Jock for ongoing care.   2. 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 Recent Hb=13.1.  Colonoscopy and EGD performed in March 2018.  No plans to repeat at this time.  Follow-up with Dr. Virgina Jock.  3. Constipation.  Continue Linzess 145 mcg daily as needed.   4. GERD.  Continue omeprazole 40 mg bid.  Follow antireflux measures.

## 2018-06-15 NOTE — Telephone Encounter (Signed)
Pt saw Dr. Fuller Plan this morning, he states that he increased his Spironolactone dose in the morning. He wants to know if there is a specific reason why he wants him to take it in the morning.

## 2018-06-15 NOTE — Telephone Encounter (Signed)
I explained that he should take the higher dose in the am to help improve his rest at night. All questions answered.  He will call back for additional questions or concerns.

## 2018-06-15 NOTE — Patient Instructions (Signed)
Please increase your Aldactone to 2 tablets (200 mg) by mouth every morning and 1 tablet (100 mg) in the evening. Also increase your Lasix to 2 tablets (80 mg) in the morning and 1 tablet (40 mg) by mouth in the evening.   Please come back for a Bmet to our basement in 7 days (around 06/22/18).   You have been scheduled for an abdominal ultrasound at [facility] (1st floor of hospital) on 06/16/18 at 11:00am. Please arrive 15 minutes prior to your appointment for registration. Make certain not to have anything to eat or drink after midnight the night before. Should you need to reschedule your appointment, please contact radiology at [phone]. This test typically takes about 30 minutes to perform.  Thank you for choosing me and [facility].  Pricilla Riffle. Dagoberto Ligas., MD., [facility] Regal

## 2018-06-16 ENCOUNTER — Ambulatory Visit (HOSPITAL_COMMUNITY)
Admission: RE | Admit: 2018-06-16 | Discharge: 2018-06-16 | Disposition: A | Discharge status: Home / Self Care | Payer: Medicare HMO | Source: Ambulatory Visit | Attending: Internal Medicine | Admitting: Internal Medicine

## 2018-06-16 ENCOUNTER — Ambulatory Visit (HOSPITAL_COMMUNITY)
Admission: RE | Admit: 2018-06-16 | Discharge: 2018-06-16 | Disposition: A | Discharge status: Home / Self Care | Payer: Medicare HMO | Source: Ambulatory Visit | Attending: Gastroenterology | Admitting: Gastroenterology

## 2018-06-16 ENCOUNTER — Encounter (HOSPITAL_COMMUNITY): Payer: Self-pay | Admitting: Radiology

## 2018-06-16 DIAGNOSIS — K7460 Unspecified cirrhosis of liver: Secondary | ICD-10-CM | POA: Diagnosis not present

## 2018-06-16 DIAGNOSIS — K7031 Alcoholic cirrhosis of liver with ascites: Secondary | ICD-10-CM

## 2018-06-16 DIAGNOSIS — K766 Portal hypertension: Secondary | ICD-10-CM

## 2018-06-16 DIAGNOSIS — R188 Other ascites: Secondary | ICD-10-CM | POA: Diagnosis not present

## 2018-06-16 DIAGNOSIS — K3189 Other diseases of stomach and duodenum: Secondary | ICD-10-CM

## 2018-06-16 HISTORY — PX: IR PARACENTESIS: IMG2679

## 2018-06-16 MED ORDER — LIDOCAINE HCL 1 % IJ SOLN
INTRAMUSCULAR | Status: AC
  Filled 2018-06-16: qty 20

## 2018-06-16 NOTE — Procedures (Signed)
Ultrasound-guided therapeutic paracentesis performed yielding 4.2 liters of hazy, yellow fluid. No immediate complications. EBL none.

## 2018-06-18 ENCOUNTER — Telehealth: Payer: Self-pay | Admitting: Gastroenterology

## 2018-06-18 NOTE — Telephone Encounter (Signed)
Patient wanted clarification with potassium diet. Informed patient that Dr. Fuller Plan did not mention his potassium in his office note but we are repeating his labs in one week. Informed patient to continue 2 g sodium diet and diuretics outlined at last office visit. Patient verbalized understanding.

## 2018-06-18 NOTE — Telephone Encounter (Signed)
Left a message for patient to return my call. 

## 2018-06-21 ENCOUNTER — Encounter (HOSPITAL_BASED_OUTPATIENT_CLINIC_OR_DEPARTMENT_OTHER): Payer: Medicare HMO | Attending: Internal Medicine

## 2018-06-21 DIAGNOSIS — I10 Essential (primary) hypertension: Secondary | ICD-10-CM | POA: Insufficient documentation

## 2018-06-21 DIAGNOSIS — L97821 Non-pressure chronic ulcer of other part of left lower leg limited to breakdown of skin: Secondary | ICD-10-CM | POA: Insufficient documentation

## 2018-06-21 DIAGNOSIS — I87332 Chronic venous hypertension (idiopathic) with ulcer and inflammation of left lower extremity: Secondary | ICD-10-CM | POA: Insufficient documentation

## 2018-06-21 DIAGNOSIS — E1140 Type 2 diabetes mellitus with diabetic neuropathy, unspecified: Secondary | ICD-10-CM | POA: Insufficient documentation

## 2018-06-21 DIAGNOSIS — E11622 Type 2 diabetes mellitus with other skin ulcer: Secondary | ICD-10-CM | POA: Insufficient documentation

## 2018-06-21 DIAGNOSIS — I87312 Chronic venous hypertension (idiopathic) with ulcer of left lower extremity: Secondary | ICD-10-CM | POA: Diagnosis not present

## 2018-06-21 DIAGNOSIS — L97829 Non-pressure chronic ulcer of other part of left lower leg with unspecified severity: Secondary | ICD-10-CM | POA: Diagnosis not present

## 2018-06-21 DIAGNOSIS — K7460 Unspecified cirrhosis of liver: Secondary | ICD-10-CM | POA: Insufficient documentation

## 2018-06-21 DIAGNOSIS — L97229 Non-pressure chronic ulcer of left calf with unspecified severity: Secondary | ICD-10-CM | POA: Diagnosis not present

## 2018-06-22 ENCOUNTER — Other Ambulatory Visit (INDEPENDENT_AMBULATORY_CARE_PROVIDER_SITE_OTHER): Payer: Medicare HMO

## 2018-06-22 ENCOUNTER — Ambulatory Visit
Admission: RE | Admit: 2018-06-22 | Discharge: 2018-06-22 | Disposition: A | Discharge status: Home / Self Care | Payer: Medicare HMO | Source: Ambulatory Visit | Attending: Gastroenterology | Admitting: Gastroenterology

## 2018-06-22 ENCOUNTER — Encounter: Payer: Self-pay | Admitting: Radiology

## 2018-06-22 DIAGNOSIS — K7031 Alcoholic cirrhosis of liver with ascites: Secondary | ICD-10-CM

## 2018-06-22 DIAGNOSIS — K3189 Other diseases of stomach and duodenum: Secondary | ICD-10-CM | POA: Diagnosis not present

## 2018-06-22 DIAGNOSIS — K766 Portal hypertension: Secondary | ICD-10-CM | POA: Diagnosis not present

## 2018-06-22 HISTORY — PX: IR RADIOLOGIST EVAL & MGMT: IMG5224

## 2018-06-22 LAB — BASIC METABOLIC PANEL
BUN: 27 mg/dL — ABNORMAL HIGH (ref 6–23)
CHLORIDE: 88 meq/L — AB (ref 96–112)
CO2: 30 mEq/L (ref 19–32)
CREATININE: 1.26 mg/dL (ref 0.40–1.50)
Calcium: 9.3 mg/dL (ref 8.4–10.5)
GFR: 55.78 mL/min — ABNORMAL LOW (ref >60.00)
Glucose, Bld: 123 mg/dL — ABNORMAL HIGH (ref 70–99)
Potassium: 4.3 mEq/L (ref 3.5–5.1)
Sodium: 127 mEq/L — ABNORMAL LOW (ref 135–145)

## 2018-06-22 NOTE — Consult Note (Signed)
Chief Complaint  Patient presents with  . Consult    Consult for TIPS     Referring Physician(s): Stark,Malcolm T  History of Present Illness: [name] is a 78 y.o. male with past medical history significant for PTSD ([place] veteran), hypertension, hyperlipidemia, chronic kidney disease, atrial fibrillation (not currently on anticoagulation), diabetes and alcoholic cirrhosis who presents today to the interventional radiology clinic for evaluation for potential TIPS creation secondary to recurrent symptomatic ascites.  The patient is unaccompanied and serves as his own historian.  Patient has required monthly paracenteses for the past 4 months, most recently on 05/21/2018 yielding 4.2 L of peritoneal fluid.  Prior to October of last year, the patient underwent paracenteses every approximately 2 to 3 months.  Patient denies hematemesis bloody or melanotic stools.  No yellowing of the skin or eyes.  Patient denies any recent change in mental status.  The patient is interested in pursuing all potential therapeutic options, though he would be happy if paracentesis would only be required every 2 or 3 months, as opposed to every month.  Of note, patient's diuretic medication has recently been adjusted and he is hopeful this medication adjustment will result in increased time interval between required paracenteses.   Past Medical History:  Diagnosis Date  . Adenomatous polyps 06/2004  . Alcoholic cirrhosis of liver with ascites ([place])   . Alcoholism ([place])   . Allergy   . Anxiety    PTSD- no meds  . Atrial fibrillation ([place])   . Atrial flutter ([place])   . Chronic kidney disease    "beginnings of kidney failure"- 3-4 years ago  . Diabetes mellitus without complication (HCC)    no meds  . Diverticulosis   . GERD (gastroesophageal reflux disease)   . Hemorrhoids   . Hiatal hernia   . Hx of hernia repair   . Hyperlipidemia   . Hypertension   . Iron deficiency anemia   . Pedal  edema   . Pneumonia    06-2016  . Substance abuse ([place])    alcohol use  . Ulcer   . Varicose veins     Past Surgical History:  Procedure Laterality Date  . ATRIAL ABLATION SURGERY    . COLONOSCOPY    . IR PARACENTESIS  10/28/2016  . IR PARACENTESIS  11/12/2016  . IR PARACENTESIS  12/02/2016  . IR PARACENTESIS  12/10/2016  . IR PARACENTESIS  12/30/2016  . IR PARACENTESIS  01/27/2017  . IR PARACENTESIS  09/09/2017  . IR PARACENTESIS  03/04/2018  . IR PARACENTESIS  04/08/2018  . IR PARACENTESIS  05/10/2018  . IR PARACENTESIS  05/21/2018  . IR PARACENTESIS  06/16/2018  . IR RADIOLOGIST EVAL & MGMT  06/22/2018  . POLYPECTOMY    . TONSILLECTOMY    . UMBILICAL HERNIA REPAIR    . VARICOSE VEIN SURGERY     x4    Allergies: Lisinopril; Lipitor [atorvastatin]; and Sulfonamide derivatives  Medications: Prior to Admission medications   Medication Sig Start Date End Date Taking? Authorizing Provider  furosemide (LASIX) 40 MG tablet Take 2 tablets (80 mg) by mouth every morning and one 1 tablet (40 mg) in the evening 06/15/18   Ladene Artist, MD  hydrALAZINE (APRESOLINE) 25 MG tablet Take 25 mg by mouth daily as needed (high blood pressure).  11/01/12   [provider]  linaclotide (LINZESS) 145 MCG CAPS capsule Take 145 mcg by mouth daily as needed (constipation).    [provider]  metoprolol (TOPROL XL) 50 MG 24 hr tablet Take 50 mg by mouth daily as needed (high blood pressure).     [provider]  omeprazole (PRILOSEC) 20 MG capsule Take 1 capsule (20 mg total) by mouth 2 (two) times daily. Patient taking differently: Take 40 mg by mouth 2 (two) times daily.  03/23/13   Ladene Artist, MD  spironolactone (ALDACTONE) 100 MG tablet Take 2 tablets (200 mg) by mouth every morning and 1 tablet (100mg ) by mouth in the evening 06/15/18   Ladene Artist, MD     Family History  Problem Relation Age of Onset  . Colon polyps Brother   . Prostate cancer Brother   .  Heart disease Father        rheumatic fever as child  . Stroke Mother   . Colon cancer Neg Hx   . Esophageal cancer Neg Hx   . Rectal cancer Neg Hx   . Stomach cancer Neg Hx     Social History   Socioeconomic History  . Marital status: Single    Spouse name: Not on file  . Number of children: 5  . Years of education: Not on file  . Highest education level: Not on file  Occupational History  . Occupation: retired    Fish farm manager: RETIRED  Social Needs  . Financial resource strain: Not on file  . Food insecurity:    Worry: Not on file    Inability: Not on file  . Transportation needs:    Medical: Not on file    Non-medical: Not on file  Tobacco Use  . Smoking status: Former Smoker    Types: Cigarettes    Last attempt to quit: 01/16/2011    Years since quitting: 7.4  . Smokeless tobacco: Never Used  Substance and Sexual Activity  . Alcohol use: Yes    Alcohol/week: 12.0 - 15.0 standard drinks    Types: 12 - 15 Cans of beer per week    Comment: 4-5 [place] daily  . Drug use: No  . Sexual activity: Not on file  Lifestyle  . Physical activity:    Days per week: Not on file    Minutes per session: Not on file  . Stress: Not on file  Relationships  . Social connections:    Talks on phone: Not on file    Gets together: Not on file    Attends religious service: Not on file    Active member of club or organization: Not on file    Attends meetings of clubs or organizations: Not on file    Relationship status: Not on file  Other Topics Concern  . Not on file  Social History Narrative  . Not on file    ECOG Status: 1 - Symptomatic but completely ambulatory  Review of Systems: A 12 point ROS discussed and pertinent positives are indicated in the HPI above.  All other systems are negative.  Review of Systems  Vital Signs: BP 126/75   Pulse 78   Temp 98.1 F (36.7 C) (Oral)   Resp 17   Ht 5\' 7"  (1.702 m)   Wt 88 kg   SpO2 97%   BMI 30.38 kg/m   Physical  Exam   Imaging: Ir Radiologist Eval & Mgmt  Result Date: 06/22/2018 Please refer to notes tab for details about interventional procedure. (Op Note)  US Abdomen Limited Ruq  Result Date: 06/16/2018 CLINICAL DATA:  Alcoholic cirrhosis.  [place] screening. EXAM: ULTRASOUND  ABDOMEN LIMITED RIGHT UPPER QUADRANT COMPARISON:  Abdominal ultrasound dated May 22, 2015. FINDINGS: Gallbladder: No gallstones visualized. Mild circumferential bladder wall thickening, likely related to underlying liver disease and contracted state. No sonographic Murphy sign noted by sonographer. Common bile duct: Diameter: 2 mm, normal. Liver: No focal lesion identified. Nodular contour with coarsened parenchymal echogenicity. Portal vein is patent on color Doppler imaging with normal direction of blood flow towards the liver. Right upper quadrant ascites. IMPRESSION: 1. Cirrhosis with ascites.  No focal liver lesion. Electronically Signed   By: Titus Dubin M.D.   On: 06/16/2018 16:29   Ir Paracentesis  Result Date: 06/16/2018 INDICATION: Patient with history of alcoholic cirrhosis, recurrent ascites. Request made for therapeutic paracentesis. EXAM: ULTRASOUND GUIDED THERAPEUTIC PARACENTESIS MEDICATIONS: None COMPLICATIONS: None immediate. PROCEDURE: Informed written consent was obtained from the patient after a discussion of the risks, benefits and alternatives to treatment. A timeout was performed prior to the initiation of the procedure. Initial ultrasound scanning demonstrates a large amount of ascites within the left lower abdominal quadrant. The left lower abdomen was prepped and draped in the usual sterile fashion. 1% lidocaine was used for local anesthesia. Following this, a 19 gauge, 10-cm, Yueh catheter was introduced. An ultrasound image was saved for documentation purposes. The paracentesis was performed. The catheter was removed and a dressing was applied. The patient tolerated the procedure well without immediate post  procedural complication. FINDINGS: A total of approximately 4.2 liters of hazy, yellow fluid was removed. IMPRESSION: Successful ultrasound-guided therapeutic paracentesis yielding 4.2 liters of peritoneal fluid. Read by: Rowe Robert, PA-C Electronically Signed   By: Lucrezia Europe M.D.   On: 06/16/2018 12:57    Labs:  CBC: No results for input(s): WBC, HGB, HCT, PLT in the last 8760 hours.  COAGS: No results for input(s): INR, APTT in the last 8760 hours.  BMP: Recent Labs    06/22/18 1158  NA 127*  K 4.3  CL 88*  CO2 30  GLUCOSE 123*  BUN 27*  CALCIUM 9.3  CREATININE 1.26    LIVER FUNCTION TESTS: No results for input(s): BILITOT, AST, ALT, ALKPHOS, PROT, ALBUMIN in the last 8760 hours.  TUMOR MARKERS: No results for input(s): AFPTM, CEA, CA199, CHROMGRNA in the last 8760 hours.  Assessment and Plan:  CADENCE HASLAM is a 78 y.o. male with past medical history significant for PTSD ([place] veteran), hypertension, hyperlipidemia, chronic kidney disease, atrial fibrillation (not currently on anticoagulation), diabetes and alcoholic cirrhosis who presents today to the interventional radiology clinic for evaluation for potential TIPS creation secondary to recurrent symptomatic ascites.    I explained to the patient that tunneled peritoneal catheter placement is only performed for palliative purposes, typically when the patient is under hospice care as tunneled peritoneal catheter placement could result in further worsening of patient's liver functioning and thus a sooner demise.  I also explained that tunneled peritoneal catheters may become infected and or poorly function with time.  As such, prolonged conversations were held with the patient regarding the benefits and risks (including but not limited to worsening hepatic and/or cardiac function, bleeding, infection and postprocedural encephalopathy) of TIPS creation.  Following this prolonged a detailed conversation, the patient is  interested in undergoing TIPS creation, however based on most recent laboratory values obtained December 9th, calculated sodium meld score is 21 (Na - 130; Bili- 2.1; Cr - 1.3; INR - 1.3 - note, INR obtained in 2012), which makes the patient a marginal candidate for elective TIPS creation.  As such, I will obtain repeat LFTs and INR to better determine the current synthetic function of his liver.  Additionally, patient has not undergone any recent cross-sectional imaging of his abdomen and as such I will obtain a BRTO protocol CT of the abdomen and pelvis to evaluate his hepatic vasculature anatomy for preprocedural planning.  Both the patient and I are hopeful the recent adjustment of his diuretic medication will result in better control of his recurrent ascites and therefore avoid any invasive procedure, however if patient continues to experience recurrent ascites despite optimization of diuretics intervention may be considered.  As such, we will see the patient in repeat consultation in approximately 4 to 6 weeks following the acquisition of above laboratories and examinations as well as to allow some more time for further authorization of his diuretic medication.  PLAN: - Return to clinic in 4 to 6 weeks following acquisition of CMP, INR and BRTO protocol CT.  Will obtain laboratory values 1 to 2 days prior to his scheduled appointment for the most up-to-date calculated sodium meld score.  Thank you for this interesting consult.  I greatly enjoyed meeting MILEY LINDON and look forward to participating in their care.  A copy of this report was sent to the requesting provider on this date.  Electronically Signed: Sandi Mariscal 06/22/2018, 5:16 PM   I spent a total of 30 Minutes in face to face in clinical consultation, greater than 50% of which was counseling/coordinating care for TIPS creation.

## 2018-06-23 ENCOUNTER — Other Ambulatory Visit: Payer: Self-pay

## 2018-06-23 DIAGNOSIS — K7031 Alcoholic cirrhosis of liver with ascites: Secondary | ICD-10-CM

## 2018-06-24 ENCOUNTER — Ambulatory Visit (HOSPITAL_COMMUNITY)
Admission: RE | Admit: 2018-06-24 | Discharge: 2018-06-24 | Disposition: A | Discharge status: Home / Self Care | Payer: Medicare HMO | Source: Ambulatory Visit | Attending: Thoracic Surgery (Cardiothoracic Vascular Surgery) | Admitting: Thoracic Surgery (Cardiothoracic Vascular Surgery)

## 2018-06-24 DIAGNOSIS — C3431 Malignant neoplasm of lower lobe, right bronchus or lung: Secondary | ICD-10-CM | POA: Diagnosis not present

## 2018-06-24 DIAGNOSIS — R918 Other nonspecific abnormal finding of lung field: Secondary | ICD-10-CM | POA: Diagnosis not present

## 2018-06-24 DIAGNOSIS — C3490 Malignant neoplasm of unspecified part of unspecified bronchus or lung: Secondary | ICD-10-CM | POA: Diagnosis not present

## 2018-06-24 LAB — GLUCOSE, CAPILLARY: Glucose-Capillary: 130 mg/dL — ABNORMAL HIGH (ref 70–99)

## 2018-06-28 ENCOUNTER — Other Ambulatory Visit: Payer: Self-pay | Admitting: *Deleted

## 2018-06-28 ENCOUNTER — Telehealth: Payer: Self-pay

## 2018-06-28 ENCOUNTER — Other Ambulatory Visit: Payer: Self-pay | Admitting: Interventional Radiology

## 2018-06-28 DIAGNOSIS — K7031 Alcoholic cirrhosis of liver with ascites: Secondary | ICD-10-CM

## 2018-06-28 NOTE — Telephone Encounter (Signed)
Mr. Brusca contacted the office requesting more information on his PET scan done.  Advised patient that the results would be discussed with him by Dr. Roxan Hockey at his appointment scheduled for tomorrow which he is aware of.  He acknowledged receipt and confirmed location of our office.

## 2018-06-29 ENCOUNTER — Ambulatory Visit: Payer: Medicare HMO | Admitting: Thoracic Surgery (Cardiothoracic Vascular Surgery)

## 2018-06-29 ENCOUNTER — Telehealth: Payer: Self-pay | Admitting: Gastroenterology

## 2018-06-29 ENCOUNTER — Other Ambulatory Visit: Payer: Self-pay | Admitting: *Deleted

## 2018-06-29 VITALS — BP 120/72 | HR 74 | Resp 20 | Ht 67.0 in | Wt 194.0 lb

## 2018-06-29 DIAGNOSIS — R918 Other nonspecific abnormal finding of lung field: Secondary | ICD-10-CM

## 2018-06-29 NOTE — Progress Notes (Signed)
[address]       [place] [address]             [phone]     HPI: Mr. Batta returns for a scheduled follow-up visit  Yarel Rushlow 78 year old gentleman with a history of alcoholism, decompensated cirrhosis, PTSD, atrial fibrillation and flutter status post ablation, type 2 diabetes without complication, hyperlipidemia, hypertension, chronic edema, reflux, stage III chronic kidney disease, and bilateral lung nodules.  He was first noted to have bilateral lung nodules on a CT of the chest in February 2018.  He was being evaluated for shortness of breath.  He also was found to have calcification of his pericardium, aortic atherosclerosis, and three-vessel coronary calcification.  A follow-up CT was done 6 months later and the nodules were essentially unchanged they remained relatively stable in size until he recently had a CT which showed slight increase in the central solid component of the right lower lobe nodule.  Past Medical History:  Diagnosis Date  . Adenomatous polyps 06/2004  . Alcoholic cirrhosis of liver with ascites ([place])   . Alcoholism ([place])   . Allergy   . Anxiety    PTSD- no meds  . Atrial fibrillation ([place])   . Atrial flutter ([place])   . Chronic kidney disease    "beginnings of kidney failure"- 3-4 years ago  . Diabetes mellitus without complication (HCC)    no meds  . Diverticulosis   . GERD (gastroesophageal reflux disease)   . Hemorrhoids   . Hiatal hernia   . Hx of hernia repair   . Hyperlipidemia   . Hypertension   . Iron deficiency anemia   . Pedal edema   . Pneumonia    06-2016  . Substance abuse ([place])    alcohol use  . Ulcer   . Varicose veins     Current Outpatient Medications  Medication Sig Dispense Refill  . furosemide (LASIX) 40 MG tablet Take 2 tablets (80 mg) by mouth every morning and one 1 tablet (40 mg) in the evening 90 tablet 1  . hydrALAZINE (APRESOLINE) 25 MG tablet Take 25 mg by mouth daily as needed (high blood  pressure).     Marland Kitchen linaclotide (LINZESS) 145 MCG CAPS capsule Take 145 mcg by mouth daily as needed (constipation).    . metoprolol (TOPROL XL) 50 MG 24 hr tablet Take 50 mg by mouth daily as needed (high blood pressure).     Marland Kitchen omeprazole (PRILOSEC) 20 MG capsule Take 1 capsule (20 mg total) by mouth 2 (two) times daily. (Patient taking differently: Take 40 mg by mouth 2 (two) times daily. ) 60 capsule 5  . spironolactone (ALDACTONE) 100 MG tablet Take 2 tablets (200 mg) by mouth every morning and 1 tablet (100mg ) by mouth in the evening 90 tablet 1   No current facility-administered medications for this visit.     Physical Exam BP 120/72   Pulse 74   Resp 20   Ht 5\' 7"  (1.702 m)   Wt 194 lb (88 kg)   SpO2 97% Comment: RA  BMI 30.36 kg/m  78 year old man very anxious but in no acute distress Alert and oriented x3 with no focal deficits Lungs diminished breath sounds both bases, otherwise clear Cardiac regular rate and rhythm normal S1 and S2 No cervical supraclavicular adenopathy Abdomen distended  Diagnostic Tests: NUCLEAR MEDICINE PET SKULL BASE TO THIGH  TECHNIQUE: 9.72 mCi F-18 FDG was injected intravenously. Full-ring PET imaging was  performed from the skull base to thigh after the radiotracer. CT data was obtained and used for attenuation correction and anatomic localization.  Fasting blood glucose: 130 mg/dl  COMPARISON:  CT chest 05/18/2018  FINDINGS: Mediastinal blood pool activity: SUV max 2.67  NECK: No hypermetabolic lymph nodes in the neck.  Incidental CT findings: none  CHEST: No hypermetabolic axillary or supraclavicular lymph nodes. No mediastinal or hilar hypermetabolic lymph nodes identified.  The part solid nodule within the right lower lobe is again identified. The 1.3 cm part solid nodule within the right lower lobe is again identified and exhibits low level FDG uptake within SUV max of 1.13. This is not significantly changed in size from  05/18/2018. It is worth mentioning, that on remote exam from 02/07/2013 this nodule was present, appeared predominantly solid measuring 5.4 mm. The part solid nodule within the medial left upper lobe measures 1.2 cm and has an SUV max of 0.99. This nodule has also gradually increased in size in the interval. No additional pulmonary nodules identified.  Incidental CT findings: Again seen is diffuse pericardial calcifications which may be related to prior infection. Aortic atherosclerosis noted. Three vessel coronary artery atherosclerotic calcifications. Small to moderate bilateral pleural effusions and gynecomastia noted.  ABDOMEN/PELVIS: No abnormal uptake identified within the liver, pancreas, spleen. Unchanged small nodule in the left adrenal gland measuring 1.3 cm. This is stable from 02/07/2013 and is compatible with a benign adenoma. No hypermetabolic lymph nodes within the abdomen or pelvis.  Incidental CT findings: The liver is shrunken, nodular compatible with cirrhosis. Upper abdominal varicosities are identified. Moderate to large volume of abdominal ascites. Prominent mesenteric lymph nodes without significant FDG uptake are noted. Nonspecific in the setting of cirrhosis.  SKELETON: No focal hypermetabolic activity to suggest skeletal metastasis.  Incidental CT findings: none  IMPRESSION: 1. There is very low level radiotracer uptake associated with the left upper lobe part solid nodule. Low level FDG uptake is also associated with the right lower lobe part solid nodule. 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 Small pulmonary adenocarcinomas remain a possibility. 2. Morphologic features of the liver compatible with cirrhosis. Stigmata of portal venous hypertension is noted including ascites and abdominal varices. 3. Aortic atherosclerosis and coronary artery atherosclerotic calcifications. 4. Bilateral pleural effusions 5.  Pericardial calcifications  Aortic Atherosclerosis (ICD10-I70.0).   Electronically Signed   By: Kerby Moors M.D.   On: 06/24/2018 14:22 I personally reviewed the CT and PET/CT images and concur with the findings noted above.  Impression: Mr. Kokesh is a 78 year old gentleman with a complicated medical history including ethanol abuse, decompensated cirrhosis, ascites, PTSD, atrial fibrillation and flutter status post ablation, type 2 diabetes without complication, hyperlipidemia, hypertension, chronic edema, reflux, stage III chronic kidney disease, and bilateral lung nodules.  He said bilateral lung nodules present for a couple of years now.  There may have been a very slight increase in size and a very slight increase in the solid component.  There is minimal activity on PET/CT.  It is possible these represent low-grade adenocarcinoma is always within the differential.  Even if they do turn out to be adenocarcinomas, they are not nearly as life-threatening as some of his other medical problems.  Nonetheless, he is very anxious about these nodules and wants to be very aggressive.  He is not a surgical candidate.  I think his best option for treatment would be stereotactic radiation.  He would need a biopsy prior to initiation of treatment.  I  think the best way to do that would be with navigational bronchoscopy so that both sides can be sampled and we could place fiducials at the time of the procedure.  I recommended that we proceed with navigational bronchoscopy for biopsy and fiducial placement.  I discussed the general nature of the procedure with Mr. Oshita and his son.  They understand it would be done in the operating room under general anesthesia.  They understand this would be diagnostic and not therapeutic.  They understand it does involve general anesthesia and the risks that go along with that.  I informed him of the indications, risk, benefits, and alternatives.  They understand the  risks include but not limited to death, MI, DVT, PE, stroke, bleeding, pneumothorax, and failure to make a diagnosis, as well as the possibility of other unforeseeable complications.  He wishes to proceed  Plan: ENB with fiducial placement on Thursday, 07/08/2018  [name], MD [facility] [phone]

## 2018-06-29 NOTE — Telephone Encounter (Signed)
Patient reports he was diagnosed with lung CA.  He will find out more details today.  He is not sure if he will be able to keep the appt for TIPS eval if he has to have chemo.  He will call back if he needs to cancel

## 2018-06-29 NOTE — Telephone Encounter (Signed)
Pt called to report that Lasix is working well for him.  He would like a call back to discuss CT scan and Pet scan.

## 2018-06-29 NOTE — H&P (View-Only) (Signed)
[address]       [place] [address]             [phone]     HPI: [name] returns for a scheduled follow-up visit  [name] 78 year old gentleman with a history of alcoholism, decompensated cirrhosis, PTSD, atrial fibrillation and flutter status post ablation, type 2 diabetes without complication, hyperlipidemia, hypertension, chronic edema, reflux, stage III chronic kidney disease, and bilateral lung nodules.  He was first noted to have bilateral lung nodules on a CT of the chest in February 2018.  He was being evaluated for shortness of breath.  He also was found to have calcification of his pericardium, aortic atherosclerosis, and three-vessel coronary calcification.  A follow-up CT was done 6 months later and the nodules were essentially unchanged they remained relatively stable in size until he recently had a CT which showed slight increase in the central solid component of the right lower lobe nodule.  Past Medical History:  Diagnosis Date  . Adenomatous polyps 06/2004  . Alcoholic cirrhosis of liver with ascites ([place])   . Alcoholism ([place])   . Allergy   . Anxiety    PTSD- no meds  . Atrial fibrillation ([place])   . Atrial flutter ([place])   . Chronic kidney disease    "beginnings of kidney failure"- 3-4 years ago  . Diabetes mellitus without complication (HCC)    no meds  . Diverticulosis   . GERD (gastroesophageal reflux disease)   . Hemorrhoids   . Hiatal hernia   . Hx of hernia repair   . Hyperlipidemia   . Hypertension   . Iron deficiency anemia   . Pedal edema   . Pneumonia    06-2016  . Substance abuse ([place])    alcohol use  . Ulcer   . Varicose veins     Current Outpatient Medications  Medication Sig Dispense Refill  . furosemide (LASIX) 40 MG tablet Take 2 tablets (80 mg) by mouth every morning and one 1 tablet (40 mg) in the evening 90 tablet 1  . hydrALAZINE (APRESOLINE) 25 MG tablet Take 25 mg by mouth daily as needed (high blood  pressure).     Marland Kitchen linaclotide (LINZESS) 145 MCG CAPS capsule Take 145 mcg by mouth daily as needed (constipation).    . metoprolol (TOPROL XL) 50 MG 24 hr tablet Take 50 mg by mouth daily as needed (high blood pressure).     Marland Kitchen omeprazole (PRILOSEC) 20 MG capsule Take 1 capsule (20 mg total) by mouth 2 (two) times daily. (Patient taking differently: Take 40 mg by mouth 2 (two) times daily. ) 60 capsule 5  . spironolactone (ALDACTONE) 100 MG tablet Take 2 tablets (200 mg) by mouth every morning and 1 tablet (100mg ) by mouth in the evening 90 tablet 1   No current facility-administered medications for this visit.     Physical Exam BP 120/72   Pulse 74   Resp 20   Ht 5\' 7"  (1.702 m)   Wt 194 lb (88 kg)   SpO2 97% Comment: RA  BMI 30.4 kg/m  78 year old man very anxious but in no acute distress Alert and oriented x3 with no focal deficits Lungs diminished breath sounds both bases, otherwise clear Cardiac regular rate and rhythm normal S1 and S2 No cervical supraclavicular adenopathy Abdomen distended  Diagnostic Tests: NUCLEAR MEDICINE PET SKULL BASE TO THIGH  TECHNIQUE: 9.72 mCi F-18 FDG was injected intravenously. Full-ring PET imaging was  performed from the skull base to thigh after the radiotracer. CT data was obtained and used for attenuation correction and anatomic localization.  Fasting blood glucose: 130 mg/dl  COMPARISON:  CT chest 05/18/2018  FINDINGS: Mediastinal blood pool activity: SUV max 2.67  NECK: No hypermetabolic lymph nodes in the neck.  Incidental CT findings: none  CHEST: No hypermetabolic axillary or supraclavicular lymph nodes. No mediastinal or hilar hypermetabolic lymph nodes identified.  The part solid nodule within the right lower lobe is again identified. The 1.3 cm part solid nodule within the right lower lobe is again identified and exhibits low level FDG uptake within SUV max of 1.13. This is not significantly changed in size from  05/18/2018. It is worth mentioning, that on remote exam from 02/07/2013 this nodule was present, appeared predominantly solid measuring 5.4 mm. The part solid nodule within the medial left upper lobe measures 1.2 cm and has an SUV max of 0.99. This nodule has also gradually increased in size in the interval. No additional pulmonary nodules identified.  Incidental CT findings: Again seen is diffuse pericardial calcifications which may be related to prior infection. Aortic atherosclerosis noted. Three vessel coronary artery atherosclerotic calcifications. Small to moderate bilateral pleural effusions and gynecomastia noted.  ABDOMEN/PELVIS: No abnormal uptake identified within the liver, pancreas, spleen. Unchanged small nodule in the left adrenal gland measuring 1.3 cm. This is stable from 02/07/2013 and is compatible with a benign adenoma. No hypermetabolic lymph nodes within the abdomen or pelvis.  Incidental CT findings: The liver is shrunken, nodular compatible with cirrhosis. Upper abdominal varicosities are identified. Moderate to large volume of abdominal ascites. Prominent mesenteric lymph nodes without significant FDG uptake are noted. Nonspecific in the setting of cirrhosis.  SKELETON: No focal hypermetabolic activity to suggest skeletal metastasis.  Incidental CT findings: none  IMPRESSION: 1. There is very low level radiotracer uptake associated with the left upper lobe part solid nodule. Low level FDG uptake is also associated with the right lower lobe part solid nodule. 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 Small pulmonary adenocarcinomas remain a possibility. 2. Morphologic features of the liver compatible with cirrhosis. Stigmata of portal venous hypertension is noted including ascites and abdominal varices. 3. Aortic atherosclerosis and coronary artery atherosclerotic calcifications. 4. Bilateral pleural effusions 5.  Pericardial calcifications  Aortic Atherosclerosis (ICD10-I70.0).   Electronically Signed   By: Kerby Moors M.D.   On: 06/24/2018 14:22 I personally reviewed the CT and PET/CT images and concur with the findings noted above.  Impression: [name] is a 78 year old gentleman with a complicated medical history including ethanol abuse, decompensated cirrhosis, ascites, PTSD, atrial fibrillation and flutter status post ablation, type 2 diabetes without complication, hyperlipidemia, hypertension, chronic edema, reflux, stage III chronic kidney disease, and bilateral lung nodules.  He said bilateral lung nodules present for a couple of years now.  There may have been a very slight increase in size and a very slight increase in the solid component.  There is minimal activity on PET/CT.  It is possible these represent low-grade adenocarcinoma is always within the differential.  Even if they do turn out to be adenocarcinomas, they are not nearly as life-threatening as some of his other medical problems.  Nonetheless, he is very anxious about these nodules and wants to be very aggressive.  He is not a surgical candidate.  I think his best option for treatment would be stereotactic radiation.  He would need a biopsy prior to initiation of treatment.  I  think the best way to do that would be with navigational bronchoscopy so that both sides can be sampled and we could place fiducials at the time of the procedure.  I recommended that we proceed with navigational bronchoscopy for biopsy and fiducial placement.  I discussed the general nature of the procedure with [name] and his son.  They understand it would be done in the operating room under general anesthesia.  They understand this would be diagnostic and not therapeutic.  They understand it does involve general anesthesia and the risks that go along with that.  I informed him of the indications, risk, benefits, and alternatives.  They understand the  risks include but not limited to death, MI, DVT, PE, stroke, bleeding, pneumothorax, and failure to make a diagnosis, as well as the possibility of other unforeseeable complications.  He wishes to proceed  Plan: ENB with fiducial placement on Thursday, 07/08/2018  [name], MD [facility] [phone]

## 2018-07-02 ENCOUNTER — Other Ambulatory Visit (INDEPENDENT_AMBULATORY_CARE_PROVIDER_SITE_OTHER): Payer: Medicare HMO

## 2018-07-02 DIAGNOSIS — K7031 Alcoholic cirrhosis of liver with ascites: Secondary | ICD-10-CM

## 2018-07-02 LAB — BASIC METABOLIC PANEL
BUN: 19 mg/dL (ref 6–23)
CO2: 30 mEq/L (ref 19–32)
Calcium: 8.8 mg/dL (ref 8.4–10.5)
Chloride: 91 mEq/L — ABNORMAL LOW (ref 96–112)
Creatinine, Ser: 1.02 mg/dL (ref 0.40–1.50)
GFR: 71.17 mL/min (ref >60.00)
Glucose, Bld: 118 mg/dL — ABNORMAL HIGH (ref 70–99)
Potassium: 4.5 mEq/L (ref 3.5–5.1)
Sodium: 129 mEq/L — ABNORMAL LOW (ref 135–145)

## 2018-07-05 NOTE — Pre-Procedure Instructions (Signed)
JERAME HEDDING  07/05/2018      [place], [place], [address], [address] [place] [address] Phone: [phone] Fax: [phone]    Your procedure is scheduled on February 20  Report to [facility] at 0600 A.M.  Call this number if you have problems the morning of surgery:  [phone]   Remember:  Do not eat or drink after midnight    Take these medicines the morning of surgery with A SIP OF WATER  linaclotide (LINZESS) if needed omeprazole (PRILOSEC) metoprolol (TOPROL XL) Eye drops if needed  7 days prior to surgery STOP taking any Aspirin (unless otherwise instructed by your surgeon), Aleve, Naproxen, Ibuprofen, Motrin, Advil, Goody's, BC's, all herbal medications, fish oil, and all vitamins.     Do not wear jewelry  Do not wear lotions, powders, or cologne, or deodorant.   Men may shave face and neck.  Do not bring valuables to the hospital.  [facility] is not responsible for any belongings or valuables.  Contacts, dentures or bridgework may not be worn into surgery.  Leave your suitcase in the car.  After surgery it may be brought to your room.  For patients admitted to the hospital, discharge time will be determined by your treatment team.  Patients discharged the day of surgery will not be allowed to drive home.    Special instructions:   [facility]- Preparing For Surgery  Before surgery, you can play an important role. Because skin is not sterile, your skin needs to be as free of germs as possible. You can reduce the number of germs on your skin by washing with CHG (chlorahexidine gluconate) Soap before surgery.  CHG is an antiseptic cleaner which kills germs and bonds with the skin to continue killing germs even after washing.    Oral Hygiene is also important to reduce your risk of infection.  Remember - BRUSH YOUR TEETH THE MORNING OF SURGERY WITH YOUR REGULAR  TOOTHPASTE  Please do not use if you have an allergy to CHG or antibacterial soaps. If your skin becomes reddened/irritated stop using the CHG.  Do not shave (including legs and underarms) for at least 48 hours prior to first CHG shower. It is OK to shave your face.  Please follow these instructions carefully.   1. Shower the NIGHT BEFORE SURGERY and the MORNING OF SURGERY with CHG.   2. If you chose to wash your hair, wash your hair first as usual with your normal shampoo.  3. After you shampoo, rinse your hair and body thoroughly to remove the shampoo.  4. Use CHG as you would any other liquid soap. You can apply CHG directly to the skin and wash gently with a scrungie or a clean washcloth.   5. Apply the CHG Soap to your body ONLY FROM THE NECK DOWN.  Do not use on open wounds or open sores. Avoid contact with your eyes, ears, mouth and genitals (private parts). Wash Face and genitals (private parts)  with your normal soap.  6. Wash thoroughly, paying special attention to the area where your surgery will be performed.  7. Thoroughly rinse your body with warm water from the neck down.  8. DO NOT shower/wash with your normal soap after using and rinsing off the CHG Soap.  9. Pat yourself dry with a CLEAN TOWEL.  10. Wear CLEAN PAJAMAS to bed the night before surgery, wear comfortable  clothes the morning of surgery  11. Place CLEAN SHEETS on your bed the night of your first shower and DO NOT SLEEP WITH PETS.    Day of Surgery:  Do not apply any deodorants/lotions.  Please wear clean clothes to the hospital/surgery center.   Remember to brush your teeth WITH YOUR REGULAR TOOTHPASTE.    Please read over the following fact sheets that you were given.

## 2018-07-06 ENCOUNTER — Other Ambulatory Visit: Payer: Self-pay

## 2018-07-06 ENCOUNTER — Encounter (HOSPITAL_COMMUNITY): Payer: Self-pay

## 2018-07-06 ENCOUNTER — Encounter (HOSPITAL_COMMUNITY)
Admission: RE | Admit: 2018-07-06 | Discharge: 2018-07-06 | Disposition: A | Discharge status: Home / Self Care | Payer: Medicare HMO | Source: Ambulatory Visit | Attending: Thoracic Surgery (Cardiothoracic Vascular Surgery) | Admitting: Thoracic Surgery (Cardiothoracic Vascular Surgery)

## 2018-07-06 ENCOUNTER — Ambulatory Visit (HOSPITAL_COMMUNITY)
Admission: RE | Admit: 2018-07-06 | Discharge: 2018-07-06 | Disposition: A | Discharge status: Home / Self Care | Payer: Medicare HMO | Source: Ambulatory Visit | Attending: Thoracic Surgery (Cardiothoracic Vascular Surgery) | Admitting: Thoracic Surgery (Cardiothoracic Vascular Surgery)

## 2018-07-06 DIAGNOSIS — E785 Hyperlipidemia, unspecified: Secondary | ICD-10-CM | POA: Insufficient documentation

## 2018-07-06 DIAGNOSIS — I10 Essential (primary) hypertension: Secondary | ICD-10-CM | POA: Insufficient documentation

## 2018-07-06 DIAGNOSIS — J90 Pleural effusion, not elsewhere classified: Secondary | ICD-10-CM | POA: Diagnosis not present

## 2018-07-06 DIAGNOSIS — J9811 Atelectasis: Secondary | ICD-10-CM | POA: Diagnosis not present

## 2018-07-06 DIAGNOSIS — R918 Other nonspecific abnormal finding of lung field: Secondary | ICD-10-CM

## 2018-07-06 DIAGNOSIS — Z01818 Encounter for other preprocedural examination: Secondary | ICD-10-CM | POA: Insufficient documentation

## 2018-07-06 HISTORY — DX: Contact with and (suspected) exposure to other hazardous, chiefly nonmedicinal, chemicals: Z77.098

## 2018-07-06 LAB — COMPREHENSIVE METABOLIC PANEL
ALT: 15 U/L (ref 0–44)
AST: 19 U/L (ref 15–41)
Albumin: 3.3 g/dL — ABNORMAL LOW (ref 3.5–5.0)
Alkaline Phosphatase: 118 U/L (ref 38–126)
Anion gap: 14 (ref 5–15)
BUN: 17 mg/dL (ref 8–23)
CO2: 27 mmol/L (ref 22–32)
CREATININE: 1.06 mg/dL (ref 0.61–1.24)
Calcium: 9 mg/dL (ref 8.9–10.3)
Chloride: 90 mmol/L — ABNORMAL LOW (ref 98–111)
GFR calc non Af Amer: >60 mL/min (ref >60)
Glucose, Bld: 109 mg/dL — ABNORMAL HIGH (ref 70–99)
Potassium: 3.7 mmol/L (ref 3.5–5.1)
Sodium: 131 mmol/L — ABNORMAL LOW (ref 135–145)
Total Bilirubin: 2.1 mg/dL — ABNORMAL HIGH (ref 0.3–1.2)
Total Protein: 7.5 g/dL (ref 6.5–8.1)

## 2018-07-06 LAB — CBC
HCT: 38.4 % — ABNORMAL LOW (ref 39.0–52.0)
Hemoglobin: 12.6 g/dL — ABNORMAL LOW (ref 13.0–17.0)
MCH: 30.2 pg (ref 26.0–34.0)
MCHC: 32.8 g/dL (ref 30.0–36.0)
MCV: 92.1 fL (ref 80.0–100.0)
Platelets: 162 10*3/uL (ref 150–400)
RBC: 4.17 MIL/uL — AB (ref 4.22–5.81)
RDW: 14.8 % (ref 11.5–15.5)
WBC: 4.5 10*3/uL (ref 4.0–10.5)
nRBC: 0 % (ref 0.0–0.2)

## 2018-07-06 LAB — GLUCOSE, CAPILLARY: GLUCOSE-CAPILLARY: 89 mg/dL (ref 70–99)

## 2018-07-06 LAB — APTT: aPTT: 40 seconds — ABNORMAL HIGH (ref 24–36)

## 2018-07-06 LAB — PROTIME-INR
INR: 1.26
Prothrombin Time: 15.7 seconds — ABNORMAL HIGH (ref 11.4–15.2)

## 2018-07-06 NOTE — Progress Notes (Signed)
Left vm with Thurmond Butts RN about patients labs

## 2018-07-06 NOTE — Progress Notes (Signed)
PCP - Shon Baton Cardiologist - denies  Chest x-ray - 07/06/18 EKG - 07/06/18 Stress Test - denies ECHO - 08/09/15 Cardiac Cath - denies  Fasting Blood Sugar - doesn't check blood sugars  Anesthesia review: yes, hx afib, aflutter, no cardiology follow-up  Patient denies shortness of breath, fever, cough and chest pain at PAT appointment   Patient verbalized understanding of instructions that were given to them at the PAT appointment. Patient was also instructed that they will need to review over the PAT instructions again at home before surgery.

## 2018-07-07 ENCOUNTER — Encounter (HOSPITAL_COMMUNITY): Payer: Self-pay | Admitting: Physician Assistant

## 2018-07-07 ENCOUNTER — Telehealth: Payer: Self-pay | Admitting: Internal Medicine

## 2018-07-07 ENCOUNTER — Other Ambulatory Visit: Payer: Self-pay | Admitting: *Deleted

## 2018-07-07 ENCOUNTER — Encounter (HOSPITAL_COMMUNITY): Payer: Self-pay | Admitting: Anesthesiology

## 2018-07-07 LAB — HEMOGLOBIN A1C
Hgb A1c MFr Bld: 5.7 % — ABNORMAL HIGH (ref 4.8–5.6)
Mean Plasma Glucose: 117 mg/dL

## 2018-07-07 NOTE — Telephone Encounter (Signed)
Just now getting message which was not directly communicated with me.  I have sent Dr Virgina Jock a message to contact me directly.  Thompson Grayer MD, [facility] 07/07/2018 8:38 PM

## 2018-07-07 NOTE — Telephone Encounter (Signed)
New Message   Dr Shon Baton from [facility] would like to speak with an EP doctor. He asked for Dr [name] or Dr Lovena Le. Please call

## 2018-07-07 NOTE — Progress Notes (Signed)
Anesthesia Chart Review:  Case:  [id number] Date/Time:  07/08/18 0745   Procedures:      VIDEO BRONCHOSCOPY WITH ENDOBRONCHIAL NAVIGATION (N/A )     PLACEMENT OF FUDUCIAL (N/A )   Anesthesia type:  General   Pre-op diagnosis:  BILATERAL LUNG NODULES   Location:  [facility] OR ROOM 10 / [facility]   Surgeon:  [name], MD      DISCUSSION: 78 yo male former smoker. Pertinent hx includes alcoholism, decompensated cirrhosis (requiring monthly paracentesis), PTSD, atrial fibrillation and flutter status post ablation, type 2 diabetes without complication, hyperlipidemia, hypertension, chronic edema, reflux, stage III chronic kidney disease, and bilateral lung nodules.  Remote history of aflutter ablation, no recurrence. Does not currently follow with cardiology. Echo 08/09/2015 showed EF 55-60%, normal wall motion. This is followed now by PCP Dr. Virgina Jock. Last OV 05/05/2018 stated pt "doing well at this time, no palpitations, regular heart rate and rhythm."  Decompensated cirrhosis with refractory ascites secondary to alcoholic cirrhosis. Currently having monthly paracentesis. Followed by GI, Dr. Fuller Plan, but primarily managed by PCP Dr. Virgina Jock.  EKG 07/07/2018 read as septal infarct, age undetermined, which was not present on previous EKG. 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 Discussed with Dr. Roanna Banning. He advised that given EKG changes and significant risk factors, will need input from PCP.  I spoke with Dr. Virgina Jock. He reviewed the EKG. He said he did not have high concern for ischemia based on the tracing. He did however have some concern about the very prolonged 1st degree block. 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 After some discussion, Dr. Virgina Jock called and spoke with Dr. Caryl Comes for additional input. He stated that Dr. Caryl Comes did not recommend any additional workup at this time, advised it was okay to  continue toprol. Dr. Virgina Jock did reiterate that the pt is overall at least moderate risk for surgery given his multiple significant comorbidities, but he did not feel that there was any reason to delay surgery for any additional workup as he is likely optimized at this point.   Anticipate he can proceed as planned barring acute status change.   VS: BP (!) 115/59   Pulse 66   Temp 36.7 C   Ht 5\' 7"  (1.702 m)   Wt 86.4 kg   SpO2 95%   BMI 29.84 kg/m   PROVIDERS: Shon Baton, MD is PCP  Lucio Edward, MD is Gastroenterologist  LABS: Labs reviewed: Acceptable for surgery. (all labs ordered are listed, but only abnormal results are displayed)  Labs Reviewed  HEMOGLOBIN A1C - Abnormal; Notable for the following components:      Result Value   Hgb A1c MFr Bld 5.7 (*)    All other components within normal limits  APTT - Abnormal; Notable for the following components:   aPTT 40 (*)    All other components within normal limits  CBC - Abnormal; Notable for the following components:   RBC 4.17 (*)    Hemoglobin 12.6 (*)    HCT 38.4 (*)    All other components within normal limits  COMPREHENSIVE METABOLIC PANEL - Abnormal; Notable for the following components:   Sodium 131 (*)    Chloride 90 (*)    Glucose, Bld 109 (*)    Albumin 3.3 (*)    Total Bilirubin 2.1 (*)    All other components within normal limits  PROTIME-INR - Abnormal; Notable for the following components:   Prothrombin Time 15.7 (*)    All other components within normal limits  GLUCOSE, CAPILLARY     IMAGES: NM PET Imaging 06/24/2018: IMPRESSION: 1. There is very low level radiotracer uptake associated with the left upper lobe part solid nodule. Low level FDG uptake is also associated with the right lower lobe part solid nodule. 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 Small pulmonary adenocarcinomas remain a possibility. 2. Morphologic features of the liver compatible with  cirrhosis. Stigmata of portal venous hypertension is noted including ascites and abdominal varices. 3. Aortic atherosclerosis and coronary artery atherosclerotic calcifications. 4. Bilateral pleural effusions 5. Pericardial calcifications  EKG: 07/06/2018: Sinus rhythm with marked 1st degree A-V block. Rate 63. Left anterior hemiblock. Septal infarct , age undetermined is. New since previous tracing 02/17/16  CV: TTE 08/09/2015: Study Conclusions  - Left ventricle: The cavity size was normal. Wall thickness was   normal. Systolic function was normal. The estimated ejection   fraction was in the range of 55% to 60%. Wall motion was normal;   there were no regional wall motion abnormalities. The study is   not technically sufficient to allow evaluation of LV diastolic   function. - Aortic valve: Valve mobility was mildly restricted. There was   trivial regurgitation. - Left atrium: The atrium was mildly dilated.  Impressions:  - Normal LV systolic function; trace AI and MR; mild LAE.  Past Medical History:  Diagnosis Date  . Adenomatous polyps 06/2004  . Agent orange exposure   . Alcoholic cirrhosis of liver with ascites ([place])   . Alcoholism ([place])   . Allergy   . Anxiety    PTSD- no meds  . Atrial fibrillation ([place])   . Atrial flutter ([place])    had ablation   . Chronic kidney disease    "beginnings of kidney failure"- 3-4 years ago  . Diabetes mellitus without complication (H[facility])    no meds  . Diverticulosis   . GERD (gastroesophageal reflux disease)   . Hemorrhoids   . Hiatal hernia   . Hx of hernia repair   . Hyperlipidemia   . Hypertension   . Iron deficiency anemia   . Pedal edema   . Pneumonia    06-2016  . Substance abuse ([place])    alcohol use  . Ulcer   . Varicose veins     Past Surgical History:  Procedure Laterality Date  . ATRIAL ABLATION SURGERY    . COLONOSCOPY    . IR PARACENTESIS  10/28/2016  . IR PARACENTESIS  11/12/2016  . IR PARACENTESIS   12/02/2016  . IR PARACENTESIS  12/10/2016  . IR PARACENTESIS  12/30/2016  . IR PARACENTESIS  01/27/2017  . IR PARACENTESIS  09/09/2017  . IR PARACENTESIS  03/04/2018  . IR PARACENTESIS  04/08/2018  . IR PARACENTESIS  05/10/2018  . IR PARACENTESIS  05/21/2018  . IR PARACENTESIS  06/16/2018  . IR RADIOLOGIST EVAL & MGMT  06/22/2018  . POLYPECTOMY    . TONSILLECTOMY    . UMBILICAL HERNIA REPAIR    . VARICOSE VEIN SURGERY     x4    MEDICATIONS: . furosemide (LASIX) 40 MG tablet  . hydrALAZINE (APRESOLINE) 25 MG tablet  . linaclotide (LINZESS) 145 MCG CAPS capsule  . metoprolol (TOPROL XL) 50 MG 24 hr tablet  . omeprazole (PRILOSEC) 20 MG capsule  . [employer]iatric [employer] (EUCERIN ADVANCED REPAIR) CREA  . spironolactone (ALDACTONE) 100 MG  tablet  . tetrahydrozoline (VISINE) 0.05 % ophthalmic solution   No current facility-administered medications for this encounter.     Wynonia Musty [facility] Short Stay Center/Anesthesiology Phone [phone] 07/07/2018 9:11 AM

## 2018-07-07 NOTE — Anesthesia Preprocedure Evaluation (Deleted)
Anesthesia Evaluation    Airway        Dental   Pulmonary former smoker,           Cardiovascular hypertension,      Neuro/Psych    GI/Hepatic   Endo/Other  diabetes  Renal/GU      Musculoskeletal   Abdominal   Peds  Hematology   Anesthesia Other Findings   Reproductive/Obstetrics                             Anesthesia Physical Anesthesia Plan  ASA:   Anesthesia Plan:    Post-op Pain Management:    Induction:   PONV Risk Score and Plan:   Airway Management Planned:   Additional Equipment:   Intra-op Plan:   Post-operative Plan:   Informed Consent:   Plan Discussed with:   Anesthesia Plan Comments: (See PAT note by Karoline [place], PA-C )        Anesthesia Quick Evaluation

## 2018-07-07 NOTE — Telephone Encounter (Signed)
I will route this message to both Dr. [name] and Dr. Lovena Le.

## 2018-07-08 ENCOUNTER — Ambulatory Visit (HOSPITAL_COMMUNITY)
Admission: RE | Admit: 2018-07-08 | Payer: Medicare HMO | Source: Home / Self Care | Admitting: Thoracic Surgery (Cardiothoracic Vascular Surgery)

## 2018-07-08 ENCOUNTER — Encounter (HOSPITAL_COMMUNITY): Admission: RE | Payer: Self-pay | Source: Home / Self Care

## 2018-07-08 SURGERY — VIDEO BRONCHOSCOPY WITH ENDOBRONCHIAL NAVIGATION
Anesthesia: General

## 2018-07-13 ENCOUNTER — Telehealth: Payer: Self-pay | Admitting: Gastroenterology

## 2018-07-13 NOTE — Telephone Encounter (Signed)
Patient notified he will need to speak with the office that ordered the CT, and advised I can't cancel another providers orders. .  Patient verbalized understanding.

## 2018-07-13 NOTE — Progress Notes (Signed)
[facility] NOTE  Patient Care Team: Shon Baton, MD as PCP - General  HEME/ONC OVERVIEW: 1. 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 RLL and LUL nodules of unknown significance -06/2018: PET showed RLL nodule 1.3cm (SUV 1.13), and a second LUL nodule 1.2cm (SUV 0.99); thoracic surgery evaluation ongoing  PERTINENT NON-HEM/ONC PROBLEMS: 1. 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 He has known small bilateral lung nodules dating back to 06/2016, and most recent PET in 06/2018 showed slightly enlarging bilateral pulmonary nodules with very low FDG avidity. He was scheduled to undergo ENB with fiducial placement on 07/08/2018, w/ plan for SBRT if the biopsy showed malignancy, but the surgery was cancelled after labs showed elevated PT and PTT.  -In light of the patient's longstanding decompensated cirrhosis, the coagulopathy is most likely secondary to cirrhosis  -In patients with established cirrhosis, Child Pugh classification and MELD score may be helpful in in estimating operative risk; surgery is generally permissible in patients with cirrhosis and CP Class B and MELD score of 10 to 15 -I explained to the patient that in the setting of his longstanding cirrhosis, his risk of bleeding is generally higher than average risk patient; however, given that he is Class B by Child Pugh classification and his MELD score is 12, the risk of perioperative bleeding does not appear prohibitive; consultation with anesthesia may be  helpful, such as TEG -FFP or cryoprecipitate is not routinely recommended to correct coagulopathy prior to a procedure, as several large reviews of available evidence have shown no clinical benefits, and plasma products can increase the risk of transfusion and volume overload -As patients with cirrhosis can develop concurrent nutritional deficiencies, I have prescribed oral Vitamin K 5mg  daily x 5 days, which may help improve coagulopathy -No further work-up indicated from a coagulopathy standpoint   RLL and LUL nodules of unknown significance -I independently reviewed the radiologic images of recent PET, agree with findings as documented -In summary, PET in 06/2018 showed RLL nodule 1.3 cm (SUV 1.13) and a second LUL nodule 1.2 cm (SUV 0.9), etiology unclear; however, given the slowly enlarging nodules, malignancies cannot be ruled out -Patient is being evaluated for ENB with fiducial placement by thoracic surgery -Pending biopsy results, we can determine if any treatment is indicated  Hyponatremia -Secondary to volume overload from decompensated cirrhosis -Patient receives regular paracentesis, last approximately one month ago -He is asymptomatic from hyponatremia standpoint -I encouraged patient to follow up with his gastroenterologist for paracentesis as indicated  Leukopenia -Likely secondary to underlying cirrhosis -WBC 3.8k with [place] 2500 -Patient denies any symptoms of bleeding -We will monitor for now; no indication for further work-up at this time  All questions were answered. The patient knows to call the clinic with any problems, questions or concerns.  Return as needed, pending the thoracic surgery evaluation for the lung nodules.  [name], MD 07/16/2018 9:27 AM   CHIEF COMPLAINTS/PURPOSE OF CONSULTATION:  "I am told my clotting numbers are off"  HISTORY OF PRESENTING ILLNESS:  [name] 78 y.o. male is here because of coagulopathy.  Patient reports that he was  undergoing evaluation for lung nodules by thoracic surgery, and it was scheduled undergo surgery for biopsy and fiducial placement.  However, prior to surgery, his labs were notable for mild coagulopathy, for which patient was referred to hematology for further evaluation.  Patient reports that he has not had any problems with bleeding or excess bruising in the past, including procedures such as colonoscopy and EGD.  He undergoes regular paracentesis for recurrent ascites secondary to alcoholic cirrhosis.  He reports that he is abdomen is mildly swollen, but otherwise denies any other complaint today.  MEDICAL HISTORY:  Past Medical History:  Diagnosis Date  . Adenomatous polyps 06/2004  . Agent orange exposure   . Alcoholic cirrhosis of liver with ascites ([place])   . Alcoholism ([place])   . Allergy   . Anxiety    PTSD- no meds  . Atrial fibrillation ([place])   . Atrial flutter ([place])    had ablation   . Chronic kidney disease    "beginnings of kidney failure"- 3-4 years ago  . Diabetes mellitus without complication ([facility])    no meds  . Diverticulosis   . GERD (gastroesophageal reflux disease)   . Hemorrhoids   . Hiatal hernia   . Hx of hernia repair   . Hyperlipidemia   . Hypertension   . Iron deficiency anemia   . Pedal edema   . Pneumonia    06-2016  . Substance abuse ([place])    alcohol use  . Ulcer   . Varicose veins     SURGICAL HISTORY: Past Surgical History:  Procedure Laterality Date  . ATRIAL ABLATION SURGERY    . COLONOSCOPY    . IR PARACENTESIS  10/28/2016  . IR PARACENTESIS  11/12/2016  . IR PARACENTESIS  12/02/2016  . IR PARACENTESIS  12/10/2016  . IR PARACENTESIS  12/30/2016  . IR PARACENTESIS  01/27/2017  . IR PARACENTESIS  09/09/2017  . IR PARACENTESIS  03/04/2018  . IR PARACENTESIS  04/08/2018  . IR PARACENTESIS  05/10/2018  . IR PARACENTESIS  05/21/2018  . IR PARACENTESIS  06/16/2018  . IR RADIOLOGIST EVAL & MGMT  06/22/2018  . POLYPECTOMY    . TONSILLECTOMY    .  UMBILICAL HERNIA REPAIR    . VARICOSE VEIN SURGERY     x4    SOCIAL HISTORY: Social History   Socioeconomic History  . Marital status: Single    Spouse name: Not on file  . Number of children: 5  . Years of education: Not on file  . Highest education level: Not on file  Occupational History  . Occupation: retired    Fish farm manager: RETIRED  Social Needs  . Financial resource strain: Not on file  . Food insecurity:    Worry: Not on file    Inability: Not on file  . Transportation needs:    Medical: Not on file    Non-medical: Not on file  Tobacco Use  . Smoking status: Former Smoker    Types: Cigarettes    Last attempt to quit: 01/16/2011    Years since quitting: 7.5  . Smokeless tobacco: Never Used  Substance and Sexual Activity  . Alcohol use: Yes    Alcohol/week: 28.0 standard drinks    Types: 7 Glasses of wine, 21 Cans of beer per week    Comment: 3- daily  . Drug use: No  . Sexual activity: Not on file  Lifestyle  . Physical activity:    Days per week: Not on file  Minutes per session: Not on file  . Stress: Not on file  Relationships  . Social connections:    Talks on phone: Not on file    Gets together: Not on file    Attends religious service: Not on file    Active member of club or organization: Not on file    Attends meetings of clubs or organizations: Not on file    Relationship status: Not on file  . Intimate partner violence:    Fear of current or ex partner: Not on file    Emotionally abused: Not on file    Physically abused: Not on file    Forced sexual activity: Not on file  Other Topics Concern  . Not on file  Social History Narrative  . Not on file    FAMILY HISTORY: Family History  Problem Relation Age of Onset  . Colon polyps Brother   . Prostate cancer Brother   . Heart disease Father        rheumatic fever as child  . Stroke Mother   . Colon cancer Neg Hx   . Esophageal cancer Neg Hx   . Rectal cancer Neg Hx   . Stomach cancer  Neg Hx     ALLERGIES:  is allergic to lisinopril; lipitor [atorvastatin]; and sulfonamide derivatives.  MEDICATIONS:  Current Outpatient Medications  Medication Sig Dispense Refill  . furosemide (LASIX) 40 MG tablet Take 2 tablets (80 mg) by mouth every morning and one 1 tablet (40 mg) in the evening (Patient taking differently: Take 40-80 mg by mouth See admin instructions. Take 80 mg by mouth in the morning and take 40 mg by mouth in the evening) 90 tablet 1  . hydrALAZINE (APRESOLINE) 25 MG tablet Take 25 mg by mouth at bedtime as needed (for HBP >130).     Marland Kitchen linaclotide (LINZESS) 145 MCG CAPS capsule Take 145 mcg by mouth daily as needed (for constipation).     . metoprolol (TOPROL XL) 50 MG 24 hr tablet Take 50 mg by mouth at bedtime as needed (for HBP >130).     Marland Kitchen omeprazole (PRILOSEC) 20 MG capsule Take 1 capsule (20 mg total) by mouth 2 (two) times daily. 60 capsule 5  . phytonadione (VITAMIN K) 5 MG tablet Take 1 tablet (5 mg total) by mouth daily for 5 days. 5 tablet 0  . Podiatric Products (EUCERIN ADVANCED REPAIR) CREA Apply 1 application topically 2 (two) times daily.    Marland Kitchen spironolactone (ALDACTONE) 100 MG tablet Take 2 tablets (200 mg) by mouth every morning and 1 tablet (100mg ) by mouth in the evening (Patient taking differently: Take 100-200 mg by mouth See admin instructions. Take 200 mg by mouth in the morning and take 100 mg by mouth in the evening) 90 tablet 1  . tetrahydrozoline (VISINE) 0.05 % ophthalmic solution Place 2 drops into both eyes daily as needed (for dry eyes).     No current facility-administered medications for this visit.     REVIEW OF SYSTEMS:   Constitutional: ( - ) fevers, ( - )  chills , ( - ) night sweats Eyes: ( - ) blurriness of vision, ( - ) double vision, ( - ) watery eyes Ears, nose, mouth, throat, and face: ( - ) mucositis, ( - ) sore throat Respiratory: ( - ) cough, ( - ) dyspnea, ( - ) wheezes Cardiovascular: ( - ) palpitation, ( - ) chest  discomfort, ( - ) lower extremity swelling Gastrointestinal:  ( - )  nausea, ( - ) heartburn, ( - ) change in bowel habits Skin: ( - ) abnormal skin rashes Lymphatics: ( - ) new lymphadenopathy, ( - ) easy bruising Neurological: ( - ) numbness, ( - ) tingling, ( - ) new weaknesses Behavioral/Psych: ( - ) mood change, ( - ) new changes  All other systems were reviewed with the patient and are negative.  PHYSICAL EXAMINATION: ECOG PERFORMANCE STATUS: 1 - Symptomatic but completely ambulatory  Vitals:   07/16/18 0814  BP: (!) 115/52  Pulse: 61  Resp: 20  Temp: 98 F (36.7 C)  SpO2: 100%   Filed Weights   07/16/18 0814  Weight: 189 lb 4 oz (85.8 kg)    GENERAL: alert, no distress and comfortable SKIN: skin color, texture, turgor are normal, no rashes or significant lesions EYES: conjunctiva are pink and non-injected, sclera clear OROPHARYNX: no exudate, no erythema; lips, buccal mucosa, and tongue normal  NECK: supple, non-tender LUNGS: clear to auscultation with normal breathing effort HEART: regular rate & rhythm, no murmurs, no lower extremity edema ABDOMEN: soft, non-tender, moderately distended with ascites present, normal bowel sounds Musculoskeletal: no cyanosis of digits and no clubbing  PSYCH: alert & oriented x 3, fluent speech NEURO: no focal motor/sensory deficits  LABORATORY DATA:  I have reviewed the data as listed Lab Results  Component Value Date   WBC 3.8 (L) 07/16/2018   HGB 13.3 07/16/2018   HCT 38.6 (L) 07/16/2018   MCV 91.7 07/16/2018   PLT 160 07/16/2018   Lab Results  Component Value Date   NA 126 (L) 07/16/2018   K 4.4 07/16/2018   CL 89 (L) 07/16/2018   CO2 29 07/16/2018    RADIOGRAPHIC STUDIES: I have personally reviewed the radiological images as listed and agreed with the findings in the report. 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 EXAM: NUCLEAR MEDICINE PET SKULL BASE TO THIGH TECHNIQUE: 9.72 mCi F-18 FDG was injected intravenously. Full-ring PET imaging was performed from the skull base to thigh after the radiotracer. CT data was obtained and used for attenuation correction and anatomic localization. Fasting blood glucose: 130 mg/dl COMPARISON:  CT chest 05/18/2018 FINDINGS: Mediastinal blood pool activity: SUV max 2.67 NECK: No hypermetabolic lymph nodes in the neck. Incidental CT findings: none CHEST: No hypermetabolic axillary or supraclavicular lymph nodes. No mediastinal or hilar hypermetabolic lymph nodes identified. The part solid nodule within the right lower lobe is again identified. The 1.3 cm part solid nodule within the right lower lobe is again identified and exhibits low level FDG uptake within SUV max of 1.13. This is not significantly changed in size from 05/18/2018. It is worth mentioning, that on remote exam from 02/07/2013 this nodule was present, appeared predominantly solid measuring 5.4 mm. The part solid nodule within the medial left upper lobe measures 1.2 cm and has an SUV max of 0.99. This nodule has also gradually increased in size in the interval. No additional pulmonary nodules identified. Incidental CT findings: Again seen is diffuse pericardial calcifications which may be related to prior infection. Aortic atherosclerosis noted. Three vessel coronary artery atherosclerotic calcifications. Small to moderate bilateral pleural effusions and gynecomastia noted. ABDOMEN/PELVIS: No abnormal uptake identified within the liver, pancreas, spleen. Unchanged small nodule in the left adrenal gland measuring 1.3 cm. This is stable from 02/07/2013 and is compatible with a benign adenoma. No hypermetabolic lymph nodes within the  abdomen or pelvis. Incidental CT findings: The liver is shrunken, nodular compatible with cirrhosis. Upper abdominal varicosities are identified. Moderate to large volume of abdominal ascites.  Prominent mesenteric lymph nodes without significant FDG uptake are noted. Nonspecific in the setting of cirrhosis. SKELETON: No focal hypermetabolic activity to suggest skeletal metastasis. Incidental CT findings: none IMPRESSION: 1. There is very low level radiotracer uptake associated with the left upper lobe part solid nodule. Low level FDG uptake is also associated with the right lower lobe part solid nodule. 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 Small pulmonary adenocarcinomas remain a possibility. 2. Morphologic features of the liver compatible with cirrhosis. Stigmata of portal venous hypertension is noted including ascites and abdominal varices. 3. Aortic atherosclerosis and coronary artery atherosclerotic calcifications. 4. Bilateral pleural effusions 5. Pericardial calcifications Aortic Atherosclerosis (ICD10-I70.0). Electronically Signed   By: Kerby Moors M.D.   On: 06/24/2018 14:22   Ir Radiologist Eval & Mgmt  Result Date: 06/22/2018 Please refer to notes tab for details about interventional procedure. (Op Note)  US Abdomen Limited Ruq  Result Date: 06/16/2018 CLINICAL DATA:  Alcoholic cirrhosis.  [place] screening. EXAM: ULTRASOUND ABDOMEN LIMITED RIGHT UPPER QUADRANT COMPARISON:  Abdominal ultrasound dated May 22, 2015. FINDINGS: Gallbladder: No gallstones visualized. Mild circumferential bladder wall thickening, likely related to underlying liver disease and contracted state. No sonographic Murphy sign noted by sonographer. Common bile duct: Diameter: 2 mm, normal. Liver: No focal lesion identified. Nodular contour with coarsened parenchymal echogenicity. Portal vein is patent on color Doppler imaging with normal direction of blood flow towards the liver. Right upper quadrant ascites. IMPRESSION: 1. Cirrhosis with ascites.  No focal liver lesion. Electronically Signed   By: Titus Dubin M.D.   On: 06/16/2018 16:29   Ir Paracentesis  Result Date:  06/16/2018 INDICATION: Patient with history of alcoholic cirrhosis, recurrent ascites. Request made for therapeutic paracentesis. EXAM: ULTRASOUND GUIDED THERAPEUTIC PARACENTESIS MEDICATIONS: None COMPLICATIONS: None immediate. PROCEDURE: Informed written consent was obtained from the patient after a discussion of the risks, benefits and alternatives to treatment. A timeout was performed prior to the initiation of the procedure. Initial ultrasound scanning demonstrates a large amount of ascites within the left lower abdominal quadrant. The left lower abdomen was prepped and draped in the usual sterile fashion. 1% lidocaine was used for local anesthesia. Following this, a 19 gauge, 10-cm, Yueh catheter was introduced. An ultrasound image was saved for documentation purposes. The paracentesis was performed. The catheter was removed and a dressing was applied. The patient tolerated the procedure well without immediate post procedural complication. FINDINGS: A total of approximately 4.2 liters of hazy, yellow fluid was removed. IMPRESSION: Successful ultrasound-guided therapeutic paracentesis yielding 4.2 liters of peritoneal fluid. Read by: Rowe Robert, PA-C Electronically Signed   By: Lucrezia Europe M.D.   On: 06/16/2018 12:57    PATHOLOGY: I have reviewed the pathology reports as documented in the oncologist history.

## 2018-07-13 NOTE — Telephone Encounter (Signed)
PT would like to cancelled his CT at [facility] that is sch on 07-20-18.

## 2018-07-15 ENCOUNTER — Other Ambulatory Visit: Payer: Self-pay | Admitting: Hematology

## 2018-07-15 DIAGNOSIS — D689 Coagulation defect, unspecified: Secondary | ICD-10-CM | POA: Insufficient documentation

## 2018-07-16 ENCOUNTER — Inpatient Hospital Stay: Payer: Medicare HMO

## 2018-07-16 ENCOUNTER — Inpatient Hospital Stay: Payer: Medicare HMO | Attending: Hematology | Admitting: Hematology

## 2018-07-16 ENCOUNTER — Other Ambulatory Visit: Payer: Self-pay

## 2018-07-16 ENCOUNTER — Encounter: Payer: Self-pay | Admitting: Hematology

## 2018-07-16 VITALS — BP 115/52 | HR 61 | Temp 98.0°F | Resp 20 | Ht 67.0 in | Wt 189.2 lb

## 2018-07-16 DIAGNOSIS — K7031 Alcoholic cirrhosis of liver with ascites: Secondary | ICD-10-CM

## 2018-07-16 DIAGNOSIS — E871 Hypo-osmolality and hyponatremia: Secondary | ICD-10-CM | POA: Diagnosis not present

## 2018-07-16 DIAGNOSIS — R918 Other nonspecific abnormal finding of lung field: Secondary | ICD-10-CM | POA: Diagnosis not present

## 2018-07-16 DIAGNOSIS — D72819 Decreased white blood cell count, unspecified: Secondary | ICD-10-CM | POA: Diagnosis not present

## 2018-07-16 DIAGNOSIS — D689 Coagulation defect, unspecified: Secondary | ICD-10-CM

## 2018-07-16 DIAGNOSIS — R911 Solitary pulmonary nodule: Secondary | ICD-10-CM

## 2018-07-16 LAB — CMP (CANCER CENTER ONLY)
ALT: 11 U/L (ref 0–44)
AST: 19 U/L (ref 15–41)
Albumin: 4.1 g/dL (ref 3.5–5.0)
Alkaline Phosphatase: 137 U/L — ABNORMAL HIGH (ref 38–126)
Anion gap: 8 (ref 5–15)
BUN: 25 mg/dL — ABNORMAL HIGH (ref 8–23)
CALCIUM: 9.4 mg/dL (ref 8.9–10.3)
CO2: 29 mmol/L (ref 22–32)
Chloride: 89 mmol/L — ABNORMAL LOW (ref 98–111)
Creatinine: 1.15 mg/dL (ref 0.61–1.24)
GFR, Est AFR Am: >60 mL/min (ref >60)
GFR, Estimated: >60 mL/min (ref >60)
Glucose, Bld: 141 mg/dL — ABNORMAL HIGH (ref 70–99)
Potassium: 4.4 mmol/L (ref 3.5–5.1)
Sodium: 126 mmol/L — ABNORMAL LOW (ref 135–145)
Total Bilirubin: 1.5 mg/dL — ABNORMAL HIGH (ref 0.3–1.2)
Total Protein: 7.8 g/dL (ref 6.5–8.1)

## 2018-07-16 LAB — D-DIMER, QUANTITATIVE: D-Dimer, Quant: 3.66 ug/mL-FEU — ABNORMAL HIGH (ref 0.00–0.50)

## 2018-07-16 LAB — PROTIME-INR
INR: 1.2 (ref 0.8–1.2)
Prothrombin Time: 15.4 seconds — ABNORMAL HIGH (ref 11.4–15.2)

## 2018-07-16 LAB — CBC WITH DIFFERENTIAL (CANCER CENTER ONLY)
Abs Immature Granulocytes: 0.02 10*3/uL (ref 0.00–0.07)
Basophils Absolute: 0 10*3/uL (ref 0.0–0.1)
Basophils Relative: 1 %
Eosinophils Absolute: 0.1 10*3/uL (ref 0.0–0.5)
Eosinophils Relative: 2 %
HCT: 38.6 % — ABNORMAL LOW (ref 39.0–52.0)
Hemoglobin: 13.3 g/dL (ref 13.0–17.0)
Immature Granulocytes: 1 %
Lymphocytes Relative: 16 %
Lymphs Abs: 0.6 10*3/uL — ABNORMAL LOW (ref 0.7–4.0)
MCH: 31.6 pg (ref 26.0–34.0)
MCHC: 34.5 g/dL (ref 30.0–36.0)
MCV: 91.7 fL (ref 80.0–100.0)
Monocytes Absolute: 0.6 10*3/uL (ref 0.1–1.0)
Monocytes Relative: 15 %
Neutro Abs: 2.5 10*3/uL (ref 1.7–7.7)
Neutrophils Relative %: 65 %
Platelet Count: 160 10*3/uL (ref 150–400)
RBC: 4.21 MIL/uL — ABNORMAL LOW (ref 4.22–5.81)
RDW: 14.9 % (ref 11.5–15.5)
WBC Count: 3.8 10*3/uL — ABNORMAL LOW (ref 4.0–10.5)
nRBC: 0 % (ref 0.0–0.2)

## 2018-07-16 LAB — FIBRINOGEN: Fibrinogen: 388 mg/dL (ref 210–475)

## 2018-07-16 LAB — APTT: aPTT: 39 seconds — ABNORMAL HIGH (ref 24–36)

## 2018-07-16 MED ORDER — PHYTONADIONE 5 MG PO TABS: 5.0000 mg | ORAL_TABLET | Freq: Every day | ORAL | 0 refills | Status: AC

## 2018-07-20 ENCOUNTER — Telehealth: Payer: Self-pay | Admitting: *Deleted

## 2018-07-20 ENCOUNTER — Other Ambulatory Visit: Payer: Medicare HMO

## 2018-07-20 ENCOUNTER — Inpatient Hospital Stay (HOSPITAL_COMMUNITY): Admission: RE | Admit: 2018-07-20 | Payer: Medicare HMO | Source: Ambulatory Visit

## 2018-07-20 ENCOUNTER — Encounter (HOSPITAL_COMMUNITY): Payer: Self-pay

## 2018-07-20 ENCOUNTER — Other Ambulatory Visit: Payer: Self-pay | Admitting: *Deleted

## 2018-07-20 DIAGNOSIS — R918 Other nonspecific abnormal finding of lung field: Secondary | ICD-10-CM

## 2018-07-20 NOTE — Telephone Encounter (Signed)
Returned patient's phone call regarding Vitamin K. Patient stated," I can only find Vitamin K on [facility]. My insurance doesn't cover it. Is this necessary prior to surgery? Per Dr. Maylon Peppers, it's OK if he doesn't take Vitamin K before the surgery. He verbalized understanding.

## 2018-07-22 ENCOUNTER — Other Ambulatory Visit: Payer: Self-pay | Admitting: Internal Medicine

## 2018-07-22 ENCOUNTER — Other Ambulatory Visit: Payer: Self-pay

## 2018-07-22 ENCOUNTER — Encounter (HOSPITAL_COMMUNITY): Payer: Self-pay | Admitting: *Deleted

## 2018-07-22 ENCOUNTER — Other Ambulatory Visit (HOSPITAL_COMMUNITY): Payer: Self-pay | Admitting: Internal Medicine

## 2018-07-22 DIAGNOSIS — K7460 Unspecified cirrhosis of liver: Secondary | ICD-10-CM

## 2018-07-22 NOTE — Progress Notes (Signed)
Pt denies SOB, chest pain, and being under the care of a cardiologist. Pt denies having a stress test and cardiac cath. Pt denies recent labs. Pt made aware to stop taking vitamins, fish oil and herbal medications. Do not take any NSAIDs ie: Ibuprofen, Advil, Naproxen (ALeve), Motrin, BC and Goody Powder. Pt stated that he does not take medication for diabetes and he will not check his blood sugar on morning of surgery. Pt verbalized understanding of all pre-op instructions. PA, Anesthesiology, asked to review pt updated history.

## 2018-07-23 ENCOUNTER — Encounter (HOSPITAL_COMMUNITY): Payer: Self-pay | Admitting: Physician Assistant

## 2018-07-23 ENCOUNTER — Ambulatory Visit (HOSPITAL_COMMUNITY)
Admission: RE | Admit: 2018-07-23 | Discharge: 2018-07-23 | Disposition: A | Discharge status: Home / Self Care | Payer: Medicare HMO | Source: Ambulatory Visit | Attending: Internal Medicine | Admitting: Internal Medicine

## 2018-07-23 DIAGNOSIS — K7031 Alcoholic cirrhosis of liver with ascites: Secondary | ICD-10-CM | POA: Diagnosis not present

## 2018-07-23 DIAGNOSIS — K7460 Unspecified cirrhosis of liver: Secondary | ICD-10-CM

## 2018-07-23 HISTORY — PX: IR PARACENTESIS: IMG2679

## 2018-07-23 MED ORDER — LIDOCAINE HCL 1 % IJ SOLN
INTRAMUSCULAR | Status: DC | PRN
  Administered 2018-07-23: 10 mL

## 2018-07-23 MED ORDER — LIDOCAINE HCL 1 % IJ SOLN
INTRAMUSCULAR | Status: AC
  Filled 2018-07-23: qty 20

## 2018-07-23 NOTE — Procedures (Signed)
PROCEDURE SUMMARY:  Successful image-guided paracentesis from the right lower abdomen.  Yielded 1.2 liters of hazy yellow fluid.  No immediate complications.  EBL: zero Patient tolerated well.   Specimen was not sent for labs.  Please see imaging section of Epic for full dictation.  Joaquim Nam PA-C 07/23/2018 3:06 PM

## 2018-07-23 NOTE — Anesthesia Preprocedure Evaluation (Addendum)
Anesthesia Evaluation  Patient identified by MRN, date of birth, ID band Patient awake    Reviewed: Allergy & Precautions, NPO status , Patient's Chart, lab work & pertinent test results, reviewed documented beta blocker date and time   Airway Mallampati: II  TM Distance: >3 FB Neck ROM: Full    Dental  (+) Dental Advisory Given   Pulmonary pneumonia,    Pulmonary exam normal breath sounds clear to auscultation       Cardiovascular hypertension, Pt. on medications and Pt. on home beta blockers Normal cardiovascular exam Rhythm:Regular Rate:Normal     Neuro/Psych PSYCHIATRIC DISORDERS Anxiety Depression negative neurological ROS     GI/Hepatic Neg liver ROS, hiatal hernia, GERD  ,  Endo/Other  diabetes  Renal/GU Renal disease     Musculoskeletal negative musculoskeletal ROS (+)   Abdominal (+) + obese,   Peds  Hematology  (+) Blood dyscrasia, anemia ,   Anesthesia Other Findings   Reproductive/Obstetrics negative OB ROS                            Anesthesia Physical Anesthesia Plan  ASA: III  Anesthesia Plan: General   Post-op Pain Management:    Induction: Intravenous  PONV Risk Score and Plan: 3 and Ondansetron, Dexamethasone, Midazolam and Treatment may vary due to age or medical condition  Airway Management Planned: Oral ETT  Additional Equipment: None  Intra-op Plan:   Post-operative Plan: Extubation in OR  Informed Consent: I have reviewed the patients History and Physical, chart, labs and discussed the procedure including the risks, benefits and alternatives for the proposed anesthesia with the patient or authorized representative who has indicated his/her understanding and acceptance.     Dental advisory given  Plan Discussed with: CRNA  Anesthesia Plan Comments: (See previous note by Karoline Caldwell, PA-C 07/06/18. Case was postponed by Dr. Roxan Hockey as he wanted pt  to have eval for coagulopathy. Seen by heme/onc 07/16/18. Per note "In light of the patient's longstanding decompensated cirrhosis, the coagulopathy is most likely secondary to cirrhosis. In patients with established cirrhosis, Child Pugh classification and MELD score may be helpful in in estimating operative risk; surgery is generally permissible in patients with cirrhosis and CP Class B and MELD score of 10 to 15. I explained to the patient that in the setting of his longstanding cirrhosis, his risk of bleeding is generally higher than average risk patient; however, given that he is Class B by Child Pugh classification and his MELD score is 12, the risk of perioperative bleeding does not appear prohibitive; consultation with anesthesia may be helpful, such as TEG. FFP or cryoprecipitate is not routinely recommended to correct coagulopathy prior to a procedure, as several large reviews of available evidence have shown no clinical benefits, and plasma products can increase the risk of transfusion and volume overload. As patients with cirrhosis can develop concurrent nutritional deficiencies, I have prescribed oral Vitamin K 5mg  daily x 5 days, which may help improve coagulopathy. No further work-up indicated from a coagulopathy standpoint.")       Anesthesia Quick Evaluation

## 2018-07-26 ENCOUNTER — Other Ambulatory Visit: Payer: Self-pay

## 2018-07-26 ENCOUNTER — Ambulatory Visit (HOSPITAL_COMMUNITY): Payer: Medicare HMO

## 2018-07-26 ENCOUNTER — Ambulatory Visit (HOSPITAL_COMMUNITY): Payer: Medicare HMO | Admitting: Anesthesiology

## 2018-07-26 ENCOUNTER — Ambulatory Visit (HOSPITAL_COMMUNITY)
Admission: RE | Admit: 2018-07-26 | Discharge: 2018-07-26 | Disposition: A | Discharge status: Home / Self Care | Payer: Medicare HMO | Attending: Thoracic Surgery (Cardiothoracic Vascular Surgery) | Admitting: Thoracic Surgery (Cardiothoracic Vascular Surgery)

## 2018-07-26 ENCOUNTER — Ambulatory Visit (HOSPITAL_COMMUNITY): Payer: Medicare HMO | Admitting: Physician Assistant

## 2018-07-26 ENCOUNTER — Encounter (HOSPITAL_COMMUNITY)
Admission: RE | Disposition: A | Discharge status: Home / Self Care | Payer: Self-pay | Source: Home / Self Care | Attending: Thoracic Surgery (Cardiothoracic Vascular Surgery)

## 2018-07-26 ENCOUNTER — Encounter (HOSPITAL_COMMUNITY): Payer: Self-pay

## 2018-07-26 DIAGNOSIS — J90 Pleural effusion, not elsewhere classified: Secondary | ICD-10-CM | POA: Diagnosis not present

## 2018-07-26 DIAGNOSIS — J9811 Atelectasis: Secondary | ICD-10-CM | POA: Diagnosis not present

## 2018-07-26 DIAGNOSIS — I2510 Atherosclerotic heart disease of native coronary artery without angina pectoris: Secondary | ICD-10-CM | POA: Diagnosis not present

## 2018-07-26 DIAGNOSIS — F1021 Alcohol dependence, in remission: Secondary | ICD-10-CM | POA: Diagnosis not present

## 2018-07-26 DIAGNOSIS — Z419 Encounter for procedure for purposes other than remedying health state, unspecified: Secondary | ICD-10-CM

## 2018-07-26 DIAGNOSIS — R918 Other nonspecific abnormal finding of lung field: Secondary | ICD-10-CM | POA: Diagnosis not present

## 2018-07-26 DIAGNOSIS — K219 Gastro-esophageal reflux disease without esophagitis: Secondary | ICD-10-CM | POA: Diagnosis not present

## 2018-07-26 DIAGNOSIS — I10 Essential (primary) hypertension: Secondary | ICD-10-CM | POA: Diagnosis not present

## 2018-07-26 DIAGNOSIS — E1122 Type 2 diabetes mellitus with diabetic chronic kidney disease: Secondary | ICD-10-CM | POA: Insufficient documentation

## 2018-07-26 DIAGNOSIS — Z882 Allergy status to sulfonamides status: Secondary | ICD-10-CM | POA: Diagnosis not present

## 2018-07-26 DIAGNOSIS — Z79899 Other long term (current) drug therapy: Secondary | ICD-10-CM | POA: Insufficient documentation

## 2018-07-26 DIAGNOSIS — N183 Chronic kidney disease, stage 3 (moderate): Secondary | ICD-10-CM | POA: Insufficient documentation

## 2018-07-26 DIAGNOSIS — Z9889 Other specified postprocedural states: Secondary | ICD-10-CM

## 2018-07-26 DIAGNOSIS — J9809 Other diseases of bronchus, not elsewhere classified: Secondary | ICD-10-CM | POA: Diagnosis not present

## 2018-07-26 DIAGNOSIS — K7031 Alcoholic cirrhosis of liver with ascites: Secondary | ICD-10-CM | POA: Insufficient documentation

## 2018-07-26 DIAGNOSIS — E119 Type 2 diabetes mellitus without complications: Secondary | ICD-10-CM | POA: Diagnosis not present

## 2018-07-26 DIAGNOSIS — J209 Acute bronchitis, unspecified: Secondary | ICD-10-CM | POA: Diagnosis not present

## 2018-07-26 DIAGNOSIS — I129 Hypertensive chronic kidney disease with stage 1 through stage 4 chronic kidney disease, or unspecified chronic kidney disease: Secondary | ICD-10-CM | POA: Diagnosis not present

## 2018-07-26 DIAGNOSIS — J189 Pneumonia, unspecified organism: Secondary | ICD-10-CM | POA: Diagnosis not present

## 2018-07-26 DIAGNOSIS — Z888 Allergy status to other drugs, medicaments and biological substances status: Secondary | ICD-10-CM | POA: Diagnosis not present

## 2018-07-26 DIAGNOSIS — Z01818 Encounter for other preprocedural examination: Secondary | ICD-10-CM | POA: Diagnosis not present

## 2018-07-26 DIAGNOSIS — R911 Solitary pulmonary nodule: Secondary | ICD-10-CM

## 2018-07-26 HISTORY — PX: FUDUCIAL PLACEMENT: SHX5083

## 2018-07-26 HISTORY — DX: Presence of external hearing-aid: Z97.4

## 2018-07-26 HISTORY — PX: VIDEO BRONCHOSCOPY WITH ENDOBRONCHIAL NAVIGATION: SHX6175

## 2018-07-26 HISTORY — DX: Major depressive disorder, single episode, unspecified: F32.9

## 2018-07-26 HISTORY — DX: Depression, unspecified: F32.A

## 2018-07-26 HISTORY — DX: Presence of spectacles and contact lenses: Z97.3

## 2018-07-26 HISTORY — DX: Post-traumatic stress disorder, unspecified: F43.10

## 2018-07-26 HISTORY — DX: Other nonspecific abnormal finding of lung field: R91.8

## 2018-07-26 HISTORY — DX: Presence of dental prosthetic device (complete) (partial): Z97.2

## 2018-07-26 LAB — COMPREHENSIVE METABOLIC PANEL
ALT: 18 U/L (ref 0–44)
AST: 24 U/L (ref 15–41)
Albumin: 3.8 g/dL (ref 3.5–5.0)
Alkaline Phosphatase: 137 U/L — ABNORMAL HIGH (ref 38–126)
Anion gap: 13 (ref 5–15)
BUN: 22 mg/dL (ref 8–23)
CO2: 25 mmol/L (ref 22–32)
Calcium: 9.6 mg/dL (ref 8.9–10.3)
Chloride: 87 mmol/L — ABNORMAL LOW (ref 98–111)
Creatinine, Ser: 1.15 mg/dL (ref 0.61–1.24)
GFR calc non Af Amer: >60 mL/min (ref >60)
Glucose, Bld: 139 mg/dL — ABNORMAL HIGH (ref 70–99)
Potassium: 4.1 mmol/L (ref 3.5–5.1)
Sodium: 125 mmol/L — ABNORMAL LOW (ref 135–145)
Total Bilirubin: 1.6 mg/dL — ABNORMAL HIGH (ref 0.3–1.2)
Total Protein: 8.4 g/dL — ABNORMAL HIGH (ref 6.5–8.1)

## 2018-07-26 LAB — CBC
HCT: 41.5 % (ref 39.0–52.0)
Hemoglobin: 14.1 g/dL (ref 13.0–17.0)
MCH: 30.9 pg (ref 26.0–34.0)
MCHC: 34 g/dL (ref 30.0–36.0)
MCV: 91 fL (ref 80.0–100.0)
Platelets: 170 10*3/uL (ref 150–400)
RBC: 4.56 MIL/uL (ref 4.22–5.81)
RDW: 15 % (ref 11.5–15.5)
WBC: 4.2 10*3/uL (ref 4.0–10.5)
nRBC: 0 % (ref 0.0–0.2)

## 2018-07-26 LAB — PROTIME-INR
INR: 1.2 (ref 0.8–1.2)
Prothrombin Time: 14.9 seconds (ref 11.4–15.2)

## 2018-07-26 LAB — APTT: aPTT: 37 seconds — ABNORMAL HIGH (ref 24–36)

## 2018-07-26 LAB — GLUCOSE, CAPILLARY: GLUCOSE-CAPILLARY: 144 mg/dL — AB (ref 70–99)

## 2018-07-26 SURGERY — VIDEO BRONCHOSCOPY WITH ENDOBRONCHIAL NAVIGATION
Anesthesia: General

## 2018-07-26 MED ORDER — LIDOCAINE 2% (20 MG/ML) 5 ML SYRINGE
INTRAMUSCULAR | Status: DC | PRN
  Administered 2018-07-26: 100 mg via INTRAVENOUS

## 2018-07-26 MED ORDER — SUGAMMADEX SODIUM 500 MG/5ML IV SOLN
INTRAVENOUS | Status: DC | PRN
  Administered 2018-07-26: 200 mg via INTRAVENOUS

## 2018-07-26 MED ORDER — LACTATED RINGERS IV SOLN
INTRAVENOUS | Status: DC
  Administered 2018-07-26: 09:00:00 via INTRAVENOUS

## 2018-07-26 MED ORDER — PROMETHAZINE HCL 25 MG/ML IJ SOLN: 6.2500 mg | INTRAMUSCULAR | Status: DC | PRN

## 2018-07-26 MED ORDER — FENTANYL CITRATE (PF) 100 MCG/2ML IJ SOLN: 25.0000 ug | INTRAMUSCULAR | Status: DC | PRN

## 2018-07-26 MED ORDER — FENTANYL CITRATE (PF) 250 MCG/5ML IJ SOLN
INTRAMUSCULAR | Status: AC
  Filled 2018-07-26: qty 5

## 2018-07-26 MED ORDER — MIDAZOLAM HCL 5 MG/5ML IJ SOLN
INTRAMUSCULAR | Status: DC | PRN
  Administered 2018-07-26: 2 mg via INTRAVENOUS

## 2018-07-26 MED ORDER — MIDAZOLAM HCL 2 MG/2ML IJ SOLN
INTRAMUSCULAR | Status: AC
  Filled 2018-07-26: qty 2

## 2018-07-26 MED ORDER — DEXAMETHASONE SODIUM PHOSPHATE 10 MG/ML IJ SOLN
INTRAMUSCULAR | Status: DC | PRN
  Administered 2018-07-26: 10 mg via INTRAVENOUS

## 2018-07-26 MED ORDER — 0.9 % SODIUM CHLORIDE (POUR BTL) OPTIME
TOPICAL | Status: DC | PRN
  Administered 2018-07-26: 1000 mL

## 2018-07-26 MED ORDER — ONDANSETRON HCL 4 MG/2ML IJ SOLN
INTRAMUSCULAR | Status: DC | PRN
  Administered 2018-07-26: 4 mg via INTRAVENOUS

## 2018-07-26 MED ORDER — SODIUM CHLORIDE 0.9 % IV SOLN
INTRAVENOUS | Status: DC | PRN
  Administered 2018-07-26: 50 ug/min via INTRAVENOUS

## 2018-07-26 MED ORDER — PROPOFOL 10 MG/ML IV BOLUS
INTRAVENOUS | Status: DC | PRN
  Administered 2018-07-26: 120 mg via INTRAVENOUS

## 2018-07-26 MED ORDER — EPINEPHRINE PF 1 MG/ML IJ SOLN
INTRAMUSCULAR | Status: AC
  Filled 2018-07-26: qty 1

## 2018-07-26 MED ORDER — PROPOFOL 10 MG/ML IV BOLUS
INTRAVENOUS | Status: AC
  Filled 2018-07-26: qty 20

## 2018-07-26 MED ORDER — MEPERIDINE HCL 50 MG/ML IJ SOLN: 6.2500 mg | INTRAMUSCULAR | Status: DC | PRN

## 2018-07-26 MED ORDER — ROCURONIUM BROMIDE 50 MG/5ML IV SOSY
PREFILLED_SYRINGE | INTRAVENOUS | Status: DC | PRN
  Administered 2018-07-26: 40 mg via INTRAVENOUS
  Administered 2018-07-26: 10 mg via INTRAVENOUS

## 2018-07-26 MED ORDER — FENTANYL CITRATE (PF) 100 MCG/2ML IJ SOLN
INTRAMUSCULAR | Status: DC | PRN
  Administered 2018-07-26: 50 ug via INTRAVENOUS
  Administered 2018-07-26: 25 ug via INTRAVENOUS
  Administered 2018-07-26: 100 ug via INTRAVENOUS
  Administered 2018-07-26: 25 ug via INTRAVENOUS

## 2018-07-26 MED ORDER — LACTATED RINGERS IV SOLN
INTRAVENOUS | Status: DC | PRN
  Administered 2018-07-26 (×2): via INTRAVENOUS

## 2018-07-26 MED ORDER — EPINEPHRINE PF 1 MG/ML IJ SOLN
INTRAMUSCULAR | Status: DC | PRN
  Administered 2018-07-26: 1 mg

## 2018-07-26 SURGICAL SUPPLY — 45 items
ADAPTER BRONCHOSCOPE OLYMPUS (ADAPTER) ×3 IMPLANT
ADAPTER VALVE BIOPSY EBUS (MISCELLANEOUS) IMPLANT
ADPTR VALVE BIOPSY EBUS (MISCELLANEOUS)
BRUSH BIOPSY BRONCH 10 SDTNB (MISCELLANEOUS) ×2 IMPLANT
BRUSH BIOPSY BRONCH 10MM SDTNB (MISCELLANEOUS) ×1
BRUSH SUPERTRAX BIOPSY (INSTRUMENTS) IMPLANT
BRUSH SUPERTRAX NDL-TIP CYTO (INSTRUMENTS) ×6 IMPLANT
CANISTER SUCT 3000ML PPV (MISCELLANEOUS) ×3 IMPLANT
CHANNEL WORK EXTEND EDGE 180 (KITS) IMPLANT
CHANNEL WORK EXTEND EDGE 90 (KITS) IMPLANT
CONT SPEC 4OZ CLIKSEAL STRL BL (MISCELLANEOUS) ×18 IMPLANT
COVER BACK TABLE 60X90IN (DRAPES) ×3 IMPLANT
COVER WAND RF STERILE (DRAPES) IMPLANT
FILTER STRAW FLUID ASPIR (MISCELLANEOUS) IMPLANT
FORCEPS BIOP SUPERTRX PREMAR (INSTRUMENTS) ×3 IMPLANT
GAUZE SPONGE 4X4 12PLY STRL (GAUZE/BANDAGES/DRESSINGS) ×3 IMPLANT
GLOVE SURG SIGNA 7.5 PF LTX (GLOVE) ×3 IMPLANT
GOWN STRL REUS W/ TWL XL LVL3 (GOWN DISPOSABLE) ×1 IMPLANT
GOWN STRL REUS W/TWL XL LVL3 (GOWN DISPOSABLE) ×2
KIT CLEAN ENDO COMPLIANCE (KITS) ×3 IMPLANT
KIT MARKER FIDUCIAL DELIVERY (KITS) ×6 IMPLANT
KIT PROCEDURE EDGE 180 (KITS) ×3 IMPLANT
KIT PROCEDURE EDGE 90 (KITS) IMPLANT
KIT TURNOVER KIT B (KITS) ×3 IMPLANT
MARKER FIDUCIAL SL NIT COIL (Implant Marker) ×18 IMPLANT
MARKER SKIN DUAL TIP RULER LAB (MISCELLANEOUS) ×3 IMPLANT
NEEDLE SUPERTRX PREMARK BIOPSY (NEEDLE) ×3 IMPLANT
NS IRRIG 1000ML POUR BTL (IV SOLUTION) ×3 IMPLANT
OIL SILICONE PENTAX (PARTS (SERVICE/REPAIRS)) ×3 IMPLANT
PAD ARMBOARD 7.5X6 YLW CONV (MISCELLANEOUS) ×6 IMPLANT
PATCHES PATIENT (LABEL) ×9 IMPLANT
SYR 20CC LL (SYRINGE) ×3 IMPLANT
SYR 20ML ECCENTRIC (SYRINGE) ×6 IMPLANT
SYR 30ML LL (SYRINGE) ×3 IMPLANT
SYR 5ML LL (SYRINGE) ×3 IMPLANT
TOWEL GREEN STERILE (TOWEL DISPOSABLE) ×3 IMPLANT
TOWEL GREEN STERILE FF (TOWEL DISPOSABLE) ×3 IMPLANT
TRAP SPECIMEN MUCOUS 40CC (MISCELLANEOUS) ×3 IMPLANT
TUBE CONNECTING 20'X1/4 (TUBING) ×2
TUBE CONNECTING 20X1/4 (TUBING) ×4 IMPLANT
UNDERPAD 30X30 (UNDERPADS AND DIAPERS) ×3 IMPLANT
VALVE BIOPSY  SINGLE USE (MISCELLANEOUS) ×4
VALVE BIOPSY SINGLE USE (MISCELLANEOUS) ×2 IMPLANT
VALVE SUCTION BRONCHIO DISP (MISCELLANEOUS) ×3 IMPLANT
WATER STERILE IRR 1000ML POUR (IV SOLUTION) ×3 IMPLANT

## 2018-07-26 NOTE — Anesthesia Postprocedure Evaluation (Signed)
Anesthesia Post Note  Patient: [name]  Procedure(s) Performed: VIDEO BRONCHOSCOPY WITH ENDOBRONCHIAL NAVIGATION (N/A ) PLACEMENT OF FUDUCIAL (N/A )     Patient location during evaluation: PACU Anesthesia Type: General Level of consciousness: sedated and patient cooperative Pain management: pain level controlled Vital Signs Assessment: post-procedure vital signs reviewed and stable Respiratory status: spontaneous breathing Cardiovascular status: stable Anesthetic complications: no    Last Vitals:  Vitals:   07/26/18 1336 07/26/18 1347  BP: 121/70 131/75  Pulse: 65 65  Resp: 15 16  Temp: 36.6 C   SpO2: 100% 100%    Last Pain:  Vitals:   07/26/18 1336  TempSrc:   PainSc: 0-No pain                 Nolon Nations

## 2018-07-26 NOTE — Transfer of Care (Signed)
Immediate Anesthesia Transfer of Care Note  Patient: [name]  Procedure(s) Performed: VIDEO BRONCHOSCOPY WITH ENDOBRONCHIAL NAVIGATION (N/A ) PLACEMENT OF FUDUCIAL (N/A )  Patient Location: PACU  Anesthesia Type:General  Level of Consciousness: awake, alert  and oriented  Airway & Oxygen Therapy: Patient Spontanous Breathing and Patient connected to face mask oxygen  Post-op Assessment: Report given to RN, Post -op Vital signs reviewed and stable and Patient moving all extremities X 4  Post vital signs: Reviewed and stable  Last Vitals:  Vitals Value Taken Time  BP 135/80 07/26/2018 12:37 PM  Temp    Pulse 68 07/26/2018 12:38 PM  Resp 19 07/26/2018 12:38 PM  SpO2 100 % 07/26/2018 12:38 PM  Vitals shown include unvalidated device data.  Last Pain:  Vitals:   07/26/18 0846  TempSrc:   PainSc: 0-No pain         Complications: No apparent anesthesia complications

## 2018-07-26 NOTE — Brief Op Note (Signed)
07/26/2018  12:53 PM  PATIENT:  [name]  78 y.o. male  PRE-OPERATIVE DIAGNOSIS:  BILATERAL LUNG NODULES  POST-OPERATIVE DIAGNOSIS:  BILATERAL LUNG NODULES  PROCEDURE: ELECTROMAGNETIC NAVIGATIONAL BRONCHOSCOPY WITH BRUSHINGS, NEEDLE ASPIRATIONS AND TRANSBRONCHIAL BIOPSIES OF LEFT UPPER AND RIGHT LOWER LOBE NODULES, FIDUCIAL PLACEMENT  SURGEON:  Surgeon(s) and Role:    [name], MD - Primary  PHYSICIAN ASSISTANT:   ASSISTANTS: none   ANESTHESIA:   general  EBL:  10 mL   BLOOD ADMINISTERED:none  DRAINS: none   LOCAL MEDICATIONS USED:  NONE  SPECIMEN:  Source of Specimen:  LUL AND RLL NODULES  DISPOSITION OF SPECIMEN:  PATHOLOGY  COUNTS:  NO endoscopic  TOURNIQUET:  * No tourniquets in log *  DICTATION: .Other Dictation: Dictation Number -  PLAN OF CARE: Discharge to home after PACU  PATIENT DISPOSITION:  PACU - hemodynamically stable.   Delay start of Pharmacological VTE agent (>24hrs) due to surgical blood loss or risk of bleeding: not applicable

## 2018-07-26 NOTE — Discharge Instructions (Addendum)
Do not drive or engage in heavy physical activity for 24 hours  You may resume normal activities tomorrow  You may cough up small amounts of blood over the next few days  Call [phone] if you cough up more than 2 tablespoons of blood, have a fever > 101 F or develop chest pain or shortness of breath  You may use acetaminophen (tylenol) if needed for discomfort- do not exceed 3000 mg in 24 hours  You may use an over the counter cough medication if needed   My office will contact you with follow up information

## 2018-07-26 NOTE — Interval H&P Note (Signed)
History and Physical Interval Note:  07/26/2018 9:43 AM  [name]  has presented today for surgery, with the diagnosis of BILATERAL LUNG NODULES.  The various methods of treatment have been discussed with the patient and family. After consideration of risks, benefits and other options for treatment, the patient has consented to  Procedure(s): [place] (N/A) PLACEMENT OF FUDUCIAL (N/A) as a surgical intervention.  The patient's history has been reviewed, patient examined, no change in status, stable for surgery.  I have reviewed the patient's chart and labs.  Questions were answered to the patient's satisfaction.     [name]

## 2018-07-26 NOTE — Anesthesia Procedure Notes (Signed)
Procedure Name: Intubation Date/Time: 07/26/2018 10:48 AM Performed by: Neldon Newport, CRNA Pre-anesthesia Checklist: Timeout performed, Patient being monitored, Emergency Drugs available, Suction available and Patient identified Patient Re-evaluated:Patient Re-evaluated prior to induction Oxygen Delivery Method: Circle system utilized Preoxygenation: Pre-oxygenation with 100% oxygen Induction Type: IV induction Ventilation: Mask ventilation without difficulty Laryngoscope Size: Mac and 4 Grade View: Grade I Tube type: Oral Tube size: 8.5 mm Number of attempts: 1 Placement Confirmation: breath sounds checked- equal and bilateral,  positive ETCO2 and ETT inserted through vocal cords under direct vision Secured at: 23 cm Tube secured with: Tape Dental Injury: Teeth and Oropharynx as per pre-operative assessment

## 2018-07-27 ENCOUNTER — Encounter (HOSPITAL_COMMUNITY): Payer: Self-pay | Admitting: Thoracic Surgery (Cardiothoracic Vascular Surgery)

## 2018-07-27 LAB — ACID FAST SMEAR (AFB, MYCOBACTERIA): Acid Fast Smear: NEGATIVE

## 2018-07-27 NOTE — Op Note (Signed)
NAME: [name], Kurt E. MEDICAL RECORD ON:[id number] ACCOUNT [id number] DATE OF BIRTH:May 11, 1941 FACILITY: [facility] LOCATION: [facility] PHYSICIAN:Omaree Fuqua C. Bridget Westbrooks, MD  OPERATIVE REPORT  DATE OF PROCEDURE:  07/26/2018  PREOPERATIVE DIAGNOSIS:  Bilateral lung nodules.  POSTOPERATIVE DIAGNOSIS:  Bilateral lung nodules.  PROCEDURE:  Electromagnetic navigational bronchoscopy with brushings, needle aspirations and transbronchial biopsies of left upper and right lower lobe nodules with fiducial placement.  SURGEON:  Modesto Charon, MD  ASSISTANT:  None.  ANESTHESIA:  General.  FINDINGS:  Initial preps on both nodules were nondiagnostic.  CLINICAL NOTE:  The patient is a 78 year old gentleman with a complex medical history who has had bilateral lung nodules dating back for about 2 years.  Recently, he was found to have a nodule that showed slight increase in the central solid component in  the right lower lobe.  The left upper lobe nodule was stable.  Both nodules were faintly hypermetabolic on PET CT.  He was offered the options of radiographic observation versus navigational bronchoscopy to attempt to biopsy.  The indications, risks,  benefits, and alternatives were discussed in detail with the patient.  He understood and accepted the risks and agreed to proceed.  DESCRIPTION OF PROCEDURE:  Preoperative planning with the Super D navigational computer was performed.  Both nodules were on relatively small airways adjacent to the larger airways.  It was felt possible to achieve diagnostic sampling.  The patient was  brought to the Operating Room, anesthetized and intubated.  Warming was then placed.  Sequential compression devices were placed for DVT prophylaxis.  A timeout was performed.  Flexible fiberoptic bronchoscopy was performed via the endotracheal tube.   It revealed normal endobronchial anatomy.  There were some thick clear secretions bilaterally.  The locatable guide for navigation  was placed.  Registration was performed.  There was good correlation of the video and virtual bronchoscopy.  The bronchoscope was directed to the left upper lobe bronchus and the appropriate segmental bronchus was cannulated.  The catheter was advanced to within approximately a centimeter of the nodule and then local registration was performed.  After  performing local registration, the catheter was repositioned and was in good proximity to the nodule within a centimeter and with good alignment.  Two needle aspirations were performed.  All sampling was performed with fluoroscopy.  Total fluoroscopy time  was 4.6 minutes.  Three samples then were obtained using a triple brush and finally multiple biopsies were taken.  The locatable guide was reinserted after every second to third sample was taken and the catheter was repositioned as necessary to maintain good  alignment and proximity.  The initial preps showed no evidence of cancer.  Another specimen was sent for AFB and fungal cultures.  The remainder was sent for permanent pathology.  The bronchoscope then was redirected to the right lower lobe bronchus and the appropriate subsegmental bronchus was cannulated.  Local registration was performed and the catheter then was repositioned into good alignment with the nodule.  The sampling  process was repeated using a needle brush instead of a triple brush on this occasion again.  Again, one biopsy was sent for AFB and fungal cultures.  The remainder was sent for permanent pathology.  Again, the quick prep showed no definite tumor cells.   The locatable guide then was directed back to the left upper lobe nodule and mapping was performed for fiducial placement.  Fiducials were placed as recommended by the computer.  The process then was repeated for the right lower lobe  nodule as well.  In  both cases, 3 fiducials were placed.  There was some bleeding, particularly after the final biopsies of the right lower lobe  nodule.  Dilute epinephrine was applied and there was no significant ongoing bleeding at the completion of the procedure.  The patient was extubated in the operating room and taken to the [facility] in good condition.  TN/NUANCE  D:07/26/2018 T:07/27/2018 JOB:[id number]

## 2018-07-31 LAB — AEROBIC/ANAEROBIC CULTURE (SURGICAL/DEEP WOUND)

## 2018-07-31 LAB — AEROBIC/ANAEROBIC CULTURE W GRAM STAIN (SURGICAL/DEEP WOUND)
Culture: NO GROWTH
Gram Stain: NONE SEEN

## 2018-08-02 ENCOUNTER — Other Ambulatory Visit: Payer: Self-pay

## 2018-08-03 ENCOUNTER — Ambulatory Visit: Payer: Medicare HMO | Admitting: Thoracic Surgery (Cardiothoracic Vascular Surgery)

## 2018-08-03 VITALS — BP 132/78 | HR 70 | Temp 97.9°F | Resp 20 | Ht 67.0 in | Wt 179.0 lb

## 2018-08-03 DIAGNOSIS — R918 Other nonspecific abnormal finding of lung field: Secondary | ICD-10-CM | POA: Diagnosis not present

## 2018-08-03 NOTE — Progress Notes (Signed)
[address]       [place] [address]             [phone]     HPI: Mr. Favata returns to discuss the results of his bronchoscopy.   Derran Sear is a 78 year old gentleman with a past medical history significant for ethanol abuse, decompensated cirrhosis, PTSD, atrial fibrillation and flutter status post ablation, type 2 diabetes, hypertension, hyperlipidemia, reflux, stage III chronic kidney disease, and bilateral lung nodules.  He was found to have lung nodules on a CT in February 2018.  He also had significant calcification of his pericardium.  A follow-up in August 2018 showed the nodules were essentially unchanged.  He then had a repeat CT on 05/18/2018.  There was no change in size but there did appear to be possibly some progression of the central solid component.  A PET/CT showed minimal uptake in a left upper lobe and right lower lobe nodule.  I did bronchoscopy with biopsy on 07/25/2017.  I also place fiducials for possible SBRT, as he is not an operative candidate.  He did well with the bronchoscopy.  He did cough up a little blood for a couple of days afterwards but that has resolved.  Past Medical History:  Diagnosis Date  . Adenomatous polyps 06/2004  . Agent orange exposure   . Alcoholic cirrhosis of liver with ascites ([place])   . Alcoholism ([place])   . Allergy   . Anxiety    PTSD- no meds  . Atrial fibrillation ([place])   . Atrial flutter ([place])    had ablation   . Chronic kidney disease    "beginnings of kidney failure"- 3-4 years ago  . Depression   . Diabetes mellitus without complication ([facility])    no meds  . Diverticulosis   . GERD (gastroesophageal reflux disease)   . Hemorrhoids   . Hiatal hernia   . Hx of hernia repair   . Hyperlipidemia   . Hypertension   . Iron deficiency anemia   . Lung nodules    bilateral  . Pedal edema   . Pneumonia    06-2016  . PTSD (post-traumatic stress disorder)   . Substance abuse ([place])    alcohol use  .  Ulcer   . Varicose veins   . Wears dentures   . Wears glasses   . Wears hearing aid    B/L    Current Outpatient Medications  Medication Sig Dispense Refill  . furosemide (LASIX) 40 MG tablet Take 2 tablets (80 mg) by mouth every morning and one 1 tablet (40 mg) in the evening (Patient taking differently: Take 40-80 mg by mouth See admin instructions. Take 80 mg by mouth in the morning and take 40 mg by mouth in the evening) 90 tablet 1  . hydrALAZINE (APRESOLINE) 25 MG tablet Take 25 mg by mouth at bedtime as needed (for HBP >130).     Marland Kitchen linaclotide (LINZESS) 145 MCG CAPS capsule Take 145 mcg by mouth daily as needed (for constipation).     . metoprolol (TOPROL XL) 50 MG 24 hr tablet Take 50 mg by mouth at bedtime as needed (for HBP >130).     Marland Kitchen omeprazole (PRILOSEC) 20 MG capsule Take 1 capsule (20 mg total) by mouth 2 (two) times daily. 60 capsule 5  . Podiatric Products (EUCERIN ADVANCED REPAIR) CREA Apply 1 application topically 2 (two) times daily.    Marland Kitchen spironolactone (ALDACTONE) 100 MG tablet Take  2 tablets (200 mg) by mouth every morning and 1 tablet (100mg ) by mouth in the evening (Patient taking differently: Take 100-200 mg by mouth See admin instructions. Take 200 mg by mouth in the morning and take 100 mg by mouth in the evening) 90 tablet 1  . tetrahydrozoline (VISINE) 0.05 % ophthalmic solution Place 2 drops into both eyes daily as needed (for dry eyes).     No current facility-administered medications for this visit.     Physical Exam BP 132/78   Pulse 70   Temp 97.9 F (36.6 C) (Oral)   Resp 20   Ht 5\' 7"  (1.702 m)   Wt 179 lb (81.2 kg)   SpO2 95% Comment: RA  BMI 28.31 kg/m  78 year old man in no acute distress Alert and oriented x3 with no focal deficits Lungs clear  Diagnostic Tests: Cytologies were negative for malignancy, biopsy showed benign mucous glands.  Impression: Mr. Mullens is a 78 year old gentleman with numerous medical problems.  He was found to  have 2 lung nodules about 2 years ago.  There was a left upper lobe nodule and a right lower lobe nodule.  These were both mixed density nodules.  On follow-up 6 months later there was no change.  He then went about 14 months between follow-up and on 1 of the lesions there was some slight increase in the solid component.  We discussed continued radiographic follow-up versus sampling.  On PET CT there was some minimal uptake in both the nodule so we opted for sampling.  I did a navigational bronchoscopy with sampling of both lesions and fiducial placement on 07/26/2018.  None of the biopsies or cytology showed cancer.  I discussed with Mr. Mapps and his son that this does not rule out the possibility of cancer do the possibility of sampling error.  Unfortunately there was not any definitive benign diagnosis either.  They understand that neither of these lesions have been behaving in an aggressive fashion.  We discussed continued radiographic follow-up versus referral to radiation oncology for possible empiric stereotactic radiation.  Either of these options is reasonable.  He would like to talk to a radiation oncologist to get another opinion.    Plan: Referral to radiation oncology  [name], MD [facility] [phone]

## 2018-08-11 ENCOUNTER — Ambulatory Visit: Payer: Medicare HMO

## 2018-08-11 ENCOUNTER — Ambulatory Visit
Admission: RE | Admit: 2018-08-11 | Discharge: 2018-08-11 | Disposition: A | Discharge status: Home / Self Care | Payer: Medicare HMO | Source: Ambulatory Visit | Attending: Radiation Oncology | Admitting: Radiation Oncology

## 2018-08-11 ENCOUNTER — Telehealth: Payer: Self-pay | Admitting: Radiation Oncology

## 2018-08-11 ENCOUNTER — Institutional Professional Consult (permissible substitution): Payer: Medicare HMO | Admitting: Radiation Oncology

## 2018-08-11 DIAGNOSIS — R918 Other nonspecific abnormal finding of lung field: Secondary | ICD-10-CM

## 2018-08-11 NOTE — Progress Notes (Signed)
Thoracic Location of Tumor / Histology:   Plan: Observation with repeat CT chest in 3 months.  Patient presented in February 2018 with SOB and CT chest revealed bilateral lung nodules and bilateral pleural effusions.  Repeat scan in August 2018 the nodules were essentially unchanged.  New scan 05/18/2018 showed the nodules were generally unchanged in size, but there was a more prominent central solid component in the right lower lobe than there had been previously.  Biopsies of LUL lung 07/26/2018  Tobacco/Marijuana/Snuff/ETOH use:   Past/Anticipated interventions by cardiothoracic surgery, if any:  Dr. Roxan Hockey 06/10/2018 -I recommend continued observation with repeat CT chest in 3 months.  Past/Anticipated interventions by medical oncology, if any:  Signs/Symptoms  Weight changes, if any: Fluctuations due to ascities  Respiratory complaints, if any:   Hemoptysis, if any:   Pain issues, if any:     SAFETY ISSUES:  Prior radiation?   Pacemaker/ICD?   Possible current pregnancy?  Is the patient on methotrexate?   Current Complaints / other details:   - Decompensated cirrhosis- monthly paracentesis drainage of 4L at a time.

## 2018-08-11 NOTE — Telephone Encounter (Signed)
I called and spoke with the patient this morning, he is unable to participate in a WebEx encounter due to the lack of devices at home.  We discussed that we are trying to limit exposure to patients coming to and workers at our facility as well from coronavirus.  As a result, we were able to discuss his course.  In summary this is a pleasant 78 year old gentleman who had a CT scan dating back to February 2018 with bilateral pulmonary nodules.  He was followed up with repeat imaging in August 2018 and those nodules had been unchanged.  He recently had a CT scan on 05/18/2018 that showed a persistent change in the left upper lobe and right lower lobe.  There was mild progression of the central solid component associated with the nodules, and the right lower lobe measured 13 mm, and the central component was 9 mm previously 6. The left upper lobe nodule measured 11 mm in the central solid component was 9 mm.  He underwent pet imaging on 06/24/2018 that revealed the right lower lobe 13 mm nodule having an SUV of 1.13, on remote exam in comparison in September 2014 it had been 5.4 mm.  The left upper lobe nodule was 12 mm with an SUV of .99.  He underwent bronchoscopy with Dr. Roxan Hockey on 07/26/2018 with endobronchial navigation his right lower lobe was sampled with brushings and FNA.  The left upper lobe was also sampled, and his cytologic and pathologic findings were negative for malignancy.  Of note fiducial markers were placed.  Dr. Roxan Hockey recommended proceeding with repeat CT in 3 months time, and the patient wanted a second opinion.  We discussed the findings and work-up thus far, and would recommend the same as Dr. Roxan Hockey for repeat scan in 3 months time.  I have ordered this, and we will coordinate with the patient to see him for formal consultation at that time if there is concern for progression.  He is in agreement with this plan.     Carola Rhine, PAC

## 2018-08-18 DIAGNOSIS — E871 Hypo-osmolality and hyponatremia: Secondary | ICD-10-CM

## 2018-08-18 HISTORY — DX: Hypo-osmolality and hyponatremia: E87.1

## 2018-08-24 ENCOUNTER — Other Ambulatory Visit: Payer: Self-pay | Admitting: Gastroenterology

## 2018-08-24 LAB — FUNGUS CULTURE WITH STAIN

## 2018-08-24 LAB — FUNGUS CULTURE RESULT

## 2018-08-24 LAB — FUNGAL ORGANISM REFLEX

## 2018-08-25 ENCOUNTER — Emergency Department (HOSPITAL_COMMUNITY): Payer: Medicare HMO

## 2018-08-25 ENCOUNTER — Other Ambulatory Visit: Payer: Self-pay

## 2018-08-25 ENCOUNTER — Inpatient Hospital Stay (HOSPITAL_COMMUNITY)
Admission: EM | Admit: 2018-08-25 | Discharge: 2018-08-31 | DRG: 641 | Disposition: A | Discharge status: Home Health | Payer: Medicare HMO | Source: Ambulatory Visit | Attending: Internal Medicine | Admitting: Internal Medicine

## 2018-08-25 ENCOUNTER — Observation Stay (HOSPITAL_COMMUNITY): Payer: Medicare HMO

## 2018-08-25 ENCOUNTER — Encounter (HOSPITAL_COMMUNITY): Payer: Self-pay | Admitting: Emergency Medicine

## 2018-08-25 DIAGNOSIS — Z823 Family history of stroke: Secondary | ICD-10-CM | POA: Diagnosis not present

## 2018-08-25 DIAGNOSIS — I482 Chronic atrial fibrillation, unspecified: Secondary | ICD-10-CM | POA: Diagnosis not present

## 2018-08-25 DIAGNOSIS — K219 Gastro-esophageal reflux disease without esophagitis: Secondary | ICD-10-CM | POA: Diagnosis present

## 2018-08-25 DIAGNOSIS — I10 Essential (primary) hypertension: Secondary | ICD-10-CM | POA: Diagnosis not present

## 2018-08-25 DIAGNOSIS — I4892 Unspecified atrial flutter: Secondary | ICD-10-CM | POA: Diagnosis present

## 2018-08-25 DIAGNOSIS — K5900 Constipation, unspecified: Secondary | ICD-10-CM | POA: Diagnosis not present

## 2018-08-25 DIAGNOSIS — E8770 Fluid overload, unspecified: Secondary | ICD-10-CM | POA: Diagnosis present

## 2018-08-25 DIAGNOSIS — T501X5A Adverse effect of loop [high-ceiling] diuretics, initial encounter: Secondary | ICD-10-CM | POA: Diagnosis present

## 2018-08-25 DIAGNOSIS — F419 Anxiety disorder, unspecified: Secondary | ICD-10-CM | POA: Diagnosis present

## 2018-08-25 DIAGNOSIS — K7460 Unspecified cirrhosis of liver: Secondary | ICD-10-CM | POA: Diagnosis present

## 2018-08-25 DIAGNOSIS — F1011 Alcohol abuse, in remission: Secondary | ICD-10-CM | POA: Diagnosis present

## 2018-08-25 DIAGNOSIS — R51 Headache: Secondary | ICD-10-CM | POA: Diagnosis not present

## 2018-08-25 DIAGNOSIS — E119 Type 2 diabetes mellitus without complications: Secondary | ICD-10-CM | POA: Diagnosis present

## 2018-08-25 DIAGNOSIS — K7031 Alcoholic cirrhosis of liver with ascites: Secondary | ICD-10-CM | POA: Diagnosis present

## 2018-08-25 DIAGNOSIS — E861 Hypovolemia: Secondary | ICD-10-CM | POA: Diagnosis present

## 2018-08-25 DIAGNOSIS — Z8042 Family history of malignant neoplasm of prostate: Secondary | ICD-10-CM | POA: Diagnosis not present

## 2018-08-25 DIAGNOSIS — Z79899 Other long term (current) drug therapy: Secondary | ICD-10-CM

## 2018-08-25 DIAGNOSIS — K7030 Alcoholic cirrhosis of liver without ascites: Secondary | ICD-10-CM | POA: Diagnosis present

## 2018-08-25 DIAGNOSIS — R911 Solitary pulmonary nodule: Secondary | ICD-10-CM | POA: Diagnosis not present

## 2018-08-25 DIAGNOSIS — E871 Hypo-osmolality and hyponatremia: Principal | ICD-10-CM | POA: Diagnosis present

## 2018-08-25 DIAGNOSIS — Z888 Allergy status to other drugs, medicaments and biological substances status: Secondary | ICD-10-CM | POA: Diagnosis not present

## 2018-08-25 DIAGNOSIS — R918 Other nonspecific abnormal finding of lung field: Secondary | ICD-10-CM | POA: Diagnosis present

## 2018-08-25 DIAGNOSIS — Z8249 Family history of ischemic heart disease and other diseases of the circulatory system: Secondary | ICD-10-CM

## 2018-08-25 DIAGNOSIS — E785 Hyperlipidemia, unspecified: Secondary | ICD-10-CM | POA: Diagnosis present

## 2018-08-25 DIAGNOSIS — Z8371 Family history of colonic polyps: Secondary | ICD-10-CM

## 2018-08-25 DIAGNOSIS — R188 Other ascites: Secondary | ICD-10-CM

## 2018-08-25 DIAGNOSIS — Z882 Allergy status to sulfonamides status: Secondary | ICD-10-CM

## 2018-08-25 DIAGNOSIS — K766 Portal hypertension: Secondary | ICD-10-CM | POA: Diagnosis present

## 2018-08-25 DIAGNOSIS — R531 Weakness: Secondary | ICD-10-CM | POA: Diagnosis not present

## 2018-08-25 DIAGNOSIS — R42 Dizziness and giddiness: Secondary | ICD-10-CM | POA: Diagnosis not present

## 2018-08-25 DIAGNOSIS — K3189 Other diseases of stomach and duodenum: Secondary | ICD-10-CM | POA: Diagnosis present

## 2018-08-25 HISTORY — DX: Hypo-osmolality and hyponatremia: E87.1

## 2018-08-25 LAB — CBC WITH DIFFERENTIAL/PLATELET
Abs Immature Granulocytes: 0.04 10*3/uL (ref 0.00–0.07)
Basophils Absolute: 0 10*3/uL (ref 0.0–0.1)
Basophils Relative: 0 %
Eosinophils Absolute: 0 10*3/uL (ref 0.0–0.5)
Eosinophils Relative: 0 %
HCT: 44.6 % (ref 39.0–52.0)
Hemoglobin: 16.3 g/dL (ref 13.0–17.0)
Immature Granulocytes: 1 %
Lymphocytes Relative: 13 %
Lymphs Abs: 0.7 10*3/uL (ref 0.7–4.0)
MCH: 31.3 pg (ref 26.0–34.0)
MCHC: 36.5 g/dL — ABNORMAL HIGH (ref 30.0–36.0)
MCV: 85.8 fL (ref 80.0–100.0)
Monocytes Absolute: 0.8 10*3/uL (ref 0.1–1.0)
Monocytes Relative: 14 %
Neutro Abs: 4 10*3/uL (ref 1.7–7.7)
Neutrophils Relative %: 72 %
Platelets: 173 10*3/uL (ref 150–400)
RBC: 5.2 MIL/uL (ref 4.22–5.81)
RDW: 14.5 % (ref 11.5–15.5)
WBC: 5.6 10*3/uL (ref 4.0–10.5)
nRBC: 0 % (ref 0.0–0.2)

## 2018-08-25 LAB — COMPREHENSIVE METABOLIC PANEL
ALT: 26 U/L (ref 0–44)
AST: 31 U/L (ref 15–41)
Albumin: 3.8 g/dL (ref 3.5–5.0)
Alkaline Phosphatase: 118 U/L (ref 38–126)
Anion gap: 17 — ABNORMAL HIGH (ref 5–15)
BUN: 33 mg/dL — ABNORMAL HIGH (ref 8–23)
CO2: 21 mmol/L — ABNORMAL LOW (ref 22–32)
Calcium: 9.8 mg/dL (ref 8.9–10.3)
Chloride: 78 mmol/L — ABNORMAL LOW (ref 98–111)
Creatinine, Ser: 1.26 mg/dL — ABNORMAL HIGH (ref 0.61–1.24)
GFR calc Af Amer: >60 mL/min (ref >60)
GFR calc non Af Amer: 55 mL/min — ABNORMAL LOW (ref >60)
Glucose, Bld: 119 mg/dL — ABNORMAL HIGH (ref 70–99)
Potassium: 4.4 mmol/L (ref 3.5–5.1)
Sodium: 116 mmol/L — CL (ref 135–145)
Total Bilirubin: 2.3 mg/dL — ABNORMAL HIGH (ref 0.3–1.2)
Total Protein: 8.1 g/dL (ref 6.5–8.1)

## 2018-08-25 LAB — OSMOLALITY, URINE: Osmolality, Ur: 472 mOsm/kg (ref 300–900)

## 2018-08-25 LAB — BASIC METABOLIC PANEL
Anion gap: 18 — ABNORMAL HIGH (ref 5–15)
Anion gap: 11 (ref 5–15)
BUN: 34 mg/dL — ABNORMAL HIGH (ref 8–23)
BUN: 33 mg/dL — ABNORMAL HIGH (ref 8–23)
CO2: 19 mmol/L — ABNORMAL LOW (ref 22–32)
CO2: 24 mmol/L (ref 22–32)
Calcium: 9.8 mg/dL (ref 8.9–10.3)
Calcium: 9 mg/dL (ref 8.9–10.3)
Chloride: 80 mmol/L — ABNORMAL LOW (ref 98–111)
Chloride: 81 mmol/L — ABNORMAL LOW (ref 98–111)
Creatinine, Ser: 1.12 mg/dL (ref 0.61–1.24)
Creatinine, Ser: 1.19 mg/dL (ref 0.61–1.24)
GFR calc Af Amer: >60 mL/min (ref >60)
GFR calc Af Amer: >60 mL/min (ref >60)
GFR calc non Af Amer: >60 mL/min (ref >60)
GFR calc non Af Amer: 59 mL/min — ABNORMAL LOW (ref >60)
Glucose, Bld: 95 mg/dL (ref 70–99)
Glucose, Bld: 123 mg/dL — ABNORMAL HIGH (ref 70–99)
Potassium: 5.3 mmol/L — ABNORMAL HIGH (ref 3.5–5.1)
Potassium: 4.6 mmol/L (ref 3.5–5.1)
Sodium: 117 mmol/L — CL (ref 135–145)
Sodium: 116 mmol/L — CL (ref 135–145)

## 2018-08-25 LAB — OSMOLALITY: Osmolality: 250 mOsm/kg — ABNORMAL LOW (ref 275–295)

## 2018-08-25 LAB — URINALYSIS, ROUTINE W REFLEX MICROSCOPIC
Bacteria, UA: NONE SEEN
Bilirubin Urine: NEGATIVE
Glucose, UA: NEGATIVE mg/dL
Hgb urine dipstick: NEGATIVE
Ketones, ur: NEGATIVE mg/dL
Leukocytes,Ua: NEGATIVE
Nitrite: NEGATIVE
Protein, ur: 30 mg/dL — AB
Specific Gravity, Urine: 1.014 (ref 1.005–1.030)
pH: 6 (ref 5.0–8.0)

## 2018-08-25 LAB — ETHANOL: Alcohol, Ethyl (B): <10 mg/dL (ref <10)

## 2018-08-25 LAB — CREATININE, URINE, RANDOM: Creatinine, Urine: 66.15 mg/dL

## 2018-08-25 LAB — BRAIN NATRIURETIC PEPTIDE: B Natriuretic Peptide: 586.3 pg/mL — ABNORMAL HIGH (ref 0.0–100.0)

## 2018-08-25 MED ORDER — PANTOPRAZOLE SODIUM 40 MG PO TBEC
40.0000 mg | DELAYED_RELEASE_TABLET | Freq: Two times a day (BID) | ORAL | Status: DC
  Administered 2018-08-25 – 2018-08-31 (×12): 40 mg via ORAL
  Filled 2018-08-25 (×12): qty 1

## 2018-08-25 MED ORDER — SODIUM CHLORIDE 0.9 % IV BOLUS
500.0000 mL | Freq: Once | INTRAVENOUS | Status: AC
  Administered 2018-08-25: 15:00:00 500 mL via INTRAVENOUS

## 2018-08-25 MED ORDER — ONDANSETRON HCL 4 MG PO TABS: 4.0000 mg | ORAL_TABLET | Freq: Four times a day (QID) | ORAL | Status: DC | PRN

## 2018-08-25 MED ORDER — ONDANSETRON HCL 4 MG/2ML IJ SOLN: 4.0000 mg | Freq: Four times a day (QID) | INTRAMUSCULAR | Status: DC | PRN

## 2018-08-25 MED ORDER — METOPROLOL SUCCINATE ER 50 MG PO TB24: 50.0000 mg | ORAL_TABLET | Freq: Every evening | ORAL | Status: DC | PRN

## 2018-08-25 MED ORDER — SODIUM CHLORIDE 0.9 % IV SOLN
INTRAVENOUS | Status: DC
  Administered 2018-08-25 – 2018-08-27 (×4): via INTRAVENOUS

## 2018-08-25 NOTE — ED Notes (Signed)
Attempted report x1. 

## 2018-08-25 NOTE — ED Triage Notes (Signed)
Pt from home. Pt said PCP called and told him his sodium was low and was sent here.

## 2018-08-25 NOTE — Plan of Care (Signed)

## 2018-08-25 NOTE — ED Notes (Signed)
ED TO INPATIENT HANDOFF REPORT  ED Nurse Name and Phone #: Sherrine Maples [phone]  S Name/Age/Gender [name] 78 y.o. male Room/Bed: 025C/025C  Code Status   Code Status: Prior  Home/SNF/Other Home Patient oriented to: self, place, time and situation Is this baseline? Yes   Triage Complete: Triage complete  Chief Complaint Low sodium  Triage Note Pt from home. Pt said PCP called and told him his sodium was low and was sent here.    Allergies Allergies  Allergen Reactions  . Lisinopril Anaphylaxis and Other (See Comments)    Hyperkalemia, Dizziness   . Lipitor [Atorvastatin] Other (See Comments)    Marked leg fatigue  . Sulfonamide Derivatives Other (See Comments)    UNSPECIFIED REACTION  [Unsure of reaction]    Level of Care/Admitting Diagnosis ED Disposition    ED Disposition Condition Comment   Admit  Hospital Area: [facility] [[id number]]  Level of Care: Med-Surg [16]  I expect the patient will be discharged within 24 hours: No (not a candidate for 5C-Observation unit)  Diagnosis: Hyponatremia [270623]  Admitting Physician: Dessa Phi [[id number]]  Attending Physician: Dessa Phi [phone]  PT Class (Do Not Modify): Observation [104]  PT Acc Code (Do Not Modify): Observation [10022]       B Medical/Surgery History Past Medical History:  Diagnosis Date  . Adenomatous polyps 06/2004  . Agent orange exposure   . Alcoholic cirrhosis of liver with ascites ([place])   . Alcoholism ([place])   . Allergy   . Anxiety    PTSD- no meds  . Atrial fibrillation ([place])   . Atrial flutter ([place])    had ablation   . Chronic kidney disease    "beginnings of kidney failure"- 3-4 years ago  . Depression   . Diabetes mellitus without complication (HCC)    no meds  . Diverticulosis   . GERD (gastroesophageal reflux disease)   . Hemorrhoids   . Hiatal hernia   . Hx of hernia repair   . Hyperlipidemia   . Hypertension   . Iron deficiency anemia   .  Lung nodules    bilateral  . Pedal edema   . Pneumonia    06-2016  . PTSD (post-traumatic stress disorder)   . Substance abuse ([place])    alcohol use  . Ulcer   . Varicose veins   . Wears dentures   . Wears glasses   . Wears hearing aid    B/L   Past Surgical History:  Procedure Laterality Date  . ATRIAL ABLATION SURGERY    . COLONOSCOPY    . FUDUCIAL PLACEMENT N/A 07/26/2018   Procedure: PLACEMENT OF FUDUCIAL;  Surgeon: [name], MD;  Location: [facility];  Service: Thoracic;  Laterality: N/A;  . IR PARACENTESIS  10/28/2016  . IR PARACENTESIS  11/12/2016  . IR PARACENTESIS  12/02/2016  . IR PARACENTESIS  12/10/2016  . IR PARACENTESIS  12/30/2016  . IR PARACENTESIS  01/27/2017  . IR PARACENTESIS  09/09/2017  . IR PARACENTESIS  03/04/2018  . IR PARACENTESIS  04/08/2018  . IR PARACENTESIS  05/10/2018  . IR PARACENTESIS  05/21/2018  . IR PARACENTESIS  06/16/2018  . IR PARACENTESIS  07/23/2018  . IR RADIOLOGIST EVAL & MGMT  06/22/2018  . POLYPECTOMY    . TONSILLECTOMY    . UMBILICAL HERNIA REPAIR    . VARICOSE VEIN SURGERY     x4  . VIDEO BRONCHOSCOPY WITH ENDOBRONCHIAL NAVIGATION N/A 07/26/2018   Procedure: VIDEO  BRONCHOSCOPY WITH ENDOBRONCHIAL NAVIGATION;  Surgeon: [name], MD;  Location[facility] OR;  Service: Thoracic;  Laterality: N/A;     A IV Location/Drains/Wounds Patient Lines/Drains/Airways Status   Active Line/Drains/Airways    Name:   Placement date:   Placement time:   Site:   Days:   Peripheral IV 08/25/18 Right Antecubital   08/25/18    1336    Antecubital   less than 1   Incision (Closed) 07/26/18 N/A Other (Comment)   07/26/18    1023     30   Wound 10/07/12 Other (Comment) Leg Bilateral;Lower itchy, scab-like, red discolored patchy areas   10/07/12    2039    Leg   2148          Intake/Output Last 24 hours No intake or output data in the 24 hours ending 08/25/18 1456  Labs/Imaging Results for orders placed or performed during the hospital  encounter of 08/25/18 (from the past 48 hour(s))  CBC with Differential/Platelet     Status: Abnormal   Collection Time: 08/25/18  1:16 PM  Result Value Ref Range   WBC 5.6 4.0 - 10.5 K/uL   RBC 5.20 4.22 - 5.81 MIL/uL   Hemoglobin 16.3 13.0 - 17.0 g/dL   HCT 44.6 39.0 - 52.0 %   MCV 85.8 80.0 - 100.0 fL   MCH 31.3 26.0 - 34.0 pg   MCHC 36.5 (H) 30.0 - 36.0 g/dL   RDW 14.5 11.5 - 15.5 %   Platelets 173 150 - 400 K/uL   nRBC 0.0 0.0 - 0.2 %   Neutrophils Relative % 72 %   Neutro Abs 4.0 1.7 - 7.7 K/uL   Lymphocytes Relative 13 %   Lymphs Abs 0.7 0.7 - 4.0 K/uL   Monocytes Relative 14 %   Monocytes Absolute 0.8 0.1 - 1.0 K/uL   Eosinophils Relative 0 %   Eosinophils Absolute 0.0 0.0 - 0.5 K/uL   Basophils Relative 0 %   Basophils Absolute 0.0 0.0 - 0.1 K/uL   Immature Granulocytes 1 %   Abs Immature Granulocytes 0.04 0.00 - 0.07 K/uL    Comment: Performed at [facility], [address], [place], [place] [address]  Comprehensive metabolic panel     Status: Abnormal   Collection Time: 08/25/18  1:16 PM  Result Value Ref Range   Sodium 116 (LL) 135 - 145 mmol/L    Comment: CRITICAL RESULT CALLED TO, READ BACK BY AND VERIFIED WITH: Dontasia Miranda,M RN @ 1419 08/25/18 LEONARD,A    Potassium 4.4 3.5 - 5.1 mmol/L   Chloride 78 (L) 98 - 111 mmol/L   CO2 21 (L) 22 - 32 mmol/L   Glucose, Bld 119 (H) 70 - 99 mg/dL   BUN 33 (H) 8 - 23 mg/dL   Creatinine, Ser 1.26 (H) 0.61 - 1.24 mg/dL   Calcium 9.8 8.9 - 10.3 mg/dL   Total Protein 8.1 6.5 - 8.1 g/dL   Albumin 3.8 3.5 - 5.0 g/dL   AST 31 15 - 41 U/L   ALT 26 0 - 44 U/L   Alkaline Phosphatase 118 38 - 126 U/L   Total Bilirubin 2.3 (H) 0.3 - 1.2 mg/dL   GFR calc non Af Amer 55 (L) >60 mL/min   GFR calc Af Amer >60 >60 mL/min   Anion gap 17 (H) 5 - 15    Comment: Performed at [facility] [address], [place], [place] [address]  Ethanol  Status: None   Collection Time: 08/25/18  1:16 PM  Result Value Ref Range   Alcohol,  Ethyl (B) <10 <10 mg/dL    Comment: (NOTE) Lowest detectable limit for serum alcohol is 10 mg/dL. For medical purposes only. Performed at [facility], [place] [address], [place], [place] [address]   Osmolality     Status: Abnormal   Collection Time: 08/25/18  1:16 PM  Result Value Ref Range   Osmolality 250 (L) 275 - 295 mOsm/kg    Comment: Performed at [facility], [place] [address], [place],  [address]  Brain natriuretic peptide     Status: Abnormal   Collection Time: 08/25/18  1:16 PM  Result Value Ref Range   B Natriuretic Peptide 586.3 (H) 0.0 - 100.0 pg/mL    Comment: Performed at [facility] [address] [place], [place] [address]   Ct Head Wo Contrast  Result Date: 08/25/2018 CLINICAL DATA:  Headache EXAM: CT HEAD WITHOUT CONTRAST TECHNIQUE: Contiguous axial images were obtained from the base of the skull through the vertex without intravenous contrast. COMPARISON:  None. FINDINGS: Brain: Mild age related volume loss. No acute intracranial abnormality. Specifically, no hemorrhage, hydrocephalus, mass lesion, acute infarction, or significant intracranial injury. Vascular: No hyperdense vessel or unexpected calcification. Skull: No acute calvarial abnormality. Sinuses/Orbits: Visualized paranasal sinuses and mastoids clear. Orbital soft tissues unremarkable. Other: None IMPRESSION: No acute intracranial abnormality. Electronically Signed   By: Rolm Baptise M.D.   On: 08/25/2018 14:24    Pending Labs Unresulted Labs (From admission, onward)    Start     Ordered   08/25/18 1303  Osmolality, urine  Once,   STAT     08/25/18 1302   08/25/18 1303  Urinalysis, Routine w reflex microscopic  Once,   R     08/25/18 1302   08/25/18 1303  Creatinine, urine, random  ONCE - STAT,   STAT     08/25/18 1302          Vitals/Pain Today's Vitals   08/25/18 1307 08/25/18 1309 08/25/18 1310  BP: 122/70 122/70   Pulse: (!) 58 60 (!) 52  Resp: 12 20 15   Temp:  97.7  F (36.5 C)   TempSrc:  Oral   SpO2: 100% 93% 99%  Weight:  73.9 kg   Height:  5\' 6"  (1.676 m)   PainSc:  0-No pain     Isolation Precautions No active isolations  Medications Medications  sodium chloride 0.9 % bolus 500 mL (500 mLs Intravenous New Bag/Given 08/25/18 1430)    Mobility walks Low fall risk   Focused Assessments    R Recommendations: See Admitting Provider Note  Report given to:   Additional Notes:

## 2018-08-25 NOTE — ED Provider Notes (Signed)
[facility] EMERGENCY DEPARTMENT Provider Note   CSN: [id number] Arrival date & time: 08/25/18  1257    History   Chief Complaint No chief complaint on file.   HPI [name] is a 78 y.o. male hx of alcohol cirrhosis, afib not on blood thinners, here presenting with hyponatremia.  Patient states that he gets monthly paracentesis and his GI doctor increased his lasix to 80 mg in the morning and 40 mg in the evening and doubled spironolactone. Patient has been having diffuse weakness.  He also noticed that he has been urinating very frequently and feels thirsty all the time.  He also has some headaches as well.  Of note, patient did have a recent lung biopsy that did not show small cell lung cancer.  He also had a PET scan that was unremarkable as well.  Patient states that he is no longer drinking alcohol.  He denies any sick contacts or fevers or vomiting. Patient went to see PCP today and his sodium is low so sent for evaluation.      The history is provided by the patient.    Past Medical History:  Diagnosis Date  . Adenomatous polyps 06/2004  . Agent orange exposure   . Alcoholic cirrhosis of liver with ascites ([place])   . Alcoholism ([place])   . Allergy   . Anxiety    PTSD- no meds  . Atrial fibrillation ([place])   . Atrial flutter ([place])    had ablation   . Chronic kidney disease    "beginnings of kidney failure"- 3-4 years ago  . Depression   . Diabetes mellitus without complication (HCC)    no meds  . Diverticulosis   . GERD (gastroesophageal reflux disease)   . Hemorrhoids   . Hiatal hernia   . Hx of hernia repair   . Hyperlipidemia   . Hypertension   . Iron deficiency anemia   . Lung nodules    bilateral  . Pedal edema   . Pneumonia    06-2016  . PTSD (post-traumatic stress disorder)   . Substance abuse ([place])    alcohol use  . Ulcer   . Varicose veins   . Wears dentures   . Wears glasses   . Wears hearing aid    B/L    Patient Active  Problem List   Diagnosis Date Noted  . Leukopenia 07/16/2018  . Lung nodule 07/16/2018  . Coagulopathy ([place]) 07/15/2018  . Iron deficiency anemia 02/19/2016  . Microcytic anemia 02/18/2016  . GERD (gastroesophageal reflux disease) 02/18/2016  . Orthostatic hypotension 02/17/2016  . Syncope 02/17/2016  . Hyponatremia 02/17/2016  . Varicose veins of bilateral lower extremities with other complications 05/39/7673  . Cirrhosis, alcoholic ([place]) 41/93/7902  . Ascites 10/07/2012  . Varicose veins of lower extremities with other complications 40/97/3532  . HYPERLIPIDEMIA 11/01/2008  . ALCOHOLISM 11/01/2008  . Essential hypertension 11/01/2008  . ATRIAL FIBRILLATION 11/01/2008  . ATRIAL FLUTTER 11/01/2008  . HEMORRHOIDS 11/01/2008  . ISCHEMIA 11/01/2008  . HERNIA, UMBILICAL 99/24/2683  . DIVERTICULOSIS OF COLON 11/01/2008  . GERD 12/16/2007  . COLONIC POLYPS, ADENOMATOUS, HX OF 12/16/2007    Past Surgical History:  Procedure Laterality Date  . ATRIAL ABLATION SURGERY    . COLONOSCOPY    . FUDUCIAL PLACEMENT N/A 07/26/2018   Procedure: PLACEMENT OF FUDUCIAL;  Surgeon: [name], MD;  Location: [facility];  Service: Thoracic;  Laterality: N/A;  . IR PARACENTESIS  10/28/2016  . IR  PARACENTESIS  11/12/2016  . IR PARACENTESIS  12/02/2016  . IR PARACENTESIS  12/10/2016  . IR PARACENTESIS  12/30/2016  . IR PARACENTESIS  01/27/2017  . IR PARACENTESIS  09/09/2017  . IR PARACENTESIS  03/04/2018  . IR PARACENTESIS  04/08/2018  . IR PARACENTESIS  05/10/2018  . IR PARACENTESIS  05/21/2018  . IR PARACENTESIS  06/16/2018  . IR PARACENTESIS  07/23/2018  . IR RADIOLOGIST EVAL & MGMT  06/22/2018  . POLYPECTOMY    . TONSILLECTOMY    . UMBILICAL HERNIA REPAIR    . VARICOSE VEIN SURGERY     x4  . VIDEO BRONCHOSCOPY WITH ENDOBRONCHIAL NAVIGATION N/A 07/26/2018   Procedure: VIDEO BRONCHOSCOPY WITH ENDOBRONCHIAL NAVIGATION;  Surgeon: [name], MD;  Location: [facility];  Service: Thoracic;   Laterality: N/A;        Home Medications    Prior to Admission medications   Medication Sig Start Date End Date Taking? Authorizing Provider  furosemide (LASIX) 40 MG tablet TAKE 2 TABLETS BY MOUTH IN THE MORNING AND  1 IN THE EVENING 08/24/18   Ladene Artist, MD  hydrALAZINE (APRESOLINE) 25 MG tablet Take 25 mg by mouth at bedtime as needed (for HBP >130).  11/01/12   [provider]  linaclotide (LINZESS) 145 MCG CAPS capsule Take 145 mcg by mouth daily as needed (for constipation).     [provider]  metoprolol (TOPROL XL) 50 MG 24 hr tablet Take 50 mg by mouth at bedtime as needed (for HBP >130).     [provider]  omeprazole (PRILOSEC) 20 MG capsule Take 1 capsule (20 mg total) by mouth 2 (two) times daily. 03/23/13   Ladene Artist, MD  [employer] [facility] ADVANCED REPAIR) CREA Apply 1 application topically 2 (two) times daily.    [provider]  spironolactone (ALDACTONE) 100 MG tablet TAKE 2 TABLETS BY MOUTH ONCE DAILY IN THE MORNING AND 1 TABLET BY MOUTH IN THE EVENING 08/24/18   Ladene Artist, MD  tetrahydrozoline (VISINE) 0.05 % ophthalmic solution Place 2 drops into both eyes daily as needed (for dry eyes).    [provider]    Family History Family History  Problem Relation Age of Onset  . Colon polyps Brother   . Prostate cancer Brother   . Heart disease Father        rheumatic fever as child  . Stroke Mother   . Colon cancer Neg Hx   . Esophageal cancer Neg Hx   . Rectal cancer Neg Hx   . Stomach cancer Neg Hx     Social History Social History   Tobacco Use  . Smoking status: Never Smoker  . Smokeless tobacco: Never Used  Substance Use Topics  . Alcohol use: Yes    Comment: occasaional beer  . Drug use: No     Allergies   Lisinopril; Lipitor [atorvastatin]; and Sulfonamide derivatives   Review of Systems Review of Systems  Neurological: Positive for weakness and headaches.  All other  systems reviewed and are negative.    Physical Exam Updated Vital Signs BP 122/70 (BP Location: Right Arm)   Pulse (!) 52   Temp 97.7 F (36.5 C) (Oral)   Resp 15   Ht 5\' 6"  (1.676 m)   Wt 73.9 kg   SpO2 99%   BMI 26.31 kg/m   Physical Exam Vitals signs and nursing note reviewed.  HENT:     Head: Normocephalic.  Nose: Nose normal.     Mouth/Throat:     Mouth: Mucous membranes are moist.  Eyes:     Extraocular Movements: Extraocular movements intact.     Pupils: Pupils are equal, round, and reactive to light.  Neck:     Musculoskeletal: Normal range of motion.  Cardiovascular:     Rate and Rhythm: Normal rate and regular rhythm.     Pulses: Normal pulses.     Heart sounds: Normal heart sounds.  Pulmonary:     Effort: Pulmonary effort is normal.  Abdominal:     General: Abdomen is flat.  Musculoskeletal: Normal range of motion.     Comments: Trace edema bilaterally   Skin:    General: Skin is warm.     Capillary Refill: Capillary refill takes less than 2 seconds.  Neurological:     General: No focal deficit present.     Mental Status: He is alert.  Psychiatric:        Mood and Affect: Mood normal.      ED Treatments / Results  Labs (all labs ordered are listed, but only abnormal results are displayed) Labs Reviewed  CBC WITH DIFFERENTIAL/PLATELET - Abnormal; Notable for the following components:      Result Value   MCHC 36.5 (*)    All other components within normal limits  COMPREHENSIVE METABOLIC PANEL  ETHANOL  OSMOLALITY  OSMOLALITY, URINE  URINALYSIS, ROUTINE W REFLEX MICROSCOPIC  CREATININE, URINE, RANDOM  BRAIN NATRIURETIC PEPTIDE    EKG EKG Interpretation  Date/Time:  Wednesday August 25 2018 13:21:18 EDT Ventricular Rate:  56 PR Interval:    QRS Duration: 135 QT Interval:  425 QTC Calculation: 411 R Axis:   -86 Text Interpretation:  Atrial flutter RBBB and LAFB Probable anteroseptal infarct, old No significant change since last  tracing Confirmed by Wandra Arthurs [phone]) on 08/25/2018 1:29:00 PM   Radiology No results found.  Procedures Procedures (including critical care time)   CRITICAL CARE Performed by: Wandra Arthurs   Total critical care time: 30  minutes  Critical care time was exclusive of separately billable procedures and treating other patients.  Critical care was necessary to treat or prevent imminent or life-threatening deterioration.  Critical care was time spent personally by me on the following activities: development of treatment plan with patient and/or surrogate as well as nursing, discussions with consultants, evaluation of patient's response to treatment, examination of patient, obtaining history from patient or surrogate, ordering and performing treatments and interventions, ordering and review of laboratory studies, ordering and review of radiographic studies, pulse oximetry and re-evaluation of patient's condition.  Medications Ordered in ED Medications - No data to display   Initial Impression / Assessment and Plan / ED Course  I have reviewed the triage vital signs and the nursing notes.  Pertinent labs & imaging results that were available during my care of the patient were reviewed by me and considered in my medical decision making (see chart for details).       [name] is a 78 y.o. male here with hyponatremia. Patient has been worked up and had lung biopsy that did not show to lung cancer.  He also had increased diuretics recently and likely was over diuresed. He had no infectious symptoms but does have some headache so consider diabetes insipidus. Will get labs, osm, CT head.   3:35 PM Sodium is 116. Serum Osm low. CT head unremarkable. Appears slightly dry. Hospitalist to admit for hyponatremia. Given  500 cc bolus.    Final Clinical Impressions(s) / ED Diagnoses   Final diagnoses:  None    ED Discharge Orders    None       Drenda Freeze, MD 08/25/18 1535

## 2018-08-25 NOTE — H&P (Signed)
History and Physical    DETRELL UMSCHEID EXH:[id number] DOB: 23-Aug-1940 DOA: 08/25/2018  PCP: Shon Baton, MD  Patient coming from: Home  Chief Complaint: Dizziness  HPI: PARAM CAPRI is a 78 y.o. male with medical history significant of alcoholic cirrhosis, chronic atrial fibrillation/atrial flutter, diabetes, lung nodules who presents with dizziness.  He was evaluated by his primary care physician due to his dizziness/lightheadedness.  Labs revealed low sodium levels and patient was referred to be evaluated in the emergency department.  He states that about 2 months ago he had a paracentesis, and then about a month ago, his Lasix and spironolactone dosing was increased by his GI physician.  No other complaints including chest pain, shortness of breath, nausea, vomiting, abdominal pain or peripheral edema.  ED Course: Labs obtained which revealed sodium 116, creatinine 1.26, BNP 586, serum osmolality 250, CT head negative for acute intracranial pathology.  Chest x-ray pending at time of admission  Review of Systems: As per HPI otherwise 10 point review of systems negative.   Past Medical History:  Diagnosis Date  . Adenomatous polyps 06/2004  . Agent orange exposure   . Alcoholic cirrhosis of liver with ascites ([place])   . Alcoholism ([place])   . Allergy   . Anxiety    PTSD- no meds  . Atrial fibrillation ([place])   . Atrial flutter ([place])    had ablation   . Chronic kidney disease    "beginnings of kidney failure"- 3-4 years ago  . Depression   . Diabetes mellitus without complication (HCC)    no meds  . Diverticulosis   . GERD (gastroesophageal reflux disease)   . Hemorrhoids   . Hiatal hernia   . Hx of hernia repair   . Hyperlipidemia   . Hypertension   . Iron deficiency anemia   . Lung nodules    bilateral  . Pedal edema   . Pneumonia    06-2016  . PTSD (post-traumatic stress disorder)   . Substance abuse ([place])    alcohol use  . Ulcer   . Varicose veins   . Wears dentures   .  Wears glasses   . Wears hearing aid    B/L    Past Surgical History:  Procedure Laterality Date  . ATRIAL ABLATION SURGERY    . COLONOSCOPY    . FUDUCIAL PLACEMENT N/A 07/26/2018   Procedure: PLACEMENT OF FUDUCIAL;  Surgeon: [name], MD;  Location: [place];  Service: Thoracic;  Laterality: N/A;  . IR PARACENTESIS  10/28/2016  . IR PARACENTESIS  11/12/2016  . IR PARACENTESIS  12/02/2016  . IR PARACENTESIS  12/10/2016  . IR PARACENTESIS  12/30/2016  . IR PARACENTESIS  01/27/2017  . IR PARACENTESIS  09/09/2017  . IR PARACENTESIS  03/04/2018  . IR PARACENTESIS  04/08/2018  . IR PARACENTESIS  05/10/2018  . IR PARACENTESIS  05/21/2018  . IR PARACENTESIS  06/16/2018  . IR PARACENTESIS  07/23/2018  . IR RADIOLOGIST EVAL & MGMT  06/22/2018  . POLYPECTOMY    . TONSILLECTOMY    . UMBILICAL HERNIA REPAIR    . VARICOSE VEIN SURGERY     x4  . VIDEO BRONCHOSCOPY WITH ENDOBRONCHIAL NAVIGATION N/A 07/26/2018   Procedure: VIDEO BRONCHOSCOPY WITH ENDOBRONCHIAL NAVIGATION;  Surgeon: [name], MD;  Location: [facility];  Service: Thoracic;  Laterality: N/A;     reports that he has never smoked. He has never used smokeless tobacco. He reports current alcohol use. He reports that  he does not use drugs.  Allergies  Allergen Reactions  . Lisinopril Anaphylaxis and Other (See Comments)    Hyperkalemia, Dizziness   . Lipitor [Atorvastatin] Other (See Comments)    Marked leg fatigue  . Sulfonamide Derivatives Other (See Comments)    UNSPECIFIED REACTION  [Unsure of reaction]    Family History  Problem Relation Age of Onset  . Colon polyps Brother   . Prostate cancer Brother   . Heart disease Father        rheumatic fever as child  . Stroke Mother   . Colon cancer Neg Hx   . Esophageal cancer Neg Hx   . Rectal cancer Neg Hx   . Stomach cancer Neg Hx     Prior to Admission medications   Medication Sig Start Date End Date Taking? Authorizing Provider  furosemide (LASIX) 40 MG tablet  TAKE 2 TABLETS BY MOUTH IN THE MORNING AND  1 IN THE EVENING 08/24/18   Ladene Artist, MD  hydrALAZINE (APRESOLINE) 25 MG tablet Take 25 mg by mouth at bedtime as needed (for HBP >130).  11/01/12   [provider]  linaclotide (LINZESS) 145 MCG CAPS capsule Take 145 mcg by mouth daily as needed (for constipation).     [provider]  metoprolol (TOPROL XL) 50 MG 24 hr tablet Take 50 mg by mouth at bedtime as needed (for HBP >130).     [provider]  omeprazole (PRILOSEC) 20 MG capsule Take 1 capsule (20 mg total) by mouth 2 (two) times daily. 03/23/13   Ladene Artist, MD  [employer] [facility] ADVANCED REPAIR) CREA Apply 1 application topically 2 (two) times daily.    [provider]  spironolactone (ALDACTONE) 100 MG tablet TAKE 2 TABLETS BY MOUTH ONCE DAILY IN THE MORNING AND 1 TABLET BY MOUTH IN THE EVENING 08/24/18   Ladene Artist, MD  tetrahydrozoline (VISINE) 0.05 % ophthalmic solution Place 2 drops into both eyes daily as needed (for dry eyes).    [provider]    Physical Exam: Vitals:   08/25/18 1307 08/25/18 1309 08/25/18 1310  BP: 122/70 122/70   Pulse: (!) 58 60 (!) 52  Resp: 12 20 15   Temp:  97.7 F (36.5 C)   TempSrc:  Oral   SpO2: 100% 93% 99%  Weight:  73.9 kg   Height:  5\' 6"  (1.676 m)      Constitutional: NAD, calm, comfortable, hard of hearing Eyes: PERRL, lids and conjunctivae normal ENMT: Mucous membranes are moist. Posterior pharynx clear of any exudate or lesions.Normal dentition.  Neck: normal, supple, no masses, no thyromegaly Respiratory: clear to auscultation bilaterally, no wheezing, no crackles. Normal respiratory effort. No accessory muscle use.  Cardiovascular: Regular rate and rhythm, no murmurs / rubs / gallops. No extremity edema.  Abdomen: no tenderness, no masses palpated. No hepatosplenomegaly. Bowel sounds positive. Soft without distention Musculoskeletal: no clubbing / cyanosis. No joint  deformity upper and lower extremities. Normal muscle tone.  Skin: no rashes, lesions, ulcers. No induration Neurologic: CN 2-12 grossly intact. Strength 5/5 in all 4.  Speech clear Psychiatric: Normal judgment and insight. Alert and oriented x 3. Normal mood.   Labs on Admission: I have personally reviewed following labs and imaging studies  CBC: Recent Labs  Lab 08/25/18 1316  WBC 5.6  NEUTROABS 4.0  HGB 16.3  HCT 44.6  MCV 85.8  PLT 734   Basic Metabolic Panel: Recent Labs  Lab 08/25/18 1316  NA  116*  K 4.4  CL 78*  CO2 21*  GLUCOSE 119*  BUN 33*  CREATININE 1.26*  CALCIUM 9.8   GFR: Estimated Creatinine Clearance: 45.7 mL/min (A) (by C-G formula based on SCr of 1.26 mg/dL (H)). Liver Function Tests: Recent Labs  Lab 08/25/18 1316  AST 31  ALT 26  ALKPHOS 118  BILITOT 2.3*  PROT 8.1  ALBUMIN 3.8   No results for input(s): LIPASE, AMYLASE in the last 168 hours. No results for input(s): AMMONIA in the last 168 hours. Coagulation Profile: No results for input(s): INR, PROTIME in the last 168 hours. Cardiac Enzymes: No results for input(s): CKTOTAL, CKMB, CKMBINDEX, TROPONINI in the last 168 hours. BNP (last 3 results) No results for input(s): PROBNP in the last 8760 hours. HbA1C: No results for input(s): HGBA1C in the last 72 hours. CBG: No results for input(s): GLUCAP in the last 168 hours. Lipid Profile: No results for input(s): CHOL, HDL, LDLCALC, TRIG, CHOLHDL, LDLDIRECT in the last 72 hours. Thyroid Function Tests: No results for input(s): TSH, T4TOTAL, FREET4, T3FREE, THYROIDAB in the last 72 hours. Anemia Panel: No results for input(s): VITAMINB12, FOLATE, FERRITIN, TIBC, IRON, RETICCTPCT in the last 72 hours. Urine analysis:    Component Value Date/Time   COLORURINE YELLOW 02/17/2016 0216   APPEARANCEUR CLEAR 02/17/2016 0216   LABSPEC 1.013 02/17/2016 0216   PHURINE 6.0 02/17/2016 0216   GLUCOSEU NEGATIVE 02/17/2016 0216   HGBUR NEGATIVE  02/17/2016 0216   BILIRUBINUR NEGATIVE 02/17/2016 0216   KETONESUR NEGATIVE 02/17/2016 0216   PROTEINUR NEGATIVE 02/17/2016 0216   NITRITE NEGATIVE 02/17/2016 0216   LEUKOCYTESUR NEGATIVE 02/17/2016 0216   Sepsis Labs: !!!!!!!!!!!!!!!!!!!!!!!!!!!!!!!!!!!!!!!!!!!! @LABRCNTIP (procalcitonin:4,lacticidven:4) )No results found for this or any previous visit (from the past 240 hour(s)).   Radiological Exams on Admission: Ct Head Wo Contrast  Result Date: 08/25/2018 CLINICAL DATA:  Headache EXAM: CT HEAD WITHOUT CONTRAST TECHNIQUE: Contiguous axial images were obtained from the base of the skull through the vertex without intravenous contrast. COMPARISON:  None. FINDINGS: Brain: Mild age related volume loss. No acute intracranial abnormality. Specifically, no hemorrhage, hydrocephalus, mass lesion, acute infarction, or significant intracranial injury. Vascular: No hyperdense vessel or unexpected calcification. Skull: No acute calvarial abnormality. Sinuses/Orbits: Visualized paranasal sinuses and mastoids clear. Orbital soft tissues unremarkable. Other: None IMPRESSION: No acute intracranial abnormality. Electronically Signed   By: Rolm Baptise M.D.   On: 08/25/2018 14:24    EKG: Independently reviewed.  Atrial flutter rate 56  Assessment/Plan Principal Problem:   Hyponatremia Active Problems:   Essential hypertension   Atrial flutter (HCC)   Cirrhosis, alcoholic (HCC)   Lung nodule  Acute on chronic hyponatremia -Patient has chronic hypervolemic hyponatremia with his sodium levels ranging between 124-1 31 in the past 3 years.  Patient's Lasix and spironolactone dosing was increased last month and patient now presenting with lightheadedness, dizziness with sodium levels 116.  He was likely overdiuresed which led to acute worsening of his hyponatremia -IV fluid -Trend BMP every 4 hours, goal sodium 126 in the next 24 hours (4/9 around 2 PM)  Alcoholic cirrhosis history of recurrent ascites,  portal gastropathy, duodenal/colonic AVM -Hold Lasix and spironolactone for now -Continue Toprol, Prilosec  Chronic atrial flutter/atrial fibrillation -Status post ablation  Bilateral lung nodule -Briefly reviewed his outpatient work-up thus far, has had bronchoscopy which revealed mucous glands, cytology negative for malignancy.  Currently planning for repeat imaging in about 3 months   DVT prophylaxis: SCD Code Status: Full code, discussed with patient at  time of admission Family Communication: No family at bedside, discussed plan of care with patient directly Disposition Plan: Pending improvement in his sodium level Consults called: None  Admission status: Observation   Severity of Illness: The appropriate patient status for this patient is OBSERVATION. Observation status is judged to be reasonable and necessary in order to provide the required intensity of service to ensure the patient's safety. The patient's presenting symptoms, physical exam findings, and initial radiographic and laboratory data in the context of their medical condition is felt to place them at decreased risk for further clinical deterioration. Furthermore, it is anticipated that the patient will be medically stable for discharge from the hospital within 2 midnights of admission. The following factors support the patient status of observation.   " The patient's presenting symptoms include dizziness, headache. " The physical exam findings include hypovolemia. " The initial radiographic and laboratory data are significant for sodium 116.    Dessa Phi, DO [facility] 08/25/2018, 3:09 PM    How to contact the [facility] Attending or Consulting provider [place] or covering provider during after hours [place], for this patient?  1. Check the care team in [facility] and look for a) attending/consulting [facility] provider listed and b) the [facility] team listed 2. Log into [email or web address] and use [place]'s universal password to  access. If you do not have the password, please contact the hospital operator. 3. Locate the [facility] provider you are looking for under [facility] and page to a number that you can be directly reached. 4. If you still have difficulty reaching the provider, please page the [facility] (Director on Call) for the Hospitalists listed on amion for assistance.

## 2018-08-26 ENCOUNTER — Encounter (HOSPITAL_COMMUNITY): Payer: Self-pay

## 2018-08-26 DIAGNOSIS — R911 Solitary pulmonary nodule: Secondary | ICD-10-CM | POA: Diagnosis not present

## 2018-08-26 DIAGNOSIS — Z8042 Family history of malignant neoplasm of prostate: Secondary | ICD-10-CM | POA: Diagnosis not present

## 2018-08-26 DIAGNOSIS — K766 Portal hypertension: Secondary | ICD-10-CM | POA: Diagnosis present

## 2018-08-26 DIAGNOSIS — K7460 Unspecified cirrhosis of liver: Secondary | ICD-10-CM | POA: Diagnosis present

## 2018-08-26 DIAGNOSIS — K3189 Other diseases of stomach and duodenum: Secondary | ICD-10-CM | POA: Diagnosis present

## 2018-08-26 DIAGNOSIS — F419 Anxiety disorder, unspecified: Secondary | ICD-10-CM | POA: Diagnosis present

## 2018-08-26 DIAGNOSIS — E785 Hyperlipidemia, unspecified: Secondary | ICD-10-CM | POA: Diagnosis present

## 2018-08-26 DIAGNOSIS — I4892 Unspecified atrial flutter: Secondary | ICD-10-CM | POA: Diagnosis present

## 2018-08-26 DIAGNOSIS — K7031 Alcoholic cirrhosis of liver with ascites: Secondary | ICD-10-CM | POA: Diagnosis present

## 2018-08-26 DIAGNOSIS — I10 Essential (primary) hypertension: Secondary | ICD-10-CM | POA: Diagnosis present

## 2018-08-26 DIAGNOSIS — T501X5A Adverse effect of loop [high-ceiling] diuretics, initial encounter: Secondary | ICD-10-CM | POA: Diagnosis present

## 2018-08-26 DIAGNOSIS — K5900 Constipation, unspecified: Secondary | ICD-10-CM | POA: Diagnosis present

## 2018-08-26 DIAGNOSIS — Z79899 Other long term (current) drug therapy: Secondary | ICD-10-CM | POA: Diagnosis not present

## 2018-08-26 DIAGNOSIS — E8770 Fluid overload, unspecified: Secondary | ICD-10-CM | POA: Diagnosis present

## 2018-08-26 DIAGNOSIS — Z888 Allergy status to other drugs, medicaments and biological substances status: Secondary | ICD-10-CM | POA: Diagnosis not present

## 2018-08-26 DIAGNOSIS — E871 Hypo-osmolality and hyponatremia: Secondary | ICD-10-CM | POA: Diagnosis present

## 2018-08-26 DIAGNOSIS — Z823 Family history of stroke: Secondary | ICD-10-CM | POA: Diagnosis not present

## 2018-08-26 DIAGNOSIS — Z8249 Family history of ischemic heart disease and other diseases of the circulatory system: Secondary | ICD-10-CM | POA: Diagnosis not present

## 2018-08-26 DIAGNOSIS — K219 Gastro-esophageal reflux disease without esophagitis: Secondary | ICD-10-CM | POA: Diagnosis present

## 2018-08-26 DIAGNOSIS — Z8371 Family history of colonic polyps: Secondary | ICD-10-CM | POA: Diagnosis not present

## 2018-08-26 DIAGNOSIS — I482 Chronic atrial fibrillation, unspecified: Secondary | ICD-10-CM | POA: Diagnosis present

## 2018-08-26 DIAGNOSIS — F1011 Alcohol abuse, in remission: Secondary | ICD-10-CM | POA: Diagnosis present

## 2018-08-26 DIAGNOSIS — E119 Type 2 diabetes mellitus without complications: Secondary | ICD-10-CM | POA: Diagnosis present

## 2018-08-26 DIAGNOSIS — E861 Hypovolemia: Secondary | ICD-10-CM | POA: Diagnosis present

## 2018-08-26 DIAGNOSIS — Z882 Allergy status to sulfonamides status: Secondary | ICD-10-CM | POA: Diagnosis not present

## 2018-08-26 DIAGNOSIS — R188 Other ascites: Secondary | ICD-10-CM | POA: Diagnosis present

## 2018-08-26 DIAGNOSIS — R918 Other nonspecific abnormal finding of lung field: Secondary | ICD-10-CM | POA: Diagnosis present

## 2018-08-26 LAB — BASIC METABOLIC PANEL
Anion gap: 10 (ref 5–15)
Anion gap: 12 (ref 5–15)
Anion gap: 9 (ref 5–15)
Anion gap: 9 (ref 5–15)
BUN: 19 mg/dL (ref 8–23)
BUN: 24 mg/dL — ABNORMAL HIGH (ref 8–23)
BUN: 27 mg/dL — ABNORMAL HIGH (ref 8–23)
BUN: 31 mg/dL — ABNORMAL HIGH (ref 8–23)
CO2: 19 mmol/L — ABNORMAL LOW (ref 22–32)
CO2: 21 mmol/L — ABNORMAL LOW (ref 22–32)
CO2: 22 mmol/L (ref 22–32)
CO2: 24 mmol/L (ref 22–32)
Calcium: 8.5 mg/dL — ABNORMAL LOW (ref 8.9–10.3)
Calcium: 8.8 mg/dL — ABNORMAL LOW (ref 8.9–10.3)
Calcium: 8.8 mg/dL — ABNORMAL LOW (ref 8.9–10.3)
Calcium: 9 mg/dL (ref 8.9–10.3)
Chloride: 84 mmol/L — ABNORMAL LOW (ref 98–111)
Chloride: 86 mmol/L — ABNORMAL LOW (ref 98–111)
Chloride: 86 mmol/L — ABNORMAL LOW (ref 98–111)
Chloride: 88 mmol/L — ABNORMAL LOW (ref 98–111)
Creatinine, Ser: 0.92 mg/dL (ref 0.61–1.24)
Creatinine, Ser: 0.99 mg/dL (ref 0.61–1.24)
Creatinine, Ser: 1.07 mg/dL (ref 0.61–1.24)
Creatinine, Ser: 1.16 mg/dL (ref 0.61–1.24)
GFR calc Af Amer: >60 mL/min (ref >60)
GFR calc Af Amer: >60 mL/min (ref >60)
GFR calc Af Amer: >60 mL/min (ref >60)
GFR calc Af Amer: >60 mL/min (ref >60)
GFR calc non Af Amer: >60 mL/min (ref >60)
GFR calc non Af Amer: >60 mL/min (ref >60)
GFR calc non Af Amer: >60 mL/min (ref >60)
GFR calc non Af Amer: >60 mL/min (ref >60)
Glucose, Bld: 111 mg/dL — ABNORMAL HIGH (ref 70–99)
Glucose, Bld: 111 mg/dL — ABNORMAL HIGH (ref 70–99)
Glucose, Bld: 125 mg/dL — ABNORMAL HIGH (ref 70–99)
Glucose, Bld: 182 mg/dL — ABNORMAL HIGH (ref 70–99)
Potassium: 4.3 mmol/L (ref 3.5–5.1)
Potassium: 4.4 mmol/L (ref 3.5–5.1)
Potassium: 4.8 mmol/L (ref 3.5–5.1)
Potassium: 5 mmol/L (ref 3.5–5.1)
Sodium: 117 mmol/L — CL (ref 135–145)
Sodium: 117 mmol/L — CL (ref 135–145)
Sodium: 117 mmol/L — CL (ref 135–145)
Sodium: 119 mmol/L — CL (ref 135–145)

## 2018-08-26 LAB — CBC
HCT: 41 % (ref 39.0–52.0)
Hemoglobin: 14.8 g/dL (ref 13.0–17.0)
MCH: 30.9 pg (ref 26.0–34.0)
MCHC: 36.1 g/dL — ABNORMAL HIGH (ref 30.0–36.0)
MCV: 85.6 fL (ref 80.0–100.0)
Platelets: 134 10*3/uL — ABNORMAL LOW (ref 150–400)
RBC: 4.79 MIL/uL (ref 4.22–5.81)
RDW: 14.6 % (ref 11.5–15.5)
WBC: 4.7 10*3/uL (ref 4.0–10.5)
nRBC: 0 % (ref 0.0–0.2)

## 2018-08-26 LAB — SODIUM, URINE, RANDOM: Sodium, Ur: 106 mmol/L

## 2018-08-26 MED ORDER — VITAMIN B-1 100 MG PO TABS
100.0000 mg | ORAL_TABLET | Freq: Every day | ORAL | Status: DC
  Administered 2018-08-26 – 2018-08-31 (×6): 100 mg via ORAL
  Filled 2018-08-26 (×7): qty 1

## 2018-08-26 MED ORDER — TRAMADOL HCL 50 MG PO TABS
50.0000 mg | ORAL_TABLET | Freq: Once | ORAL | Status: AC
  Administered 2018-08-26: 50 mg via ORAL
  Filled 2018-08-26: qty 1

## 2018-08-26 MED ORDER — SODIUM CHLORIDE 1 G PO TABS
1.0000 g | ORAL_TABLET | Freq: Two times a day (BID) | ORAL | Status: DC
  Administered 2018-08-26 – 2018-08-27 (×3): 1 g via ORAL
  Filled 2018-08-26: qty 1

## 2018-08-26 MED ORDER — ADULT MULTIVITAMIN W/MINERALS CH
1.0000 | ORAL_TABLET | Freq: Every day | ORAL | Status: DC
  Administered 2018-08-26 – 2018-08-31 (×6): 1 via ORAL
  Filled 2018-08-26 (×6): qty 1

## 2018-08-26 MED ORDER — THIAMINE HCL 100 MG/ML IJ SOLN
100.0000 mg | Freq: Every day | INTRAMUSCULAR | Status: DC
  Filled 2018-08-26: qty 2

## 2018-08-26 MED ORDER — FOLIC ACID 1 MG PO TABS
1.0000 mg | ORAL_TABLET | Freq: Every day | ORAL | Status: DC
  Administered 2018-08-26 – 2018-08-31 (×6): 1 mg via ORAL
  Filled 2018-08-26 (×6): qty 1

## 2018-08-26 MED ORDER — LORAZEPAM 1 MG PO TABS: 1.0000 mg | ORAL_TABLET | Freq: Four times a day (QID) | ORAL | Status: AC | PRN

## 2018-08-26 MED ORDER — LORAZEPAM 2 MG/ML IJ SOLN: 1.0000 mg | Freq: Four times a day (QID) | INTRAMUSCULAR | Status: AC | PRN

## 2018-08-26 NOTE — Progress Notes (Signed)
Pt with a critical sodium of 117. Paged provider smith to notify and to inform of dietitian stating pt needs sodium with meals.

## 2018-08-26 NOTE — Progress Notes (Signed)
Patient alert and oritented, denies pain with assess.  Critical lab values received throughout shift for Hyponatremia 116, 117 and 119.  Hopitalist Baltazar Najjar NP notified.  Normal saline 0.9% increased to 156ml/hr.  Stable condition at end of shift, will continue to monitor.

## 2018-08-26 NOTE — Progress Notes (Signed)
CRITICAL VALUE ALERT  Critical Value:  Na 119  Date & Time Notied:  08/26/18 at 0531  Provider Notified: Kirby,NP   Orders Received/Actions taken: No new orders at this time.

## 2018-08-26 NOTE — Progress Notes (Addendum)
[facility] PROGRESS NOTE  [name] ZOX:[id number] DOB: 06/16/1940 DOA: 08/25/2018 PCP: Shon Baton, MD  Assessment/Plan: Acute on chronic hyponatremia Patient has chronic hypervolemic hyponatremia with his sodium levels ranging between 124-131.  Patient's Lasix and spironolactone dosing was increased last month in effort to try and decrease frequency with which paracentesis needed. Had been monthly. Patient now with lightheadedness, dizziness with sodium levels 116.  He was likely overdiuresed which led to acute worsening of his hyponatremia. Sodium level trending up slightly then decreasing.  - continue IV fluid at 180ml/hr -will add 1gm salt tab bid with meals -recheck bmp 10pm. - goal sodium 126 in the next 24 hours (4/9 around 2 PM) -continue to hold home lasix and spironolactone  Alcoholic cirrhosis history of recurrent ascites, portal gastropathy, duodenal/colonic AVM. Was needing monthly paracentsis. Most recent 07/23/18 with 1.2L removed. Lasix and spironolactone increased to try and decrease frequency of paracentesis. Abdomen remains soft. Pt reports some increase in size.  -Holding Lasix and spironolactone for now -Continue Toprol, Prilosec -abdominal girth daily  Chronic atrial flutter/atrial fibrillation -Status post ablation -continue BB  Bilateral lung nodule -Briefly reviewed his outpatient work-up thus far, has had bronchoscopy which revealed mucous glands, cytology negative for malignancy.  Currently planning for repeat imaging in about 3 months  Code Status: full Family Communication: none present Disposition Plan: home when ready   Consultants:    Procedures:    Antibiotics:    HPI/Subjective: [name] is a 78 y.o. male hx of alcohol cirrhosis with refractory ascites requiring monthly paracentsis, afib not on blood thinners, presented with hyponatremia.  Chart review indicates patient getting monthly paracentesis and his GI doctor increased  his lasix to 80 mg in the morning and 40 mg in the evening and doubled spironolactone in response to patient complaints of needing paracentesis so frequently. Patient developed diffuse weakness.  He also noticed that he has been urinating very frequently and felt thirsty all the time.  He also had some headaches as well.  Of note, patient did have a recent lung biopsy that did not show small cell lung cancer.  He also had a PET scan that was unremarkable as well.  Patient stated that he no longer drinks alcohol.  He denied any sick contacts or fevers or vomiting. Patient went to see PCP and his sodium is low so sent for evaluation.   Objective: Vitals:   08/25/18 2151 08/26/18 0620  BP: 124/80 99/63  Pulse: 66 60  Resp: 16 18  Temp: 97.8 F (36.6 C) 97.9 F (36.6 C)  SpO2: 99% 100%    Intake/Output Summary (Last 24 hours) at 08/26/2018 0902 Last data filed at 08/26/2018 0345 Gross per 24 hour  Intake -  Output 800 ml  Net -800 ml   Filed Weights   08/25/18 1309  Weight: 73.9 kg    Exam:   General:  Awake alert slightly pale and very HOH. No acute distress  Cardiovascular: irregularly irregular no MGR no LE edema  Respiratory: normal effort BS clear bilaterally no wheeze  Abdomen: obese soft +BS no guarding or rebounding. +fluid wave  Musculoskeletal: joints without swelling/erythema   Data Reviewed: Basic Metabolic Panel: Recent Labs  Lab 08/25/18 1618 08/25/18 2003 08/25/18 2335 08/26/18 0432 08/26/18 0714  NA 117* 116* 117* 119* 117*  K 5.3* 4.6 4.8 4.3 4.4  CL 80* 81* 84* 86* 86*  CO2 19* 24 24 21* 22  GLUCOSE 95 123* 111* 111* 125*  BUN 34*  33* 31* 27* 24*  CREATININE 1.12 1.19 1.16 0.99 0.92  CALCIUM 9.8 9.0 8.8* 9.0 8.8*   Liver Function Tests: Recent Labs  Lab 08/25/18 1316  AST 31  ALT 26  ALKPHOS 118  BILITOT 2.3*  PROT 8.1  ALBUMIN 3.8   No results for input(s): LIPASE, AMYLASE in the last 168 hours. No results for input(s): AMMONIA in the  last 168 hours. CBC: Recent Labs  Lab 08/25/18 1316 08/26/18 0432  WBC 5.6 4.7  NEUTROABS 4.0  --   HGB 16.3 14.8  HCT 44.6 41.0  MCV 85.8 85.6  PLT 173 134*   Cardiac Enzymes: No results for input(s): CKTOTAL, CKMB, CKMBINDEX, TROPONINI in the last 168 hours. BNP (last 3 results) Recent Labs    08/25/18 1316  BNP 586.3*    ProBNP (last 3 results) No results for input(s): PROBNP in the last 8760 hours.  CBG: No results for input(s): GLUCAP in the last 168 hours.  No results found for this or any previous visit (from the past 240 hour(s)).   Studies: Ct Head Wo Contrast  Result Date: 08/25/2018 CLINICAL DATA:  Headache EXAM: CT HEAD WITHOUT CONTRAST TECHNIQUE: Contiguous axial images were obtained from the base of the skull through the vertex without intravenous contrast. COMPARISON:  None. FINDINGS: Brain: Mild age related volume loss. No acute intracranial abnormality. Specifically, no hemorrhage, hydrocephalus, mass lesion, acute infarction, or significant intracranial injury. Vascular: No hyperdense vessel or unexpected calcification. Skull: No acute calvarial abnormality. Sinuses/Orbits: Visualized paranasal sinuses and mastoids clear. Orbital soft tissues unremarkable. Other: None IMPRESSION: No acute intracranial abnormality. Electronically Signed   By: Rolm Baptise M.D.   On: 08/25/2018 14:24   Dg Chest Port 1 View  Result Date: 08/25/2018 CLINICAL DATA:  Low sodium.  No chest complaints. EXAM: PORTABLE CHEST 1 VIEW COMPARISON:  07/26/2018 FINDINGS: Cardiac silhouette is normal in size. No mediastinal or hilar masses. No evidence of adenopathy. Lungs are hyperexpanded. Prominent bronchovascular markings are noted most evident in the lower lungs, stable. Mild linear scarring noted at the right lung base. No evidence of pneumonia or pulmonary edema. No pleural effusion or pneumothorax. Skeletal structures are demineralized but grossly intact. IMPRESSION: 1. No acute  cardiopulmonary disease. Electronically Signed   By: Lajean Manes M.D.   On: 08/25/2018 15:36    Scheduled Meds: . pantoprazole  40 mg Oral BID   Continuous Infusions: . sodium chloride 100 mL/hr at 08/26/18 0345    Principal Problem:   Hyponatremia Active Problems:   Cirrhosis, alcoholic (HCC)   Cirrhosis of liver with ascites (HCC)   Essential hypertension   Atrial flutter (HCC)   Lung nodule    Time spent: 38 minutes    [place] NP  [facility]  If 7PM-7AM, please contact night-coverage at [email or web address], password [facility] 08/26/2018, 9:02 AM  LOS: 0 days

## 2018-08-26 NOTE — Care Management Obs Status (Signed)
[place] NOTIFICATION   Patient Details  Name: [name] MRN: [id number] Date of Birth: 14-Apr-1941   Medicare Observation Status Notification Given:  Yes    Carles Collet, RN 08/26/2018, 10:28 AM

## 2018-08-26 NOTE — Progress Notes (Signed)
CRITICAL VALUE ALERT  Critical Value:  Na 117  Date & Time Notied:  08/26/2018 2021  Provider Notified: Alger Memos NP  Orders Received/Actions taken: No new orders at this time

## 2018-08-27 LAB — BASIC METABOLIC PANEL
Anion gap: 10 (ref 5–15)
Anion gap: 10 (ref 5–15)
BUN: 16 mg/dL (ref 8–23)
BUN: 13 mg/dL (ref 8–23)
CO2: 20 mmol/L — ABNORMAL LOW (ref 22–32)
CO2: 21 mmol/L — ABNORMAL LOW (ref 22–32)
Calcium: 9.2 mg/dL (ref 8.9–10.3)
Calcium: 8.9 mg/dL (ref 8.9–10.3)
Chloride: 86 mmol/L — ABNORMAL LOW (ref 98–111)
Chloride: 89 mmol/L — ABNORMAL LOW (ref 98–111)
Creatinine, Ser: 0.95 mg/dL (ref 0.61–1.24)
Creatinine, Ser: 1.02 mg/dL (ref 0.61–1.24)
GFR calc Af Amer: >60 mL/min (ref >60)
GFR calc Af Amer: >60 mL/min (ref >60)
GFR calc non Af Amer: >60 mL/min (ref >60)
GFR calc non Af Amer: >60 mL/min (ref >60)
Glucose, Bld: 157 mg/dL — ABNORMAL HIGH (ref 70–99)
Glucose, Bld: 147 mg/dL — ABNORMAL HIGH (ref 70–99)
Potassium: 5.1 mmol/L (ref 3.5–5.1)
Potassium: 4.2 mmol/L (ref 3.5–5.1)
Sodium: 116 mmol/L — CL (ref 135–145)
Sodium: 120 mmol/L — ABNORMAL LOW (ref 135–145)

## 2018-08-27 LAB — OSMOLALITY: Osmolality: 254 mOsm/kg — ABNORMAL LOW (ref 275–295)

## 2018-08-27 MED ORDER — SORBITOL 70 % SOLN
960.0000 mL | TOPICAL_OIL | Freq: Once | ORAL | Status: AC
  Administered 2018-08-27: 960 mL via RECTAL
  Filled 2018-08-27: qty 473

## 2018-08-27 MED ORDER — SODIUM CHLORIDE 1 G PO TABS
1.0000 g | ORAL_TABLET | Freq: Three times a day (TID) | ORAL | Status: DC
  Administered 2018-08-27 – 2018-08-28 (×4): 1 g via ORAL
  Filled 2018-08-27 (×4): qty 1

## 2018-08-27 MED ORDER — MAGNESIUM HYDROXIDE 400 MG/5ML PO SUSP
30.0000 mL | Freq: Once | ORAL | Status: AC
  Administered 2018-08-27: 30 mL via ORAL
  Filled 2018-08-27: qty 30

## 2018-08-27 MED ORDER — SENNOSIDES-DOCUSATE SODIUM 8.6-50 MG PO TABS
1.0000 | ORAL_TABLET | Freq: Two times a day (BID) | ORAL | Status: AC
  Administered 2018-08-27 – 2018-08-28 (×4): 1 via ORAL
  Filled 2018-08-27 (×4): qty 1

## 2018-08-27 NOTE — Progress Notes (Signed)
[facility] PROGRESS NOTE  [name] UXN:[id number] DOB: 1940/08/10 DOA: 08/25/2018 PCP: Shon Baton, MD  Assessment/Plan: Acute on chronic hyponatremia. Sodium level trending down slightly. Serum osmolality 250. Urine sodium within limits of normal.  Patient has chronic hypervolemic hyponatremia with his sodium levels ranging between 124-131. Patient's Lasix and spironolactone dosing was increased last month in effort to try and decrease frequency with which paracentesis needed. Had been monthly. He was likely overdiuresed which led to acute worsening of his hyponatremia.   - will stop IV fluids -fluid restriction -increase 1gm salt tab to tid with meals -recheck bmp 8pm. - goal sodium 126 in the next 24 hours(4/9 around 2 PM) -continue to hold home lasix and spironolactone  Alcoholic cirrhosishistory of recurrent ascites, portal gastropathy, duodenal/colonic AVM. Was needing monthly paracentsis. Most recent 07/23/18 with 1.2L removed. Lasix and spironolactone increased to try and decrease frequency of paracentesis. Abdomen remains soft. Pt reports some increase in size.  -Holding Lasix and spironolactone for now -Continue Toprol, Prilosec -abdominal girth daily -CIWA protocol no s/sx withdrawal  Chronic atrial flutter/atrial fibrillation -Status post ablation -continue BB  Bilateral lung nodule -Briefly reviewed his outpatient work-up thus far, has had bronchoscopy which revealed mucous glands, cytology negative for malignancy.Currently planning for repeat imaging in about 3 months  Constipation.  -MOM x1 -colace bid -monitor   Code Status: full Family Communication: none present Disposition Plan: home when clinically ready   Consultants:    Procedures:    Antibiotics:    HPI/Subjective: 78 yo with hx etoh cirrhosis, aflutter, dm, hyponatremia, lung nodule admitted with symptomatic worsening hyponatremia. Recently lasix and spironolactone increased to  try and decrease frequency with which paracentesis needed (monthly).  Objective: Vitals:   08/26/18 1347 08/27/18 0435  BP: 112/82 128/79  Pulse: (!) 55 74  Resp: 18 18  Temp: 98.1 F (36.7 C) 98.2 F (36.8 C)  SpO2: 100% 100%    Intake/Output Summary (Last 24 hours) at 08/27/2018 1020 Last data filed at 08/26/2018 2300 Gross per 24 hour  Intake 3522.48 ml  Output 1600 ml  Net 1922.48 ml   Filed Weights   08/25/18 1309  Weight: 73.9 kg    Exam:   General:  Awake alert no acute distress  Cardiovascular: rrr no mgr trace LE edema  Respiratory: normal effort BS clear bilaterally no wheeze  Abdomen: obese soft +Fluid wave, +BS non-tender to palpation. No guarding or rebounding  Musculoskeletal: joints without swelling/erythema   Data Reviewed: Basic Metabolic Panel: Recent Labs  Lab 08/25/18 2335 08/26/18 0432 08/26/18 0714 08/26/18 1939 08/27/18 0225  NA 117* 119* 117* 117* 116*  K 4.8 4.3 4.4 5.0 5.1  CL 84* 86* 86* 88* 86*  CO2 24 21* 22 19* 20*  GLUCOSE 111* 111* 125* 182* 157*  BUN 31* 27* 24* 19 16  CREATININE 1.16 0.99 0.92 1.07 0.95  CALCIUM 8.8* 9.0 8.8* 8.5* 9.2   Liver Function Tests: Recent Labs  Lab 08/25/18 1316  AST 31  ALT 26  ALKPHOS 118  BILITOT 2.3*  PROT 8.1  ALBUMIN 3.8   No results for input(s): LIPASE, AMYLASE in the last 168 hours. No results for input(s): AMMONIA in the last 168 hours. CBC: Recent Labs  Lab 08/25/18 1316 08/26/18 0432  WBC 5.6 4.7  NEUTROABS 4.0  --   HGB 16.3 14.8  HCT 44.6 41.0  MCV 85.8 85.6  PLT 173 134*   Cardiac Enzymes: No results for input(s): CKTOTAL, CKMB, CKMBINDEX, TROPONINI in the  last 168 hours. BNP (last 3 results) Recent Labs    08/25/18 1316  BNP 586.3*    ProBNP (last 3 results) No results for input(s): PROBNP in the last 8760 hours.  CBG: No results for input(s): GLUCAP in the last 168 hours.  No results found for this or any previous visit (from the past 240  hour(s)).   Studies: Ct Head Wo Contrast  Result Date: 08/25/2018 CLINICAL DATA:  Headache EXAM: CT HEAD WITHOUT CONTRAST TECHNIQUE: Contiguous axial images were obtained from the base of the skull through the vertex without intravenous contrast. COMPARISON:  None. FINDINGS: Brain: Mild age related volume loss. No acute intracranial abnormality. Specifically, no hemorrhage, hydrocephalus, mass lesion, acute infarction, or significant intracranial injury. Vascular: No hyperdense vessel or unexpected calcification. Skull: No acute calvarial abnormality. Sinuses/Orbits: Visualized paranasal sinuses and mastoids clear. Orbital soft tissues unremarkable. Other: None IMPRESSION: No acute intracranial abnormality. Electronically Signed   By: Rolm Baptise M.D.   On: 08/25/2018 14:24   Dg Chest Port 1 View  Result Date: 08/25/2018 CLINICAL DATA:  Low sodium.  No chest complaints. EXAM: PORTABLE CHEST 1 VIEW COMPARISON:  07/26/2018 FINDINGS: Cardiac silhouette is normal in size. No mediastinal or hilar masses. No evidence of adenopathy. Lungs are hyperexpanded. Prominent bronchovascular markings are noted most evident in the lower lungs, stable. Mild linear scarring noted at the right lung base. No evidence of pneumonia or pulmonary edema. No pleural effusion or pneumothorax. Skeletal structures are demineralized but grossly intact. IMPRESSION: 1. No acute cardiopulmonary disease. Electronically Signed   By: Lajean Manes M.D.   On: 08/25/2018 15:36    Scheduled Meds: . folic acid  1 mg Oral Daily  . multivitamin with minerals  1 tablet Oral Daily  . pantoprazole  40 mg Oral BID  . senna-docusate  1 tablet Oral BID  . sodium chloride  1 g Oral BID WC  . thiamine  100 mg Oral Daily   Or  . thiamine  100 mg Intravenous Daily   Continuous Infusions:  Principal Problem:   Hyponatremia Active Problems:   Cirrhosis, alcoholic (HCC)   Cirrhosis of liver with ascites (HCC)   Essential hypertension   Atrial  flutter (HCC)   Lung nodule    Time spent: 80 minutes    [place] NP  [facility]  If 7PM-7AM, please contact night-coverage at [email or web address], password [facility] 08/27/2018, 10:20 AM  LOS: 1 day

## 2018-08-27 NOTE — Progress Notes (Signed)
CRITICAL VALUE ALERT  Critical Value:  Na 116  Date & Time Notied:  08/27/2018  0355  Provider Notified: Alger Memos NP  Orders Received/Actions taken: Awaiting orders

## 2018-08-28 DIAGNOSIS — K5900 Constipation, unspecified: Secondary | ICD-10-CM

## 2018-08-28 LAB — ACTH STIMULATION, 3 TIME POINTS
Cortisol, 30 Min: 20.7 ug/dL
Cortisol, 60 Min: 23.8 ug/dL
Cortisol, Base: 13.5 ug/dL

## 2018-08-28 LAB — BASIC METABOLIC PANEL
Anion gap: 12 (ref 5–15)
BUN: 13 mg/dL (ref 8–23)
CO2: 20 mmol/L — ABNORMAL LOW (ref 22–32)
Calcium: 9 mg/dL (ref 8.9–10.3)
Chloride: 86 mmol/L — ABNORMAL LOW (ref 98–111)
Creatinine, Ser: 0.82 mg/dL (ref 0.61–1.24)
GFR calc Af Amer: >60 mL/min (ref >60)
GFR calc non Af Amer: >60 mL/min (ref >60)
Glucose, Bld: 125 mg/dL — ABNORMAL HIGH (ref 70–99)
Potassium: 5.1 mmol/L (ref 3.5–5.1)
Sodium: 118 mmol/L — CL (ref 135–145)

## 2018-08-28 LAB — TSH: TSH: 1.317 u[IU]/mL (ref 0.350–4.500)

## 2018-08-28 MED ORDER — COSYNTROPIN 0.25 MG IJ SOLR: 0.2500 mg | Freq: Once | INTRAMUSCULAR | Status: DC

## 2018-08-28 MED ORDER — FUROSEMIDE 40 MG PO TABS: 40.0000 mg | ORAL_TABLET | Freq: Two times a day (BID) | ORAL | Status: DC

## 2018-08-28 MED ORDER — COSYNTROPIN 0.25 MG IJ SOLR
0.2500 mg | Freq: Once | INTRAMUSCULAR | Status: AC
  Administered 2018-08-28: 07:00:00 0.25 mg via INTRAVENOUS
  Filled 2018-08-28 (×2): qty 0.25

## 2018-08-28 MED ORDER — FUROSEMIDE 40 MG PO TABS
40.0000 mg | ORAL_TABLET | Freq: Two times a day (BID) | ORAL | Status: DC
  Administered 2018-08-28 – 2018-08-31 (×7): 40 mg via ORAL
  Filled 2018-08-28 (×8): qty 1

## 2018-08-28 MED ORDER — ENOXAPARIN SODIUM 40 MG/0.4ML ~~LOC~~ SOLN
40.0000 mg | SUBCUTANEOUS | Status: DC
  Administered 2018-08-28 – 2018-08-30 (×3): 40 mg via SUBCUTANEOUS
  Filled 2018-08-28 (×3): qty 0.4

## 2018-08-28 MED ORDER — SODIUM CHLORIDE 1 G PO TABS: 1.0000 g | ORAL_TABLET | Freq: Every day | ORAL | Status: DC

## 2018-08-28 MED ORDER — SORBITOL 70 % SOLN
960.0000 mL | TOPICAL_OIL | Freq: Once | ORAL | Status: DC | PRN
  Filled 2018-08-28: qty 473

## 2018-08-28 MED ORDER — LINACLOTIDE 145 MCG PO CAPS
145.0000 ug | ORAL_CAPSULE | Freq: Every day | ORAL | Status: DC
  Administered 2018-08-28 – 2018-08-30 (×3): 145 ug via ORAL
  Filled 2018-08-28 (×4): qty 1

## 2018-08-28 NOTE — Progress Notes (Signed)
CRITICAL VALUE ALERT  Critical Value:  Sodium 118  Date & Time Notied:  08/28/18 at 0550  Provider Notified: Silas Sacramento, NP  Orders Received/Actions taken: No new orders at this time

## 2018-08-28 NOTE — Progress Notes (Signed)
[facility]  HISTORY AND PHYSICAL  ATSUSHI YOM is an 78 y.o. male.    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 Harvest Forest for evaluation and recommendations surrounding hyponatremia.  Pt was admitted 08/25/2018 after being evaluated by his PCP for dizziness.  Noted that about a month ago lasix and aldactone were increased to 80 Lasix BID and aldactone 200 q AM and 100 q PM. Last paracentesis 3/6 with 1.2L fluid removed.  Pt has chronic hyponatremia with last Na 125 07/2018.   In ED, Na upon presentation was 116.  Lasix and aldactone were held.  Initially got isotonic saline with Na rising to 119 the morning after admission but falling back down to 116 about 36 hours.  IVFs were stopped and salt tabs 1 g TID started.  Na was 120 yesterday and 118 today and in this setting we are consulted.    Urine osms 472 4/8 at 1500, serum osms 250.  Urine sodium collected about 12 hours after (after fluids started) was 106.  Cosyntropin test OK.    Pt reports he "couldn't think" on admission and how he's doing well.  Hasn't had SBP or HE per his report.  PMH: Past Medical History:  Diagnosis Date  . Adenomatous polyps 06/2004  . Agent orange exposure   . Alcoholic cirrhosis of liver with ascites ([place])   . Alcoholism ([place])   . Allergy   . Anxiety    PTSD- no meds  . Atrial fibrillation ([place])   . Atrial flutter ([place])    had ablation   . Chronic kidney disease    "beginnings of kidney failure"- 3-4 years ago  . Depression   . Diabetes mellitus without complication (HCC)    no meds  . Diverticulosis   . GERD (gastroesophageal reflux disease)   . Hemorrhoids   . Hiatal hernia   . Hx of hernia repair   . Hyperlipidemia   . Hypertension   . Iron deficiency anemia   . Lung nodules    bilateral  . Pedal edema   . Pneumonia    06-2016  .  PTSD (post-traumatic stress disorder)   . Substance abuse ([place])    alcohol use  . Ulcer   . Varicose veins   . Wears dentures   . Wears glasses   . Wears hearing aid    B/L   PSH: Past Surgical History:  Procedure Laterality Date  . ATRIAL ABLATION SURGERY    . COLONOSCOPY    . FUDUCIAL PLACEMENT N/A 07/26/2018   Procedure: PLACEMENT OF FUDUCIAL;  Surgeon: [name], MD;  Location: [place];  Service: Thoracic;  Laterality: N/A;  . IR PARACENTESIS  10/28/2016  . IR PARACENTESIS  11/12/2016  . IR PARACENTESIS  12/02/2016  . IR PARACENTESIS  12/10/2016  . IR PARACENTESIS  12/30/2016  . IR PARACENTESIS  01/27/2017  . IR PARACENTESIS  09/09/2017  . IR PARACENTESIS  03/04/2018  . IR PARACENTESIS  04/08/2018  . IR PARACENTESIS  05/10/2018  . IR PARACENTESIS  05/21/2018  . IR PARACENTESIS  06/16/2018  . IR PARACENTESIS  07/23/2018  . IR RADIOLOGIST EVAL & MGMT  06/22/2018  . POLYPECTOMY    . TONSILLECTOMY    . UMBILICAL HERNIA REPAIR    . VARICOSE VEIN SURGERY     x4  .  VIDEO BRONCHOSCOPY WITH ENDOBRONCHIAL NAVIGATION N/A 07/26/2018   Procedure: VIDEO BRONCHOSCOPY WITH ENDOBRONCHIAL NAVIGATION;  Surgeon: [name], MD;  Location: [place];  Service: Thoracic;  Laterality: N/A;   Past Medical History:  Diagnosis Date  . Adenomatous polyps 06/2004  . Agent orange exposure   . Alcoholic cirrhosis of liver with ascites ([place])   . Alcoholism ([place])   . Allergy   . Anxiety    PTSD- no meds  . Atrial fibrillation ([place])   . Atrial flutter ([place])    had ablation   . Chronic kidney disease    "beginnings of kidney failure"- 3-4 years ago  . Depression   . Diabetes mellitus without complication (HCC)    no meds  . Diverticulosis   . GERD (gastroesophageal reflux disease)   . Hemorrhoids   . Hiatal hernia   . Hx of hernia repair   . Hyperlipidemia   . Hypertension   . Iron deficiency anemia   . Lung nodules    bilateral  . Pedal edema   . Pneumonia    06-2016  . PTSD  (post-traumatic stress disorder)   . Substance abuse ([place])    alcohol use  . Ulcer   . Varicose veins   . Wears dentures   . Wears glasses   . Wears hearing aid    B/L    Medications:   Scheduled: . enoxaparin (LOVENOX) injection  40 mg Subcutaneous Q24H  . folic acid  1 mg Oral Daily  . furosemide  40 mg Oral BID  . linaclotide  145 mcg Oral QAC breakfast  . multivitamin with minerals  1 tablet Oral Daily  . pantoprazole  40 mg Oral BID  . senna-docusate  1 tablet Oral BID  . sodium chloride  1 g Oral TID WC  . thiamine  100 mg Oral Daily   Or  . thiamine  100 mg Intravenous Daily    Medications Prior to Admission  Medication Sig Dispense Refill  . furosemide (LASIX) 40 MG tablet TAKE 2 TABLETS BY MOUTH IN THE MORNING AND  1 IN THE EVENING (Patient taking differently: Take 40 mg by mouth 2 (two) times daily. ) 90 tablet 3  . hydrALAZINE (APRESOLINE) 25 MG tablet Take 25 mg by mouth at bedtime.     Marland Kitchen linaclotide (LINZESS) 145 MCG CAPS capsule Take 145 mcg by mouth daily as needed (for constipation).     . metoprolol (TOPROL XL) 50 MG 24 hr tablet Take 50 mg by mouth at bedtime.     Marland Kitchen omeprazole (PRILOSEC) 20 MG capsule Take 1 capsule (20 mg total) by mouth 2 (two) times daily. 60 capsule 5  . spironolactone (ALDACTONE) 100 MG tablet TAKE 2 TABLETS BY MOUTH ONCE DAILY IN THE MORNING AND 1 TABLET BY MOUTH IN THE EVENING (Patient taking differently: Take 100 mg by mouth 2 (two) times daily. ) 90 tablet 3  . tetrahydrozoline (VISINE) 0.05 % ophthalmic solution Place 2 drops into both eyes daily as needed (for dry eyes).      ALLERGIES:   Allergies  Allergen Reactions  . Lisinopril Anaphylaxis and Other (See Comments)    Hyperkalemia, Dizziness   . Lipitor [Atorvastatin] Other (See Comments)    Marked leg fatigue  . Sulfonamide Derivatives Other (See Comments)    UNSPECIFIED REACTION  [Unsure of reaction]    FAM HX: Family History  Problem Relation Age of Onset  .  Colon polyps Brother   . Prostate cancer Brother   .  Heart disease Father        rheumatic fever as child  . Stroke Mother   . Colon cancer Neg Hx   . Esophageal cancer Neg Hx   . Rectal cancer Neg Hx   . Stomach cancer Neg Hx     Social History:   reports that he has never smoked. He has never used smokeless tobacco. He reports current alcohol use. He reports that he does not use drugs.  ROS: ROS: all other systems reviewed and are negative except as per HPI  Blood pressure 104/61, pulse 62, temperature (!) 97.4 F (36.3 C), temperature source Oral, resp. rate 20, height 5\' 6"  (1.676 m), weight 73.9 kg, SpO2 99 %. PHYSICAL EXAM: Physical Exam  GEN chronically ill, NAD, sitting in bed HEENT + scleral icterus NECK + JVD with + HJR PULM normal WOB, clear bilaterally CV irregular, soft systolic murmur ABD + fluid wave EXT 1+ LE edema NEURO AAO x 3   Results for orders placed or performed during the hospital encounter of 08/25/18 (from the past 48 hour(s))  Basic metabolic panel     Status: Abnormal   Collection Time: 08/26/18  7:39 PM  Result Value Ref Range   Sodium 117 (LL) 135 - 145 mmol/L    Comment: CRITICAL RESULT CALLED TO, READ BACK BY AND VERIFIED WITH: J CARSON,RN 2017 08/26/2018 WBOND    Potassium 5.0 3.5 - 5.1 mmol/L   Chloride 88 (L) 98 - 111 mmol/L   CO2 19 (L) 22 - 32 mmol/L   Glucose, Bld 182 (H) 70 - 99 mg/dL   BUN 19 8 - 23 mg/dL   Creatinine, Ser 1.07 0.61 - 1.24 mg/dL   Calcium 8.5 (L) 8.9 - 10.3 mg/dL   GFR calc non Af Amer >60 >60 mL/min   GFR calc Af Amer >60 >60 mL/min   Anion gap 10 5 - 15    Comment: Performed at [facility], [place] [address] [place], [place] [address]  Sodium, urine, random     Status: None   Collection Time: 08/26/18  8:57 PM  Result Value Ref Range   Sodium, Ur 106 mmol/L    Comment: Performed at [facility] [address], [place], [place] [address]  Basic metabolic panel     Status: Abnormal   Collection  Time: 08/27/18  2:25 AM  Result Value Ref Range   Sodium 116 (LL) 135 - 145 mmol/L    Comment: CRITICAL RESULT CALLED TO, READ BACK BY AND VERIFIED WITH: WOOD J,RN 08/27/18 0353 WAYK    Potassium 5.1 3.5 - 5.1 mmol/L   Chloride 86 (L) 98 - 111 mmol/L   CO2 20 (L) 22 - 32 mmol/L   Glucose, Bld 157 (H) 70 - 99 mg/dL   BUN 16 8 - 23 mg/dL   Creatinine, Ser 0.95 0.61 - 1.24 mg/dL   Calcium 9.2 8.9 - 10.3 mg/dL   GFR calc non Af Amer >60 >60 mL/min   GFR calc Af Amer >60 >60 mL/min   Anion gap 10 5 - 15    Comment: Performed at [facility], [place] [address] [place], [place] [address]  Osmolality     Status: Abnormal   Collection Time: 08/27/18  2:25 AM  Result Value Ref Range   Osmolality 254 (L) 275 - 295 mOsm/kg    Comment: Performed at [facility], [place] [address], [place], [place] [address]  Basic metabolic panel  Status: Abnormal   Collection Time: 08/27/18  6:14 PM  Result Value Ref Range   Sodium 120 (L) 135 - 145 mmol/L   Potassium 4.2 3.5 - 5.1 mmol/L   Chloride 89 (L) 98 - 111 mmol/L   CO2 21 (L) 22 - 32 mmol/L   Glucose, Bld 147 (H) 70 - 99 mg/dL   BUN 13 8 - 23 mg/dL   Creatinine, Ser 1.02 0.61 - 1.24 mg/dL   Calcium 8.9 8.9 - 10.3 mg/dL   GFR calc non Af Amer >60 >60 mL/min   GFR calc Af Amer >60 >60 mL/min   Anion gap 10 5 - 15    Comment: Performed at [facility] [address], [place], [place] [address]  Basic metabolic panel     Status: Abnormal   Collection Time: 08/28/18  4:09 AM  Result Value Ref Range   Sodium 118 (LL) 135 - 145 mmol/L    Comment: CRITICAL RESULT CALLED TO, READ BACK BY AND VERIFIED WITH: MASLENJAK I,RN 08/28/18 0548 WAYK    Potassium 5.1 3.5 - 5.1 mmol/L   Chloride 86 (L) 98 - 111 mmol/L   CO2 20 (L) 22 - 32 mmol/L   Glucose, Bld 125 (H) 70 - 99 mg/dL   BUN 13 8 - 23 mg/dL   Creatinine, Ser 0.82 0.61 - 1.24 mg/dL   Calcium 9.0 8.9 - 10.3 mg/dL   GFR calc non Af Amer >60 >60 mL/min   GFR calc Af Amer >60 >60  mL/min   Anion gap 12 5 - 15    Comment: Performed at [facility], [place] [address], [place], [place] [address]  ACTH stimulation, 3 time points     Status: None   Collection Time: 08/28/18  8:17 AM  Result Value Ref Range   Cortisol, Base 13.5 ug/dL    Comment: NO NORMAL RANGE ESTABLISHED FOR THIS TEST   Cortisol, 30 Min 20.7 ug/dL   Cortisol, 60 Min 23.8 ug/dL    Comment: Performed at [facility], [place] [address] [place], [place] [address]    No results found.  Assessment/Plan  1.  Hyponatremia: given clincial history of cirrhosis, pt has a propensity to hypervolemic hyponatremia.  His urine osms are inappropriately high compared to serum osms.  Urine Na is high but this is after isotonic saline administration.  Clinically, pt is volume up.  Would continue fluid restriction as you are doing and would restart lasix 40 PO BID.  Would taper down salt tabs as really pt is total body Na and water up d/t decreased effective arterial blood flow with history of cirrhosis.  Net effect of salt tabs is increasing total body Na further which is a large driver of thirst and leads to worsening volume overload. 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  Cortisol OK, adding TSH.   2.  EtOH Cirrhosis: followed by GI.  Prn paracentesis, last one 07/23/2018.   3.  Afib: metoprolol  4.  Dispo: pending improvement   Lacinda Curvin 08/28/2018, 3:25 PM

## 2018-08-28 NOTE — Progress Notes (Signed)
ACTH stimulation 3 time points test ordered. Medication requested from pharmacy, not sent at this time. RN will reschedule med, and inform day shift RN to call lab once medication is available on the floor.

## 2018-08-28 NOTE — Progress Notes (Addendum)
Progress Note    [name]  [id number]:[id number] DOB: 25-Mar-1941  DOA: 08/25/2018 PCP: Shon Baton, MD    Brief Narrative:   Chief complaint: Low sodium  Medical records reviewed and are as summarized below:  [name] is an 78 y.o. male with past medical history significant for alcoholic cirrhosis, atrial flutter/fibrillation, diabetes mellitus type 2, hyponatremia, and history of lung nodules; who presents with complaints of abnormal sodium level after his gastroenterologist had increased his Lasix and spironolactone dosages.  Assessment/Plan:   Principal Problem:   Hyponatremia Active Problems:   Essential hypertension   Atrial flutter (HCC)   Cirrhosis, alcoholic (HCC)   Lung nodule   Cirrhosis of liver with ascites (HCC)  Acute on chronic hyponatremia acute on chronic.  Previously appeared to have hypervolemic hyponatremia with his sodium levels ranging between 124-131. Patient's Lasix and spironolactone dosing was increased last month in effort to try and decrease frequency with which paracentesis needed. Had been monthly.  Labs noted decreased urine osmolarity in relation to serum osmolarity, but obtained after initiation of IV fluids. Initially suspected patient was likely overdiuresed which led to acute worsening of his hyponatremia. Sodium level trending up slightly then decreasing with IV fluids.   On 4/10, questioned the possibility of SIADH given his history of pulmonary nodules although reported to be nonmalignant.  For which IV fluids were discontinued and have been fluid restricting.  Sodium slightly increased 120->118.  ACTH test revealed serum cortisol base 13.5, 30 minutes 20.7, 60-minute 23.8. -Discontinued 1gm salt tab bid with meals -Strict I&O's -Continue fluid restriction 1200 mL of fluid per day -Continue serial monitoring of BMP -Appreciate Dr. Hollie Salk of nephrology consulted for further recommendations  Alcoholic cirrhosishistory of recurrent ascites,  portal gastropathy, duodenal/colonic AVM. Was needing monthly paracentsis. Most recent 07/23/18 with 1.2L removed. Lasix and spironolactone increased to try and decrease frequency of paracentesis. Abdomen remains mildly distended. Pt reports some increase in size.   Patient reports that he still intermittently drinks alcohol. -CWIA protocol -Holding spironolactone for now -Continue Toprol, Prilosec -abdominal girth daily -Restart home Lasix per nephrology  Chronic atrial flutter/atrial fibrillation -Status post ablation -continue BB  Bilateral lung nodule -Briefly reviewed his outpatient work-up thus far, has had bronchoscopy which revealed mucous glands, cytology negative for malignancy.Currently planning for repeat imaging in about 3 months.  Constipation. Patient reports having small bowel movement after smog enema yesterday. -Continue home medication of Linzess  Body mass index is 26.31 kg/m.   Family Communication/Anticipated D/C date and plan/Code Status   DVT prophylaxis: Lovenox ordered. Code Status: Full Code.  Family Communication: No family present at bedside Disposition Plan: Possible discharge home once sodium levels greater than 120   Medical Consultants:    Nephrology Dr. Hollie Salk   Anti-Infectives:    None  Subjective:   Patient reports that he had a small bowel movement yesterday after enema.  Denies any complaints.  Objective:    Vitals:   08/27/18 0435 08/27/18 1519 08/27/18 2100 08/28/18 0530  BP: 128/79 130/77 127/64 118/63  Pulse: 74 78 63 60  Resp: 18 19 20 18   Temp: 98.2 F (36.8 C) 98.6 F (37 C) 98 F (36.7 C) 98 F (36.7 C)  TempSrc: Oral Oral Oral Oral  SpO2: 100% 98% 100% 97%  Weight:      Height:        Intake/Output Summary (Last 24 hours) at 08/28/2018 1156 Last data filed at 08/28/2018 1134 Gross per 24 hour  Intake 840 ml  Output 1800 ml  Net -960 ml   Filed Weights   08/25/18 1309  Weight: 73.9 kg    Exam:  Constitutional: Elderly male in NAD, calm, comfortable Eyes: PERRL, lids and conjunctivae normal ENMT: Mucous membranes are moist. Posterior pharynx clear of any exudate or lesions.  Neck: normal, supple, no masses, no thyromegaly Respiratory: clear to auscultation bilaterally, no wheezing, no crackles. Normal respiratory effort. No accessory muscle use.  Cardiovascular: Regular rate and rhythm, no murmurs / rubs / gallops.  Trace lower extremity edema. 2+ pedal pulses. No carotid bruits.  Abdomen: Mild abdominal distention.  No tenderness, no masses palpated. No hepatosplenomegaly. Bowel sounds positive.  Musculoskeletal: no clubbing / cyanosis. No joint deformity upper and lower extremities. Good ROM, no contractures. Normal muscle tone.  Skin: Venous stasis noted of the bilateral lower extremities Neurologic: CN 2-12 grossly intact.  Able to move all 4 extremities. Psychiatric: Normal judgment and insight. Alert and oriented x 3. Normal mood.    Data Reviewed:   I have personally reviewed following labs and imaging studies:  Labs: Labs show the following:   Basic Metabolic Panel: Recent Labs  Lab 08/26/18 0714 08/26/18 1939 08/27/18 0225 08/27/18 1814 08/28/18 0409  NA 117* 117* 116* 120* 118*  K 4.4 5.0 5.1 4.2 5.1  CL 86* 88* 86* 89* 86*  CO2 22 19* 20* 21* 20*  GLUCOSE 125* 182* 157* 147* 125*  BUN 24* 19 16 13 13   CREATININE 0.92 1.07 0.95 1.02 0.82  CALCIUM 8.8* 8.5* 9.2 8.9 9.0   GFR Estimated Creatinine Clearance: 70.2 mL/min (by C-G formula based on SCr of 0.82 mg/dL). Liver Function Tests: Recent Labs  Lab 08/25/18 1316  AST 31  ALT 26  ALKPHOS 118  BILITOT 2.3*  PROT 8.1  ALBUMIN 3.8   No results for input(s): LIPASE, AMYLASE in the last 168 hours. No results for input(s): AMMONIA in the last 168 hours. Coagulation profile No results for input(s): INR, PROTIME in the last 168 hours.  CBC: Recent Labs  Lab 08/25/18 1316 08/26/18 0432  WBC 5.6  4.7  NEUTROABS 4.0  --   HGB 16.3 14.8  HCT 44.6 41.0  MCV 85.8 85.6  PLT 173 134*   Cardiac Enzymes: No results for input(s): CKTOTAL, CKMB, CKMBINDEX, TROPONINI in the last 168 hours. BNP (last 3 results) No results for input(s): PROBNP in the last 8760 hours. CBG: No results for input(s): GLUCAP in the last 168 hours. D-Dimer: No results for input(s): DDIMER in the last 72 hours. Hgb A1c: No results for input(s): HGBA1C in the last 72 hours. Lipid Profile: No results for input(s): CHOL, HDL, LDLCALC, TRIG, CHOLHDL, LDLDIRECT in the last 72 hours. Thyroid function studies: No results for input(s): TSH, T4TOTAL, T3FREE, THYROIDAB in the last 72 hours.  Invalid input(s): FREET3 Anemia work up: No results for input(s): VITAMINB12, FOLATE, FERRITIN, TIBC, IRON, RETICCTPCT in the last 72 hours. Sepsis Labs: Recent Labs  Lab 08/25/18 1316 08/26/18 0432  WBC 5.6 4.7    Microbiology No results found for this or any previous visit (from the past 240 hour(s)).  Procedures and diagnostic studies:  No results found.  Medications:   . folic acid  1 mg Oral Daily  . furosemide  40 mg Oral BID  . multivitamin with minerals  1 tablet Oral Daily  . pantoprazole  40 mg Oral BID  . senna-docusate  1 tablet Oral BID  . sodium chloride  1 g Oral  TID WC  . thiamine  100 mg Oral Daily   Or  . thiamine  100 mg Intravenous Daily   Continuous Infusions:   LOS: 2 days   Patterson Hollenbaugh A Cathan Gearin  [facility]   *Please refer to [email or web address], password [email or web address] to get updated schedule on who will round on this patient, as hospitalists switch teams weekly. If 7PM-7AM, please contact night-coverage at [email or web address], password [email or web address] for any overnight needs.

## 2018-08-29 LAB — BASIC METABOLIC PANEL
Anion gap: 15 (ref 5–15)
Anion gap: 12 (ref 5–15)
BUN: 21 mg/dL (ref 8–23)
BUN: 23 mg/dL (ref 8–23)
CO2: 20 mmol/L — ABNORMAL LOW (ref 22–32)
CO2: 23 mmol/L (ref 22–32)
Calcium: 9.4 mg/dL (ref 8.9–10.3)
Calcium: 9.4 mg/dL (ref 8.9–10.3)
Chloride: 83 mmol/L — ABNORMAL LOW (ref 98–111)
Chloride: 86 mmol/L — ABNORMAL LOW (ref 98–111)
Creatinine, Ser: 0.96 mg/dL (ref 0.61–1.24)
Creatinine, Ser: 0.89 mg/dL (ref 0.61–1.24)
GFR calc Af Amer: >60 mL/min (ref >60)
GFR calc Af Amer: >60 mL/min (ref >60)
GFR calc non Af Amer: >60 mL/min (ref >60)
GFR calc non Af Amer: >60 mL/min (ref >60)
Glucose, Bld: 134 mg/dL — ABNORMAL HIGH (ref 70–99)
Glucose, Bld: 115 mg/dL — ABNORMAL HIGH (ref 70–99)
Potassium: 4.7 mmol/L (ref 3.5–5.1)
Potassium: 4.6 mmol/L (ref 3.5–5.1)
Sodium: 118 mmol/L — CL (ref 135–145)
Sodium: 121 mmol/L — ABNORMAL LOW (ref 135–145)

## 2018-08-29 MED ORDER — HYDROCODONE-ACETAMINOPHEN 5-325 MG PO TABS
1.0000 | ORAL_TABLET | Freq: Four times a day (QID) | ORAL | Status: DC | PRN
  Administered 2018-08-29: 22:00:00 1 via ORAL
  Filled 2018-08-29: qty 1

## 2018-08-29 MED ORDER — ACETAMINOPHEN 325 MG PO TABS: 650.0000 mg | ORAL_TABLET | Freq: Four times a day (QID) | ORAL | Status: DC | PRN

## 2018-08-29 NOTE — Progress Notes (Signed)
Progress Note    [name]  BSJ:[id number] DOB: May 31, 1940  DOA: 08/25/2018 PCP: Shon Baton, MD    Brief Narrative:   Chief complaint: Low sodium  Medical records reviewed and are as summarized below:  AMIIR [name] is an 78 y.o. male with past medical history significant for alcoholic cirrhosis, atrial flutter/fibrillation, diabetes mellitus type 2, hyponatremia, and history of lung nodules; who presents with complaints of abnormal sodium level after his gastroenterologist had increased his Lasix and spironolactone dosages.  Assessment/Plan:   Principal Problem:   Hyponatremia Active Problems:   Essential hypertension   Atrial flutter (HCC)   Cirrhosis, alcoholic (HCC)   Lung nodule   Cirrhosis of liver with ascites (HCC)   Constipation  Acute on chronic hyponatremia -nephrology consulted:  propensity to hypervolemic hyponatremia. His urine osms are inappropriately high compared to serum osms. Urine Na is high but this is after isotonic saline administration. Clinically, pt is volume up. Would continue fluid restriction as you are doing, Lasix 40 PO BID restarted.   Alcoholic cirrhosishistory of recurrent ascites, portal gastropathy, duodenal/colonic AVM. Was needing monthly paracentsis. Most recent 07/23/18 with 1.2L removed. Lasix and spironolactone increased to try and decrease frequency of paracentesis. -Restart home Lasix per nephrology  Chronic atrial flutter/atrial fibrillation -Status post ablation -continue BB  Bilateral lung nodule -Briefly reviewed his outpatient work-up thus far, has had bronchoscopy which revealed mucous glands, cytology negative for malignancy.Currently planning for repeat imaging in about 3 months.  Constipation. Patient reports having small bowel movement after smog enema yesterday. -Continue home medication of Linzess    Family Communication/Anticipated D/C date and plan/Code Status   DVT prophylaxis: Lovenox ordered.  Code Status: Full Code.  Family Communication: No family present at bedside Disposition Plan: pending Na improvement   Medical Consultants:    Nephrology Dr. Hollie Salk     Subjective:  Only has 2 12 oz [place]/day  Objective:    Vitals:   08/28/18 0530 08/28/18 1356 08/28/18 2114 08/29/18 0606  BP: 118/63 104/61 120/76 106/79  Pulse: 60 62 62 (!) 57  Resp: 18 20 18 16   Temp: 98 F (36.7 C) (!) 97.4 F (36.3 C) (!) 97.5 F (36.4 C) 97.9 F (36.6 C)  TempSrc: Oral Oral    SpO2: 97% 99% 99% 99%  Weight:      Height:        Intake/Output Summary (Last 24 hours) at 08/29/2018 1142 Last data filed at 08/29/2018 1141 Gross per 24 hour  Intake 780 ml  Output 2675 ml  Net -1895 ml   Filed Weights   08/25/18 1309  Weight: 73.9 kg    Exam: In bed, chronically ill appearing rrr +JVD Distended but soft abdomen +LE edema A+Ox3   Data Reviewed:   I have personally reviewed following labs and imaging studies:  Labs: Labs show the following:   Basic Metabolic Panel: Recent Labs  Lab 08/26/18 1939 08/27/18 0225 08/27/18 1814 08/28/18 0409 08/29/18 0351  NA 117* 116* 120* 118* 118*  K 5.0 5.1 4.2 5.1 4.7  CL 88* 86* 89* 86* 83*  CO2 19* 20* 21* 20* 20*  GLUCOSE 182* 157* 147* 125* 134*  BUN 19 16 13 13 21   CREATININE 1.07 0.95 1.02 0.82 0.96  CALCIUM 8.5* 9.2 8.9 9.0 9.4   GFR Estimated Creatinine Clearance: 60 mL/min (by C-G formula based on SCr of 0.96 mg/dL). Liver Function Tests: Recent Labs  Lab 08/25/18 1316  AST 31  ALT 26  ALKPHOS 118  BILITOT 2.3*  PROT 8.1  ALBUMIN 3.8   No results for input(s): LIPASE, AMYLASE in the last 168 hours. No results for input(s): AMMONIA in the last 168 hours. Coagulation profile No results for input(s): INR, PROTIME in the last 168 hours.  CBC: Recent Labs  Lab 08/25/18 1316 08/26/18 0432  WBC 5.6 4.7  NEUTROABS 4.0  --   HGB 16.3 14.8  HCT 44.6 41.0  MCV 85.8 85.6  PLT 173 134*   Cardiac  Enzymes: No results for input(s): CKTOTAL, CKMB, CKMBINDEX, TROPONINI in the last 168 hours. BNP (last 3 results) No results for input(s): PROBNP in the last 8760 hours. CBG: No results for input(s): GLUCAP in the last 168 hours. D-Dimer: No results for input(s): DDIMER in the last 72 hours. Hgb A1c: No results for input(s): HGBA1C in the last 72 hours. Lipid Profile: No results for input(s): CHOL, HDL, LDLCALC, TRIG, CHOLHDL, LDLDIRECT in the last 72 hours. Thyroid function studies: Recent Labs    08/28/18 0817  TSH 1.317   Anemia work up: No results for input(s): VITAMINB12, FOLATE, FERRITIN, TIBC, IRON, RETICCTPCT in the last 72 hours. Sepsis Labs: Recent Labs  Lab 08/25/18 1316 08/26/18 0432  WBC 5.6 4.7    Microbiology No results found for this or any previous visit (from the past 240 hour(s)).  Procedures and diagnostic studies:  No results found.  Medications:   . enoxaparin (LOVENOX) injection  40 mg Subcutaneous Q24H  . folic acid  1 mg Oral Daily  . furosemide  40 mg Oral BID  . linaclotide  145 mcg Oral QAC breakfast  . multivitamin with minerals  1 tablet Oral Daily  . pantoprazole  40 mg Oral BID  . thiamine  100 mg Oral Daily   Or  . thiamine  100 mg Intravenous Daily   Continuous Infusions:   LOS: 3 days   Geradine Girt  [facility]   *Please refer to [email or web address], password [email or web address] to get updated schedule on who will round on this patient, as hospitalists switch teams weekly. If 7PM-7AM, please contact night-coverage at [email or web address], password [email or web address] for any overnight needs.

## 2018-08-29 NOTE — Progress Notes (Signed)
CRITICAL VALUE ALERT  Critical Value:  Sodium 118  Date & Time Notified:  08/29/2018 0449  Provider Notified: Alger Memos, NP  Orders Received/Actions taken: No new orders received.

## 2018-08-29 NOTE — Progress Notes (Signed)
  [facility] Progress Note    Assessment/ Plan:   1.  Hyponatremia: given clincial history of cirrhosis, pt has a propensity to hypervolemic hyponatremia.  His urine osms are inappropriately high compared to serum osms.  Urine Na is high but this is after isotonic saline administration.  Clinically, pt is volume up.  Would continue fluid restriction as you are doing, Lasix 40 PO BID restarted.  Salt tabs decreased yesterday as really pt is total body Na and water up d/t decreased effective arterial blood flow with history of cirrhosis.  Net effect of salt tabs is increasing total body Na further which is a large driver of thirst and leads to worsening volume overload. 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  Cortisol OK, adding TSH.   2.  EtOH Cirrhosis: followed by GI.  Prn paracentesis, last one 07/23/2018.   3.  Afib: metoprolol  4.  Dispo: pending improvement  Subjective:    Na at 118.  No complaints.     Objective:   BP 106/79 (BP Location: Right Arm)   Pulse (!) 57   Temp 97.9 F (36.6 C)   Resp 16   Ht 5\' 6"  (1.676 m)   Wt 73.9 kg   SpO2 99%   BMI 26.31 kg/m   Intake/Output Summary (Last 24 hours) at 08/29/2018 1001 Last data filed at 08/29/2018 0849 Gross per 24 hour  Intake 960 ml  Output 1975 ml  Net -1015 ml   Weight change:   Physical Exam: GEN chronically ill, NAD, sitting in bed HEENT + scleral icterus NECK + JVD with + HJR PULM normal WOB, clear bilaterally CV irregular, soft systolic murmur ABD + fluid wave EXT 1+ LE edema, unchanged NEURO AAO x 3     Imaging: No results found.  Labs: BMET Recent Labs  Lab 08/26/18 0432 08/26/18 0714 08/26/18 1939 08/27/18 0225 08/27/18 1814 08/28/18 0409 08/29/18 0351  NA 119* 117* 117* 116* 120* 118* 118*  K 4.3 4.4 5.0 5.1 4.2 5.1 4.7  CL 86* 86* 88* 86* 89* 86* 83*  CO2 21* 22 19* 20* 21* 20* 20*  GLUCOSE 111* 125* 182* 157* 147* 125* 134*  BUN 27* 24* 19 16 13 13 21    CREATININE 0.99 0.92 1.07 0.95 1.02 0.82 0.96  CALCIUM 9.0 8.8* 8.5* 9.2 8.9 9.0 9.4   CBC Recent Labs  Lab 08/25/18 1316 08/26/18 0432  WBC 5.6 4.7  NEUTROABS 4.0  --   HGB 16.3 14.8  HCT 44.6 41.0  MCV 85.8 85.6  PLT 173 134*    Medications:    . enoxaparin (LOVENOX) injection  40 mg Subcutaneous Q24H  . folic acid  1 mg Oral Daily  . furosemide  40 mg Oral BID  . linaclotide  145 mcg Oral QAC breakfast  . multivitamin with minerals  1 tablet Oral Daily  . pantoprazole  40 mg Oral BID  . thiamine  100 mg Oral Daily   Or  . thiamine  100 mg Intravenous Daily      Madelon Lips MD [facility] pgr [phone] 08/29/2018, 10:01 AM

## 2018-08-30 ENCOUNTER — Encounter (HOSPITAL_COMMUNITY): Payer: Self-pay | Admitting: General Practice

## 2018-08-30 LAB — SODIUM
Sodium: 120 mmol/L — ABNORMAL LOW (ref 135–145)
Sodium: 119 mmol/L — CL (ref 135–145)

## 2018-08-30 LAB — BASIC METABOLIC PANEL
Anion gap: 11 (ref 5–15)
BUN: 29 mg/dL — ABNORMAL HIGH (ref 8–23)
CO2: 24 mmol/L (ref 22–32)
Calcium: 9.4 mg/dL (ref 8.9–10.3)
Chloride: 85 mmol/L — ABNORMAL LOW (ref 98–111)
Creatinine, Ser: 1.17 mg/dL (ref 0.61–1.24)
GFR calc Af Amer: >60 mL/min (ref >60)
GFR calc non Af Amer: >60 mL/min (ref >60)
Glucose, Bld: 115 mg/dL — ABNORMAL HIGH (ref 70–99)
Potassium: 4.4 mmol/L (ref 3.5–5.1)
Sodium: 120 mmol/L — ABNORMAL LOW (ref 135–145)

## 2018-08-30 MED ORDER — METOPROLOL SUCCINATE ER 25 MG PO TB24: 25.0000 mg | ORAL_TABLET | Freq: Every evening | ORAL | Status: DC | PRN

## 2018-08-30 MED ORDER — BISACODYL 10 MG RE SUPP
10.0000 mg | Freq: Every day | RECTAL | Status: DC | PRN
  Administered 2018-08-30: 22:00:00 10 mg via RECTAL
  Filled 2018-08-30: qty 1

## 2018-08-30 MED ORDER — TOLVAPTAN 15 MG PO TABS
15.0000 mg | ORAL_TABLET | ORAL | Status: AC
  Administered 2018-08-30: 15 mg via ORAL
  Filled 2018-08-30: qty 1

## 2018-08-30 NOTE — Care Management Important Message (Signed)
Important Message  Patient Details  Name: [name] MRN: [id number] Date of Birth: [birth year]   Medicare Important Message Given:  Yes    Orbie Pyo 08/30/2018, 4:22 PM

## 2018-08-30 NOTE — Progress Notes (Signed)
Patient ID: [name], male   DOB: 1941/05/17, 78 y.o.   MRN: [id number] [facility] Progress Note   Assessment/ Plan:   1.  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  Continue fluid restriction, furosemide 40 mg twice daily and will give a single dose of tolvaptan today (may potentially be a candidate for outpatient UreNa).  Off sodium chloride supplement at this time. 2.  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  On furosemide/spironolactone to help decrease frequency of paracentesis. 3.  Atrial fibrillation: Rate controlled on beta-blocker status post ablation. 4.  Bilateral lung nodules: Cytology negative for malignancy, continue radiological surveillance.  Subjective:   Inquires about disposition/going home.   Objective:   BP 100/63 (BP Location: Left Arm)   Pulse (!) 57   Temp 98 F (36.7 C)   Resp 18   Ht 5\' 6"  (1.676 m)   Wt 76.2 kg   SpO2 99%   BMI 27.11 kg/m   Intake/Output Summary (Last 24 hours) at 08/30/2018 1132 Last data filed at 08/30/2018 0858 Gross per 24 hour  Intake 860 ml  Output 1155 ml  Net -295 ml   Weight change:   Physical Exam: Gen: Comfortably resting in bed, appears chronically ill, icteric CVS: Pulse irregular bradycardia, ESM over apex Resp: Diminished breath sounds over bases, no rales Abd: Soft, moderate distention with fluid wave Ext: 1+ lower extremity edema  Imaging: No results found.  Labs: BMET Recent Labs  Lab 08/26/18 1939 08/27/18 0225 08/27/18 1814 08/28/18 0409 08/29/18 0351 08/29/18 1614 08/30/18 0442  NA 117* 116* 120* 118* 118* 121* 120*  K 5.0 5.1 4.2 5.1 4.7 4.6 4.4  CL 88* 86* 89* 86* 83* 86* 85*  CO2 19* 20* 21* 20* 20* 23 24  GLUCOSE 182* 157* 147* 125* 134* 115* 115*  BUN 19 16 13 13 21 23  29*  CREATININE 1.07 0.95 1.02 0.82 0.96 0.89 1.17   CALCIUM 8.5* 9.2 8.9 9.0 9.4 9.4 9.4   CBC Recent Labs  Lab 08/25/18 1316 08/26/18 0432  WBC 5.6 4.7  NEUTROABS 4.0  --   HGB 16.3 14.8  HCT 44.6 41.0  MCV 85.8 85.6  PLT 173 134*   Medications:    . enoxaparin (LOVENOX) injection  40 mg Subcutaneous Q24H  . folic acid  1 mg Oral Daily  . furosemide  40 mg Oral BID  . linaclotide  145 mcg Oral QAC breakfast  . multivitamin with minerals  1 tablet Oral Daily  . pantoprazole  40 mg Oral BID  . thiamine  100 mg Oral Daily   Or  . thiamine  100 mg Intravenous Daily    Elmarie Shiley, MD 08/30/2018, 11:32 AM

## 2018-08-30 NOTE — Evaluation (Signed)
Physical Therapy Evaluation Patient Details Name: [name] MRN: [id number] DOB: 05-Sep-1940 Today's Date: 08/30/2018   History of Present Illness  Pt is a 78 y/o male admitted secondary to hyponatremia. PMH includes HTN, a flutter, DM, and alcoholic cirrhosis.   Clinical Impression  Pt admitted secondary to problem above with deficits below. Pt unsteady without use of AD and required min A. Improved steadiness noted with use of RW. Educated about need for RW at home to improve safety. Will continue to follow acutely to maximize functional mobility independence and safety.      Follow Up Recommendations Home health PT;Supervision for mobility/OOB    Equipment Recommendations  Rolling walker with 5" wheels    Recommendations for Other Services       Precautions / Restrictions Precautions Precautions: Fall Restrictions Weight Bearing Restrictions: No      Mobility  Bed Mobility Overal bed mobility: Modified Independent                Transfers Overall transfer level: Needs assistance Equipment used: None;Rolling walker (2 wheeled) Transfers: Sit to/from Stand Sit to Stand: Min assist;Min guard         General transfer comment: Pt requiring min A for steadying assist to stand without AD. With use of RW, pt with increased steadiness and requiring min guard.   Ambulation/Gait Ambulation/Gait assistance: Min assist;Min guard Gait Distance (Feet): 150 Feet Assistive device: None;Rolling walker (2 wheeled) Gait Pattern/deviations: Step-through pattern;Decreased stride length Gait velocity: Decreased    General Gait Details: Unsteady gait without AD and pt reaching out for surfaces to hold onto. Pt with improved steadiness with use of RW. Educated about need for RW to improve safety at home.   Stairs            Wheelchair Mobility    Modified [name] (Stroke Patients Only)       Balance Overall balance assessment: Needs assistance Sitting-balance  support: No upper extremity supported;Feet supported Sitting balance-Leahy Scale: Good     Standing balance support: Bilateral upper extremity supported;During functional activity Standing balance-Leahy Scale: Poor Standing balance comment: Reliant on BUE support                              Pertinent Vitals/Pain Pain Assessment: No/denies pain    Home Living Family/patient expects to be discharged to:: Private residence Living Arrangements: Alone Available Help at Discharge: Family;Available PRN/intermittently Type of Home: House Home Access: Stairs to enter Entrance Stairs-Rails: Psychiatric nurse of Steps: 3 Home Layout: One level Home Equipment: None      Prior Function Level of Independence: Independent               Hand Dominance        Extremity/Trunk Assessment   Upper Extremity Assessment Upper Extremity Assessment: Generalized weakness    Lower Extremity Assessment Lower Extremity Assessment: Generalized weakness    Cervical / Trunk Assessment Cervical / Trunk Assessment: Normal  Communication   Communication: HOH  Cognition Arousal/Alertness: Awake/alert Behavior During Therapy: WFL for tasks assessed/performed Overall Cognitive Status: No family/caregiver present to determine baseline cognitive functioning                                 General Comments: Pt unaware of deficits and presenting with some safety awareness deficits.       General Comments  Exercises     Assessment/Plan    PT Assessment Patient needs continued PT services  PT Problem List Decreased strength;Decreased balance;Decreased mobility;Decreased knowledge of use of DME;Decreased knowledge of precautions;Decreased safety awareness       PT Treatment Interventions DME instruction;Gait training;Functional mobility training;Stair training;Therapeutic activities;Balance training;Therapeutic exercise;Patient/family  education    PT Goals (Current goals can be found in the Care Plan section)  Acute Rehab PT Goals Patient Stated Goal: to go home PT Goal Formulation: With patient Time For Goal Achievement: 09/13/18 Potential to Achieve Goals: Good    Frequency Min 3X/week   Barriers to discharge        Co-evaluation               AM-PAC PT "6 Clicks" Mobility  Outcome Measure Help needed turning from your back to your side while in a flat bed without using bedrails?: None Help needed moving from lying on your back to sitting on the side of a flat bed without using bedrails?: None Help needed moving to and from a bed to a chair (including a wheelchair)?: A Little Help needed standing up from a chair using your arms (e.g., wheelchair or bedside chair)?: A Little Help needed to walk in hospital room?: A Little Help needed climbing 3-5 steps with a railing? : A Lot 6 Click Score: 19    End of Session Equipment Utilized During Treatment: Gait belt Activity Tolerance: Patient tolerated treatment well Patient left: with call bell/phone within reach;in bed Nurse Communication: Mobility status PT Visit Diagnosis: Muscle weakness (generalized) (M62.81);Unsteadiness on feet (R26.81)    Time: 7903-8333 PT Time Calculation (min) (ACUTE ONLY): 20 min   Charges:   PT Evaluation $PT Eval Low Complexity: [place], PT, DPT  Acute Rehabilitation Services  Pager: [phone] Office: [phone]   Rudean Hitt 08/30/2018, 5:11 PM

## 2018-08-30 NOTE — Progress Notes (Signed)
[facility] PROGRESS NOTE  [name] TWS:[id number] DOB: May 05, 1941 DOA: 08/25/2018 PCP: Shon Baton, MD  Assessment/Plan: Acute on chronic hyponatremia. Sodium 120 today. Volume status -763ml. Weight 167. 99 -continue lasix -continue fluid restriction -daily weight -nephrology consulted:  propensity to hypervolemic hyponatremia. His urine osms are inappropriately high compared to serum osms. Urine Na is high but this is after isotonic saline administration. Clinically, pt is volume up. Would continue fluid restriction as you are doing, Lasix40 PO BID restarted.  Alcoholic cirrhosishistory of recurrent ascites, portal gastropathy, duodenal/colonic AVM. Was needing monthly paracentsis. Most recent 07/23/18 with 1.2L removed. Lasix and spironolactone increased to try and decrease frequency of paracentesis. -Restart home Lasix per nephrology  Chronic atrial flutter/atrial fibrillation. Rate controlled -Status post ablation -continue BB  Bilateral lung nodule -Briefly reviewed his outpatient work-up thus far, has had bronchoscopy which revealed mucous glands, cytology negative for malignancy.Currently planning for repeat imaging in about 3 months.  Constipation. Patient reports having small bowel movement after smog enema yesterday. -Continue home medication of Linzess    Code Status: full Family Communication: son by phone Disposition Plan: hopefully home tomorrow. Can stay with son for few days if needed. Son lives 1 mile from patient   Consultants:  [facility]  Procedures:    Antibiotics:    HPI/Subjective: [name] is an 78 y.o. male with past medical history significant for alcoholic cirrhosis, atrial flutter/fibrillation, diabetes mellitus type 2, hyponatremia, and history of lung nodules; who presented 4/8 with complaints of abnormal sodium level after his gastroenterologist had increased his Lasix and spironolactone dosages.   Objective: Vitals:    08/30/18 0451 08/30/18 0543  BP: (!) 86/70 100/63  Pulse: (!) 57   Resp:    Temp:    SpO2:      Intake/Output Summary (Last 24 hours) at 08/30/2018 0914 Last data filed at 08/30/2018 0858 Gross per 24 hour  Intake 1040 ml  Output 1705 ml  Net -665 ml   Filed Weights   08/25/18 1309 08/30/18 0704  Weight: 73.9 kg 76.2 kg    Exam:   General:  Sitting on side of bed eating breakfast. Denies pain/discomfort  Cardiovascular: rrr no mgr trace le edema  Respiratory: normal effort BS clear but distand  Abdomen: distended but soft +BS non tender to palpation  Musculoskeletal: joints without swelling/erythema   Neuro: alert and oriented x3 speech clear very HOH   Data Reviewed: Basic Metabolic Panel: Recent Labs  Lab 08/27/18 1814 08/28/18 0409 08/29/18 0351 08/29/18 1614 08/30/18 0442  NA 120* 118* 118* 121* 120*  K 4.2 5.1 4.7 4.6 4.4  CL 89* 86* 83* 86* 85*  CO2 21* 20* 20* 23 24  GLUCOSE 147* 125* 134* 115* 115*  BUN 13 13 21 23  29*  CREATININE 1.02 0.82 0.96 0.89 1.17  CALCIUM 8.9 9.0 9.4 9.4 9.4   Liver Function Tests: Recent Labs  Lab 08/25/18 1316  AST 31  ALT 26  ALKPHOS 118  BILITOT 2.3*  PROT 8.1  ALBUMIN 3.8   No results for input(s): LIPASE, AMYLASE in the last 168 hours. No results for input(s): AMMONIA in the last 168 hours. CBC: Recent Labs  Lab 08/25/18 1316 08/26/18 0432  WBC 5.6 4.7  NEUTROABS 4.0  --   HGB 16.3 14.8  HCT 44.6 41.0  MCV 85.8 85.6  PLT 173 134*   Cardiac Enzymes: No results for input(s): CKTOTAL, CKMB, CKMBINDEX, TROPONINI in the last 168 hours. BNP (last 3 results) Recent Labs  08/25/18 1316  BNP 586.3*    ProBNP (last 3 results) No results for input(s): PROBNP in the last 8760 hours.  CBG: No results for input(s): GLUCAP in the last 168 hours.  No results found for this or any previous visit (from the past 240 hour(s)).   Studies: No results found.  Scheduled Meds: . enoxaparin (LOVENOX)  injection  40 mg Subcutaneous Q24H  . folic acid  1 mg Oral Daily  . furosemide  40 mg Oral BID  . linaclotide  145 mcg Oral QAC breakfast  . multivitamin with minerals  1 tablet Oral Daily  . pantoprazole  40 mg Oral BID  . thiamine  100 mg Oral Daily   Or  . thiamine  100 mg Intravenous Daily   Continuous Infusions:  Principal Problem:   Hyponatremia Active Problems:   Cirrhosis, alcoholic (HCC)   Cirrhosis of liver with ascites (HCC)   Essential hypertension   Atrial flutter (HCC)   Lung nodule   Constipation    Time spent: 41 minutes    [place] NP [facility]  If 7PM-7AM, please contact night-coverage at [email or web address], password [facility] 08/30/2018, 9:14 AM  LOS: 4 days

## 2018-08-30 NOTE — Progress Notes (Addendum)
CRITICAL VALUE ALERT  Critical Value:  Sodium 119  Date & Time Notied:08/30/2018, 2108  Provider Notified: [facility] Text Page  Orders Received/Actions taken: No orders received at this time.  [facility] told RN to notify nephrology. Nephrology on call paged. Waiting for orders/call back.  08/30/2018 2228 [place], MD stated to recheck sodium in the morning. No other orders at this time.

## 2018-08-31 LAB — RENAL FUNCTION PANEL
Albumin: 3.7 g/dL (ref 3.5–5.0)
Anion gap: 13 (ref 5–15)
BUN: 30 mg/dL — ABNORMAL HIGH (ref 8–23)
CO2: 26 mmol/L (ref 22–32)
Calcium: 9.9 mg/dL (ref 8.9–10.3)
Chloride: 85 mmol/L — ABNORMAL LOW (ref 98–111)
Creatinine, Ser: 1.27 mg/dL — ABNORMAL HIGH (ref 0.61–1.24)
GFR calc Af Amer: >60 mL/min (ref >60)
GFR calc non Af Amer: 55 mL/min — ABNORMAL LOW (ref >60)
Glucose, Bld: 136 mg/dL — ABNORMAL HIGH (ref 70–99)
Phosphorus: 4.2 mg/dL (ref 2.5–4.6)
Potassium: 4.6 mmol/L (ref 3.5–5.1)
Sodium: 124 mmol/L — ABNORMAL LOW (ref 135–145)

## 2018-08-31 MED ORDER — METOPROLOL SUCCINATE ER 25 MG PO TB24: 25.0000 mg | ORAL_TABLET | Freq: Every evening | ORAL | 1 refills | Status: DC | PRN

## 2018-08-31 MED ORDER — ADULT MULTIVITAMIN W/MINERALS CH: 1.0000 | ORAL_TABLET | Freq: Every day | ORAL | Status: AC

## 2018-08-31 MED ORDER — FUROSEMIDE 40 MG PO TABS: 40.0000 mg | ORAL_TABLET | Freq: Two times a day (BID) | ORAL | 1 refills | Status: DC

## 2018-08-31 MED ORDER — THIAMINE HCL 100 MG PO TABS: 100.0000 mg | ORAL_TABLET | Freq: Every day | ORAL | 1 refills | Status: AC

## 2018-08-31 MED ORDER — SPIRONOLACTONE 100 MG PO TABS: 100.0000 mg | ORAL_TABLET | Freq: Every day | ORAL | 11 refills | Status: DC

## 2018-08-31 MED ORDER — FOLIC ACID 1 MG PO TABS: 1.0000 mg | ORAL_TABLET | Freq: Every day | ORAL | 1 refills | Status: AC

## 2018-08-31 NOTE — Progress Notes (Signed)
Patient ID: [name], male   DOB: Oct 15, 1940, 78 y.o.   MRN: [id number] [facility] Progress Note   Assessment/ Plan:   1.  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 Reminded on the need for chronic fluid restriction and furosemide 40 mg twice daily. He had samsca overnight with rise of sodium to 124. He appears to be around his baseline and can be DC today to follow up with Dr.Russo as an out-patient. Would recommend considering UreNa supplement for chronic hyponatremia. 2.  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  On furosemide and spironolactone to help decrease frequency of paracentesis. 3.  Atrial fibrillation: Rate controlled on beta-blocker status post ablation. 4.  Bilateral lung nodules: Cytology negative for malignancy, continue radiological surveillance.  Subjective:   Denies any acute events overnight, comfortable and excited about Na 124.   Objective:   BP (!) 144/91 (BP Location: Right Arm)   Pulse 75   Temp (!) 97.5 F (36.4 C)   Resp 20   Ht 5\' 6"  (1.676 m)   Wt 74.4 kg   SpO2 100%   BMI 26.47 kg/m   Intake/Output Summary (Last 24 hours) at 08/31/2018 1013 Last data filed at 08/31/2018 0926 Gross per 24 hour  Intake 780 ml  Output 2050 ml  Net -1270 ml   Weight change:   Physical Exam: Gen: Comfortably resting in bed CVS: Pulse irregularly irregular, normal rate, ESM over apex Resp: Diminished breath sounds over bases, no rales Abd: Soft, moderate distention with fluid wave Ext: Trace lower extremity edema  Imaging: No results found.  Labs: BMET Recent Labs  Lab 08/27/18 0225 08/27/18 1814 08/28/18 0409 08/29/18 0351 08/29/18 1614 08/30/18 0442 08/30/18 1309 08/30/18 2021 08/31/18 0352  NA 116* 120* 118* 118* 121* 120* 120* 119* 124*  K 5.1 4.2 5.1 4.7 4.6 4.4  --   --  4.6  CL 86*  89* 86* 83* 86* 85*  --   --  85*  CO2 20* 21* 20* 20* 23 24  --   --  26  GLUCOSE 157* 147* 125* 134* 115* 115*  --   --  136*  BUN 16 13 13 21 23  29*  --   --  30*  CREATININE 0.95 1.02 0.82 0.96 0.89 1.17  --   --  1.27*  CALCIUM 9.2 8.9 9.0 9.4 9.4 9.4  --   --  9.9  PHOS  --   --   --   --   --   --   --   --  4.2   CBC Recent Labs  Lab 08/25/18 1316 08/26/18 0432  WBC 5.6 4.7  NEUTROABS 4.0  --   HGB 16.3 14.8  HCT 44.6 41.0  MCV 85.8 85.6  PLT 173 134*   Medications:    . enoxaparin (LOVENOX) injection  40 mg Subcutaneous Q24H  . folic acid  1 mg Oral Daily  . furosemide  40 mg Oral BID  . linaclotide  145 mcg Oral QAC breakfast  . multivitamin with minerals  1 tablet Oral Daily  . pantoprazole  40 mg Oral BID  . thiamine  100 mg Oral Daily   Or  . thiamine  100 mg Intravenous Daily    Elmarie Shiley, MD 08/31/2018, 10:13 AM

## 2018-08-31 NOTE — Discharge Summary (Signed)
Physician Discharge Summary  [name]:[id number] DOB: 02-28-1941 DOA: 08/25/2018  PCP: Shon Baton, MD  Admit date: 08/25/2018 Discharge date: 08/31/2018  Time spent: 45 minutes  Recommendations for Outpatient Follow-up:  1. Follow up with Dr Virgina Ball 1-2 weeks for evaluation of sodium level and fluid status. Consider UreNa supplement per nephrology 2. Low sodium diet 3. Fluid restriction 1249ml 4. Home health PT and rolling walker   Discharge Diagnoses:  Principal Problem:   Hyponatremia Active Problems:   Cirrhosis, alcoholic ([place])   Cirrhosis of liver with ascites ([place])   Essential hypertension   Atrial flutter (HCC)   Lung nodule   Constipation   Discharge Condition: stable  Diet recommendation: low sodium with 122ml fluid restriction  Filed Weights   08/25/18 1309 08/30/18 0704 08/31/18 0355  Weight: 73.9 kg 76.2 kg 74.4 kg    History of present illness:  [name] a 78 y.o.malehx of alcohol cirrhosis with refractory ascites requiring monthly paracentsis, afib not on blood thinners,presented 4/8 with hyponatremia.Chart review indicated patient getting monthly paracentesis and his GI doctor increased his lasix to 80 mg in the morning and 40 mg in the evening and doubled spironolactone in response to patient complaints of needing paracentesis so frequently. Patient developed diffuse weakness.He also noticed that he had been urinating very frequently and felt thirsty all the time. He also had some headaches as well. Of note, patient did have a recent lung biopsy that did not show small cell lung cancer. He also had a PET scan that was unremarkable as well. Patient stated that he no longer drinks alcohol. He denied any sick contacts or fevers or vomiting. Patient went to see PCP and his sodium is low so sent for evaluation.  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 Sodium 124 at discharge. Likely related to volume overload. 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 Provided one dose Samsca with improvement. Also recommending consideration of UreNA supplement for chronic hyponatremia. Will continue lasix 40 bid and spironolactone at 100. Recommending fluid restriction as well.   Alcoholic cirrhosishistory of recurrent ascites, portal gastropathy, duodenal/colonic AVM. Was needing monthly paracentsis. Most recent 07/23/18 with 1.2L removed. Lasix and spironolactone increased to try and decrease frequency of paracentesis. Monitor for need of paracentesis   Chronic atrial flutter/atrial fibrillation. Rate controlled.  Bilateral lung nodule -Briefly reviewed his outpatient work-up thus far, has had bronchoscopy which revealed mucous glands, cytology negative for malignancy.Currently planning for repeat imaging in about 3 months.  Constipation. Patient reported having small bowel movement after smog enema yesterday.Continue home medication of Linzess   Procedures:    Consultations:  Dr Posey Pronto nephrology  Discharge Exam: Vitals:   08/30/18 2132 08/31/18 0352  BP: 122/79 (!) 144/91  Pulse: (!) 56 75  Resp: 20   Temp: (!) 97.5 F (36.4 C)   SpO2: 99% 100%    General: awake alert oriented x3 no acute distress Cardiovascular: rrr no mgr trace LE edema Respiratory: normal effort BS clear bilaterally no wheeze  Discharge Instructions   Discharge Instructions    Call MD for:  difficulty breathing, headache or visual disturbances   Complete by:  As directed    Call MD for:  persistant dizziness or light-headedness   Complete by:  As directed    Call MD for:  persistant nausea and vomiting   Complete by:  As directed    Diet - low sodium heart healthy   Complete  by:  As directed    Discharge instructions   Complete by:  As directed    Take medications as prescribed Follow up with Dr. Virgina Ball in 1-2 weeks for evaluation of sodium level and fluid  status Low sodium diet Fluid restriction 1254ml   Increase activity slowly   Complete by:  As directed      Allergies as of 08/31/2018      Reactions   Lisinopril Anaphylaxis, Other (See Comments)   Hyperkalemia, Dizziness   Lipitor [atorvastatin] Other (See Comments)   Marked leg fatigue   Sulfonamide Derivatives Other (See Comments)   UNSPECIFIED REACTION  [Unsure of reaction]      Medication List    TAKE these medications   folic acid 1 MG tablet Commonly known as:  FOLVITE Take 1 tablet (1 mg total) by mouth daily. Start taking on:  September 01, 2018   furosemide 40 MG tablet Commonly known as:  LASIX Take 1 tablet (40 mg total) by mouth 2 (two) times daily.   hydrALAZINE 25 MG tablet Commonly known as:  APRESOLINE Take 25 mg by mouth at bedtime.   Linzess 145 MCG Caps capsule Generic drug:  linaclotide Take 145 mcg by mouth daily as needed (for constipation).   metoprolol succinate 25 MG 24 hr tablet Commonly known as:  TOPROL-XL Take 1 tablet (25 mg total) by mouth at bedtime as needed (for HBP >130). What changed:    medication strength  how much to take  when to take this  reasons to take this   multivitamin with minerals Tabs tablet Take 1 tablet by mouth daily. Start taking on:  September 01, 2018   omeprazole 20 MG capsule Commonly known as:  PRILOSEC Take 1 capsule (20 mg total) by mouth 2 (two) times daily.   spironolactone 100 MG tablet Commonly known as:  Aldactone Take 1 tablet (100 mg total) by mouth daily. What changed:    how much to take  how to take this  when to take this  additional instructions   thiamine 100 MG tablet Take 1 tablet (100 mg total) by mouth daily. Start taking on:  September 01, 2018   Visine 0.05 % ophthalmic solution Generic drug:  tetrahydrozoline Place 2 drops into both eyes daily as needed (for dry eyes).            Durable Medical Equipment  (From admission, onward)         Start     Ordered    08/31/18 0711  For home use only DME Walker rolling  Once    Comments:  5 inch wheels  Question:  Patient needs a walker to treat with the following condition  Answer:  Hyponatremia   08/31/18 0710         Allergies  Allergen Reactions  . Lisinopril Anaphylaxis and Other (See Comments)    Hyperkalemia, Dizziness   . Lipitor [Atorvastatin] Other (See Comments)    Marked leg fatigue  . Sulfonamide Derivatives Other (See Comments)    UNSPECIFIED REACTION  [Unsure of reaction]      The results of significant diagnostics from this hospitalization (including imaging, microbiology, ancillary and laboratory) are listed below for reference.    Significant Diagnostic Studies: Ct Head Wo Contrast  Result Date: 08/25/2018 CLINICAL DATA:  Headache EXAM: CT HEAD WITHOUT CONTRAST TECHNIQUE: Contiguous axial images were obtained from the base of the skull through the vertex without intravenous contrast. COMPARISON:  None. FINDINGS: Brain: Mild age related volume  loss. No acute intracranial abnormality. Specifically, no hemorrhage, hydrocephalus, mass lesion, acute infarction, or significant intracranial injury. Vascular: No hyperdense vessel or unexpected calcification. Skull: No acute calvarial abnormality. Sinuses/Orbits: Visualized paranasal sinuses and mastoids clear. Orbital soft tissues unremarkable. Other: None IMPRESSION: No acute intracranial abnormality. Electronically Signed   By: Rolm Baptise M.D.   On: 08/25/2018 14:24   Dg Chest Port 1 View  Result Date: 08/25/2018 CLINICAL DATA:  Low sodium.  No chest complaints. EXAM: PORTABLE CHEST 1 VIEW COMPARISON:  07/26/2018 FINDINGS: Cardiac silhouette is normal in size. No mediastinal or hilar masses. No evidence of adenopathy. Lungs are hyperexpanded. Prominent bronchovascular markings are noted most evident in the lower lungs, stable. Mild linear scarring noted at the right lung base. No evidence of pneumonia or pulmonary edema. No pleural  effusion or pneumothorax. Skeletal structures are demineralized but grossly intact. IMPRESSION: 1. No acute cardiopulmonary disease. Electronically Signed   By: Lajean Manes M.D.   On: 08/25/2018 15:36    Microbiology: No results found for this or any previous visit (from the past 240 hour(s)).   Labs: Basic Metabolic Panel: Recent Labs  Lab 08/28/18 0409 08/29/18 0351 08/29/18 1614 08/30/18 0442 08/30/18 1309 08/30/18 2021 08/31/18 0352  NA 118* 118* 121* 120* 120* 119* 124*  K 5.1 4.7 4.6 4.4  --   --  4.6  CL 86* 83* 86* 85*  --   --  85*  CO2 20* 20* 23 24  --   --  26  GLUCOSE 125* 134* 115* 115*  --   --  136*  BUN 13 21 23  29*  --   --  30*  CREATININE 0.82 0.96 0.89 1.17  --   --  1.27*  CALCIUM 9.0 9.4 9.4 9.4  --   --  9.9  PHOS  --   --   --   --   --   --  4.2   Liver Function Tests: Recent Labs  Lab 08/25/18 1316 08/31/18 0352  AST 31  --   ALT 26  --   ALKPHOS 118  --   BILITOT 2.3*  --   PROT 8.1  --   ALBUMIN 3.8 3.7   No results for input(s): LIPASE, AMYLASE in the last 168 hours. No results for input(s): AMMONIA in the last 168 hours. CBC: Recent Labs  Lab 08/25/18 1316 08/26/18 0432  WBC 5.6 4.7  NEUTROABS 4.0  --   HGB 16.3 14.8  HCT 44.6 41.0  MCV 85.8 85.6  PLT 173 134*   Cardiac Enzymes: No results for input(s): CKTOTAL, CKMB, CKMBINDEX, TROPONINI in the last 168 hours. BNP: BNP (last 3 results) Recent Labs    08/25/18 1316  BNP 586.3*    ProBNP (last 3 results) No results for input(s): PROBNP in the last 8760 hours.  CBG: No results for input(s): GLUCAP in the last 168 hours.     SignedRadene Gunning NP [facility] 08/31/2018, 11:16 AM

## 2018-08-31 NOTE — TOC Transition Note (Signed)
Transition of Care [facility]) - CM/SW Discharge Note   Patient Details  Name: [name] MRN: [id number] Date of Birth: 22-Nov-1940  [facility] CM/SW Contact:  Sharin Mons, RN Phone Number: 08/31/2018, 12:31 PM   Clinical Narrative:    Admitted with Hyponatremia. Transition to home with home health services to follow. Pt declined walker. Pt states has walker @ home. Pt with transportation to home.  Pt's cell # [phone]  Final next level of care: [place] Barriers to Discharge: Barriers Resolved   Patient Goals and CMS Choice Patient states their goals for this hospitalization and ongoing recovery are:: to go home CMS Medicare.gov Compare Post Acute Care list provided to:: Patient Choice offered to / list presented to : Patient  Discharge Placement                       Discharge Plan and Services In-house Referral: NA Discharge Planning Services: CM Consult Post Acute Care Choice: Home Health          DME Arranged: N/A DME Agency: NA HH Arranged: PT [place] Agency: [place] ([facility])   Social Determinants of Health (SDOH) Interventions     Readmission Risk Interventions No flowsheet data found.

## 2018-09-01 DIAGNOSIS — E871 Hypo-osmolality and hyponatremia: Secondary | ICD-10-CM | POA: Diagnosis not present

## 2018-09-01 DIAGNOSIS — N189 Chronic kidney disease, unspecified: Secondary | ICD-10-CM | POA: Diagnosis not present

## 2018-09-01 DIAGNOSIS — F1020 Alcohol dependence, uncomplicated: Secondary | ICD-10-CM | POA: Diagnosis not present

## 2018-09-01 DIAGNOSIS — I482 Chronic atrial fibrillation, unspecified: Secondary | ICD-10-CM | POA: Diagnosis not present

## 2018-09-01 DIAGNOSIS — E1122 Type 2 diabetes mellitus with diabetic chronic kidney disease: Secondary | ICD-10-CM | POA: Diagnosis not present

## 2018-09-01 DIAGNOSIS — K7031 Alcoholic cirrhosis of liver with ascites: Secondary | ICD-10-CM | POA: Diagnosis not present

## 2018-09-01 DIAGNOSIS — I129 Hypertensive chronic kidney disease with stage 1 through stage 4 chronic kidney disease, or unspecified chronic kidney disease: Secondary | ICD-10-CM | POA: Diagnosis not present

## 2018-09-07 DIAGNOSIS — I4892 Unspecified atrial flutter: Secondary | ICD-10-CM | POA: Diagnosis not present

## 2018-09-07 DIAGNOSIS — E871 Hypo-osmolality and hyponatremia: Secondary | ICD-10-CM | POA: Diagnosis not present

## 2018-09-07 DIAGNOSIS — K219 Gastro-esophageal reflux disease without esophagitis: Secondary | ICD-10-CM | POA: Diagnosis not present

## 2018-09-07 DIAGNOSIS — K7460 Unspecified cirrhosis of liver: Secondary | ICD-10-CM | POA: Diagnosis not present

## 2018-09-07 DIAGNOSIS — R918 Other nonspecific abnormal finding of lung field: Secondary | ICD-10-CM | POA: Diagnosis not present

## 2018-09-07 DIAGNOSIS — K5900 Constipation, unspecified: Secondary | ICD-10-CM | POA: Diagnosis not present

## 2018-09-07 LAB — ACID FAST CULTURE WITH REFLEXED SENSITIVITIES: ACID FAST CULTURE - AFSCU3: NEGATIVE

## 2018-09-07 LAB — ACID FAST CULTURE WITH REFLEXED SENSITIVITIES (MYCOBACTERIA)

## 2018-09-13 ENCOUNTER — Other Ambulatory Visit (HOSPITAL_COMMUNITY): Payer: Self-pay | Admitting: Internal Medicine

## 2018-09-13 ENCOUNTER — Other Ambulatory Visit: Payer: Self-pay | Admitting: Internal Medicine

## 2018-09-13 DIAGNOSIS — K7460 Unspecified cirrhosis of liver: Secondary | ICD-10-CM | POA: Diagnosis not present

## 2018-09-13 DIAGNOSIS — K7031 Alcoholic cirrhosis of liver with ascites: Secondary | ICD-10-CM

## 2018-09-14 ENCOUNTER — Other Ambulatory Visit: Payer: Self-pay

## 2018-09-14 ENCOUNTER — Ambulatory Visit (HOSPITAL_COMMUNITY)
Admission: RE | Admit: 2018-09-14 | Discharge: 2018-09-14 | Disposition: A | Discharge status: Home / Self Care | Payer: Medicare HMO | Source: Ambulatory Visit | Attending: Internal Medicine | Admitting: Internal Medicine

## 2018-09-14 ENCOUNTER — Other Ambulatory Visit (HOSPITAL_COMMUNITY): Payer: Self-pay | Admitting: Internal Medicine

## 2018-09-14 DIAGNOSIS — K7031 Alcoholic cirrhosis of liver with ascites: Secondary | ICD-10-CM | POA: Insufficient documentation

## 2018-09-14 DIAGNOSIS — R188 Other ascites: Secondary | ICD-10-CM | POA: Diagnosis not present

## 2018-09-14 NOTE — Progress Notes (Addendum)
IR requested by Dr. Virgina Jock for possible image-guided paracentesis.  Limited abdominal ultrasound revealed no fluid that could be safely accessed with procedure today. Images sent to Dr. Anselm Pancoast for review. Informed patient that procedure will not occur today. All questions answered and concerns addressed.  IR available in future if needed.  Bea Graff Louk, PA-C 09/14/2018, 10:03 AM

## 2018-09-21 DIAGNOSIS — L270 Generalized skin eruption due to drugs and medicaments taken internally: Secondary | ICD-10-CM | POA: Diagnosis not present

## 2018-09-21 DIAGNOSIS — E871 Hypo-osmolality and hyponatremia: Secondary | ICD-10-CM | POA: Diagnosis not present

## 2018-09-21 DIAGNOSIS — L299 Pruritus, unspecified: Secondary | ICD-10-CM | POA: Diagnosis not present

## 2018-09-21 DIAGNOSIS — J309 Allergic rhinitis, unspecified: Secondary | ICD-10-CM | POA: Diagnosis not present

## 2018-09-30 ENCOUNTER — Other Ambulatory Visit (HOSPITAL_COMMUNITY): Payer: Self-pay | Admitting: Internal Medicine

## 2018-09-30 DIAGNOSIS — R188 Other ascites: Secondary | ICD-10-CM

## 2018-10-04 ENCOUNTER — Telehealth: Payer: Self-pay | Admitting: Radiation Oncology

## 2018-10-04 NOTE — Telephone Encounter (Signed)
I received a call regarding the patient and confusion about his upcoming scans. I let the nurse with Dr. Keane Police office know the plan to follow up with him with his June CT chest to determine if he will need treatment.

## 2018-10-06 ENCOUNTER — Encounter (HOSPITAL_COMMUNITY): Payer: Self-pay | Admitting: Student

## 2018-10-06 ENCOUNTER — Ambulatory Visit (HOSPITAL_COMMUNITY)
Admission: RE | Admit: 2018-10-06 | Discharge: 2018-10-06 | Disposition: A | Discharge status: Home / Self Care | Payer: Medicare HMO | Source: Ambulatory Visit | Attending: Internal Medicine | Admitting: Internal Medicine

## 2018-10-06 ENCOUNTER — Other Ambulatory Visit: Payer: Self-pay

## 2018-10-06 DIAGNOSIS — K7031 Alcoholic cirrhosis of liver with ascites: Secondary | ICD-10-CM | POA: Diagnosis not present

## 2018-10-06 DIAGNOSIS — R188 Other ascites: Secondary | ICD-10-CM | POA: Diagnosis not present

## 2018-10-06 HISTORY — PX: IR PARACENTESIS: IMG2679

## 2018-10-06 MED ORDER — LIDOCAINE HCL (PF) 1 % IJ SOLN
INTRAMUSCULAR | Status: AC | PRN
  Administered 2018-10-06: 10 mL

## 2018-10-06 MED ORDER — LIDOCAINE HCL 1 % IJ SOLN
INTRAMUSCULAR | Status: AC
  Filled 2018-10-06: qty 20

## 2018-10-06 NOTE — Procedures (Signed)
PROCEDURE SUMMARY:  Successful image-guided paracentesis from the left lower abdomen.  Yielded 2.5 liters of clear gold fluid.  No immediate complications.  EBL = 0 mL. Patient tolerated well.   Specimen was not sent for labs.  Claris Pong Louk PA-C 10/06/2018 1:09 PM

## 2018-10-07 ENCOUNTER — Telehealth: Payer: Self-pay | Admitting: *Deleted

## 2018-10-07 NOTE — Telephone Encounter (Signed)
Called patient to inform of STAT labs on 10-19-18 @ 11:45 am @ [facility] and his CT on 10-19-18 - arrival time- 12:45 @ [facility], pt. to have water only -4 hrs. prior to test, lvm on patient's voice mail per patient request

## 2018-10-19 ENCOUNTER — Other Ambulatory Visit: Payer: Self-pay

## 2018-10-19 ENCOUNTER — Ambulatory Visit
Admission: RE | Admit: 2018-10-19 | Discharge: 2018-10-19 | Disposition: A | Discharge status: Home / Self Care | Payer: Medicare HMO | Source: Ambulatory Visit | Attending: Radiation Oncology | Admitting: Radiation Oncology

## 2018-10-19 ENCOUNTER — Ambulatory Visit (HOSPITAL_COMMUNITY)
Admission: RE | Admit: 2018-10-19 | Discharge: 2018-10-19 | Disposition: A | Discharge status: Home / Self Care | Payer: Medicare HMO | Source: Ambulatory Visit | Attending: Radiation Oncology | Admitting: Radiation Oncology

## 2018-10-19 DIAGNOSIS — R911 Solitary pulmonary nodule: Secondary | ICD-10-CM | POA: Diagnosis not present

## 2018-10-19 DIAGNOSIS — J918 Pleural effusion in other conditions classified elsewhere: Secondary | ICD-10-CM | POA: Diagnosis not present

## 2018-10-19 DIAGNOSIS — R188 Other ascites: Secondary | ICD-10-CM | POA: Diagnosis not present

## 2018-10-19 DIAGNOSIS — R918 Other nonspecific abnormal finding of lung field: Secondary | ICD-10-CM

## 2018-10-19 LAB — BUN & CREATININE (CHCC)
BUN: 15 mg/dL (ref 8–23)
Creatinine: 1.01 mg/dL (ref 0.61–1.24)
GFR, Est AFR Am: >60 mL/min (ref >60)
GFR, Estimated: >60 mL/min (ref >60)

## 2018-10-19 MED ORDER — IOHEXOL 300 MG/ML  SOLN
75.0000 mL | Freq: Once | INTRAMUSCULAR | Status: AC | PRN
  Administered 2018-10-19: 75 mL via INTRAVENOUS

## 2018-10-19 MED ORDER — SODIUM CHLORIDE (PF) 0.9 % IJ SOLN
INTRAMUSCULAR | Status: AC
  Filled 2018-10-19: qty 50

## 2018-10-20 ENCOUNTER — Telehealth: Payer: Self-pay | Admitting: Radiation Oncology

## 2018-10-20 DIAGNOSIS — R918 Other nonspecific abnormal finding of lung field: Secondary | ICD-10-CM

## 2018-10-20 NOTE — Telephone Encounter (Addendum)
I spoke with [name] and let him know his CT results. Dr. Lisbeth Renshaw recommends proceeding with repeat scan in 3 months time, and no radiation at this time. He plans to visit family out of state for about 2 months, so we will coordinate after he returns home.

## 2018-10-25 ENCOUNTER — Other Ambulatory Visit: Payer: Self-pay | Admitting: Internal Medicine

## 2018-10-25 ENCOUNTER — Other Ambulatory Visit (HOSPITAL_COMMUNITY): Payer: Self-pay | Admitting: Internal Medicine

## 2018-10-25 DIAGNOSIS — R188 Other ascites: Secondary | ICD-10-CM

## 2018-10-28 ENCOUNTER — Encounter (HOSPITAL_COMMUNITY): Payer: Self-pay | Admitting: Student

## 2018-10-28 ENCOUNTER — Ambulatory Visit (HOSPITAL_COMMUNITY)
Admission: RE | Admit: 2018-10-28 | Discharge: 2018-10-28 | Disposition: A | Discharge status: Home / Self Care | Payer: Medicare HMO | Source: Ambulatory Visit | Attending: Internal Medicine | Admitting: Internal Medicine

## 2018-10-28 ENCOUNTER — Other Ambulatory Visit: Payer: Self-pay

## 2018-10-28 DIAGNOSIS — R188 Other ascites: Secondary | ICD-10-CM

## 2018-10-28 HISTORY — PX: IR PARACENTESIS: IMG2679

## 2018-10-28 MED ORDER — LIDOCAINE HCL 1 % IJ SOLN
INTRAMUSCULAR | Status: AC
  Filled 2018-10-28: qty 20

## 2018-10-28 MED ORDER — LIDOCAINE HCL (PF) 1 % IJ SOLN
INTRAMUSCULAR | Status: DC | PRN
  Administered 2018-10-28: 10 mL

## 2018-10-28 NOTE — Procedures (Signed)
PROCEDURE SUMMARY:  Successful US guided paracentesis from right lateral abdomen.  Yielded 3.4 liters of yellow fluid.  No immediate complications.  Pt tolerated well.   Specimen was not sent for labs.  EBL < 43mL  Docia Barrier PA-C 10/28/2018 10:32 AM

## 2018-11-11 ENCOUNTER — Ambulatory Visit (HOSPITAL_COMMUNITY): Payer: Medicare HMO

## 2018-11-17 DIAGNOSIS — K7460 Unspecified cirrhosis of liver: Secondary | ICD-10-CM | POA: Diagnosis not present

## 2018-11-17 DIAGNOSIS — Z20828 Contact with and (suspected) exposure to other viral communicable diseases: Secondary | ICD-10-CM | POA: Diagnosis not present

## 2018-11-17 DIAGNOSIS — R188 Other ascites: Secondary | ICD-10-CM | POA: Diagnosis not present

## 2018-11-17 DIAGNOSIS — Z1159 Encounter for screening for other viral diseases: Secondary | ICD-10-CM | POA: Diagnosis not present

## 2018-11-22 DIAGNOSIS — Z20828 Contact with and (suspected) exposure to other viral communicable diseases: Secondary | ICD-10-CM | POA: Diagnosis not present

## 2018-11-25 DIAGNOSIS — K7460 Unspecified cirrhosis of liver: Secondary | ICD-10-CM | POA: Diagnosis not present

## 2018-11-25 DIAGNOSIS — R188 Other ascites: Secondary | ICD-10-CM | POA: Diagnosis not present

## 2018-12-10 ENCOUNTER — Ambulatory Visit (HOSPITAL_COMMUNITY): Payer: Medicare HMO

## 2018-12-16 ENCOUNTER — Telehealth: Payer: Self-pay

## 2018-12-16 NOTE — Telephone Encounter (Signed)
Left message for patient to call back  

## 2018-12-16 NOTE — Telephone Encounter (Signed)
-----   Message from Marlon Pel, RN sent at 06/17/2018  2:02 PM EST ----- Patient needs [place]- see results 06/17/18 stark

## 2018-12-17 NOTE — Telephone Encounter (Signed)
Patient called and left a voicemail that he is our of town for several weeks and will call me when he returns from his vacation in the [place].

## 2018-12-22 DIAGNOSIS — R188 Other ascites: Secondary | ICD-10-CM | POA: Diagnosis not present

## 2018-12-22 DIAGNOSIS — K7460 Unspecified cirrhosis of liver: Secondary | ICD-10-CM | POA: Diagnosis not present

## 2018-12-24 DIAGNOSIS — K7031 Alcoholic cirrhosis of liver with ascites: Secondary | ICD-10-CM | POA: Diagnosis not present

## 2018-12-24 DIAGNOSIS — Z79899 Other long term (current) drug therapy: Secondary | ICD-10-CM | POA: Diagnosis not present

## 2018-12-24 DIAGNOSIS — K219 Gastro-esophageal reflux disease without esophagitis: Secondary | ICD-10-CM | POA: Diagnosis not present

## 2019-01-04 DIAGNOSIS — I129 Hypertensive chronic kidney disease with stage 1 through stage 4 chronic kidney disease, or unspecified chronic kidney disease: Secondary | ICD-10-CM | POA: Diagnosis not present

## 2019-01-04 DIAGNOSIS — E1122 Type 2 diabetes mellitus with diabetic chronic kidney disease: Secondary | ICD-10-CM | POA: Diagnosis not present

## 2019-01-04 DIAGNOSIS — F1010 Alcohol abuse, uncomplicated: Secondary | ICD-10-CM | POA: Diagnosis not present

## 2019-01-04 DIAGNOSIS — J439 Emphysema, unspecified: Secondary | ICD-10-CM | POA: Diagnosis not present

## 2019-01-04 DIAGNOSIS — R188 Other ascites: Secondary | ICD-10-CM | POA: Diagnosis not present

## 2019-01-04 DIAGNOSIS — F3341 Major depressive disorder, recurrent, in partial remission: Secondary | ICD-10-CM | POA: Diagnosis not present

## 2019-01-04 DIAGNOSIS — R918 Other nonspecific abnormal finding of lung field: Secondary | ICD-10-CM | POA: Diagnosis not present

## 2019-01-04 DIAGNOSIS — N183 Chronic kidney disease, stage 3 (moderate): Secondary | ICD-10-CM | POA: Diagnosis not present

## 2019-01-04 DIAGNOSIS — K766 Portal hypertension: Secondary | ICD-10-CM | POA: Diagnosis not present

## 2019-01-04 DIAGNOSIS — E871 Hypo-osmolality and hyponatremia: Secondary | ICD-10-CM | POA: Diagnosis not present

## 2019-01-04 DIAGNOSIS — J432 Centrilobular emphysema: Secondary | ICD-10-CM | POA: Diagnosis not present

## 2019-01-06 ENCOUNTER — Telehealth: Payer: Self-pay | Admitting: *Deleted

## 2019-01-06 NOTE — Telephone Encounter (Signed)
CALLED PATIENT TO INFORM OF STAT LABS ON 01-20-19 @ 10:45 AM @ [facility] AND HIS CT ON 01-20-19 - ARRIVAL TIME- 11:45 AM @ [facility], PATIENT TO HAVE WATER ONLY 4 HRS. PRIOR TO TEST, LVM FOR A RETURN CALL

## 2019-01-10 ENCOUNTER — Other Ambulatory Visit (HOSPITAL_COMMUNITY): Payer: Self-pay | Admitting: Internal Medicine

## 2019-01-10 DIAGNOSIS — E1122 Type 2 diabetes mellitus with diabetic chronic kidney disease: Secondary | ICD-10-CM | POA: Diagnosis not present

## 2019-01-10 DIAGNOSIS — E559 Vitamin D deficiency, unspecified: Secondary | ICD-10-CM | POA: Diagnosis not present

## 2019-01-10 DIAGNOSIS — K7460 Unspecified cirrhosis of liver: Secondary | ICD-10-CM | POA: Diagnosis not present

## 2019-01-12 ENCOUNTER — Ambulatory Visit (HOSPITAL_COMMUNITY)
Admission: RE | Admit: 2019-01-12 | Discharge: 2019-01-12 | Disposition: A | Discharge status: Home / Self Care | Payer: Medicare HMO | Source: Ambulatory Visit | Attending: Internal Medicine | Admitting: Internal Medicine

## 2019-01-12 ENCOUNTER — Encounter (HOSPITAL_COMMUNITY): Payer: Self-pay | Admitting: Radiology

## 2019-01-12 ENCOUNTER — Other Ambulatory Visit: Payer: Self-pay

## 2019-01-12 DIAGNOSIS — R188 Other ascites: Secondary | ICD-10-CM | POA: Diagnosis not present

## 2019-01-12 DIAGNOSIS — K7460 Unspecified cirrhosis of liver: Secondary | ICD-10-CM | POA: Diagnosis not present

## 2019-01-12 HISTORY — PX: IR PARACENTESIS: IMG2679

## 2019-01-12 MED ORDER — LIDOCAINE HCL 1 % IJ SOLN
INTRAMUSCULAR | Status: AC | PRN
  Administered 2019-01-12: 10 mL

## 2019-01-12 MED ORDER — LIDOCAINE HCL 1 % IJ SOLN
INTRAMUSCULAR | Status: AC
  Filled 2019-01-12: qty 20

## 2019-01-12 NOTE — Procedures (Signed)
PROCEDURE SUMMARY:  Successful US guided paracentesis from.  Yielded 3.4 L of hazy yellow fluid.  No immediate complications.  Pt tolerated well.   Specimen was not sent for labs.  EBL < 80mL  Ascencion Dike PA-C 01/12/2019 10:49 AM

## 2019-01-18 DIAGNOSIS — E871 Hypo-osmolality and hyponatremia: Secondary | ICD-10-CM | POA: Diagnosis not present

## 2019-01-20 ENCOUNTER — Ambulatory Visit (HOSPITAL_COMMUNITY)
Admission: RE | Admit: 2019-01-20 | Discharge: 2019-01-20 | Disposition: A | Discharge status: Home / Self Care | Payer: Medicare HMO | Source: Ambulatory Visit | Attending: Radiation Oncology | Admitting: Radiation Oncology

## 2019-01-20 ENCOUNTER — Other Ambulatory Visit: Payer: Self-pay

## 2019-01-20 ENCOUNTER — Ambulatory Visit
Admission: RE | Admit: 2019-01-20 | Discharge: 2019-01-20 | Disposition: A | Discharge status: Home / Self Care | Payer: Medicare HMO | Source: Ambulatory Visit | Attending: Radiation Oncology | Admitting: Radiation Oncology

## 2019-01-20 DIAGNOSIS — R918 Other nonspecific abnormal finding of lung field: Secondary | ICD-10-CM | POA: Insufficient documentation

## 2019-01-20 DIAGNOSIS — J90 Pleural effusion, not elsewhere classified: Secondary | ICD-10-CM | POA: Diagnosis not present

## 2019-01-20 DIAGNOSIS — D689 Coagulation defect, unspecified: Secondary | ICD-10-CM | POA: Diagnosis not present

## 2019-01-20 LAB — BUN & CREATININE (CHCC)
BUN: 19 mg/dL (ref 8–23)
Creatinine: 0.86 mg/dL (ref 0.61–1.24)
GFR, Est AFR Am: >60 mL/min (ref >60)
GFR, Estimated: >60 mL/min (ref >60)

## 2019-01-20 MED ORDER — IOHEXOL 300 MG/ML  SOLN
75.0000 mL | Freq: Once | INTRAMUSCULAR | Status: AC | PRN
  Administered 2019-01-20: 12:00:00 75 mL via INTRAVENOUS

## 2019-01-20 MED ORDER — SODIUM CHLORIDE (PF) 0.9 % IJ SOLN
INTRAMUSCULAR | Status: AC
  Filled 2019-01-20: qty 50

## 2019-01-21 ENCOUNTER — Telehealth: Payer: Self-pay | Admitting: Radiation Oncology

## 2019-01-21 DIAGNOSIS — R918 Other nonspecific abnormal finding of lung field: Secondary | ICD-10-CM

## 2019-01-21 NOTE — Telephone Encounter (Signed)
I called the patient to let him know his CT scan results. We will follow the lung nodules in 6 months. He was counseled on speaking with his PCP Dr. Virgina Jock regarding the cardiac findings. I also copied Dr. Virgina Jock on the results of the scan. The patient assures me he is not having any chest pain, trouble breathing, changes in activity, or any symptoms of concern. We will follow this expectantly.

## 2019-01-26 ENCOUNTER — Ambulatory Visit: Payer: Self-pay | Admitting: Radiation Oncology

## 2019-01-27 ENCOUNTER — Other Ambulatory Visit (HOSPITAL_COMMUNITY): Payer: Self-pay | Admitting: Internal Medicine

## 2019-01-27 DIAGNOSIS — R188 Other ascites: Secondary | ICD-10-CM

## 2019-01-28 ENCOUNTER — Other Ambulatory Visit: Payer: Self-pay

## 2019-01-28 ENCOUNTER — Ambulatory Visit (HOSPITAL_COMMUNITY)
Admission: RE | Admit: 2019-01-28 | Discharge: 2019-01-28 | Disposition: A | Discharge status: Home / Self Care | Payer: Medicare HMO | Source: Ambulatory Visit | Attending: Internal Medicine | Admitting: Internal Medicine

## 2019-01-28 ENCOUNTER — Encounter (HOSPITAL_COMMUNITY): Payer: Self-pay | Admitting: Interventional Radiology

## 2019-01-28 DIAGNOSIS — K7031 Alcoholic cirrhosis of liver with ascites: Secondary | ICD-10-CM | POA: Diagnosis not present

## 2019-01-28 DIAGNOSIS — E871 Hypo-osmolality and hyponatremia: Secondary | ICD-10-CM | POA: Diagnosis not present

## 2019-01-28 DIAGNOSIS — R188 Other ascites: Secondary | ICD-10-CM

## 2019-01-28 HISTORY — PX: IR PARACENTESIS: IMG2679

## 2019-01-28 MED ORDER — LIDOCAINE HCL 1 % IJ SOLN
INTRAMUSCULAR | Status: AC
  Filled 2019-01-28: qty 20

## 2019-01-31 ENCOUNTER — Telehealth: Payer: Self-pay

## 2019-01-31 NOTE — Telephone Encounter (Signed)
Pt called and said that he has little pimples that have come up on the bottom of his right foot. He said that he scratched it last night and it bled and bled. He said that had his son not been there to help him get it stopped he may have bled to death. Not sure what is going on but thinks that he needs to have it looked at.   Called pt back and told him that he needed to call his PCP as this may not be a vascular issue. He said that he already called them and they told him that there is nothing that they can do for him.   Advised him that he has not been here in 3 years and would be considered a new patient and it would be a little while before we could get him in for an appt. Again advised him to call his PCP or if symptoms were concerning to go to ER for evaluation.   York Cerise, CMA

## 2019-02-01 DIAGNOSIS — E871 Hypo-osmolality and hyponatremia: Secondary | ICD-10-CM | POA: Diagnosis not present

## 2019-02-03 ENCOUNTER — Other Ambulatory Visit (HOSPITAL_COMMUNITY): Payer: Self-pay | Admitting: Internal Medicine

## 2019-02-03 DIAGNOSIS — E871 Hypo-osmolality and hyponatremia: Secondary | ICD-10-CM | POA: Diagnosis not present

## 2019-02-03 DIAGNOSIS — N183 Chronic kidney disease, stage 3 (moderate): Secondary | ICD-10-CM | POA: Diagnosis not present

## 2019-02-03 DIAGNOSIS — R911 Solitary pulmonary nodule: Secondary | ICD-10-CM | POA: Diagnosis not present

## 2019-02-03 DIAGNOSIS — K7460 Unspecified cirrhosis of liver: Secondary | ICD-10-CM

## 2019-02-03 DIAGNOSIS — R918 Other nonspecific abnormal finding of lung field: Secondary | ICD-10-CM | POA: Diagnosis not present

## 2019-02-03 DIAGNOSIS — R188 Other ascites: Secondary | ICD-10-CM

## 2019-02-03 DIAGNOSIS — I129 Hypertensive chronic kidney disease with stage 1 through stage 4 chronic kidney disease, or unspecified chronic kidney disease: Secondary | ICD-10-CM | POA: Diagnosis not present

## 2019-02-04 ENCOUNTER — Ambulatory Visit (HOSPITAL_COMMUNITY)
Admission: RE | Admit: 2019-02-04 | Discharge: 2019-02-04 | Disposition: A | Discharge status: Home / Self Care | Payer: Medicare HMO | Source: Ambulatory Visit | Attending: Internal Medicine | Admitting: Internal Medicine

## 2019-02-04 ENCOUNTER — Encounter (HOSPITAL_COMMUNITY): Payer: Self-pay | Admitting: Student

## 2019-02-04 ENCOUNTER — Other Ambulatory Visit: Payer: Self-pay

## 2019-02-04 DIAGNOSIS — K7460 Unspecified cirrhosis of liver: Secondary | ICD-10-CM | POA: Insufficient documentation

## 2019-02-04 DIAGNOSIS — R188 Other ascites: Secondary | ICD-10-CM | POA: Diagnosis not present

## 2019-02-04 HISTORY — PX: IR PARACENTESIS: IMG2679

## 2019-02-04 MED ORDER — LIDOCAINE HCL 1 % IJ SOLN
INTRAMUSCULAR | Status: AC | PRN
  Administered 2019-02-04: 10 mL

## 2019-02-04 MED ORDER — LIDOCAINE HCL 1 % IJ SOLN
INTRAMUSCULAR | Status: AC
  Filled 2019-02-04: qty 20

## 2019-02-04 NOTE — Procedures (Signed)
PROCEDURE SUMMARY:  Successful US guided paracentesis from left lateral abdomen.  Yielded 3.3 liters of yellow, cloudy fluid.  No immediate complications.  Pt tolerated well.   Specimen was not sent for labs. No albumin was ordered.  Patient does not meet protocol for albumin administration.   EBL < 56mL  Docia Barrier PA-C 02/04/2019 3:27 PM

## 2019-02-11 DIAGNOSIS — I129 Hypertensive chronic kidney disease with stage 1 through stage 4 chronic kidney disease, or unspecified chronic kidney disease: Secondary | ICD-10-CM | POA: Diagnosis not present

## 2019-02-14 ENCOUNTER — Other Ambulatory Visit: Payer: Self-pay | Admitting: Emergency Medicine

## 2019-02-14 ENCOUNTER — Other Ambulatory Visit: Payer: Self-pay | Admitting: Internal Medicine

## 2019-02-14 ENCOUNTER — Other Ambulatory Visit (HOSPITAL_COMMUNITY): Payer: Self-pay | Admitting: Internal Medicine

## 2019-02-14 DIAGNOSIS — K7031 Alcoholic cirrhosis of liver with ascites: Secondary | ICD-10-CM

## 2019-02-15 ENCOUNTER — Ambulatory Visit (HOSPITAL_COMMUNITY)
Admission: RE | Admit: 2019-02-15 | Discharge: 2019-02-15 | Disposition: A | Discharge status: Home / Self Care | Payer: Medicare HMO | Source: Ambulatory Visit | Attending: Internal Medicine | Admitting: Internal Medicine

## 2019-02-15 ENCOUNTER — Encounter (HOSPITAL_COMMUNITY): Payer: Self-pay | Admitting: Physician Assistant

## 2019-02-15 ENCOUNTER — Other Ambulatory Visit: Payer: Self-pay

## 2019-02-15 ENCOUNTER — Encounter (HOSPITAL_BASED_OUTPATIENT_CLINIC_OR_DEPARTMENT_OTHER): Payer: Medicare HMO

## 2019-02-15 DIAGNOSIS — K7031 Alcoholic cirrhosis of liver with ascites: Secondary | ICD-10-CM | POA: Diagnosis not present

## 2019-02-15 HISTORY — PX: IR PARACENTESIS: IMG2679

## 2019-02-15 MED ORDER — LIDOCAINE HCL 1 % IJ SOLN
INTRAMUSCULAR | Status: AC
  Filled 2019-02-15: qty 20

## 2019-02-15 MED ORDER — LIDOCAINE HCL (PF) 1 % IJ SOLN
INTRAMUSCULAR | Status: DC | PRN
  Administered 2019-02-15: 5 mL

## 2019-02-15 NOTE — Procedures (Signed)
PROCEDURE SUMMARY:  Successful image-guided paracentesis from the left lower abdomen.  Yielded 3.6 liters of hazy yellow fluid.  No immediate complications.  EBL: zero Patient tolerated well.   Specimen was not sent for labs. Patient did not receive post procedure albumin.  Please see imaging section of Epic for full dictation.  Joaquim Nam PA-C 02/15/2019 9:17 AM

## 2019-02-22 DIAGNOSIS — E1122 Type 2 diabetes mellitus with diabetic chronic kidney disease: Secondary | ICD-10-CM | POA: Diagnosis not present

## 2019-02-22 DIAGNOSIS — R188 Other ascites: Secondary | ICD-10-CM | POA: Diagnosis not present

## 2019-02-22 DIAGNOSIS — E871 Hypo-osmolality and hyponatremia: Secondary | ICD-10-CM | POA: Diagnosis not present

## 2019-02-22 DIAGNOSIS — I129 Hypertensive chronic kidney disease with stage 1 through stage 4 chronic kidney disease, or unspecified chronic kidney disease: Secondary | ICD-10-CM | POA: Diagnosis not present

## 2019-02-22 DIAGNOSIS — K766 Portal hypertension: Secondary | ICD-10-CM | POA: Diagnosis not present

## 2019-02-22 DIAGNOSIS — K7460 Unspecified cirrhosis of liver: Secondary | ICD-10-CM | POA: Diagnosis not present

## 2019-02-22 DIAGNOSIS — R918 Other nonspecific abnormal finding of lung field: Secondary | ICD-10-CM | POA: Diagnosis not present

## 2019-02-22 DIAGNOSIS — F1010 Alcohol abuse, uncomplicated: Secondary | ICD-10-CM | POA: Diagnosis not present

## 2019-02-22 DIAGNOSIS — N183 Chronic kidney disease, stage 3 unspecified: Secondary | ICD-10-CM | POA: Diagnosis not present

## 2019-02-25 ENCOUNTER — Other Ambulatory Visit: Payer: Self-pay | Admitting: Internal Medicine

## 2019-02-25 ENCOUNTER — Other Ambulatory Visit (HOSPITAL_COMMUNITY): Payer: Self-pay | Admitting: Internal Medicine

## 2019-02-25 DIAGNOSIS — K7031 Alcoholic cirrhosis of liver with ascites: Secondary | ICD-10-CM

## 2019-02-25 DIAGNOSIS — F1010 Alcohol abuse, uncomplicated: Secondary | ICD-10-CM

## 2019-02-25 DIAGNOSIS — R188 Other ascites: Secondary | ICD-10-CM

## 2019-02-26 ENCOUNTER — Other Ambulatory Visit: Payer: Self-pay | Admitting: Gastroenterology

## 2019-02-28 ENCOUNTER — Ambulatory Visit (HOSPITAL_COMMUNITY)
Admission: RE | Admit: 2019-02-28 | Discharge: 2019-02-28 | Disposition: A | Discharge status: Home / Self Care | Payer: Medicare HMO | Source: Ambulatory Visit | Attending: Internal Medicine | Admitting: Internal Medicine

## 2019-02-28 ENCOUNTER — Encounter (HOSPITAL_COMMUNITY): Payer: Self-pay | Admitting: Physician Assistant

## 2019-02-28 ENCOUNTER — Other Ambulatory Visit: Payer: Self-pay

## 2019-02-28 DIAGNOSIS — K7031 Alcoholic cirrhosis of liver with ascites: Secondary | ICD-10-CM | POA: Diagnosis not present

## 2019-02-28 DIAGNOSIS — R188 Other ascites: Secondary | ICD-10-CM

## 2019-02-28 DIAGNOSIS — F1010 Alcohol abuse, uncomplicated: Secondary | ICD-10-CM

## 2019-02-28 HISTORY — PX: IR PARACENTESIS: IMG2679

## 2019-02-28 MED ORDER — LIDOCAINE HCL 1 % IJ SOLN
INTRAMUSCULAR | Status: AC
  Filled 2019-02-28: qty 20

## 2019-02-28 MED ORDER — LIDOCAINE HCL (PF) 1 % IJ SOLN
INTRAMUSCULAR | Status: DC | PRN
  Administered 2019-02-28: 10 mL

## 2019-02-28 NOTE — Procedures (Signed)
PROCEDURE SUMMARY:  Successful image-guided paracentesis from the right lower abdomen.  Yielded 4.0 liters of clear yellow fluid.  No immediate complications.  EBL: zero Patient tolerated well.   Specimen was not sent for labs. Patient did not receive post procedure albumin per ordering provider.   Please see imaging section of Epic for full dictation.  Joaquim Nam PA-C 02/28/2019 9:19 AM

## 2019-03-08 ENCOUNTER — Inpatient Hospital Stay (HOSPITAL_COMMUNITY)
Admission: EM | Admit: 2019-03-08 | Discharge: 2019-03-11 | DRG: 432 | Disposition: A | Discharge status: Home / Self Care | Payer: Medicare HMO | Attending: Internal Medicine | Admitting: Internal Medicine

## 2019-03-08 ENCOUNTER — Other Ambulatory Visit: Payer: Self-pay | Admitting: Internal Medicine

## 2019-03-08 ENCOUNTER — Other Ambulatory Visit: Payer: Self-pay

## 2019-03-08 ENCOUNTER — Encounter (HOSPITAL_COMMUNITY): Payer: Self-pay

## 2019-03-08 ENCOUNTER — Other Ambulatory Visit (HOSPITAL_COMMUNITY): Payer: Self-pay | Admitting: Internal Medicine

## 2019-03-08 ENCOUNTER — Encounter (HOSPITAL_BASED_OUTPATIENT_CLINIC_OR_DEPARTMENT_OTHER): Payer: Medicare HMO

## 2019-03-08 ENCOUNTER — Emergency Department (HOSPITAL_COMMUNITY): Payer: Medicare HMO

## 2019-03-08 DIAGNOSIS — R188 Other ascites: Secondary | ICD-10-CM | POA: Diagnosis not present

## 2019-03-08 DIAGNOSIS — J9811 Atelectasis: Secondary | ICD-10-CM | POA: Diagnosis not present

## 2019-03-08 DIAGNOSIS — I10 Essential (primary) hypertension: Secondary | ICD-10-CM | POA: Diagnosis present

## 2019-03-08 DIAGNOSIS — F4310 Post-traumatic stress disorder, unspecified: Secondary | ICD-10-CM | POA: Diagnosis present

## 2019-03-08 DIAGNOSIS — E785 Hyperlipidemia, unspecified: Secondary | ICD-10-CM | POA: Diagnosis present

## 2019-03-08 DIAGNOSIS — K7030 Alcoholic cirrhosis of liver without ascites: Secondary | ICD-10-CM | POA: Diagnosis not present

## 2019-03-08 DIAGNOSIS — Z882 Allergy status to sulfonamides status: Secondary | ICD-10-CM | POA: Diagnosis not present

## 2019-03-08 DIAGNOSIS — F1010 Alcohol abuse, uncomplicated: Secondary | ICD-10-CM | POA: Diagnosis present

## 2019-03-08 DIAGNOSIS — I4891 Unspecified atrial fibrillation: Secondary | ICD-10-CM | POA: Diagnosis present

## 2019-03-08 DIAGNOSIS — E8770 Fluid overload, unspecified: Secondary | ICD-10-CM | POA: Diagnosis present

## 2019-03-08 DIAGNOSIS — Z888 Allergy status to other drugs, medicaments and biological substances status: Secondary | ICD-10-CM | POA: Diagnosis not present

## 2019-03-08 DIAGNOSIS — E871 Hypo-osmolality and hyponatremia: Secondary | ICD-10-CM | POA: Diagnosis present

## 2019-03-08 DIAGNOSIS — K219 Gastro-esophageal reflux disease without esophagitis: Secondary | ICD-10-CM | POA: Diagnosis present

## 2019-03-08 DIAGNOSIS — K7460 Unspecified cirrhosis of liver: Secondary | ICD-10-CM

## 2019-03-08 DIAGNOSIS — J9601 Acute respiratory failure with hypoxia: Secondary | ICD-10-CM | POA: Diagnosis present

## 2019-03-08 DIAGNOSIS — Z8249 Family history of ischemic heart disease and other diseases of the circulatory system: Secondary | ICD-10-CM

## 2019-03-08 DIAGNOSIS — Z8042 Family history of malignant neoplasm of prostate: Secondary | ICD-10-CM

## 2019-03-08 DIAGNOSIS — I4811 Longstanding persistent atrial fibrillation: Secondary | ICD-10-CM | POA: Diagnosis not present

## 2019-03-08 DIAGNOSIS — R846 Abnormal cytological findings in specimens from respiratory organs and thorax: Secondary | ICD-10-CM | POA: Diagnosis not present

## 2019-03-08 DIAGNOSIS — R0602 Shortness of breath: Secondary | ICD-10-CM | POA: Diagnosis not present

## 2019-03-08 DIAGNOSIS — J948 Other specified pleural conditions: Secondary | ICD-10-CM | POA: Diagnosis not present

## 2019-03-08 DIAGNOSIS — J90 Pleural effusion, not elsewhere classified: Secondary | ICD-10-CM | POA: Diagnosis not present

## 2019-03-08 DIAGNOSIS — Z823 Family history of stroke: Secondary | ICD-10-CM

## 2019-03-08 DIAGNOSIS — I311 Chronic constrictive pericarditis: Secondary | ICD-10-CM | POA: Diagnosis present

## 2019-03-08 DIAGNOSIS — K7031 Alcoholic cirrhosis of liver with ascites: Secondary | ICD-10-CM | POA: Diagnosis present

## 2019-03-08 DIAGNOSIS — I4892 Unspecified atrial flutter: Secondary | ICD-10-CM | POA: Diagnosis present

## 2019-03-08 DIAGNOSIS — K31819 Angiodysplasia of stomach and duodenum without bleeding: Secondary | ICD-10-CM | POA: Diagnosis present

## 2019-03-08 DIAGNOSIS — Z20828 Contact with and (suspected) exposure to other viral communicable diseases: Secondary | ICD-10-CM | POA: Diagnosis present

## 2019-03-08 DIAGNOSIS — Z8371 Family history of colonic polyps: Secondary | ICD-10-CM | POA: Diagnosis not present

## 2019-03-08 LAB — CBC
HCT: 39.7 % (ref 39.0–52.0)
Hemoglobin: 13.4 g/dL (ref 13.0–17.0)
MCH: 30.1 pg (ref 26.0–34.0)
MCHC: 33.8 g/dL (ref 30.0–36.0)
MCV: 89.2 fL (ref 80.0–100.0)
Platelets: 191 10*3/uL (ref 150–400)
RBC: 4.45 MIL/uL (ref 4.22–5.81)
RDW: 16.8 % — ABNORMAL HIGH (ref 11.5–15.5)
WBC: 6.6 10*3/uL (ref 4.0–10.5)
nRBC: 0 % (ref 0.0–0.2)

## 2019-03-08 LAB — COMPREHENSIVE METABOLIC PANEL
ALT: 15 U/L (ref 0–44)
AST: 25 U/L (ref 15–41)
Albumin: 3.3 g/dL — ABNORMAL LOW (ref 3.5–5.0)
Alkaline Phosphatase: 151 U/L — ABNORMAL HIGH (ref 38–126)
Anion gap: 12 (ref 5–15)
BUN: 12 mg/dL (ref 8–23)
CO2: 25 mmol/L (ref 22–32)
Calcium: 8.9 mg/dL (ref 8.9–10.3)
Chloride: 91 mmol/L — ABNORMAL LOW (ref 98–111)
Creatinine, Ser: 0.96 mg/dL (ref 0.61–1.24)
GFR calc Af Amer: >60 mL/min (ref >60)
GFR calc non Af Amer: >60 mL/min (ref >60)
Glucose, Bld: 120 mg/dL — ABNORMAL HIGH (ref 70–99)
Potassium: 3.6 mmol/L (ref 3.5–5.1)
Sodium: 128 mmol/L — ABNORMAL LOW (ref 135–145)
Total Bilirubin: 2.1 mg/dL — ABNORMAL HIGH (ref 0.3–1.2)
Total Protein: 7.6 g/dL (ref 6.5–8.1)

## 2019-03-08 LAB — LIPASE, BLOOD: Lipase: 34 U/L (ref 11–51)

## 2019-03-08 MED ORDER — LIDOCAINE HCL (PF) 1 % IJ SOLN
5.0000 mL | Freq: Once | INTRAMUSCULAR | Status: AC
  Administered 2019-03-08: 5 mL
  Filled 2019-03-08: qty 5

## 2019-03-08 NOTE — ED Triage Notes (Signed)
Pt reports SOB while watching tv this evening. Resp labored in triage. Hx of liver problems, paracentesis scheduled for tomorrow.

## 2019-03-08 NOTE — ED Provider Notes (Signed)
[facility] DEPARTMENT Provider Note   CSN: [id number] Arrival date & time: 03/08/19  1951     History   Chief Complaint Chief Complaint  Patient presents with  . Shortness of Breath    HPI [name] is a 78 y.o. male.     The history is provided by the patient.  Shortness of Breath Severity:  Mild Onset quality:  Gradual Timing:  Constant Progression:  Unchanged Chronicity:  New Context comment:  Hx of cirrhosis, states supposed to have paracentesis tomorrow but states to uncomfortable to wait. No abdominal pain or fever.  Relieved by:  Nothing Worsened by:  Nothing Associated symptoms: no abdominal pain, no chest pain, no cough, no ear pain, no fever, no rash, no sore throat and no vomiting     Past Medical History:  Diagnosis Date  . Adenomatous polyps 06/2004  . Agent orange exposure   . Alcoholic cirrhosis of liver with ascites ([place])   . Alcoholism ([place])   . Allergy   . Anxiety    PTSD- no meds  . Atrial fibrillation ([place])   . Atrial flutter ([place])    had ablation   . Chronic kidney disease    "beginnings of kidney failure"- 3-4 years ago  . Depression   . Diabetes mellitus without complication (HCC)    no meds  . Diverticulosis   . GERD (gastroesophageal reflux disease)   . Hemorrhoids   . Hiatal hernia   . Hx of hernia repair   . Hyperlipidemia   . Hypertension   . Hyponatremia 08/2018  . Iron deficiency anemia   . Lung nodules    bilateral  . Pedal edema   . Pneumonia    06-2016  . PTSD (post-traumatic stress disorder)   . Substance abuse ([place])    alcohol use  . Ulcer   . Varicose veins   . Wears dentures   . Wears glasses   . Wears hearing aid    B/L    Patient Active Problem List   Diagnosis Date Noted  . Constipation 08/28/2018  . Cirrhosis of liver with ascites ([place]) 08/26/2018  . Leukopenia 07/16/2018  . Lung nodule 07/16/2018  . Coagulopathy ([place]) 07/15/2018  . Iron deficiency anemia 02/19/2016   . Microcytic anemia 02/18/2016  . GERD (gastroesophageal reflux disease) 02/18/2016  . Orthostatic hypotension 02/17/2016  . Syncope 02/17/2016  . Hyponatremia 02/17/2016  . Varicose veins of bilateral lower extremities with other complications 46/96/2952  . Cirrhosis, alcoholic ([place]) 84/13/2440  . Ascites 10/07/2012  . Varicose veins of lower extremities with other complications 03/15/2535  . HYPERLIPIDEMIA 11/01/2008  . ALCOHOLISM 11/01/2008  . Essential hypertension 11/01/2008  . ATRIAL FIBRILLATION 11/01/2008  . Atrial flutter ([place]) 11/01/2008  . HEMORRHOIDS 11/01/2008  . ISCHEMIA 11/01/2008  . HERNIA, UMBILICAL 64/40/3474  . DIVERTICULOSIS OF COLON 11/01/2008  . GERD 12/16/2007  . COLONIC POLYPS, ADENOMATOUS, HX OF 12/16/2007    Past Surgical History:  Procedure Laterality Date  . ATRIAL ABLATION SURGERY    . COLONOSCOPY    . FUDUCIAL PLACEMENT N/A 07/26/2018   Procedure: PLACEMENT OF FUDUCIAL;  Surgeon: [name], MD;  Location: [facility];  Service: Thoracic;  Laterality: N/A;  . IR PARACENTESIS  10/28/2016  . IR PARACENTESIS  11/12/2016  . IR PARACENTESIS  12/02/2016  . IR PARACENTESIS  12/10/2016  . IR PARACENTESIS  12/30/2016  . IR PARACENTESIS  01/27/2017  . IR PARACENTESIS  09/09/2017  . IR PARACENTESIS  03/04/2018  . IR PARACENTESIS  04/08/2018  . IR PARACENTESIS  05/10/2018  . IR PARACENTESIS  05/21/2018  . IR PARACENTESIS  06/16/2018  . IR PARACENTESIS  07/23/2018  . IR PARACENTESIS  10/06/2018  . IR PARACENTESIS  10/28/2018  . IR PARACENTESIS  01/12/2019  . IR PARACENTESIS  01/28/2019  . IR PARACENTESIS  02/04/2019  . IR PARACENTESIS  02/15/2019  . IR PARACENTESIS  02/28/2019  . IR RADIOLOGIST EVAL & MGMT  06/22/2018  . POLYPECTOMY    . TONSILLECTOMY    . UMBILICAL HERNIA REPAIR    . VARICOSE VEIN SURGERY     x4  . VIDEO BRONCHOSCOPY WITH ENDOBRONCHIAL NAVIGATION N/A 07/26/2018   Procedure: VIDEO BRONCHOSCOPY WITH ENDOBRONCHIAL NAVIGATION;  Surgeon:  [name], MD;  Location: [facility];  Service: Thoracic;  Laterality: N/A;        Home Medications    Prior to Admission medications   Medication Sig Start Date End Date Taking? Authorizing Provider  folic acid (FOLVITE) 1 MG tablet Take 1 tablet (1 mg total) by mouth daily. 09/01/18   Black, Lezlie Octave, NP  furosemide (LASIX) 40 MG tablet TAKE 2 TABLETS BY MOUTH IN THE MORNING AND 1 IN THE EVENING 02/28/19   Ladene Artist, MD  hydrALAZINE (APRESOLINE) 25 MG tablet Take 25 mg by mouth at bedtime.  11/01/12   [provider]  linaclotide (LINZESS) 145 MCG CAPS capsule Take 145 mcg by mouth daily as needed (for constipation).     [provider]  metoprolol succinate (TOPROL-XL) 25 MG 24 hr tablet Take 1 tablet (25 mg total) by mouth at bedtime as needed (for HBP >130). 08/31/18   Black, Lezlie Octave, NP  Multiple Vitamin (MULTIVITAMIN WITH MINERALS) TABS tablet Take 1 tablet by mouth daily. 09/01/18   Black, Lezlie Octave, NP  omeprazole (PRILOSEC) 20 MG capsule Take 1 capsule (20 mg total) by mouth 2 (two) times daily. 03/23/13   Ladene Artist, MD  spironolactone (ALDACTONE) 100 MG tablet Take 1 tablet (100 mg total) by mouth daily. 08/31/18 08/31/19  Radene Gunning, NP  tetrahydrozoline (VISINE) 0.05 % ophthalmic solution Place 2 drops into both eyes daily as needed (for dry eyes).    [provider]  thiamine 100 MG tablet Take 1 tablet (100 mg total) by mouth daily. 09/01/18   Black, Lezlie Octave, NP    Family History Family History  Problem Relation Age of Onset  . Colon polyps Brother   . Prostate cancer Brother   . Heart disease Father        rheumatic fever as child  . Stroke Mother   . Colon cancer Neg Hx   . Esophageal cancer Neg Hx   . Rectal cancer Neg Hx   . Stomach cancer Neg Hx     Social History Social History   Tobacco Use  . Smoking status: Never Smoker  . Smokeless tobacco: Never Used  Substance Use Topics  . Alcohol use: Yes    Comment:  occasaional beer  . Drug use: No     Allergies   Lisinopril, Lipitor [atorvastatin], and Sulfonamide derivatives   Review of Systems Review of Systems  Constitutional: Negative for chills and fever.  HENT: Negative for ear pain and sore throat.   Eyes: Negative for pain and visual disturbance.  Respiratory: Positive for shortness of breath. Negative for cough.   Cardiovascular: Negative for chest pain and palpitations.  Gastrointestinal: Positive for abdominal distention. Negative for abdominal  pain and vomiting.  Genitourinary: Negative for dysuria and hematuria.  Musculoskeletal: Negative for arthralgias and back pain.  Skin: Negative for color change and rash.  Neurological: Negative for seizures and syncope.  All other systems reviewed and are negative.    Physical Exam Updated Vital Signs  ED Triage Vitals  Enc Vitals Group     BP 03/08/19 2000 (!) 167/85     Pulse Rate 03/08/19 2000 83     Resp 03/08/19 2000 (!) 24     Temp 03/08/19 2000 98 F (36.7 C)     Temp Source 03/08/19 2000 Oral     SpO2 03/08/19 2000 100 %     Weight 03/08/19 2001 190 lb (86.2 kg)     Height 03/08/19 2001 5\' 7"  (1.702 m)     Head Circumference --      Peak Flow --      Pain Score 03/08/19 2000 0     Pain Loc --      Pain Edu? --      Excl. in [place]? --     Physical Exam Vitals signs and nursing note reviewed.  Constitutional:      General: He is not in acute distress.    Appearance: He is well-developed. He is obese. He is not ill-appearing.  HENT:     Head: Normocephalic and atraumatic.  Eyes:     Conjunctiva/sclera: Conjunctivae normal.     Pupils: Pupils are equal, round, and reactive to light.  Neck:     Musculoskeletal: Normal range of motion and neck supple.  Cardiovascular:     Rate and Rhythm: Normal rate and regular rhythm.     Pulses: Normal pulses.     Heart sounds: Normal heart sounds. No murmur.  Pulmonary:     Effort: Tachypnea present. No respiratory distress.      Breath sounds: Decreased breath sounds and rhonchi present. No wheezing.  Abdominal:     Palpations: Abdomen is soft.     Tenderness: There is no abdominal tenderness.     Comments: Abdominal distention, with fluid wave  Musculoskeletal:     Right lower leg: Edema (nonpitting) present.     Left lower leg: Edema (nonpitting) present.  Skin:    General: Skin is warm and dry.     Capillary Refill: Capillary refill takes less than 2 seconds.  Neurological:     General: No focal deficit present.     Mental Status: He is alert.  Psychiatric:        Mood and Affect: Mood normal.      ED Treatments / Results  Labs (all labs ordered are listed, but only abnormal results are displayed) Labs Reviewed  COMPREHENSIVE METABOLIC PANEL - Abnormal; Notable for the following components:      Result Value   Sodium 128 (*)    Chloride 91 (*)    Glucose, Bld 120 (*)    Albumin 3.3 (*)    Alkaline Phosphatase 151 (*)    Total Bilirubin 2.1 (*)    All other components within normal limits  CBC - Abnormal; Notable for the following components:   RDW 16.8 (*)    All other components within normal limits  SARS CORONAVIRUS 2 (TAT 6-24 HRS)  LIPASE, BLOOD  BRAIN NATRIURETIC PEPTIDE    EKG EKG Interpretation  Date/Time:  Tuesday March 08 2019 20:01:23 EDT Ventricular Rate:  94 PR Interval:    QRS Duration: 102 QT Interval:  348 QTC Calculation: 435 R  Axis:   -81 Text Interpretation:  Atrial fibrillation with premature ventricular or aberrantly conducted complexes Left anterior fascicular block Septal infarct , age undetermined Abnormal ECG Confirmed by Lennice Sites [phone]) on 03/08/2019 8:09:58 PM   Radiology Dg Chest Portable 1 View  Result Date: 03/08/2019 CLINICAL DATA:  Shortness of breath EXAM: PORTABLE CHEST 1 VIEW COMPARISON:  CT 01/20/2019, radiograph 08/25/2018 FINDINGS: Bilateral pleural effusions with adjacent opacity in the lung bases likely reflecting areas of  atelectasis. More patchy opacities present in the right lung base with central venous congestion and septal thickening. Calcifications are noted along the pericardium, also present on the comparison CT. Atherosclerotic calcification of the aortic arch is noted. No acute osseous or soft tissue abnormality. IMPRESSION: 1. Bilateral pleural effusions with bibasilar atelectasis. 2. Central venous congestion and septal thickening, consistent with interstitial edema. 3. More patchy opacities in the right lung base could reflect superimposed edema or atelectasis 4. Pericardial calcifications, constrictive pericarditis is not excluded. Electronically Signed   By: Lovena Le M.D.   On: 03/08/2019 20:57    Procedures .Paracentesis  Date/Time: 03/09/2019 12:24 AM Performed by: Lennice Sites, DO Authorized by: Lennice Sites, DO   Consent:    Consent obtained:  Written and verbal   Consent given by:  Patient   Risks discussed:  Bleeding, bowel perforation, infection and pain   Alternatives discussed:  Delayed treatment Pre-procedure details:    Procedure purpose:  Therapeutic   Preparation: Patient was prepped and draped in usual sterile fashion   Anesthesia (see MAR for exact dosages):    Anesthesia method:  Local infiltration   Local anesthetic:  Lidocaine 1% w/o epi Procedure details:    Needle gauge:  18   Ultrasound guidance: yes     Puncture site:  R lower quadrant   Fluid removed amount:  2L   Fluid appearance:  Yellow   Dressing:  Adhesive bandage Post-procedure details:    Patient tolerance of procedure:  Tolerated well, no immediate complications   (including critical care time)  Medications Ordered in ED Medications  furosemide (LASIX) injection 40 mg (has no administration in time range)  lidocaine (PF) (XYLOCAINE) 1 % injection 5 mL (5 mLs Infiltration Given 03/08/19 2143)     Initial Impression / Assessment and Plan / ED Course  I have reviewed the triage vital signs and  the nursing notes.  Pertinent labs & imaging results that were available during my care of the patient were reviewed by me and considered in my medical decision making (see chart for details).     [name] is a 78 year old male with history of atrial flutter/atrial fibrillation status post ablation, alcoholic cirrhosis with ascites, hypertension who presents to the ED with shortness of breath, abdominal distention.  Patient with overall unremarkable vitals except for tachypnea in the 30s.  Patient states he is supposed to get a paracentesis tomorrow but has extreme shortness of breath at rest.  Does take diuretics including Lasix and spironolactone.  Recently had some adjustments to his diuretics as he has been struggling with hyponatremia.  Patient has some rhonchi on exam of his lungs.  Has increased work of breathing.  Has abdominal distention but no pain.  Has nonpitting edema in his legs.  EKG shows what appears to be in atrial fibrillation that is rate controlled.  Suspect that symptoms are secondary to volume overload.  Will check basic labs.  Will attempt paracentesis to see if we get improvement of symptoms.  Chest x-ray  shows signs of volume overload, edema, pleural effusions.  Chest x-ray showed some calcifications around the pericardium, questionable constrictive pericarditis as well.  Otherwise lab work is overall unremarkable.  No significant anemia.  Sodium is 128.  Creatinine overall at baseline.  Following 2 L removal of fluid via paracentesis patient still significantly symptomatic.  Believe that he needs admission for echocardiogram, IV Lasix.  Will admit to hospitalist for further work-up given work of breathing, need for echocardiogram, need for likely more fluid taken off via paracentesis.  Hemodynamically stable throughout my care.  Admitted in good condition.  This chart was dictated using voice recognition software.  Despite best efforts to proofread,  errors can occur which can  change the documentation meaning.    Final Clinical Impressions(s) / ED Diagnoses   Final diagnoses:  SOB (shortness of breath)  Hypervolemia, unspecified hypervolemia type    ED Discharge Orders    None       Lennice Sites, DO 03/09/19 7308

## 2019-03-08 NOTE — ED Notes (Signed)
Family at bedside. 

## 2019-03-09 ENCOUNTER — Inpatient Hospital Stay (HOSPITAL_COMMUNITY): Payer: Medicare HMO

## 2019-03-09 ENCOUNTER — Ambulatory Visit (HOSPITAL_COMMUNITY): Admission: RE | Admit: 2019-03-09 | Payer: Medicare HMO | Source: Ambulatory Visit

## 2019-03-09 ENCOUNTER — Encounter (HOSPITAL_COMMUNITY): Payer: Self-pay | Admitting: Internal Medicine

## 2019-03-09 ENCOUNTER — Encounter (HOSPITAL_COMMUNITY): Payer: Self-pay

## 2019-03-09 DIAGNOSIS — K7460 Unspecified cirrhosis of liver: Secondary | ICD-10-CM | POA: Diagnosis not present

## 2019-03-09 DIAGNOSIS — F4310 Post-traumatic stress disorder, unspecified: Secondary | ICD-10-CM | POA: Diagnosis present

## 2019-03-09 DIAGNOSIS — Z8249 Family history of ischemic heart disease and other diseases of the circulatory system: Secondary | ICD-10-CM | POA: Diagnosis not present

## 2019-03-09 DIAGNOSIS — K219 Gastro-esophageal reflux disease without esophagitis: Secondary | ICD-10-CM | POA: Diagnosis present

## 2019-03-09 DIAGNOSIS — E8770 Fluid overload, unspecified: Secondary | ICD-10-CM

## 2019-03-09 DIAGNOSIS — Z888 Allergy status to other drugs, medicaments and biological substances status: Secondary | ICD-10-CM | POA: Diagnosis not present

## 2019-03-09 DIAGNOSIS — I4811 Longstanding persistent atrial fibrillation: Secondary | ICD-10-CM | POA: Diagnosis not present

## 2019-03-09 DIAGNOSIS — J90 Pleural effusion, not elsewhere classified: Secondary | ICD-10-CM | POA: Diagnosis not present

## 2019-03-09 DIAGNOSIS — K31819 Angiodysplasia of stomach and duodenum without bleeding: Secondary | ICD-10-CM | POA: Diagnosis present

## 2019-03-09 DIAGNOSIS — K7030 Alcoholic cirrhosis of liver without ascites: Secondary | ICD-10-CM | POA: Diagnosis not present

## 2019-03-09 DIAGNOSIS — K7031 Alcoholic cirrhosis of liver with ascites: Secondary | ICD-10-CM | POA: Diagnosis present

## 2019-03-09 DIAGNOSIS — J9601 Acute respiratory failure with hypoxia: Secondary | ICD-10-CM

## 2019-03-09 DIAGNOSIS — J9811 Atelectasis: Secondary | ICD-10-CM | POA: Diagnosis not present

## 2019-03-09 DIAGNOSIS — E785 Hyperlipidemia, unspecified: Secondary | ICD-10-CM | POA: Diagnosis present

## 2019-03-09 DIAGNOSIS — I311 Chronic constrictive pericarditis: Secondary | ICD-10-CM | POA: Diagnosis present

## 2019-03-09 DIAGNOSIS — Z882 Allergy status to sulfonamides status: Secondary | ICD-10-CM | POA: Diagnosis not present

## 2019-03-09 DIAGNOSIS — E871 Hypo-osmolality and hyponatremia: Secondary | ICD-10-CM | POA: Diagnosis present

## 2019-03-09 DIAGNOSIS — I10 Essential (primary) hypertension: Secondary | ICD-10-CM | POA: Diagnosis present

## 2019-03-09 DIAGNOSIS — I4892 Unspecified atrial flutter: Secondary | ICD-10-CM | POA: Diagnosis present

## 2019-03-09 DIAGNOSIS — Z823 Family history of stroke: Secondary | ICD-10-CM | POA: Diagnosis not present

## 2019-03-09 DIAGNOSIS — R0602 Shortness of breath: Secondary | ICD-10-CM | POA: Insufficient documentation

## 2019-03-09 DIAGNOSIS — F1010 Alcohol abuse, uncomplicated: Secondary | ICD-10-CM | POA: Diagnosis present

## 2019-03-09 DIAGNOSIS — I4891 Unspecified atrial fibrillation: Secondary | ICD-10-CM | POA: Diagnosis present

## 2019-03-09 DIAGNOSIS — R188 Other ascites: Secondary | ICD-10-CM

## 2019-03-09 DIAGNOSIS — Z8042 Family history of malignant neoplasm of prostate: Secondary | ICD-10-CM | POA: Diagnosis not present

## 2019-03-09 DIAGNOSIS — Z8371 Family history of colonic polyps: Secondary | ICD-10-CM | POA: Diagnosis not present

## 2019-03-09 DIAGNOSIS — Z20828 Contact with and (suspected) exposure to other viral communicable diseases: Secondary | ICD-10-CM | POA: Diagnosis present

## 2019-03-09 HISTORY — DX: Acute respiratory failure with hypoxia: J96.01

## 2019-03-09 HISTORY — PX: IR THORACENTESIS ASP PLEURAL SPACE W/IMG GUIDE: IMG5380

## 2019-03-09 HISTORY — PX: THORACENTESIS: SHX235

## 2019-03-09 LAB — CBC WITH DIFFERENTIAL/PLATELET
Abs Immature Granulocytes: 0.01 10*3/uL (ref 0.00–0.07)
Basophils Absolute: 0 10*3/uL (ref 0.0–0.1)
Basophils Relative: 0 %
Eosinophils Absolute: 0.1 10*3/uL (ref 0.0–0.5)
Eosinophils Relative: 1 %
HCT: 38.1 % — ABNORMAL LOW (ref 39.0–52.0)
Hemoglobin: 13.1 g/dL (ref 13.0–17.0)
Immature Granulocytes: 0 %
Lymphocytes Relative: 7 %
Lymphs Abs: 0.3 10*3/uL — ABNORMAL LOW (ref 0.7–4.0)
MCH: 30.6 pg (ref 26.0–34.0)
MCHC: 34.4 g/dL (ref 30.0–36.0)
MCV: 89 fL (ref 80.0–100.0)
Monocytes Absolute: 0.6 10*3/uL (ref 0.1–1.0)
Monocytes Relative: 13 %
Neutro Abs: 3.8 10*3/uL (ref 1.7–7.7)
Neutrophils Relative %: 79 %
Platelets: 167 10*3/uL (ref 150–400)
RBC: 4.28 MIL/uL (ref 4.22–5.81)
RDW: 17.1 % — ABNORMAL HIGH (ref 11.5–15.5)
WBC: 4.9 10*3/uL (ref 4.0–10.5)
nRBC: 0 % (ref 0.0–0.2)

## 2019-03-09 LAB — GRAM STAIN

## 2019-03-09 LAB — PROTIME-INR
INR: 1.3 — ABNORMAL HIGH (ref 0.8–1.2)
Prothrombin Time: 16.4 seconds — ABNORMAL HIGH (ref 11.4–15.2)

## 2019-03-09 LAB — PROTEIN, PLEURAL OR PERITONEAL FLUID: Total protein, fluid: <3 g/dL

## 2019-03-09 LAB — CBG MONITORING, ED: Glucose-Capillary: 115 mg/dL — ABNORMAL HIGH (ref 70–99)

## 2019-03-09 LAB — BODY FLUID CELL COUNT WITH DIFFERENTIAL
Lymphs, Fluid: 22 %
Monocyte-Macrophage-Serous Fluid: 53 % (ref 50–90)
Neutrophil Count, Fluid: 25 % (ref 0–25)
Total Nucleated Cell Count, Fluid: 345 cu mm (ref 0–1000)

## 2019-03-09 LAB — COMPREHENSIVE METABOLIC PANEL
ALT: 17 U/L (ref 0–44)
AST: 41 U/L (ref 15–41)
Albumin: 2.9 g/dL — ABNORMAL LOW (ref 3.5–5.0)
Alkaline Phosphatase: 134 U/L — ABNORMAL HIGH (ref 38–126)
Anion gap: 12 (ref 5–15)
BUN: 11 mg/dL (ref 8–23)
CO2: 25 mmol/L (ref 22–32)
Calcium: 8.7 mg/dL — ABNORMAL LOW (ref 8.9–10.3)
Chloride: 92 mmol/L — ABNORMAL LOW (ref 98–111)
Creatinine, Ser: 0.79 mg/dL (ref 0.61–1.24)
GFR calc Af Amer: >60 mL/min (ref >60)
GFR calc non Af Amer: >60 mL/min (ref >60)
Glucose, Bld: 119 mg/dL — ABNORMAL HIGH (ref 70–99)
Potassium: 4.2 mmol/L (ref 3.5–5.1)
Sodium: 129 mmol/L — ABNORMAL LOW (ref 135–145)
Total Bilirubin: 3.2 mg/dL — ABNORMAL HIGH (ref 0.3–1.2)
Total Protein: 6.7 g/dL (ref 6.5–8.1)

## 2019-03-09 LAB — GLUCOSE, PLEURAL OR PERITONEAL FLUID: Glucose, Fluid: 148 mg/dL

## 2019-03-09 LAB — HEPATIC FUNCTION PANEL
ALT: 13 U/L (ref 0–44)
AST: 40 U/L (ref 15–41)
Albumin: 2.9 g/dL — ABNORMAL LOW (ref 3.5–5.0)
Alkaline Phosphatase: 134 U/L — ABNORMAL HIGH (ref 38–126)
Bilirubin, Direct: 0.8 mg/dL — ABNORMAL HIGH (ref 0.0–0.2)
Indirect Bilirubin: 2.3 mg/dL — ABNORMAL HIGH (ref 0.3–0.9)
Total Bilirubin: 3.1 mg/dL — ABNORMAL HIGH (ref 0.3–1.2)
Total Protein: 6.6 g/dL (ref 6.5–8.1)

## 2019-03-09 LAB — SARS CORONAVIRUS 2 (TAT 6-24 HRS): SARS Coronavirus 2: NEGATIVE

## 2019-03-09 LAB — ECHOCARDIOGRAM COMPLETE
Height: 67 in
Weight: 3040 oz

## 2019-03-09 LAB — BRAIN NATRIURETIC PEPTIDE: B Natriuretic Peptide: 303.9 pg/mL — ABNORMAL HIGH (ref 0.0–100.0)

## 2019-03-09 LAB — PHOSPHORUS: Phosphorus: 3.3 mg/dL (ref 2.5–4.6)

## 2019-03-09 LAB — MAGNESIUM: Magnesium: 1.9 mg/dL (ref 1.7–2.4)

## 2019-03-09 MED ORDER — ACETAMINOPHEN 325 MG PO TABS: 650.0000 mg | ORAL_TABLET | Freq: Four times a day (QID) | ORAL | Status: DC | PRN

## 2019-03-09 MED ORDER — VITAMIN B-1 100 MG PO TABS
100.0000 mg | ORAL_TABLET | Freq: Every day | ORAL | Status: DC
  Administered 2019-03-09 – 2019-03-11 (×3): 100 mg via ORAL
  Filled 2019-03-09 (×3): qty 1

## 2019-03-09 MED ORDER — FUROSEMIDE 10 MG/ML IJ SOLN
40.0000 mg | Freq: Once | INTRAMUSCULAR | Status: AC
  Administered 2019-03-09: 40 mg via INTRAVENOUS
  Filled 2019-03-09: qty 4

## 2019-03-09 MED ORDER — TRAMADOL HCL 50 MG PO TABS
50.0000 mg | ORAL_TABLET | Freq: Three times a day (TID) | ORAL | Status: DC | PRN
  Administered 2019-03-09 – 2019-03-10 (×2): 50 mg via ORAL
  Filled 2019-03-09 (×3): qty 1

## 2019-03-09 MED ORDER — LORAZEPAM 1 MG PO TABS: 1.0000 mg | ORAL_TABLET | ORAL | Status: DC | PRN

## 2019-03-09 MED ORDER — LIDOCAINE HCL (PF) 1 % IJ SOLN
INTRAMUSCULAR | Status: AC | PRN
  Administered 2019-03-09: 10 mL

## 2019-03-09 MED ORDER — FOLIC ACID 1 MG PO TABS
1.0000 mg | ORAL_TABLET | Freq: Every day | ORAL | Status: DC
  Administered 2019-03-09 – 2019-03-11 (×3): 1 mg via ORAL
  Filled 2019-03-09 (×3): qty 1

## 2019-03-09 MED ORDER — THIAMINE HCL 100 MG/ML IJ SOLN: 100.0000 mg | Freq: Every day | INTRAMUSCULAR | Status: DC

## 2019-03-09 MED ORDER — METOPROLOL SUCCINATE ER 25 MG PO TB24: 25.0000 mg | ORAL_TABLET | Freq: Every evening | ORAL | Status: DC | PRN

## 2019-03-09 MED ORDER — ONDANSETRON HCL 4 MG PO TABS: 4.0000 mg | ORAL_TABLET | Freq: Four times a day (QID) | ORAL | Status: DC | PRN

## 2019-03-09 MED ORDER — LORAZEPAM 2 MG/ML IJ SOLN: 1.0000 mg | INTRAMUSCULAR | Status: DC | PRN

## 2019-03-09 MED ORDER — LIDOCAINE HCL 1 % IJ SOLN
INTRAMUSCULAR | Status: AC
  Filled 2019-03-09: qty 20

## 2019-03-09 MED ORDER — ADULT MULTIVITAMIN W/MINERALS CH
1.0000 | ORAL_TABLET | Freq: Every day | ORAL | Status: DC
  Administered 2019-03-09 – 2019-03-11 (×3): 1 via ORAL
  Filled 2019-03-09 (×3): qty 1

## 2019-03-09 MED ORDER — SODIUM CHLORIDE 0.9 % IV SOLN
2.0000 g | INTRAVENOUS | Status: DC
  Administered 2019-03-10: 2 g via INTRAVENOUS
  Filled 2019-03-09 (×2): qty 20

## 2019-03-09 MED ORDER — SODIUM CHLORIDE 0.9 % IV SOLN
2.0000 g | Freq: Once | INTRAVENOUS | Status: AC
  Administered 2019-03-09: 2 g via INTRAVENOUS
  Filled 2019-03-09: qty 20

## 2019-03-09 MED ORDER — ONDANSETRON HCL 4 MG/2ML IJ SOLN: 4.0000 mg | Freq: Four times a day (QID) | INTRAMUSCULAR | Status: DC | PRN

## 2019-03-09 MED ORDER — SODIUM CHLORIDE 0.9 % IV SOLN: 1.0000 g | INTRAVENOUS | Status: DC

## 2019-03-09 MED ORDER — FUROSEMIDE 10 MG/ML IJ SOLN
40.0000 mg | Freq: Every day | INTRAMUSCULAR | Status: DC
  Administered 2019-03-09 – 2019-03-11 (×3): 40 mg via INTRAVENOUS
  Filled 2019-03-09 (×3): qty 4

## 2019-03-09 MED ORDER — MELATONIN 3 MG PO TABS
3.0000 mg | ORAL_TABLET | Freq: Every day | ORAL | Status: DC
  Administered 2019-03-09 – 2019-03-10 (×2): 3 mg via ORAL
  Filled 2019-03-09 (×3): qty 1

## 2019-03-09 MED ORDER — ACETAMINOPHEN 650 MG RE SUPP: 650.0000 mg | Freq: Four times a day (QID) | RECTAL | Status: DC | PRN

## 2019-03-09 NOTE — ED Notes (Signed)
Echo being performed at bedside

## 2019-03-09 NOTE — H&P (Addendum)
History and Physical    [name] OZD:[id number] DOB: Feb 03, 1941 DOA: 03/08/2019  PCP: Shon Baton, MD  Patient coming from: Home.  Chief Complaint: Shortness of breath and abdominal distention.  HPI: [name] is a 78 y.o. male with history of alcoholic liver cirrhosis, atrial flutter status post ablation presently rate controlled on beta-blockers, hypertension, history of portal gastropathy with AVMs presents to the ER because of increasing abdominal distention and acute worsening of shortness of breath.  Patient states he is scheduled to get paracentesis every 2 weeks now and he was due for next week but because of worsening abdominal distention and he became acutely short of breath over the last 24 hours patient decided to come to the ER.  Denies any abdominal pain nausea vomiting chest pain fever chills or any diarrhea.  Patient states he has been taking his Lasix and spironolactone.  Spironolactone dose has been recently decreased per his primary care physician as per the patient.  ED Course: In the ER x-ray showed bilateral pleural effusion with features concerning for interstitial edema and also pericardial calcification with constrictive pericarditis noted overall clinically shows A. fib rate controlled.  ER physician attempted paracentesis and was able to remove 2 L but patient still short of breath despite doing fluids.  40 mg IV Lasix was given in the ER and patient is being admitted for acute respiratory failure hypoxia and decompensated liver cirrhosis.  Labs show creatinine 1.9 sodium 128 alkaline phosphatase 151 albumin 3.3 lipase 34 AST 25 ALT 215 BNP 303 platelets 191 hemoglobin 13.4 COVID-19 test was negative urine  Review of Systems: As per HPI, rest all negative.   Past Medical History:  Diagnosis Date   Adenomatous polyps 06/2004   Agent orange exposure    Alcoholic cirrhosis of liver with ascites (HCC)    Alcoholism ([place])    Allergy    Anxiety    PTSD- no  meds   Atrial fibrillation (HCC)    Atrial flutter (HCC)    had ablation    Chronic kidney disease    "beginnings of kidney failure"- 3-4 years ago   Depression    Diabetes mellitus without complication (HCC)    no meds   Diverticulosis    GERD (gastroesophageal reflux disease)    Hemorrhoids    Hiatal hernia    Hx of hernia repair    Hyperlipidemia    Hypertension    Hyponatremia 08/2018   Iron deficiency anemia    Lung nodules    bilateral   Pedal edema    Pneumonia    06-2016   PTSD (post-traumatic stress disorder)    Substance abuse ([place])    alcohol use   Ulcer    Varicose veins    Wears dentures    Wears glasses    Wears hearing aid    B/L    Past Surgical History:  Procedure Laterality Date   ATRIAL ABLATION SURGERY     COLONOSCOPY     FUDUCIAL PLACEMENT N/A 07/26/2018   Procedure: PLACEMENT OF FUDUCIAL;  Surgeon: [name], MD;  Location: [facility];  Service: Thoracic;  Laterality: N/A;   IR PARACENTESIS  10/28/2016   IR PARACENTESIS  11/12/2016   IR PARACENTESIS  12/02/2016   IR PARACENTESIS  12/10/2016   IR PARACENTESIS  12/30/2016   IR PARACENTESIS  01/27/2017   IR PARACENTESIS  09/09/2017   IR PARACENTESIS  03/04/2018   IR PARACENTESIS  04/08/2018   IR PARACENTESIS  05/10/2018   IR PARACENTESIS  05/21/2018   IR PARACENTESIS  06/16/2018   IR PARACENTESIS  07/23/2018   IR PARACENTESIS  10/06/2018   IR PARACENTESIS  10/28/2018   IR PARACENTESIS  01/12/2019   IR PARACENTESIS  01/28/2019   IR PARACENTESIS  02/04/2019   IR PARACENTESIS  02/15/2019   IR PARACENTESIS  02/28/2019   IR RADIOLOGIST EVAL & MGMT  06/22/2018   POLYPECTOMY     TONSILLECTOMY     UMBILICAL HERNIA REPAIR     VARICOSE VEIN SURGERY     x4   VIDEO BRONCHOSCOPY WITH ENDOBRONCHIAL NAVIGATION N/A 07/26/2018   Procedure: VIDEO BRONCHOSCOPY WITH ENDOBRONCHIAL NAVIGATION;  Surgeon: [name], MD;  Location: [facility];  Service: Thoracic;   Laterality: N/A;     reports that he has never smoked. He has never used smokeless tobacco. He reports current alcohol use. He reports that he does not use drugs.  Allergies  Allergen Reactions   Lisinopril Anaphylaxis and Other (See Comments)    Hyperkalemia, Dizziness    Lipitor [Atorvastatin] Other (See Comments)    Marked leg fatigue   Sulfonamide Derivatives Other (See Comments)    UNSPECIFIED REACTION  [Unsure of reaction]    Family History  Problem Relation Age of Onset   Colon polyps Brother    Prostate cancer Brother    Heart disease Father        rheumatic fever as child   Stroke Mother    Colon cancer Neg Hx    Esophageal cancer Neg Hx    Rectal cancer Neg Hx    Stomach cancer Neg Hx     Prior to Admission medications   Medication Sig Start Date End Date Taking? Authorizing Provider  furosemide (LASIX) 40 MG tablet TAKE 2 TABLETS BY MOUTH IN THE MORNING AND 1 IN THE EVENING Patient taking differently: Take 40 mg by mouth 2 (two) times daily.  02/28/19  Yes Ladene Artist, MD  Multiple Vitamin (MULTIVITAMIN WITH MINERALS) TABS tablet Take 1 tablet by mouth daily. 09/01/18  Yes Black, Lezlie Octave, NP  spironolactone (ALDACTONE) 100 MG tablet Take 1 tablet (100 mg total) by mouth daily. Patient taking differently: Take 50 mg by mouth 2 (two) times daily.  08/31/18 08/31/19 Yes Black, Lezlie Octave, NP  thiamine 100 MG tablet Take 1 tablet (100 mg total) by mouth daily. 09/01/18  Yes Black, Lezlie Octave, NP  folic acid (FOLVITE) 1 MG tablet Take 1 tablet (1 mg total) by mouth daily. Patient not taking: Reported on 03/09/2019 09/01/18   Radene Gunning, NP  metoprolol succinate (TOPROL-XL) 25 MG 24 hr tablet Take 1 tablet (25 mg total) by mouth at bedtime as needed (for HBP >130). Patient not taking: Reported on 03/09/2019 08/31/18   Radene Gunning, NP  omeprazole (PRILOSEC) 20 MG capsule Take 1 capsule (20 mg total) by mouth 2 (two) times daily. Patient not taking: Reported  on 03/09/2019 03/23/13   Ladene Artist, MD    Physical Exam: Constitutional: Moderately built and nourished. Vitals:   03/09/19 0045 03/09/19 0100 03/09/19 0115 03/09/19 0130  BP: (!) 155/105 (!) 156/91 (!) 141/89 133/75  Pulse: 91 (!) 36 (!) 112 86  Resp: (!) 30 (!) 33 (!) 30 (!) 23  Temp:      TempSrc:      SpO2: 100% 96% 97% 98%  Weight:      Height:       Eyes: Anicteric no pallor apparent ENMT: No discharge  from the ears eyes nose or mouth. Neck: No mass felt.  No neck rigidity. Respiratory: No rhonchi or crepitations. Cardiovascular: S1-S2 heard. Abdomen: Soft distended nontender bowel sounds present. Musculoskeletal: No edema. Skin: No rash. Neurologic: Alert awake oriented to time place and person.  Moves all extremities. Psychiatric: Appears normal per normal affect.   Labs on Admission: I have personally reviewed following labs and imaging studies  CBC: Recent Labs  Lab 03/08/19 2010  WBC 6.6  HGB 13.4  HCT 39.7  MCV 89.2  PLT 601   Basic Metabolic Panel: Recent Labs  Lab 03/08/19 2010  NA 128*  K 3.6  CL 91*  CO2 25  GLUCOSE 120*  BUN 12  CREATININE 0.96  CALCIUM 8.9   GFR: Estimated Creatinine Clearance: 69.7 mL/min (by C-G formula based on SCr of 0.96 mg/dL). Liver Function Tests: Recent Labs  Lab 03/08/19 2010  AST 25  ALT 15  ALKPHOS 151*  BILITOT 2.1*  PROT 7.6  ALBUMIN 3.3*   Recent Labs  Lab 03/08/19 2010  LIPASE 34   No results for input(s): AMMONIA in the last 168 hours. Coagulation Profile: No results for input(s): INR, PROTIME in the last 168 hours. Cardiac Enzymes: No results for input(s): CKTOTAL, CKMB, CKMBINDEX, TROPONINI in the last 168 hours. BNP (last 3 results) No results for input(s): PROBNP in the last 8760 hours. HbA1C: No results for input(s): HGBA1C in the last 72 hours. CBG: No results for input(s): GLUCAP in the last 168 hours. Lipid Profile: No results for input(s): CHOL, HDL, LDLCALC, TRIG,  CHOLHDL, LDLDIRECT in the last 72 hours. Thyroid Function Tests: No results for input(s): TSH, T4TOTAL, FREET4, T3FREE, THYROIDAB in the last 72 hours. Anemia Panel: No results for input(s): VITAMINB12, FOLATE, FERRITIN, TIBC, IRON, RETICCTPCT in the last 72 hours. Urine analysis:    Component Value Date/Time   COLORURINE YELLOW 08/25/2018 1500   APPEARANCEUR CLEAR 08/25/2018 1500   LABSPEC 1.014 08/25/2018 1500   PHURINE 6.0 08/25/2018 1500   GLUCOSEU NEGATIVE 08/25/2018 1500   HGBUR NEGATIVE 08/25/2018 1500   BILIRUBINUR NEGATIVE 08/25/2018 1500   KETONESUR NEGATIVE 08/25/2018 1500   PROTEINUR 30 (A) 08/25/2018 1500   NITRITE NEGATIVE 08/25/2018 1500   LEUKOCYTESUR NEGATIVE 08/25/2018 1500   Sepsis Labs: @LABRCNTIP (procalcitonin:4,lacticidven:4) )No results found for this or any previous visit (from the past 240 hour(s)).   Radiological Exams on Admission: Dg Chest Portable 1 View  Result Date: 03/08/2019 CLINICAL DATA:  Shortness of breath EXAM: PORTABLE CHEST 1 VIEW COMPARISON:  CT 01/20/2019, radiograph 08/25/2018 FINDINGS: Bilateral pleural effusions with adjacent opacity in the lung bases likely reflecting areas of atelectasis. More patchy opacities present in the right lung base with central venous congestion and septal thickening. Calcifications are noted along the pericardium, also present on the comparison CT. Atherosclerotic calcification of the aortic arch is noted. No acute osseous or soft tissue abnormality. IMPRESSION: 1. Bilateral pleural effusions with bibasilar atelectasis. 2. Central venous congestion and septal thickening, consistent with interstitial edema. 3. More patchy opacities in the right lung base could reflect superimposed edema or atelectasis 4. Pericardial calcifications, constrictive pericarditis is not excluded. Electronically Signed   By: Lovena Le M.D.   On: 03/08/2019 20:57    EKG: Independently reviewed.  A. fib rate controlled at 94  bpm.  Assessment/Plan Principal Problem:   Acute respiratory failure with hypoxia (HCC) Active Problems:   Atrial fibrillation (HCC)   Cirrhosis, alcoholic (HCC)   Ascites    1. Acute respiratory failure  with hypoxia likely from fluid overload from decompensated liver cirrhosis however x-ray does show possibility of constrictive pericarditis and BNP is elevated.  Will check 2D echo.  Continue with Lasix closely follow intake output metabolic panel.  Will order ultrasound-guided paracentesis.  Check labs. 2. Decompensated liver cirrhosis with ascites no definite signs of any SBP.  For now we will keep patient on antibiotics and discontinue if labs are normal show any signs.  On Lasix.  Holding spironolactone for now due to hyponatremia. 3. History of A. fib flutter status post ablation presently rate controlled. 4. Alcohol abuse on CIWA.  Since patient is acutely short of breath and will need close management with intake output Daily weights and will need more than 2 midnight stay patient be admitted as inpatient.  Patient's medications needs to be verified particularly patient beta-blocker is not in the list.   DVT prophylaxis: SCDs anticipation of procedure. Code Status: Full code. Family Communication: Discussed with patient. Disposition Plan: Home. Consults called: Cardiology. Admission status: Inpatient.   Rise Patience MD [facility] Pager [phone].  If 7PM-7AM, please contact night-coverage [email or web address] Password [email or web address]  03/09/2019, 1:39 AM

## 2019-03-09 NOTE — ED Notes (Signed)
ED TO INPATIENT HANDOFF REPORT  ED Nurse Name and Phone #: Annie Main [phone]  G Name/Age/Gender [name] 78 y.o. male Room/Bed: 054C/054C  Code Status   Code Status: Full Code  Home/SNF/Other Home Patient oriented to: self, place, time and situation Is this baseline? Yes   Triage Complete: Triage complete  Chief Complaint sob  Triage Note Pt reports SOB while watching tv this evening. Resp labored in triage. Hx of liver problems, paracentesis scheduled for tomorrow.    Allergies Allergies  Allergen Reactions  . Lisinopril Anaphylaxis and Other (See Comments)    Hyperkalemia, Dizziness   . Lipitor [Atorvastatin] Other (See Comments)    Marked leg fatigue  . Sulfonamide Derivatives Other (See Comments)    UNSPECIFIED REACTION  [Unsure of reaction]    Level of Care/Admitting Diagnosis ED Disposition    ED Disposition Condition [facility]: [place] [[id number]]  Level of Care: Progressive [102]  Covid Evaluation: Asymptomatic Screening Protocol (No Symptoms)  Diagnosis: Acute respiratory failure with hypoxia [facility]) [[id number]]  Admitting Physician: Rise Patience [phone]  Attending Physician: Rise Patience [phone]  Estimated length of stay: past midnight tomorrow  Certification:: I certify this patient will need inpatient services for at least 2 midnights  PT Class (Do Not Modify): Inpatient [101]  PT Acc Code (Do Not Modify): Private [1]       B Medical/Surgery History Past Medical History:  Diagnosis Date  . Adenomatous polyps 06/2004  . Agent orange exposure   . Alcoholic cirrhosis of liver with ascites ([place])   . Alcoholism ([place])   . Allergy   . Anxiety    PTSD- no meds  . Atrial fibrillation ([place])   . Atrial flutter ([place])    had ablation   . Chronic kidney disease    "beginnings of kidney failure"- 3-4 years ago  . Depression   . Diabetes mellitus without complication (HCC)    no meds  . Diverticulosis    . GERD (gastroesophageal reflux disease)   . Hemorrhoids   . Hiatal hernia   . Hx of hernia repair   . Hyperlipidemia   . Hypertension   . Hyponatremia 08/2018  . Iron deficiency anemia   . Lung nodules    bilateral  . Pedal edema   . Pneumonia    06-2016  . PTSD (post-traumatic stress disorder)   . Substance abuse ([place])    alcohol use  . Ulcer   . Varicose veins   . Wears dentures   . Wears glasses   . Wears hearing aid    B/L   Past Surgical History:  Procedure Laterality Date  . ATRIAL ABLATION SURGERY    . COLONOSCOPY    . FUDUCIAL PLACEMENT N/A 07/26/2018   Procedure: PLACEMENT OF FUDUCIAL;  Surgeon: [name], MD;  Location: [facility];  Service: Thoracic;  Laterality: N/A;  . IR PARACENTESIS  10/28/2016  . IR PARACENTESIS  11/12/2016  . IR PARACENTESIS  12/02/2016  . IR PARACENTESIS  12/10/2016  . IR PARACENTESIS  12/30/2016  . IR PARACENTESIS  01/27/2017  . IR PARACENTESIS  09/09/2017  . IR PARACENTESIS  03/04/2018  . IR PARACENTESIS  04/08/2018  . IR PARACENTESIS  05/10/2018  . IR PARACENTESIS  05/21/2018  . IR PARACENTESIS  06/16/2018  . IR PARACENTESIS  07/23/2018  . IR PARACENTESIS  10/06/2018  . IR PARACENTESIS  10/28/2018  . IR PARACENTESIS  01/12/2019  . IR PARACENTESIS  01/28/2019  . IR PARACENTESIS  02/04/2019  . IR PARACENTESIS  02/15/2019  . IR PARACENTESIS  02/28/2019  . IR RADIOLOGIST EVAL & MGMT  06/22/2018  . IR THORACENTESIS ASP PLEURAL SPACE W/IMG GUIDE  03/09/2019  . POLYPECTOMY    . TONSILLECTOMY    . UMBILICAL HERNIA REPAIR    . VARICOSE VEIN SURGERY     x4  . VIDEO BRONCHOSCOPY WITH ENDOBRONCHIAL NAVIGATION N/A 07/26/2018   Procedure: VIDEO BRONCHOSCOPY WITH ENDOBRONCHIAL NAVIGATION;  Surgeon: [name], MD;  Location: [facility];  Service: Thoracic;  Laterality: N/A;     A IV Location/Drains/Wounds Patient Lines/Drains/Airways Status   Active Line/Drains/Airways    Name:   Placement date:   Placement time:   Site:   Days:    Peripheral IV 03/09/19 Left;Posterior Forearm   03/09/19    0043    Forearm   less than 1   Incision (Closed) 07/26/18 N/A Other (Comment)   07/26/18    1023     226   Wound 10/07/12 Other (Comment) Leg Bilateral;Lower itchy, scab-like, red discolored patchy areas   10/07/12    2039    Leg   2344          Intake/Output Last 24 hours  Intake/Output Summary (Last 24 hours) at 03/09/2019 1622 Last data filed at 03/09/2019 1400 Gross per 24 hour  Intake 334 ml  Output 4175 ml  Net -3841 ml    Labs/Imaging Results for orders placed or performed during the hospital encounter of 03/08/19 (from the past 48 hour(s))  Lipase, blood     Status: None   Collection Time: 03/08/19  8:10 PM  Result Value Ref Range   Lipase 34 11 - 51 U/L    Comment: Performed at [facility], [place] [address], [place], [place] [address]  Comprehensive metabolic panel     Status: Abnormal   Collection Time: 03/08/19  8:10 PM  Result Value Ref Range   Sodium 128 (L) 135 - 145 mmol/L   Potassium 3.6 3.5 - 5.1 mmol/L   Chloride 91 (L) 98 - 111 mmol/L   CO2 25 22 - 32 mmol/L   Glucose, Bld 120 (H) 70 - 99 mg/dL   BUN 12 8 - 23 mg/dL   Creatinine, Ser 0.96 0.61 - 1.24 mg/dL   Calcium 8.9 8.9 - 10.3 mg/dL   Total Protein 7.6 6.5 - 8.1 g/dL   Albumin 3.3 (L) 3.5 - 5.0 g/dL   AST 25 15 - 41 U/L   ALT 15 0 - 44 U/L   Alkaline Phosphatase 151 (H) 38 - 126 U/L   Total Bilirubin 2.1 (H) 0.3 - 1.2 mg/dL   GFR calc non Af Amer >60 >60 mL/min   GFR calc Af Amer >60 >60 mL/min   Anion gap 12 5 - 15    Comment: Performed at [facility], [place] [address] [place], [place] [address]  CBC     Status: Abnormal   Collection Time: 03/08/19  8:10 PM  Result Value Ref Range   WBC 6.6 4.0 - 10.5 K/uL   RBC 4.45 4.22 - 5.81 MIL/uL   Hemoglobin 13.4 13.0 - 17.0 g/dL   HCT 39.7 39.0 - 52.0 %   MCV 89.2 80.0 - 100.0 fL   MCH 30.1 26.0 - 34.0 pg   MCHC 33.8 30.0 - 36.0 g/dL   RDW 16.8 (H) 11.5 - 15.5 %   Platelets  191 150 - 400 K/uL  nRBC 0.0 0.0 - 0.2 %    Comment: Performed at [facility], [place] [address] [place], [place] [address]  SARS CORONAVIRUS 2 (TAT 6-24 HRS) Nasopharyngeal Nasopharyngeal Swab     Status: None   Collection Time: 03/09/19 12:34 AM   Specimen: Nasopharyngeal Swab  Result Value Ref Range   SARS Coronavirus 2 NEGATIVE NEGATIVE    Comment: (NOTE) SARS-CoV-2 target nucleic acids are NOT DETECTED. The SARS-CoV-2 RNA is generally detectable in upper and lower respiratory specimens during the acute phase of infection. Negative results do not preclude SARS-CoV-2 infection, do not rule out co-infections with other pathogens, and should not be used as the sole basis for treatment or other patient management decisions. Negative results must be combined with clinical observations, patient history, and epidemiological information. The expected result is Negative. Fact Sheet for Patients: SugarRoll.be Fact Sheet for Healthcare Providers: https://www.woods-mathews.com/ This test is not yet approved or cleared by the [place] FDA and  has been authorized for detection and/or diagnosis of SARS-CoV-2 by FDA under an Emergency Use Authorization (EUA). This EUA will remain  in effect (meaning this test can be used) for the duration of the COVID-19 declaration under Section 56 4(b)(1) of the Act, 21 U.S.C. section 360bbb-3(b)(1), unless the authorization is terminated or revoked sooner. Performed at [facility], [place] [address] [place], [place] [address]   Brain natriuretic peptide     Status: Abnormal   Collection Time: 03/09/19 12:34 AM  Result Value Ref Range   B Natriuretic Peptide 303.9 (H) 0.0 - 100.0 pg/mL    Comment: Performed at [facility] [address], [place], [place] [address]  CBG monitoring, ED     Status: Abnormal   Collection Time: 03/09/19  2:47 AM  Result Value Ref Range   Glucose-Capillary 115  (H) 70 - 99 mg/dL  Comprehensive metabolic panel     Status: Abnormal   Collection Time: 03/09/19  7:09 AM  Result Value Ref Range   Sodium 129 (L) 135 - 145 mmol/L   Potassium 4.2 3.5 - 5.1 mmol/L    Comment: SLIGHT HEMOLYSIS   Chloride 92 (L) 98 - 111 mmol/L   CO2 25 22 - 32 mmol/L   Glucose, Bld 119 (H) 70 - 99 mg/dL   BUN 11 8 - 23 mg/dL   Creatinine, Ser 0.79 0.61 - 1.24 mg/dL   Calcium 8.7 (L) 8.9 - 10.3 mg/dL   Total Protein 6.7 6.5 - 8.1 g/dL   Albumin 2.9 (L) 3.5 - 5.0 g/dL   AST 41 15 - 41 U/L   ALT 17 0 - 44 U/L   Alkaline Phosphatase 134 (H) 38 - 126 U/L   Total Bilirubin 3.2 (H) 0.3 - 1.2 mg/dL   GFR calc non Af Amer >60 >60 mL/min   GFR calc Af Amer >60 >60 mL/min   Anion gap 12 5 - 15    Comment: Performed at [facility], [place] [address], [place], [place] [address]  Magnesium     Status: None   Collection Time: 03/09/19  7:09 AM  Result Value Ref Range   Magnesium 1.9 1.7 - 2.4 mg/dL    Comment: Performed at [facility] [address], [place], [place] [address]  Phosphorus     Status: None   Collection Time: 03/09/19  7:09 AM  Result Value Ref Range   Phosphorus 3.3 2.5 - 4.6 mg/dL    Comment: Performed at [facility]  [facility], [place] [address] [place], [place] [address]  CBC with Differential/Platelet     Status: Abnormal   Collection Time: 03/09/19  7:09 AM  Result Value Ref Range   WBC 4.9 4.0 - 10.5 K/uL   RBC 4.28 4.22 - 5.81 MIL/uL   Hemoglobin 13.1 13.0 - 17.0 g/dL   HCT 38.1 (L) 39.0 - 52.0 %   MCV 89.0 80.0 - 100.0 fL   MCH 30.6 26.0 - 34.0 pg   MCHC 34.4 30.0 - 36.0 g/dL   RDW 17.1 (H) 11.5 - 15.5 %   Platelets 167 150 - 400 K/uL   nRBC 0.0 0.0 - 0.2 %   Neutrophils Relative % 79 %   Neutro Abs 3.8 1.7 - 7.7 K/uL   Lymphocytes Relative 7 %   Lymphs Abs 0.3 (L) 0.7 - 4.0 K/uL   Monocytes Relative 13 %   Monocytes Absolute 0.6 0.1 - 1.0 K/uL   Eosinophils Relative 1 %   Eosinophils Absolute 0.1 0.0 - 0.5 K/uL   Basophils  Relative 0 %   Basophils Absolute 0.0 0.0 - 0.1 K/uL   Immature Granulocytes 0 %   Abs Immature Granulocytes 0.01 0.00 - 0.07 K/uL    Comment: Performed at [facility], [place] [address] [place], [place] [address]  Hepatic function panel     Status: Abnormal   Collection Time: 03/09/19  7:09 AM  Result Value Ref Range   Total Protein 6.6 6.5 - 8.1 g/dL    Comment: SLIGHT HEMOLYSIS   Albumin 2.9 (L) 3.5 - 5.0 g/dL   AST 40 15 - 41 U/L   ALT 13 0 - 44 U/L   Alkaline Phosphatase 134 (H) 38 - 126 U/L   Total Bilirubin 3.1 (H) 0.3 - 1.2 mg/dL   Bilirubin, Direct 0.8 (H) 0.0 - 0.2 mg/dL   Indirect Bilirubin 2.3 (H) 0.3 - 0.9 mg/dL    Comment: Performed at [facility] [address] [place], [place] [address]  Protime-INR     Status: Abnormal   Collection Time: 03/09/19  9:07 AM  Result Value Ref Range   Prothrombin Time 16.4 (H) 11.4 - 15.2 seconds   INR 1.3 (H) 0.8 - 1.2    Comment: (NOTE) INR goal varies based on device and disease states. Performed at [facility], [place] [address], [place], [place] [address]   Body fluid cell count with differential     Status: Abnormal   Collection Time: 03/09/19 10:49 AM  Result Value Ref Range   Fluid Type-FCT Lung, Left    Color, Fluid YELLOW YELLOW   Appearance, Fluid CLOUDY (A) CLEAR   Total Nucleated Cell Count, Fluid 345 0 - 1,000 cu mm   Neutrophil Count, Fluid 25 0 - 25 %   Lymphs, Fluid 22 %   Monocyte-Macrophage-Serous Fluid 53 50 - 90 %   Other Cells, Fluid Mesothelial  cell present %    Comment: Performed at [facility] [address] [place], [place], [place] [address]  Gram stain     Status: None   Collection Time: 03/09/19 10:49 AM   Specimen: Lung, Left; Pleural Fluid  Result Value Ref Range   Specimen Description PLEURAL LEFT    Special Requests NONE    Gram Stain      RARE WBC PRESENT, PREDOMINANTLY MONONUCLEAR NO ORGANISMS SEEN Performed at [facility], [place] [address] [place], [place]  [address]    Report Status  03/09/2019 FINAL   Protein, pleural or peritoneal fluid     Status: None   Collection Time: 03/09/19 10:49 AM  Result Value Ref Range   Total protein, fluid <3.0 g/dL    Comment: REPEATED TO VERIFY   Fluid Type-FTP Lung, Left     Comment: Performed at [facility] [address], [place], [place] [address]  Glucose, pleural or peritoneal fluid     Status: None   Collection Time: 03/09/19 10:49 AM  Result Value Ref Range   Glucose, Fluid 148 mg/dL    Comment: (NOTE) No normal range established for this test Results should be evaluated in conjunction with serum values    Fluid Type-FGLU Lung, Left     Comment: Performed at [facility] [address] [place], [place] [address]   Dg Chest 1 View  Result Date: 03/09/2019 CLINICAL DATA:  78 year old male status post left side ultrasound-guided thoracentesis this morning. EXAM: CHEST  1 VIEW COMPARISON:  03/08/2019 portable chest and earlier. FINDINGS: AP upright view at 1038 hours. Regressed left-side pleural effusion with improved left lung base ventilation. No pneumothorax. Stable right pleural effusion. Stable cardiac size and mediastinal contours. No areas of worsening ventilation. IMPRESSION: 1. Regressed left pleural effusion and improved ventilation at the left lung base. No pneumothorax. 2. Stable right pleural effusion. Electronically Signed   By: Genevie Ann M.D.   On: 03/09/2019 10:46   Dg Chest Portable 1 View  Result Date: 03/08/2019 CLINICAL DATA:  Shortness of breath EXAM: PORTABLE CHEST 1 VIEW COMPARISON:  CT 01/20/2019, radiograph 08/25/2018 FINDINGS: Bilateral pleural effusions with adjacent opacity in the lung bases likely reflecting areas of atelectasis. More patchy opacities present in the right lung base with central venous congestion and septal thickening. Calcifications are noted along the pericardium, also present on the comparison CT. Atherosclerotic calcification of the aortic arch  is noted. No acute osseous or soft tissue abnormality. IMPRESSION: 1. Bilateral pleural effusions with bibasilar atelectasis. 2. Central venous congestion and septal thickening, consistent with interstitial edema. 3. More patchy opacities in the right lung base could reflect superimposed edema or atelectasis 4. Pericardial calcifications, constrictive pericarditis is not excluded. Electronically Signed   By: Lovena Le M.D.   On: 03/08/2019 20:57   Ir Abdomen US Limited  Result Date: 03/09/2019 CLINICAL DATA:  Cirrhosis, abdominal discomfort, history of recurrent large volume abdominal ascites. EXAM: LIMITED ABDOMEN ULTRASOUND FOR ASCITES TECHNIQUE: Limited ultrasound survey for ascites was performed in all four abdominal quadrants. COMPARISON:  02/28/2019 and previous FINDINGS: Survey ultrasound shows minimal abdominal ascites. IMPRESSION: Minimal ascites.  Paracentesis deferred. Electronically Signed   By: Lucrezia Europe M.D.   On: 03/09/2019 10:29   Ir Thoracentesis Asp Pleural Space W/img Guide  Result Date: 26/94/8546 INDICATION: Folic cirrhosis. Left pleural effusion. Request for diagnostic and therapeutic thoracentesis. EXAM: ULTRASOUND GUIDED LEFT THORACENTESIS MEDICATIONS: 1% lidocaine 10 mL COMPLICATIONS: None immediate. PROCEDURE: An ultrasound guided thoracentesis was thoroughly discussed with the patient and questions answered. The benefits, risks, alternatives and complications were also discussed. The patient understands and wishes to proceed with the procedure. Written consent was obtained. Ultrasound was performed to localize and mark an adequate pocket of fluid in the left chest. The area was then prepped and draped in the normal sterile fashion. 1% Lidocaine was used for local anesthesia. Under ultrasound guidance a 6 Fr Safe-T-Centesis catheter was introduced. Thoracentesis was performed. The catheter was removed and a dressing applied. FINDINGS: A total of approximately 1.2  L of hazy  yellow fluid was removed. Samples were sent to the laboratory as requested by the clinical team. IMPRESSION: Successful ultrasound guided left thoracentesis yielding 1.2 L of pleural fluid. No pneumothorax on post-procedure chest x-ray. Read by: Gareth Eagle, PA-C Electronically Signed   By: Jacqulynn Cadet M.D.   On: 03/09/2019 11:23    Pending Labs Unresulted Labs (From admission, onward)    Start     Ordered   03/10/19 0500  Comprehensive metabolic panel  Daily,   R     03/09/19 1415   03/10/19 0500  CBC  Daily,   R     03/09/19 1415   03/09/19 1049  Culture, body fluid-bottle  Once,   R     03/09/19 1049          Vitals/Pain Today's Vitals   03/09/19 1340 03/09/19 1400 03/09/19 1500 03/09/19 1540  BP: 109/68 109/68 110/65 123/73  Pulse:    86  Resp: 20 (!) 26 19 18   Temp:      TempSrc:      SpO2:    98%  Weight:      Height:      PainSc:        Isolation Precautions No active isolations  Medications Medications  acetaminophen (TYLENOL) tablet 650 mg (has no administration in time range)    Or  acetaminophen (TYLENOL) suppository 650 mg (has no administration in time range)  ondansetron (ZOFRAN) tablet 4 mg (has no administration in time range)    Or  ondansetron (ZOFRAN) injection 4 mg (has no administration in time range)  LORazepam (ATIVAN) tablet 1-4 mg (has no administration in time range)    Or  LORazepam (ATIVAN) injection 1-4 mg (has no administration in time range)  thiamine (VITAMIN B-1) tablet 100 mg (100 mg Oral Given 03/09/19 1140)    Or  thiamine (B-1) injection 100 mg ( Intravenous See Alternative 62/13/08 6578)  folic acid (FOLVITE) tablet 1 mg (1 mg Oral Given 03/09/19 1140)  multivitamin with minerals tablet 1 tablet (1 tablet Oral Given 03/09/19 1140)  furosemide (LASIX) injection 40 mg (40 mg Intravenous Given 03/09/19 1143)  cefTRIAXone (ROCEPHIN) 2 g in sodium chloride 0.9 % 100 mL IVPB (has no administration in time range)  lidocaine  (XYLOCAINE) 1 % (with pres) injection (has no administration in time range)  lidocaine (PF) (XYLOCAINE) 1 % injection (10 mLs Infiltration Given 03/09/19 1020)  metoprolol succinate (TOPROL-XL) 24 hr tablet 25 mg (has no administration in time range)  lidocaine (PF) (XYLOCAINE) 1 % injection 5 mL (5 mLs Infiltration Given 03/08/19 2143)  furosemide (LASIX) injection 40 mg (40 mg Intravenous Given 03/09/19 0047)  cefTRIAXone (ROCEPHIN) 2 g in sodium chloride 0.9 % 100 mL IVPB (0 g Intravenous Stopped 03/09/19 0913)    Mobility walks with person assist Low fall risk   Focused Assessments Pulmonary Assessment Handoff:  Lung sounds: Bilateral Breath Sounds: Diminished, Fine crackles L Breath Sounds: Diminished, Fine crackles R Breath Sounds: Diminished, Fine crackles O2 Device: Room Air        R Recommendations: See Admitting Provider Note  Report given to:   Additional Notes:

## 2019-03-09 NOTE — ED Notes (Signed)
Family at bedside. 

## 2019-03-09 NOTE — Progress Notes (Signed)
Patient arrived via stretcher from ED, Placed on telemetry box #9. Patient has a band aide to right foot and refuse nursing staff to remove. Pt stated it will start squaring blood if we remove. Site is clean dry and intact and dressing lefty in place per patient request. Patient lives alone at home  And son is very close by.  Bed alarm set and unit flow, visitor  And mask policy reviewed with patient.

## 2019-03-09 NOTE — ED Notes (Signed)
Ordered diet tray for pt  

## 2019-03-09 NOTE — ED Notes (Signed)
Nurse state they will try and pull INR off IV

## 2019-03-09 NOTE — ED Notes (Signed)
SDU  Breakfast ordered  

## 2019-03-09 NOTE — Plan of Care (Signed)

## 2019-03-09 NOTE — ED Notes (Signed)
Lunch tray set up at bedside

## 2019-03-09 NOTE — ED Notes (Addendum)
Consent obtained post procedure. Pt gave verbal consent prior to procedure. All questions were answered by the provider prior to start of procedure. Doctor gave verbal consent to the procedure as well.

## 2019-03-09 NOTE — Consult Note (Addendum)
Cardiology Consultation:   Patient ID: [name] MRN: [id number]; DOB: 10-Feb-1941  Admit date: 03/08/2019 Date of Consult: 03/09/2019  Primary Care Provider: Shon Baton, MD Primary Cardiologist: Last seen in 2011 by Dr. Stanford Breed Primary Electrophysiologist:  Dr. [name]   Patient Profile:   [name] is a 78 y.o. 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 Hal Hope.  History of Present Illness:   [name] was last seen by [facility] in 2011 with Dr. Stanford Breed. He has a history of nuclear stress test in 2007 which showed a small area of apical ischemia. Echo 2009 with normal EF but diastolic dysfunction. He had an aflutter ablation prior to 2011. In 2011, he was noted to have no recurrences of flutter, but was maintained on toprol for Afib. Repeat nuc in 2010 was unchanged, no evidence of ischemia. Recent echo 2017 with normal EF and normal EF.   He presented to [facility] with abdominal distension and SOB. He wasn't due for paracentesis for another week. Paracentesis with 2 L removed in ED without improvement of his SOB. Pt takes lasix and spironolactone at baseline, recently reduced spiro dose by PCP. CXR concerning for interstitial edema and pericardial calcification with constrictive pericarditis. Also with left pleural effusion. Cardiology was consulted.   On my interview, he denies chest pain and recent stress test. His ablation was ~12 years ago. He only complains of dyspnea. He is just back from IR following left thoracentesis and states he feels much better. CXR with improved left base aeration. He has chronic LE skin changes, redness, and warmth to the touch - states he had agent orange effects on his left lower extremity treated with compression dressings.    Heart Pathway  Score:     Past Medical History:  Diagnosis Date  . Adenomatous polyps 06/2004  . Agent orange exposure   . Alcoholic cirrhosis of liver with ascites ([place])   . Alcoholism ([place])   . Allergy   . Anxiety    PTSD- no meds  . Atrial fibrillation ([place])   . Atrial flutter ([place])    had ablation   . Chronic kidney disease    "beginnings of kidney failure"- 3-4 years ago  . Depression   . Diabetes mellitus without complication (HCC)    no meds  . Diverticulosis   . GERD (gastroesophageal reflux disease)   . Hemorrhoids   . Hiatal hernia   . Hx of hernia repair   . Hyperlipidemia   . Hypertension   . Hyponatremia 08/2018  . Iron deficiency anemia   . Lung nodules    bilateral  . Pedal edema   . Pneumonia    06-2016  . PTSD (post-traumatic stress disorder)   . Substance abuse ([place])    alcohol use  . Ulcer   . Varicose veins   . Wears dentures   . Wears glasses   . Wears hearing aid    B/L    Past Surgical History:  Procedure Laterality Date  . ATRIAL ABLATION SURGERY    . COLONOSCOPY    . FUDUCIAL PLACEMENT N/A 07/26/2018   Procedure: PLACEMENT OF FUDUCIAL;  Surgeon: [name], MD;  Location: [place];  Service: Thoracic;  Laterality: N/A;  . IR PARACENTESIS  10/28/2016  . IR  PARACENTESIS  11/12/2016  . IR PARACENTESIS  12/02/2016  . IR PARACENTESIS  12/10/2016  . IR PARACENTESIS  12/30/2016  . IR PARACENTESIS  01/27/2017  . IR PARACENTESIS  09/09/2017  . IR PARACENTESIS  03/04/2018  . IR PARACENTESIS  04/08/2018  . IR PARACENTESIS  05/10/2018  . IR PARACENTESIS  05/21/2018  . IR PARACENTESIS  06/16/2018  . IR PARACENTESIS  07/23/2018  . IR PARACENTESIS  10/06/2018  . IR PARACENTESIS  10/28/2018  . IR PARACENTESIS  01/12/2019  . IR PARACENTESIS  01/28/2019  . IR PARACENTESIS  02/04/2019  . IR PARACENTESIS  02/15/2019  . IR PARACENTESIS  02/28/2019  . IR RADIOLOGIST EVAL & MGMT  06/22/2018  . POLYPECTOMY    . TONSILLECTOMY    . UMBILICAL HERNIA REPAIR    . VARICOSE VEIN  SURGERY     x4  . VIDEO BRONCHOSCOPY WITH ENDOBRONCHIAL NAVIGATION N/A 07/26/2018   Procedure: VIDEO BRONCHOSCOPY WITH ENDOBRONCHIAL NAVIGATION;  Surgeon: [name], MD;  Location: [place];  Service: Thoracic;  Laterality: N/A;     Home Medications:  Prior to Admission medications   Medication Sig Start Date End Date Taking? Authorizing Provider  furosemide (LASIX) 40 MG tablet TAKE 2 TABLETS BY MOUTH IN THE MORNING AND 1 IN THE EVENING Patient taking differently: Take 40 mg by mouth 2 (two) times daily.  02/28/19  Yes Ladene Artist, MD  Multiple Vitamin (MULTIVITAMIN WITH MINERALS) TABS tablet Take 1 tablet by mouth daily. 09/01/18  Yes Black, Lezlie Octave, NP  spironolactone (ALDACTONE) 100 MG tablet Take 1 tablet (100 mg total) by mouth daily. Patient taking differently: Take 50 mg by mouth 2 (two) times daily.  08/31/18 08/31/19 Yes Black, Lezlie Octave, NP  thiamine 100 MG tablet Take 1 tablet (100 mg total) by mouth daily. 09/01/18  Yes Black, Lezlie Octave, NP  folic acid (FOLVITE) 1 MG tablet Take 1 tablet (1 mg total) by mouth daily. Patient not taking: Reported on 03/09/2019 09/01/18   Radene Gunning, NP  metoprolol succinate (TOPROL-XL) 25 MG 24 hr tablet Take 1 tablet (25 mg total) by mouth at bedtime as needed (for HBP >130). Patient not taking: Reported on 03/09/2019 08/31/18   Radene Gunning, NP  omeprazole (PRILOSEC) 20 MG capsule Take 1 capsule (20 mg total) by mouth 2 (two) times daily. Patient not taking: Reported on 03/09/2019 03/23/13   Ladene Artist, MD    Inpatient Medications: Scheduled Meds: . folic acid  1 mg Oral Daily  . furosemide  40 mg Intravenous Daily  . multivitamin with minerals  1 tablet Oral Daily  . thiamine  100 mg Oral Daily   Or  . thiamine  100 mg Intravenous Daily   Continuous Infusions: . [START ON 03/10/2019] cefTRIAXone (ROCEPHIN)  IV     PRN Meds: acetaminophen **OR** acetaminophen, LORazepam **OR** LORazepam, ondansetron **OR** ondansetron  (ZOFRAN) IV  Allergies:    Allergies  Allergen Reactions  . Lisinopril Anaphylaxis and Other (See Comments)    Hyperkalemia, Dizziness   . Lipitor [Atorvastatin] Other (See Comments)    Marked leg fatigue  . Sulfonamide Derivatives Other (See Comments)    UNSPECIFIED REACTION  [Unsure of reaction]    Social History:   Social History   Socioeconomic History  . Marital status: Single    Spouse name: Not on file  . Number of children: 5  . Years of education: Not on file  . Highest education level: Not on file  Occupational  History  . Occupation: retired    Fish farm manager: RETIRED  Social Needs  . Financial resource strain: Not on file  . Food insecurity    Worry: Not on file    Inability: Not on file  . Transportation needs    Medical: Not on file    Non-medical: Not on file  Tobacco Use  . Smoking status: Never Smoker  . Smokeless tobacco: Never Used  Substance and Sexual Activity  . Alcohol use: Yes    Comment: occasaional beer  . Drug use: No  . Sexual activity: Not on file  Lifestyle  . Physical activity    Days per week: Not on file    Minutes per session: Not on file  . Stress: Not on file  Relationships  . Social Herbalist on phone: Not on file    Gets together: Not on file    Attends religious service: Not on file    Active member of club or organization: Not on file    Attends meetings of clubs or organizations: Not on file    Relationship status: Not on file  . Intimate partner violence    Fear of current or ex partner: Not on file    Emotionally abused: Not on file    Physically abused: Not on file    Forced sexual activity: Not on file  Other Topics Concern  . Not on file  Social History Narrative  . Not on file    Family History:    Family History  Problem Relation Age of Onset  . Colon polyps Brother   . Prostate cancer Brother   . Heart disease Father        rheumatic fever as child  . Stroke Mother   . Colon cancer Neg Hx    . Esophageal cancer Neg Hx   . Rectal cancer Neg Hx   . Stomach cancer Neg Hx      ROS:  Please see the history of present illness.   All other ROS reviewed and negative.     Physical Exam/Data:   Vitals:   03/09/19 0600 03/09/19 0700 03/09/19 0715 03/09/19 0745  BP: 123/82 116/80 118/73 113/76  Pulse: 83 91 68   Resp: (!) 22 (!) 23 (!) 25   Temp:      TempSrc:      SpO2: 97% 98% 100%   Weight:      Height:        Intake/Output Summary (Last 24 hours) at 03/09/2019 0956 Last data filed at 03/09/2019 0913 Gross per 24 hour  Intake 334 ml  Output 2275 ml  Net -1941 ml   Last 3 Weights 03/08/2019 08/31/2018 08/30/2018  Weight (lbs) 190 lb 164 lb 167 lb 15.9 oz  Weight (kg) 86.183 kg 74.39 kg 76.2 kg     Body mass index is 29.76 kg/m.  General:  Well nourished, well developed, in no acute distress HEENT: normal Neck: minimal JVD Vascular: No carotid bruits Cardiac:  Irregular rhythm, regular rate, no knock Lungs:  Respirations unlabored, productive cough following thoracentesis, lung sounds in left base  Abd: soft, nontender, no hepatomegaly  Ext: + LE edema with chronic skin changes, erythematous and warm  Musculoskeletal:  No deformities, BUE and BLE strength normal and equal Skin: warm and dry  Neuro:  CNs 2-12 intact, no focal abnormalities noted Psych:  Normal affect   EKG:  The EKG was personally reviewed and demonstrates:  Atrial fibrillation with ventricular rate  94 Telemetry:  Telemetry was personally reviewed and demonstrates:  Afib with ventricular rates in the 80s  Relevant CV Studies:  Echo pending  Laboratory Data:  High Sensitivity Troponin:  No results for input(s): TROPONINIHS in the last 720 hours.   Chemistry Recent Labs  Lab 03/08/19 2010 03/09/19 0709  NA 128* 129*  K 3.6 4.2  CL 91* 92*  CO2 25 25  GLUCOSE 120* 119*  BUN 12 11  CREATININE 0.96 0.79  CALCIUM 8.9 8.7*  GFRNONAA >60 >60  GFRAA >60 >60  ANIONGAP 12 12    Recent  Labs  Lab 03/08/19 2010 03/09/19 0709  PROT 7.6 6.6  6.7  ALBUMIN 3.3* 2.9*  2.9*  AST 25 40  41  ALT 15 13  17   ALKPHOS 151* 134*  134*  BILITOT 2.1* 3.1*  3.2*   Hematology Recent Labs  Lab 03/08/19 2010 03/09/19 0709  WBC 6.6 4.9  RBC 4.45 4.28  HGB 13.4 13.1  HCT 39.7 38.1*  MCV 89.2 89.0  MCH 30.1 30.6  MCHC 33.8 34.4  RDW 16.8* 17.1*  PLT 191 167   BNP Recent Labs  Lab 03/09/19 0034  BNP 303.9*    DDimer No results for input(s): DDIMER in the last 168 hours.   Radiology/Studies:  Dg Chest Portable 1 View  Result Date: 03/08/2019 CLINICAL DATA:  Shortness of breath EXAM: PORTABLE CHEST 1 VIEW COMPARISON:  CT 01/20/2019, radiograph 08/25/2018 FINDINGS: Bilateral pleural effusions with adjacent opacity in the lung bases likely reflecting areas of atelectasis. More patchy opacities present in the right lung base with central venous congestion and septal thickening. Calcifications are noted along the pericardium, also present on the comparison CT. Atherosclerotic calcification of the aortic arch is noted. No acute osseous or soft tissue abnormality. IMPRESSION: 1. Bilateral pleural effusions with bibasilar atelectasis. 2. Central venous congestion and septal thickening, consistent with interstitial edema. 3. More patchy opacities in the right lung base could reflect superimposed edema or atelectasis 4. Pericardial calcifications, constrictive pericarditis is not excluded. Electronically Signed   By: Lovena Le M.D.   On: 03/08/2019 20:57    Assessment and Plan:   1. Calcification seen on pericardium on CXR 2. 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 Artrial fibrillation 4. S/P atrial flutter ablation (~12 yrs ago with ?Dr. Lovena Le) - not on anticoagulation - maintained on BB - metoprolol succinate 24 mg daily - EKG in 2017- Feb 2020 sinus with first degree heart block - EKGs 08/2018 and later with atrial flutter and afib, all rate controlled - will need to consider anticoagulation in the setting of cirrhosis with hx of gastric AVMs - he is not aware of his rhythm  5. Alcoholic liver cirrhosis with ascites 6. Hyponatremia 7. Hx of gastric AVMs - Na 128 --> 129 - bedside paracentesis with 2 L removed with minimal improvement in dyspnea - AST, ALT WNL - albumin 2.9  8. Dyspnea 9. Pleural effusion  - dyspnea much improved with left thoracentesis this morning with 1.2 L fluid removed -  anticipate right thoracentesis tomorrow  For questions or updates, please contact [place] Please consult [email or web address] for contact info under   Signed, Ledora Bottcher, PA  03/09/2019 9:56 AM  The patient was seen, examined and discussed with Minette Brine , PA-C and I agree with the above.   78 y.o. male with a hx of alcoholic liver cirrhosis undergoing paracentesis every two weeks, HTN, atrial flutter s/p remote ablation on BB, hxo fo protal gastropathy with AVMs  who is being seen today for the evaluation of pericarditis. He has a history of nuclear stress test in 2007 which showed a small area of apical ischemia. Echo 2009 with normal EF but diastolic dysfunction.  The patient has been diagnosed with alcoholic cirrhosis several years ago and has been undergoing periodic paracentesis, he has had chronic hyponatremia for which he recently received salt tablets and has been retaining fluids ever since, in the last week he was unable to walk across the room, came to the ER and was found to have large bilateral pleural  effusion and large ascites.  He underwent paracentesis in the ED with removal of 2 L of fluid in the left pleural thoracenteses with removal of 1.2 L with significant improvement of his symptoms.  There is a plan for right thoracentesis tomorrow.  On physical exam patient has elevated JVDs up to his jaws, he appears comfortable, his 2 out of 6 systolic murmur, decreased breathing sounds at both lung bases, distended abdomen and 2+ pitting edema in both of his extremities that he states are normal for him for at least 40 years since he was exposed to agent orange.  His chest CT from June of this year shows significant diffuse circumferential calcification of his pericardium.  We will arrange for an echocardiogram to further evaluate for possible constriction that might be contributing to to his symptoms.  For now I would increase Lasix to 40 mg IV twice daily and add spironolactone 25 mg daily.  We will plan for left and right cardiac cath if his echo is abnormal.  Ena Dawley, MD 03/09/2019

## 2019-03-09 NOTE — ED Notes (Signed)
Admitting provider at bedside.

## 2019-03-09 NOTE — Progress Notes (Signed)
   03/09/19 2028  MEWS Score  Resp 16  Pulse Rate 100  BP 126/76  Temp 98.4 F (36.9 C)  SpO2 95 %  O2 Device Room Air  MEWS Score  MEWS RR 0  MEWS Pulse 0  MEWS Systolic 0  MEWS LOC 0  MEWS Temp 0  MEWS Score 0  MEWS Score Color Green  MEWS Assessment  Is this an acute change? No  MEWS Guidelines - (patients age 76 and over)  Red - At High Risk for Deterioration Yellow - At risk for Deterioration  1. Go to room and assess patient 2. Validate data. Is this patient's baseline? If data confirmed: 3. Is this an acute change? 4. Administer prn meds/treatments as ordered. 5. Note Sepsis score 6. Review goals of care 7. Sports coach, RRT nurse and Provider. 8. Ask Provider to come to bedside.  9. Document patient condition/interventions/response. 10. Increase frequency of vital signs and focused assessments to at least q15 minutes x 4, then q30 minutes x2. - If stable, then q1h x3, then q4h x3 and then q8h or dept. routine. - If unstable, contact Provider & RRT nurse. Prepare for possible transfer. 11. Add entry in progress notes using the smart phrase ".MEWS". 1. Go to room and assess patient 2. Validate data. Is this patient's baseline? If data confirmed: 3. Is this an acute change? 4. Administer prn meds/treatments as ordered? 5. Note Sepsis score 6. Review goals of care 7. Sports coach and Provider 8. Call RRT nurse as needed. 9. Document patient condition/interventions/response. 10. Increase frequency of vital signs and focused assessments to at least q2h x2. - If stable, then q4h x2 and then q8h or dept. routine. - If unstable, contact Provider & RRT nurse. Prepare for possible transfer. 11. Add entry in progress notes using the smart phrase ".MEWS".  Green - Likely stable [place] - Comfort Care Only  1. Continue routine/ordered monitoring.  2. Review goals of care. 1. Continue routine/ordered monitoring. 2. Review goals of care.

## 2019-03-09 NOTE — ED Notes (Addendum)
Pt provided ginger ale per his request, urinal placed at bedside

## 2019-03-09 NOTE — Progress Notes (Signed)
Patient seen and examined personally, I reviewed the chart, history and physical and admission note, done by admitting physician this morning and agree with the same with following addendum.  Please refer to the morning admission note for more detailed plan of care.  Briefly, 78 year old old male with history of alcoholic liver cirrhosis atrial flutter status post ablation rate controlled on beta-blocker, hypertension history of portal gastropathy with AVMs presented with increasing abdominal distention and acutely worsening shortness of breath.  He has been getting monthly paracentesis and is scheduled to get paracentesis every 2 weeks and is due for next week.  He denies nausea vomiting chest pain fever chills diarrhea or abdominal pain.  In the ER x-ray showed bilateral pleural effusion concerning for interstitial edema and also pericardial calcification with constrictive pericarditis.  EKG with A. fib rate controlled.  Underwent thoracentesis in the ER with 2 L fluid removal, 40 of Lasix was given and admitted for further work-up.  Last creatinine 1.9 sodium 128 alk phos 131 albumin 3.3 lipase 34 AST 25 ALT 215 BNP 303 hemoglobin 13.4 COVID-19 was negative.  When I saw the patient he was feeling somewhat better, abdomen is less distended.  Legs appear swollen.  He was on room air not in acute distress.  Acute respiratory failure with hypoxia suspect from fluid overload pleural effusion, with decompensated liver cirrhosis ascites: Continue supplemental oxygen 2 ms at least 93% or above.  Checking echocardiogram and continue IV Lasix for now.  Underwent thoracentesis.  Decompensated liver cirrhosis with ascites currently needing albuterol work-up paracentesis.  No tenderness no pain.  Got paracentesis with 2 L of fluid removal.  Underwent IR evaluation for probable paracentesis but minimal ascites on ultrasound paracentesis deferred but,Noted to have left-sided pleural effusion  Pleural effusion  likely due to patient's liver cirrhosis.  1.2 L fluid removed from lt. Follow-up pleural fluid analysis.  cont diuretics Hyponatremia: Reports he is on salt tab and recently cut down due to fluid overload.Sodium stable monitor. Alcohol abuse continue on CIWA scale. History of atrial fibrillation/flutter status post ablation rate controlled.  Cardiology consulted given concern for Constrictive pericarditis, resume beta-blocker.

## 2019-03-09 NOTE — Progress Notes (Signed)
  Echocardiogram 2D Echocardiogram has been performed.  [name] 03/09/2019, 3:08 PM

## 2019-03-10 ENCOUNTER — Inpatient Hospital Stay (HOSPITAL_COMMUNITY): Payer: Medicare HMO

## 2019-03-10 ENCOUNTER — Encounter (HOSPITAL_COMMUNITY): Payer: Self-pay | Admitting: Radiology

## 2019-03-10 HISTORY — PX: IR THORACENTESIS ASP PLEURAL SPACE W/IMG GUIDE: IMG5380

## 2019-03-10 LAB — CBC
HCT: 36 % — ABNORMAL LOW (ref 39.0–52.0)
Hemoglobin: 12.3 g/dL — ABNORMAL LOW (ref 13.0–17.0)
MCH: 29.9 pg (ref 26.0–34.0)
MCHC: 34.2 g/dL (ref 30.0–36.0)
MCV: 87.6 fL (ref 80.0–100.0)
Platelets: 191 10*3/uL (ref 150–400)
RBC: 4.11 MIL/uL — ABNORMAL LOW (ref 4.22–5.81)
RDW: 16.6 % — ABNORMAL HIGH (ref 11.5–15.5)
WBC: 5.7 10*3/uL (ref 4.0–10.5)
nRBC: 0 % (ref 0.0–0.2)

## 2019-03-10 LAB — COMPREHENSIVE METABOLIC PANEL
ALT: 13 U/L (ref 0–44)
AST: 20 U/L (ref 15–41)
Albumin: 2.6 g/dL — ABNORMAL LOW (ref 3.5–5.0)
Alkaline Phosphatase: 126 U/L (ref 38–126)
Anion gap: 11 (ref 5–15)
BUN: 11 mg/dL (ref 8–23)
CO2: 25 mmol/L (ref 22–32)
Calcium: 8.5 mg/dL — ABNORMAL LOW (ref 8.9–10.3)
Chloride: 90 mmol/L — ABNORMAL LOW (ref 98–111)
Creatinine, Ser: 0.77 mg/dL (ref 0.61–1.24)
GFR calc Af Amer: >60 mL/min (ref >60)
GFR calc non Af Amer: >60 mL/min (ref >60)
Glucose, Bld: 120 mg/dL — ABNORMAL HIGH (ref 70–99)
Potassium: 3.7 mmol/L (ref 3.5–5.1)
Sodium: 126 mmol/L — ABNORMAL LOW (ref 135–145)
Total Bilirubin: 3 mg/dL — ABNORMAL HIGH (ref 0.3–1.2)
Total Protein: 6.1 g/dL — ABNORMAL LOW (ref 6.5–8.1)

## 2019-03-10 LAB — CYTOLOGY - NON PAP

## 2019-03-10 MED ORDER — LIDOCAINE HCL 1 % IJ SOLN
INTRAMUSCULAR | Status: DC | PRN
  Administered 2019-03-10: 10 mL

## 2019-03-10 MED ORDER — LIDOCAINE HCL 1 % IJ SOLN
INTRAMUSCULAR | Status: AC
  Filled 2019-03-10: qty 20

## 2019-03-10 MED ORDER — LINACLOTIDE 145 MCG PO CAPS
145.0000 ug | ORAL_CAPSULE | Freq: Every day | ORAL | Status: DC
  Filled 2019-03-10: qty 1

## 2019-03-10 MED ORDER — SODIUM CHLORIDE 0.9 % IV SOLN
INTRAVENOUS | Status: DC | PRN
  Administered 2019-03-10: 5 mL via INTRAVENOUS

## 2019-03-10 NOTE — Progress Notes (Signed)
   03/10/19 2123  MEWS Score  Resp 18  Pulse Rate 100  BP 128/85  Temp 98.2 F (36.8 C)  SpO2 94 %  O2 Device Room Air  MEWS Score  MEWS RR 0  MEWS Pulse 0  MEWS Systolic 0  MEWS LOC 0  MEWS Temp 0  MEWS Score 0  MEWS Score Color Green  MEWS Assessment  Is this an acute change? No  MEWS Guidelines - (patients age 76 and over)  Red - At High Risk for Deterioration Yellow - At risk for Deterioration  1. Go to room and assess patient 2. Validate data. Is this patient's baseline? If data confirmed: 3. Is this an acute change? 4. Administer prn meds/treatments as ordered. 5. Note Sepsis score 6. Review goals of care 7. Sports coach, RRT nurse and Provider. 8. Ask Provider to come to bedside.  9. Document patient condition/interventions/response. 10. Increase frequency of vital signs and focused assessments to at least q15 minutes x 4, then q30 minutes x2. - If stable, then q1h x3, then q4h x3 and then q8h or dept. routine. - If unstable, contact Provider & RRT nurse. Prepare for possible transfer. 11. Add entry in progress notes using the smart phrase ".MEWS". 1. Go to room and assess patient 2. Validate data. Is this patient's baseline? If data confirmed: 3. Is this an acute change? 4. Administer prn meds/treatments as ordered? 5. Note Sepsis score 6. Review goals of care 7. Sports coach and Provider 8. Call RRT nurse as needed. 9. Document patient condition/interventions/response. 10. Increase frequency of vital signs and focused assessments to at least q2h x2. - If stable, then q4h x2 and then q8h or dept. routine. - If unstable, contact Provider & RRT nurse. Prepare for possible transfer. 11. Add entry in progress notes using the smart phrase ".MEWS".  Green - Likely stable [place] - Comfort Care Only  1. Continue routine/ordered monitoring.  2. Review goals of care. 1. Continue routine/ordered monitoring. 2. Review goals of care.

## 2019-03-10 NOTE — Evaluation (Signed)
Physical Therapy Evaluation Patient Details Name: [name] MRN: [id number] DOB: 1941/02/22 Today's Date: 03/10/2019   History of Present Illness  [name] is a 78 y.o. male with history of alcoholic liver cirrhosis, atrial flutter status post ablation presently rate controlled on beta-blockers, hypertension, history of portal gastropathy with AVMs presents to the ER because of increasing abdominal distention and acute worsening of shortness of breath. X-ray showed bilateral pleural effusion with features concerning for interstitial edema and also pericardial calcification with constrictive pericarditis.  Clinical Impression  Pt admitted with above diagnosis. Prior to admission, pt lives alone, uses a Rollator intermittently and has history of falls. On PT evaluation, pt ambulating 120 feet with walker and min guard assist, SpO2 90% on RA post mobility. Presents with decreased endurance, weakness, balance impairments. Recommend use of walker with all mobility. Would benefit from HHPT to maximize functional independence.     Follow Up Recommendations Home health PT;Supervision for mobility/OOB    Equipment Recommendations  None recommended by PT    Recommendations for Other Services       Precautions / Restrictions Precautions Precautions: Fall Restrictions Weight Bearing Restrictions: No      Mobility  Bed Mobility Overal bed mobility: Modified Independent                Transfers Overall transfer level: Needs assistance Equipment used: Rolling walker (2 wheeled) Transfers: Sit to/from Stand Sit to Stand: Min guard            Ambulation/Gait Ambulation/Gait assistance: Min guard Gait Distance (Feet): 120 Feet Assistive device: Rolling walker (2 wheeled) Gait Pattern/deviations: Step-through pattern;Wide base of support     General Gait Details: Utilizing wide BOS, min guard for safety. Needs cueing for walker proximity, staying inside of the walker during  turns  Stairs            Wheelchair Mobility    Modified [name] (Stroke Patients Only)       Balance Overall balance assessment: Needs assistance Sitting-balance support: Feet supported Sitting balance-Leahy Scale: Good     Standing balance support: Bilateral upper extremity supported Standing balance-Leahy Scale: Fair                               Pertinent Vitals/Pain Pain Assessment: Faces Faces Pain Scale: No hurt    Home Living Family/patient expects to be discharged to:: Private residence Living Arrangements: Alone Available Help at Discharge: Family;Available PRN/intermittently Type of Home: House Home Access: Stairs to enter Entrance Stairs-Rails: Psychiatric nurse of Steps: 3 Home Layout: One level Home Equipment: Environmental consultant - 4 wheels      Prior Function Level of Independence: Independent with assistive device(s)         Comments: history of falls, drives, uses Rollator intermittently     Hand Dominance        Extremity/Trunk Assessment   Upper Extremity Assessment Upper Extremity Assessment: Defer to OT evaluation    Lower Extremity Assessment Lower Extremity Assessment: Generalized weakness       Communication   Communication: HOH  Cognition Arousal/Alertness: Awake/alert Behavior During Therapy: Impulsive Overall Cognitive Status: Impaired/Different from baseline Area of Impairment: Safety/judgement                         Safety/Judgement: Decreased awareness of safety;Decreased awareness of deficits     General Comments: Difficult to assess due to [facility], somewhat impulsive,  decreased awareness of safety/deficits noted      General Comments      Exercises     Assessment/Plan    PT Assessment Patient needs continued PT services  PT Problem List Decreased strength;Decreased activity tolerance;Decreased balance;Decreased mobility;Decreased safety awareness;Cardiopulmonary status  limiting activity       PT Treatment Interventions DME instruction;Gait training;Stair training;Functional mobility training;Therapeutic activities;Therapeutic exercise;Balance training;Patient/family education    PT Goals (Current goals can be found in the Care Plan section)  Acute Rehab PT Goals Patient Stated Goal: "get this fluid off my lung." PT Goal Formulation: With patient Time For Goal Achievement: 03/24/19 Potential to Achieve Goals: Good    Frequency Min 3X/week   Barriers to discharge Decreased caregiver support      Co-evaluation               AM-PAC PT "6 Clicks" Mobility  Outcome Measure Help needed turning from your back to your side while in a flat bed without using bedrails?: None Help needed moving from lying on your back to sitting on the side of a flat bed without using bedrails?: None Help needed moving to and from a bed to a chair (including a wheelchair)?: A Little Help needed standing up from a chair using your arms (e.g., wheelchair or bedside chair)?: A Little Help needed to walk in hospital room?: A Little Help needed climbing 3-5 steps with a railing? : A Lot 6 Click Score: 19    End of Session   Activity Tolerance: Patient tolerated treatment well Patient left: in chair;with call bell/phone within reach;with chair alarm set Nurse Communication: Mobility status PT Visit Diagnosis: Unsteadiness on feet (R26.81);History of falling (Z91.81);Difficulty in walking, not elsewhere classified (R26.2)    Time: 5859-2924 PT Time Calculation (min) (ACUTE ONLY): 21 min   Charges:   PT Evaluation $PT Eval Moderate Complexity: 1 Mod          Ellamae Sia, [place], DPT Acute Rehabilitation Services Pager [phone] Office [phone]   Willy Eddy 03/10/2019, 9:37 AM

## 2019-03-10 NOTE — Progress Notes (Signed)
PROGRESS NOTE    [name]  DSK:[id number] DOB: 10-Sep-1940 DOA: 03/08/2019 PCP: Shon Baton, MD   Brief Narrative:  78 year old old male with history of alcoholic liver cirrhosis atrial flutter status post ablation rate controlled on beta-blocker, hypertension history of portal gastropathy with AVMs presented with increasing abdominal distention and acutely worsening shortness of breath.  He has been getting monthly paracentesis and is scheduled to get paracentesis every 2 weeks and is due for next week.  He denies nausea vomiting chest pain fever chills diarrhea or abdominal pain.  In the ER x-ray showed bilateral pleural effusion concerning for interstitial edema and also pericardial calcification with constrictive pericarditis.  EKG with A. fib rate controlled.  Underwent thoracentesis in the ER with 2 L fluid removal, 40 of Lasix was given and admitted for further work-up.  Last creatinine 1.9 sodium 128 alk phos 131 albumin 3.3 lipase 34 AST 25 ALT 215 BNP 303 hemoglobin 13.4 COVID-19 was negative. Underwent ultrasound for paracentesis but minimal ascites noted to have left worse than right pleural effusion underwent 1.2 L fluid removal from the left thoracentesis.  Subjective: Seen this morning on the bedside chair on room air.  Feels shortness of breath is much improved.  Has chronic appearing leg edema which is also improving.  Assessment & Plan:   Acute respiratory failure with hypoxia: suspect from fluid overload pleural effusion, with decompensated liver cirrhosis ascites:  Off oxygen.  Status post ascitic tap and left thoracentesis and going for right thoracentesis today.    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  Await further recommendation from cardiology.  Decompensated liver cirrhosis with ascites currently  On 2 weekly paracentesis recently- he  Report he was on monthly schedule. No tenderness no pain. s/p paracentesis with 2 L of fluid  removal.  Now minimal ascites on ultrasound paracentesis deferred but,Noted to have left-sided pleural effusion.  Patient placed on empiric ceftriaxone- currently no fever no leukocytosis no suspicion for SBP and will discontinue. aldacton on hold.  Bilateral pleural effusion likely due to patient's liver cirrhosis.  1.2 L fluid removed from lt and also had rt thoracentesis today and 1 litre clear yellow fluid removed. Pleural fluid appears transudative with low protein, gram stain neg.Cont diuretics.  Hyponatremia: Reports he is on salt tab and recently cut down due to fluid overload.Sodium ranges form low 100s to above.  Recent Labs  Lab 03/08/19 2010 03/09/19 0709 03/10/19 0252  NA 128* 129* 126*   Alcohol abuse continue on CIWA scale.  No signs of withdrawal cessation advised.  Continue thiamine.  History of atrial fibrillation/flutter status post ablation rate controlled.  Cardiology consulted given concern for Constrictive pericarditis, continue beta-blocker.  Not on anticoagulation  Body mass index is 29.76 kg/m.  DVT prophylaxis: SCD Code Status: FULL Family Communication:Plan of care discussed with patient in detail. Disposition Plan: Remains inpatient pending clinical improvement and further eval by cardiology.  Consultants:cardiology   Procedures:  Abdominal paracentesis 20/21 Left thoracentesis 20/21 Right thoracentesis 10/22  Microbiology: Pleural fluid pending  Antimicrobials: Anti-infectives (From admission, onward)   Start     Dose/Rate Route Frequency Ordered Stop   03/10/19 0600  cefTRIAXone (ROCEPHIN) 1 g in sodium chloride 0.9 % 100 mL IVPB  Status:  Discontinued     1 g 200 mL/hr over 30 Minutes Intravenous Every 24 hours 03/09/19 0619 03/09/19 0627   03/10/19 0600  cefTRIAXone (ROCEPHIN) 2 g in sodium chloride 0.9 % 100 mL IVPB  2 g 200 mL/hr over 30 Minutes Intravenous Every 24 hours 03/09/19 0627     03/09/19 0630  cefTRIAXone (ROCEPHIN) 2 g in  sodium chloride 0.9 % 100 mL IVPB     2 g 200 mL/hr over 30 Minutes Intravenous  Once 03/09/19 0625 03/09/19 0913       Objective: Vitals:   03/10/19 0754 03/10/19 1148 03/10/19 1155 03/10/19 1202  BP: 118/71 (!) 140/91 140/88 112/76  Pulse: 89     Resp: 18     Temp: 98.1 F (36.7 C)     TempSrc: Oral     SpO2: 99%     Weight:      Height:        Intake/Output Summary (Last 24 hours) at 03/10/2019 1417 Last data filed at 03/10/2019 0547 Gross per 24 hour  Intake 24.91 ml  Output 500 ml  Net -475.09 ml   Filed Weights   03/08/19 2001  Weight: 86.2 kg   Weight change:   Body mass index is 29.76 kg/m.  Intake/Output from previous day: 10/21 0701 - 10/22 0700 In: 124.9 [I.V.:0.3; IV Piggyback:124.7] Out: 2400 [Urine:1200] Intake/Output this shift: No intake/output data recorded.  Examination:  General exam: Appears calm and comfortable, older than stated age  NAD' HEENT:PERRL,Oral mucosa moist, Ear/Nose normal on gross exam Respiratory system: Diminished air sounds on the right lung base , normal vesicular breath sounds, no wheezes or crackles  Cardiovascular system: S1 & S2 heard,No JVD, murmurs. Gastrointestinal system: Abdomen is  soft, mildly distended nontender, BS +  Nervous System:Alert and oriented. No focal neurological deficits/moving extremities, sensation intact. Extremities:Chronic appearing LEG  Edema with erythema, no clubbing, distal peripheral pulses palpable. Skin: No rashes, lesions, no icterus MSK: Normal muscle bulk,tone ,power  Medications:  Scheduled Meds:  folic acid  1 mg Oral Daily   furosemide  40 mg Intravenous Daily   lidocaine       Melatonin  3 mg Oral QHS   multivitamin with minerals  1 tablet Oral Daily   thiamine  100 mg Oral Daily   Or   thiamine  100 mg Intravenous Daily   Continuous Infusions:  sodium chloride 5 mL (03/10/19 0538)   cefTRIAXone (ROCEPHIN)  IV 2 g (03/10/19 0539)    Data Reviewed: I have  personally reviewed following labs and imaging studies  CBC: Recent Labs  Lab 03/08/19 2010 03/09/19 0709 03/10/19 0252  WBC 6.6 4.9 5.7  NEUTROABS  --  3.8  --   HGB 13.4 13.1 12.3*  HCT 39.7 38.1* 36.0*  MCV 89.2 89.0 87.6  PLT 191 167 124   Basic Metabolic Panel: Recent Labs  Lab 03/08/19 2010 03/09/19 0709 03/10/19 0252  NA 128* 129* 126*  K 3.6 4.2 3.7  CL 91* 92* 90*  CO2 '25 25 25  '$ GLUCOSE 120* 119* 120*  BUN '12 11 11  '$ CREATININE 0.96 0.79 0.77  CALCIUM 8.9 8.7* 8.5*  MG  --  1.9  --   PHOS  --  3.3  --    GFR: Estimated Creatinine Clearance: 83.6 mL/min (by C-G formula based on SCr of 0.77 mg/dL). Liver Function Tests: Recent Labs  Lab 03/08/19 2010 03/09/19 0709 03/10/19 0252  AST 25 40   41 20  ALT '15 13   17 13  '$ ALKPHOS 151* 134*   134* 126  BILITOT 2.1* 3.1*   3.2* 3.0*  PROT 7.6 6.6   6.7 6.1*  ALBUMIN 3.3* 2.9*   2.9* 2.6*  Recent Labs  Lab 03/08/19 2010  LIPASE 34   No results for input(s): AMMONIA in the last 168 hours. Coagulation Profile: Recent Labs  Lab 03/09/19 0907  INR 1.3*   Cardiac Enzymes: No results for input(s): CKTOTAL, CKMB, CKMBINDEX, TROPONINI in the last 168 hours. BNP (last 3 results) No results for input(s): PROBNP in the last 8760 hours. HbA1C: No results for input(s): HGBA1C in the last 72 hours. CBG: Recent Labs  Lab 03/09/19 0247  GLUCAP 115*   Lipid Profile: No results for input(s): CHOL, HDL, LDLCALC, TRIG, CHOLHDL, LDLDIRECT in the last 72 hours. Thyroid Function Tests: No results for input(s): TSH, T4TOTAL, FREET4, T3FREE, THYROIDAB in the last 72 hours. Anemia Panel: No results for input(s): VITAMINB12, FOLATE, FERRITIN, TIBC, IRON, RETICCTPCT in the last 72 hours. Sepsis Labs: No results for input(s): PROCALCITON, LATICACIDVEN in the last 168 hours.  Recent Results (from the past 240 hour(s))  SARS CORONAVIRUS 2 (TAT 6-24 HRS) Nasopharyngeal Nasopharyngeal Swab     Status: None   Collection  Time: 03/09/19 12:34 AM   Specimen: Nasopharyngeal Swab  Result Value Ref Range Status   SARS Coronavirus 2 NEGATIVE NEGATIVE Final    Comment: (NOTE) SARS-CoV-2 target nucleic acids are NOT DETECTED. The SARS-CoV-2 RNA is generally detectable in upper and lower respiratory specimens during the acute phase of infection. Negative results do not preclude SARS-CoV-2 infection, do not rule out co-infections with other pathogens, and should not be used as the sole basis for treatment or other patient management decisions. Negative results must be combined with clinical observations, patient history, and epidemiological information. The expected result is Negative. Fact Sheet for Patients: SugarRoll.be Fact Sheet for Healthcare Providers: https://www.woods-mathews.com/ This test is not yet approved or cleared by the [place] FDA and  has been authorized for detection and/or diagnosis of SARS-CoV-2 by FDA under an Emergency Use Authorization (EUA). This EUA will remain  in effect (meaning this test can be used) for the duration of the COVID-19 declaration under Section 56 4(b)(1) of the Act, 21 U.S.C. section 360bbb-3(b)(1), unless the authorization is terminated or revoked sooner. Performed at [facility], [place] [address], [place], [place] [address]   Gram stain     Status: None   Collection Time: 03/09/19 10:49 AM   Specimen: Lung, Left; Pleural Fluid  Result Value Ref Range Status   Specimen Description PLEURAL LEFT  Final   Special Requests NONE  Final   Gram Stain   Final    RARE WBC PRESENT, PREDOMINANTLY MONONUCLEAR NO ORGANISMS SEEN Performed at [facility], [place] [address], [place], [place] [address]    Report Status 03/09/2019 FINAL  Final      Radiology Studies: Dg Chest 1 View  Result Date: 03/10/2019 CLINICAL DATA:  Status post thoracentesis. EXAM: CHEST  1 VIEW COMPARISON:  03/09/2019 FINDINGS: Interval  evacuation of the right pleural fluid collection. No postprocedural pneumothorax is identified. There is a persistent small left effusion and left basilar atelectasis. IMPRESSION: Evacuation of right pleural fluid collection without postprocedural pneumothorax. Persistent small left effusion and left basilar atelectasis. Electronically Signed   By: Marijo Sanes M.D.   On: 03/10/2019 12:42   Dg Chest 1 View  Result Date: 03/09/2019 CLINICAL DATA:  78 year old male status post left side ultrasound-guided thoracentesis this morning. EXAM: CHEST  1 VIEW COMPARISON:  03/08/2019 portable chest and earlier. FINDINGS: AP upright view at 1038 hours. Regressed left-side pleural effusion with improved left lung base ventilation. No  pneumothorax. Stable right pleural effusion. Stable cardiac size and mediastinal contours. No areas of worsening ventilation. IMPRESSION: 1. Regressed left pleural effusion and improved ventilation at the left lung base. No pneumothorax. 2. Stable right pleural effusion. Electronically Signed   By: Genevie Ann M.D.   On: 03/09/2019 10:46   Dg Chest Portable 1 View  Result Date: 03/08/2019 CLINICAL DATA:  Shortness of breath EXAM: PORTABLE CHEST 1 VIEW COMPARISON:  CT 01/20/2019, radiograph 08/25/2018 FINDINGS: Bilateral pleural effusions with adjacent opacity in the lung bases likely reflecting areas of atelectasis. More patchy opacities present in the right lung base with central venous congestion and septal thickening. Calcifications are noted along the pericardium, also present on the comparison CT. Atherosclerotic calcification of the aortic arch is noted. No acute osseous or soft tissue abnormality. IMPRESSION: 1. Bilateral pleural effusions with bibasilar atelectasis. 2. Central venous congestion and septal thickening, consistent with interstitial edema. 3. More patchy opacities in the right lung base could reflect superimposed edema or atelectasis 4. Pericardial calcifications,  constrictive pericarditis is not excluded. Electronically Signed   By: Lovena Le M.D.   On: 03/08/2019 20:57   Ir Abdomen US Limited  Result Date: 03/09/2019 CLINICAL DATA:  Cirrhosis, abdominal discomfort, history of recurrent large volume abdominal ascites. EXAM: LIMITED ABDOMEN ULTRASOUND FOR ASCITES TECHNIQUE: Limited ultrasound survey for ascites was performed in all four abdominal quadrants. COMPARISON:  02/28/2019 and previous FINDINGS: Survey ultrasound shows minimal abdominal ascites. IMPRESSION: Minimal ascites.  Paracentesis deferred. Electronically Signed   By: Lucrezia Europe M.D.   On: 03/09/2019 10:29   Ir Thoracentesis Asp Pleural Space W/img Guide  Result Date: 03/10/2019 INDICATION: Shortness of breath. History of underlying alcoholic cirrhosis. Right-sided pleural effusion. Request for therapeutic thoracentesis. EXAM: ULTRASOUND GUIDED RIGHT THORACENTESIS MEDICATIONS: None. COMPLICATIONS: None immediate. PROCEDURE: An ultrasound guided thoracentesis was thoroughly discussed with the patient and questions answered. The benefits, risks, alternatives and complications were also discussed. The patient understands and wishes to proceed with the procedure. Written consent was obtained. Ultrasound was performed to localize and mark an adequate pocket of fluid in the right chest. The area was then prepped and draped in the normal sterile fashion. 1% Lidocaine was used for local anesthesia. Under ultrasound guidance a 6 Fr Safe-T-Centesis catheter was introduced. Thoracentesis was performed. The catheter was removed and a dressing applied. FINDINGS: A total of approximately 1 L of clear yellow fluid was removed. IMPRESSION: Successful ultrasound guided right thoracentesis yielding 1 L of pleural fluid. Read by: Ascencion Dike PA-C Electronically Signed   By: Jerilynn Mages.  Shick M.D.   On: 03/10/2019 12:12   Ir Thoracentesis Asp Pleural Space W/img Guide  Result Date: 16/05/930 INDICATION: Folic  cirrhosis. Left pleural effusion. Request for diagnostic and therapeutic thoracentesis. EXAM: ULTRASOUND GUIDED LEFT THORACENTESIS MEDICATIONS: 1% lidocaine 10 mL COMPLICATIONS: None immediate. PROCEDURE: An ultrasound guided thoracentesis was thoroughly discussed with the patient and questions answered. The benefits, risks, alternatives and complications were also discussed. The patient understands and wishes to proceed with the procedure. Written consent was obtained. Ultrasound was performed to localize and mark an adequate pocket of fluid in the left chest. The area was then prepped and draped in the normal sterile fashion. 1% Lidocaine was used for local anesthesia. Under ultrasound guidance a 6 Fr Safe-T-Centesis catheter was introduced. Thoracentesis was performed. The catheter was removed and a dressing applied. FINDINGS: A total of approximately 1.2 L of hazy yellow fluid was removed. Samples were sent to the laboratory as requested by the  clinical team. IMPRESSION: Successful ultrasound guided left thoracentesis yielding 1.2 L of pleural fluid. No pneumothorax on post-procedure chest x-ray. Read by: Gareth Eagle, PA-C Electronically Signed   By: Jacqulynn Cadet M.D.   On: 03/09/2019 11:23      LOS: 1 day   Time spent: More than 50% of that time was spent in counseling and/or coordination of care.  Antonieta Pert, MD [facility]  03/10/2019, 2:17 PM

## 2019-03-10 NOTE — Evaluation (Signed)
Occupational Therapy Evaluation Patient Details Name: [name] MRN: [id number] DOB: Sep 24, 1940 Today's Date: 03/10/2019    History of Present Illness [name] is a 78 y.o. male with history of alcoholic liver cirrhosis, atrial flutter status post ablation presently rate controlled on beta-blockers, hypertension, history of portal gastropathy with AVMs presents to the ER because of increasing abdominal distention and acute worsening of shortness of breath. X-ray showed bilateral pleural effusion with features concerning for interstitial edema and also pericardial calcification with constrictive pericarditis.   Clinical Impression   Pt PTA: living alone and independent. "My son lives 15 mins walk from me." Pt reports independence for ADL and mobility. Pt currently performing ADL functional mobility with RW and supervisionA as pt constantly using furniture instead of RW. Pt performing ADL tasks with supervisionA to modified independence. Pt required no standing rest breaks when standing at sink. Energy conservation strategy education complete. Pt 90% on RA with exertion. Pt does not require continued acute OT skilled services. OT signing off.     Follow Up Recommendations  Home health OT    Equipment Recommendations  None recommended by OT    Recommendations for Other Services       Precautions / Restrictions Precautions Precautions: Fall Restrictions Weight Bearing Restrictions: No      Mobility Bed Mobility Overal bed mobility: Modified Independent                Transfers Overall transfer level: Needs assistance Equipment used: Rolling walker (2 wheeled) Transfers: Sit to/from Stand Sit to Stand: Min guard         General transfer comment: unsteady with initial standing balance    Balance Overall balance assessment: Needs assistance Sitting-balance support: Feet supported Sitting balance-Leahy Scale: Good     Standing balance support: Bilateral upper  extremity supported Standing balance-Leahy Scale: Fair                             ADL either performed or assessed with clinical judgement   ADL Overall ADL's : At baseline                                       General ADL Comments: Pt takes increased time and requires verbal cueing to use RW for mobility.     Vision Baseline Vision/History: No visual deficits Vision Assessment?: No apparent visual deficits     Perception     Praxis      Pertinent Vitals/Pain Faces Pain Scale: No hurt     Hand Dominance Right   Extremity/Trunk Assessment Upper Extremity Assessment Upper Extremity Assessment: Generalized weakness   Lower Extremity Assessment Lower Extremity Assessment: Generalized weakness   Cervical / Trunk Assessment Cervical / Trunk Assessment: Normal   Communication Communication Communication: HOH   Cognition Arousal/Alertness: Awake/alert Behavior During Therapy: Impulsive Overall Cognitive Status: Impaired/Different from baseline Area of Impairment: Safety/judgement                         Safety/Judgement: Decreased awareness of safety;Decreased awareness of deficits     General Comments: Difficult to assess due to [facility], somewhat impulsive, decreased awareness of safety/deficits noted   General Comments  Pt requiring constant cueing to continue use of RW for safe mobility.    Exercises     Shoulder Instructions  Home Living Family/patient expects to be discharged to:: Private residence Living Arrangements: Alone Available Help at Discharge: Family;Available PRN/intermittently Type of Home: House Home Access: Stairs to enter [employer] of Steps: 3 Entrance Stairs-Rails: Right;Left Home Layout: One level     Bathroom Shower/Tub: Tub/shower unit         Home Equipment: Environmental consultant - 4 wheels          Prior Functioning/Environment Level of Independence: Independent with assistive  device(s)        Comments: history of falls, drives, uses Rollator intermittently        OT Problem List: Decreased activity tolerance      OT Treatment/Interventions:      OT Goals(Current goals can be found in the care plan section) Acute Rehab OT Goals Patient Stated Goal: "get this fluid off my lung." OT Goal Formulation: With patient  OT Frequency:     Barriers to D/C:            Co-evaluation              AM-PAC OT "6 Clicks" Daily Activity     Outcome Measure Help from another person eating meals?: None Help from another person taking care of personal grooming?: None Help from another person toileting, which includes using toliet, bedpan, or urinal?: None Help from another person bathing (including washing, rinsing, drying)?: A Little Help from another person to put on and taking off regular upper body clothing?: None Help from another person to put on and taking off regular lower body clothing?: None 6 Click Score: 23   End of Session Equipment Utilized During Treatment: Gait belt;Rolling walker Nurse Communication: Mobility status  Activity Tolerance: Patient tolerated treatment well Patient left: in bed;with bed alarm set;with call bell/phone within reach  OT Visit Diagnosis: Unsteadiness on feet (R26.81);Muscle weakness (generalized) (M62.81)                Time: 0930-1000 OT Time Calculation (min): 30 min Charges:  OT General Charges $OT Visit: 1 Visit OT Evaluation $OT Eval Moderate Complexity: 1 Mod OT Treatments $Self Care/Home Management : 8-22 mins  Ebony Hail Harold Hedge) Marsa Aris OTR/L Acute Rehabilitation Services Pager: [phone] Office: [place] 03/10/2019, 4:11 PM

## 2019-03-10 NOTE — Plan of Care (Signed)

## 2019-03-10 NOTE — Procedures (Signed)
PROCEDURE SUMMARY:  Successful US guided right thoracentesis. Yielded 1 L of clear yellow fluid. Pt tolerated procedure well. No immediate complications.  Specimen was not sent for labs. CXR ordered.  EBL < 5 mL  Ascencion Dike PA-C 03/10/2019 12:00 PM

## 2019-03-11 DIAGNOSIS — I4811 Longstanding persistent atrial fibrillation: Secondary | ICD-10-CM

## 2019-03-11 DIAGNOSIS — I311 Chronic constrictive pericarditis: Secondary | ICD-10-CM

## 2019-03-11 LAB — COMPREHENSIVE METABOLIC PANEL
ALT: 12 U/L (ref 0–44)
AST: 21 U/L (ref 15–41)
Albumin: 2.4 g/dL — ABNORMAL LOW (ref 3.5–5.0)
Alkaline Phosphatase: 120 U/L (ref 38–126)
Anion gap: 10 (ref 5–15)
BUN: 11 mg/dL (ref 8–23)
CO2: 26 mmol/L (ref 22–32)
Calcium: 8.4 mg/dL — ABNORMAL LOW (ref 8.9–10.3)
Chloride: 88 mmol/L — ABNORMAL LOW (ref 98–111)
Creatinine, Ser: 0.67 mg/dL (ref 0.61–1.24)
GFR calc Af Amer: >60 mL/min (ref >60)
GFR calc non Af Amer: >60 mL/min (ref >60)
Glucose, Bld: 120 mg/dL — ABNORMAL HIGH (ref 70–99)
Potassium: 3.4 mmol/L — ABNORMAL LOW (ref 3.5–5.1)
Sodium: 124 mmol/L — ABNORMAL LOW (ref 135–145)
Total Bilirubin: 2.6 mg/dL — ABNORMAL HIGH (ref 0.3–1.2)
Total Protein: 5.9 g/dL — ABNORMAL LOW (ref 6.5–8.1)

## 2019-03-11 LAB — CBC
HCT: 33.6 % — ABNORMAL LOW (ref 39.0–52.0)
Hemoglobin: 11.7 g/dL — ABNORMAL LOW (ref 13.0–17.0)
MCH: 29.9 pg (ref 26.0–34.0)
MCHC: 34.8 g/dL (ref 30.0–36.0)
MCV: 85.9 fL (ref 80.0–100.0)
Platelets: 169 10*3/uL (ref 150–400)
RBC: 3.91 MIL/uL — ABNORMAL LOW (ref 4.22–5.81)
RDW: 16.3 % — ABNORMAL HIGH (ref 11.5–15.5)
WBC: 4.3 10*3/uL (ref 4.0–10.5)
nRBC: 0 % (ref 0.0–0.2)

## 2019-03-11 MED ORDER — MENTHOL 3 MG MT LOZG
1.0000 | LOZENGE | OROMUCOSAL | Status: DC | PRN
  Administered 2019-03-11: 3 mg via ORAL
  Filled 2019-03-11: qty 9

## 2019-03-11 MED ORDER — SPIRONOLACTONE 25 MG PO TABS
25.0000 mg | ORAL_TABLET | Freq: Every day | ORAL | Status: DC
  Administered 2019-03-11: 25 mg via ORAL
  Filled 2019-03-11: qty 1

## 2019-03-11 MED ORDER — SPIRONOLACTONE 25 MG PO TABS: 25.0000 mg | ORAL_TABLET | Freq: Every day | ORAL | 0 refills | Status: DC

## 2019-03-11 MED ORDER — POTASSIUM CHLORIDE CRYS ER 20 MEQ PO TBCR
40.0000 meq | EXTENDED_RELEASE_TABLET | Freq: Once | ORAL | Status: AC
  Administered 2019-03-11: 40 meq via ORAL
  Filled 2019-03-11: qty 2

## 2019-03-11 MED ORDER — FUROSEMIDE 40 MG PO TABS: 40.0000 mg | ORAL_TABLET | Freq: Two times a day (BID) | ORAL | Status: DC

## 2019-03-11 NOTE — Progress Notes (Signed)
Patient discharge teaching given, including activity, diet, follow-up appoints, and medications. Patient verbalized understanding of all discharge instructions. IV access was d/c'd. Vitals are stable. Skin is intact except as charted in most recent assessments. Pt to be escorted out by NT, to be driven home by family. 

## 2019-03-11 NOTE — Discharge Summary (Signed)
Physician Discharge Summary  [name]:[id number] DOB: 1941/04/17 DOA: 03/08/2019  PCP: Shon Baton, MD  Admit date: 03/08/2019 Discharge date: 03/11/2019  Admitted From: home Disposition:  home  Recommendations for Outpatient Follow-up:  1. Follow up with PCP and cardiuology in 1-2 weeks 2. Please obtain BMP/CBC in one week 3. Please follow up on the following pending results:  Home Health:no  Equipment/Devices: RW  Discharge Condition: Stable CODE STATUS: FULL Diet recommendation: Heart Healthy  Brief/Interim Summary:  78 year old old male with history of alcoholic liver cirrhosis atrial flutter status post ablation rate controlled on beta-blocker, hypertension history of portal gastropathy with AVMs presented with increasing abdominal distention and acutely worsening shortness of breath. He has been getting monthly paracentesis and is scheduled to get paracentesis every 2 weeks and is due for next week. He denies nausea vomiting chest pain fever chills diarrhea or abdominal pain.  In the ER x-ray showed bilateral pleural effusion concerning for interstitial edema and also pericardial calcification with constrictive pericarditis. EKG with A. fib rate controlled. Underwent thoracentesis in the ER with 2 L fluid removal, 40 of Lasix was given and admitted for further work-up. Last creatinine 1.9 sodium 128 alk phos 131 albumin 3.3 lipase 34 AST 25 ALT 215 BNP 303 hemoglobin 13.4 COVID-19 was negative. Underwent ultrasound for paracentesis but minimal ascites noted to have left worse than right pleural effusion underwent 1.2 L fluid removal from the left thoracentesis.  Rest of hosptal course is as below  Discharge Diagnoses:  Principal Problem:   Acute respiratory failure with hypoxia ([facility]) Active Problems:   Atrial fibrillation (HCC)   Cirrhosis, alcoholic (HCC)   Ascites  Assessment & Plan:   Acute respiratory failure with hypoxia:suspect from fluid overload  pleural effusion, with decompensated liver cirrhosis ascites: Off oxygen.  Status post ascitic tap and left thoracentesis. He feels improved and on RA.   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  Await further recommendation from cardiology. Cardio to arrange o/p heart cath since he improved symptomatically  Decompensated liver cirrhosis with ascites currently  On 2 weekly paracentesis recently- he  Report he was on monthly schedule.No tenderness no pain.s/p paracentesis with 2 L of fluid removal. Now minimal ascites on ultrasound paracentesis deferred but,Noted to have left-sided pleural effusion.  Patient placed on empiric ceftriaxone- currently no fever no leukocytosis no suspicion for SBP and  Off abx. [place] on hold.  Bilateral pleural effusion likely due to patient's liver cirrhosis. 1.2 L fluid removed from lt and also had rt thoracentesis today and 1 litre clear yellow fluid removed. Pleural fluid appears transudative with low protein, gram stain neg.Contdiuretics at home.  Hyponatremia:Reports heison salt tab and recentlycut down due to fluid overload.Sodium ranges form low 100s to above. Recent Labs  Lab 03/08/19 2010 03/09/19 0709 03/10/19 0252 03/11/19 0240  NA 128* 129* 126* 124*     Alcohol abuse continue on CIWA scale.  No signs of withdrawal cessation advised.  Continue thiamine.  History of atrial fibrillation/flutter status post ablationrate controlled. Cardiology consulted given concern forConstrictive pericarditis, continue beta-blocker.  Not on anticoagulation  Body mass index is 29.76 kg/m.  DVT prophylaxis: SCD Code Status: FULL Family Communication:Plan of care discussed with patient in detail. Disposition Plan: home  Consultants:cardiology   Procedures:  Abdominal paracentesis 20/21 Left thoracentesis 20/21 Right thoracentesis 10/22  Microbiology: Pleural fluid pending    Discharge  Instructions  Discharge Instructions    Diet - low sodium heart healthy   Complete by: As  directed    Discharge instructions   Complete by: As directed    Please call call MD or return to ER for similar or worsening recurring problem that brought you to hospital or if any fever,nausea/vomiting,abdominal pain, uncontrolled pain, chest pain,  shortness of breath or any other alarming symptoms.  Please follow-up CARDIOLOGY Dr Harrington Challenger for heart cath Call your doctor as instructed in a week time and call the office for appointment. Monitor weight and swelling at home  Please avoid alcohol, smoking, or any other illicit substance and maintain healthy habits including taking your regular medications as prescribed.  You were cared for by a hospitalist during your hospital stay. If you have any questions about your discharge medications or the care you received while you were in the hospital after you are discharged, you can call the unit and ask to speak with the hospitalist on call if the hospitalist that took care of you is not available.  Once you are discharged, your primary care physician will handle any further medical issues. Please note that NO REFILLS for any discharge medications will be authorized once you are discharged, as it is imperative that you return to your primary care physician (or establish a relationship with a primary care physician if you do not have one) for your aftercare needs so that they can reassess your need for medications and monitor your lab values   Increase activity slowly   Complete by: As directed      Allergies as of 03/11/2019      Reactions   Lisinopril Anaphylaxis, Other (See Comments)   Hyperkalemia, Dizziness   Lipitor [atorvastatin] Other (See Comments)   Marked leg fatigue   Sulfonamide Derivatives Other (See Comments)   UNSPECIFIED REACTION  [Unsure of reaction]      Medication List    TAKE these medications   folic acid 1 MG tablet Commonly  known as: FOLVITE Take 1 tablet (1 mg total) by mouth daily.   furosemide 40 MG tablet Commonly known as: LASIX TAKE 2 TABLETS BY MOUTH IN THE MORNING AND 1 IN THE EVENING What changed: See the new instructions.   metoprolol succinate 25 MG 24 hr tablet Commonly known as: TOPROL-XL Take 1 tablet (25 mg total) by mouth at bedtime as needed (for HBP >130).   multivitamin with minerals Tabs tablet Take 1 tablet by mouth daily.   omeprazole 20 MG capsule Commonly known as: PRILOSEC Take 1 capsule (20 mg total) by mouth 2 (two) times daily.   spironolactone 25 MG tablet Commonly known as: ALDACTONE Take 1 tablet (25 mg total) by mouth daily. What changed:   medication strength  how much to take   thiamine 100 MG tablet Take 1 tablet (100 mg total) by mouth daily.            Durable Medical Equipment  (From admission, onward)         Start     Ordered   03/11/19 1320  For home use only DME Walker rolling  Once    Question:  Patient needs a walker to treat with the following condition  Answer:  Weakness   03/11/19 1320         Follow-up Information    Shon Baton, MD Follow up in 1 week(s).   Specialty: Internal Medicine Contact information: [place] [address] [phone]        Dorothy Spark, MD .   Specialty: Cardiology Contact information: [address] [place] [address] [phone]        [facility] Follow up.   Why: Best boy information: [place] [address] [phone]          Allergies  Allergen Reactions  . Lisinopril Anaphylaxis and Other (See Comments)    Hyperkalemia, Dizziness   . Lipitor [Atorvastatin] Other (See Comments)    Marked leg fatigue  . Sulfonamide Derivatives Other (See Comments)    UNSPECIFIED REACTION  [Unsure of reaction]   Procedures/Studies: Dg Chest 1 View  Result Date: 03/10/2019 CLINICAL DATA:  Status post  thoracentesis. EXAM: CHEST  1 VIEW COMPARISON:  03/09/2019 FINDINGS: Interval evacuation of the right pleural fluid collection. No postprocedural pneumothorax is identified. There is a persistent small left effusion and left basilar atelectasis. IMPRESSION: Evacuation of right pleural fluid collection without postprocedural pneumothorax. Persistent small left effusion and left basilar atelectasis. Electronically Signed   By: Marijo Sanes M.D.   On: 03/10/2019 12:42   Dg Chest 1 View  Result Date: 03/09/2019 CLINICAL DATA:  78 year old male status post left side ultrasound-guided thoracentesis this morning. EXAM: CHEST  1 VIEW COMPARISON:  03/08/2019 portable chest and earlier. FINDINGS: AP upright view at 1038 hours. Regressed left-side pleural effusion with improved left lung base ventilation. No pneumothorax. Stable right pleural effusion. Stable cardiac size and mediastinal contours. No areas of worsening ventilation. IMPRESSION: 1. Regressed left pleural effusion and improved ventilation at the left lung base. No pneumothorax. 2. Stable right pleural effusion. Electronically Signed   By: Genevie Ann M.D.   On: 03/09/2019 10:46   Dg Chest Portable 1 View  Result Date: 03/08/2019 CLINICAL DATA:  Shortness of breath EXAM: PORTABLE CHEST 1 VIEW COMPARISON:  CT 01/20/2019, radiograph 08/25/2018 FINDINGS: Bilateral pleural effusions with adjacent opacity in the lung bases likely reflecting areas of atelectasis. More patchy opacities present in the right lung base with central venous congestion and septal thickening. Calcifications are noted along the pericardium, also present on the comparison CT. Atherosclerotic calcification of the aortic arch is noted. No acute osseous or soft tissue abnormality. IMPRESSION: 1. Bilateral pleural effusions with bibasilar atelectasis. 2. Central venous congestion and septal thickening, consistent with interstitial edema. 3. More patchy opacities in the right lung base could  reflect superimposed edema or atelectasis 4. Pericardial calcifications, constrictive pericarditis is not excluded. Electronically Signed   By: Lovena Le M.D.   On: 03/08/2019 20:57   Ir Abdomen US Limited  Result Date: 03/09/2019 CLINICAL DATA:  Cirrhosis, abdominal discomfort, history of recurrent large volume abdominal ascites. EXAM: LIMITED ABDOMEN ULTRASOUND FOR ASCITES TECHNIQUE: Limited ultrasound survey for ascites was performed in all four abdominal quadrants. COMPARISON:  02/28/2019 and previous FINDINGS: Survey ultrasound shows minimal abdominal ascites. IMPRESSION: Minimal ascites.  Paracentesis deferred. Electronically Signed   By: Lucrezia Europe M.D.   On: 03/09/2019 10:29   Ir Paracentesis  Result Date: 02/28/2019 INDICATION: Patient with history of alcoholic cirrhosis, recurrent ascites. Request for therapeutic paracentesis with 4 L maximum. EXAM: ULTRASOUND GUIDED THERAPEUTIC PARACENTESIS MEDICATIONS: 8 mL 1% lidocaine COMPLICATIONS: None immediate. PROCEDURE: Informed written consent was obtained from the patient after a discussion of the risks, benefits and alternatives to treatment. A timeout was performed prior to the initiation of the procedure. Initial ultrasound scanning demonstrates a large amount of ascites within the right lower abdominal quadrant. The right lower abdomen was prepped and draped in the usual sterile fashion. 1% lidocaine was used for local anesthesia. Following  this, a 19 gauge, 7-cm, Yueh catheter was introduced. An ultrasound image was saved for documentation purposes. The paracentesis was performed. The catheter was removed and a dressing was applied. The patient tolerated the procedure well without immediate post procedural complication. FINDINGS: A total of approximately 4.0 L of clear yellow fluid was removed. IMPRESSION: Successful ultrasound-guided paracentesis yielding 4.0 liters of peritoneal fluid. Read by Candiss Norse, PA-C Electronically Signed    By: Markus Daft M.D.   On: 02/28/2019 09:46   Ir Paracentesis  Result Date: 02/15/2019 INDICATION: Patient with history of alcoholic cirrhosis, recurrent ascites. Request IR for therapeutic paracentesis with 4 L maximum. EXAM: ULTRASOUND GUIDED THERAPEUTIC PARACENTESIS MEDICATIONS: 10 mL 1% lidocaine COMPLICATIONS: None immediate. PROCEDURE: Informed written consent was obtained from the patient after a discussion of the risks, benefits and alternatives to treatment. A timeout was performed prior to the initiation of the procedure. Initial ultrasound scanning demonstrates a large amount of ascites within the left lower abdominal quadrant. The left lower abdomen was prepped and draped in the usual sterile fashion. 1% lidocaine was used for local anesthesia. Following this, a 19 gauge, 7-cm, Yueh catheter was introduced. An ultrasound image was saved for documentation purposes. The paracentesis was performed. The catheter was removed and a dressing was applied. The patient tolerated the procedure well without immediate post procedural complication. FINDINGS: A total of approximately 3.6 L of hazy yellow fluid was removed. IMPRESSION: Successful ultrasound-guided paracentesis yielding 3.6 liters of peritoneal fluid. Read by Candiss Norse, PA-C Electronically Signed   By: Aletta Edouard M.D.   On: 02/15/2019 10:01   Ir Thoracentesis Asp Pleural Space W/img Guide  Result Date: 03/10/2019 INDICATION: Shortness of breath. History of underlying alcoholic cirrhosis. Right-sided pleural effusion. Request for therapeutic thoracentesis. EXAM: ULTRASOUND GUIDED RIGHT THORACENTESIS MEDICATIONS: None. COMPLICATIONS: None immediate. PROCEDURE: An ultrasound guided thoracentesis was thoroughly discussed with the patient and questions answered. The benefits, risks, alternatives and complications were also discussed. The patient understands and wishes to proceed with the procedure. Written consent was obtained. Ultrasound  was performed to localize and mark an adequate pocket of fluid in the right chest. The area was then prepped and draped in the normal sterile fashion. 1% Lidocaine was used for local anesthesia. Under ultrasound guidance a 6 Fr Safe-T-Centesis catheter was introduced. Thoracentesis was performed. The catheter was removed and a dressing applied. FINDINGS: A total of approximately 1 L of clear yellow fluid was removed. IMPRESSION: Successful ultrasound guided right thoracentesis yielding 1 L of pleural fluid. Read by: Ascencion Dike PA-C Electronically Signed   By: Jerilynn Mages.  Shick M.D.   On: 03/10/2019 12:12   Ir Thoracentesis Asp Pleural Space W/img Guide  Result Date: 13/12/6576 INDICATION: Folic cirrhosis. Left pleural effusion. Request for diagnostic and therapeutic thoracentesis. EXAM: ULTRASOUND GUIDED LEFT THORACENTESIS MEDICATIONS: 1% lidocaine 10 mL COMPLICATIONS: None immediate. PROCEDURE: An ultrasound guided thoracentesis was thoroughly discussed with the patient and questions answered. The benefits, risks, alternatives and complications were also discussed. The patient understands and wishes to proceed with the procedure. Written consent was obtained. Ultrasound was performed to localize and mark an adequate pocket of fluid in the left chest. The area was then prepped and draped in the normal sterile fashion. 1% Lidocaine was used for local anesthesia. Under ultrasound guidance a 6 Fr Safe-T-Centesis catheter was introduced. Thoracentesis was performed. The catheter was removed and a dressing applied. FINDINGS: A total of approximately 1.2 L of hazy yellow fluid was removed. Samples were sent to the laboratory  as requested by the clinical team. IMPRESSION: Successful ultrasound guided left thoracentesis yielding 1.2 L of pleural fluid. No pneumothorax on post-procedure chest x-ray. Read by: Gareth Eagle, PA-C Electronically Signed   By: Jacqulynn Cadet M.D.   On: 03/09/2019 11:23    Subjective: Resting  well on RA, no sob, feels well,wants to go home  Discharge Exam: Vitals:   03/11/19 0428 03/11/19 0753  BP: 112/76 106/87  Pulse: 91 (!) 101  Resp: 18 20  Temp: 98.4 F (36.9 C) 98.6 F (37 C)  SpO2: 100% 100%   Vitals:   03/10/19 2123 03/11/19 0036 03/11/19 0428 03/11/19 0753  BP: 128/85 130/86 112/76 106/87  Pulse: 100 97 91 (!) 101  Resp: '18 14 18 20  '$ Temp: 98.2 F (36.8 C) 98 F (36.7 C) 98.4 F (36.9 C) 98.6 F (37 C)  TempSrc: Oral Oral Oral Oral  SpO2: 94% 96% 100% 100%  Weight:      Height:        General: Pt is alert, awake, not in acute distress Cardiovascular: RRR, S1/S2 +, no rubs, no gallops Respiratory: CTA bilaterally, no wheezing, no rhonchi Abdominal: Soft, NT, ND, bowel sounds + Extremities: no edema, no cyanosis   The results of significant diagnostics from this hospitalization (including imaging, microbiology, ancillary and laboratory) are listed below for reference.     Microbiology: Recent Results (from the past 240 hour(s))  SARS CORONAVIRUS 2 (TAT 6-24 HRS) Nasopharyngeal Nasopharyngeal Swab     Status: None   Collection Time: 03/09/19 12:34 AM   Specimen: Nasopharyngeal Swab  Result Value Ref Range Status   SARS Coronavirus 2 NEGATIVE NEGATIVE Final    Comment: (NOTE) SARS-CoV-2 target nucleic acids are NOT DETECTED. The SARS-CoV-2 RNA is generally detectable in upper and lower respiratory specimens during the acute phase of infection. Negative results do not preclude SARS-CoV-2 infection, do not rule out co-infections with other pathogens, and should not be used as the sole basis for treatment or other patient management decisions. Negative results must be combined with clinical observations, patient history, and epidemiological information. The expected result is Negative. Fact Sheet for Patients: SugarRoll.be Fact Sheet for Healthcare Providers: https://www.woods-mathews.com/ This test is  not yet approved or cleared by the [place] FDA and  has been authorized for detection and/or diagnosis of SARS-CoV-2 by FDA under an Emergency Use Authorization (EUA). This EUA will remain  in effect (meaning this test can be used) for the duration of the COVID-19 declaration under Section 56 4(b)(1) of the Act, 21 U.S.C. section 360bbb-3(b)(1), unless the authorization is terminated or revoked sooner. Performed at [facility], [place] [address] [place], [place] [address]   Gram stain     Status: None   Collection Time: 03/09/19 10:49 AM   Specimen: Lung, Left; Pleural Fluid  Result Value Ref Range Status   Specimen Description PLEURAL LEFT  Final   Special Requests NONE  Final   Gram Stain   Final    RARE WBC PRESENT, PREDOMINANTLY MONONUCLEAR NO ORGANISMS SEEN Performed at [facility], [place] [address], [place], [place] [address]    Report Status 03/09/2019 FINAL  Final  Culture, body fluid-bottle     Status: None (Preliminary result)   Collection Time: 03/09/19 10:49 AM   Specimen: Pleura  Result Value Ref Range Status   Specimen Description PLEURAL LEFT  Final   Special Requests NONE  Final   Culture   Final    NO GROWTH 2  DAYS Performed at [facility], [place] [address] [place], [place] [address]    Report Status PENDING  Incomplete     Labs: BNP (last 3 results) Recent Labs    08/25/18 1316 03/09/19 0034  BNP 586.3* 277.8*   Basic Metabolic Panel: Recent Labs  Lab 03/08/19 2010 03/09/19 0709 03/10/19 0252 03/11/19 0240  NA 128* 129* 126* 124*  K 3.6 4.2 3.7 3.4*  CL 91* 92* 90* 88*  CO2 '25 25 25 26  '$ GLUCOSE 120* 119* 120* 120*  BUN '12 11 11 11  '$ CREATININE 0.96 0.79 0.77 0.67  CALCIUM 8.9 8.7* 8.5* 8.4*  MG  --  1.9  --   --   PHOS  --  3.3  --   --    Liver Function Tests: Recent Labs  Lab 03/08/19 2010 03/09/19 0709 03/10/19 0252 03/11/19 0240  AST 25 40  41 20 21  ALT '15 13  17 13 12  '$ ALKPHOS 151* 134*  134* 126  120  BILITOT 2.1* 3.1*  3.2* 3.0* 2.6*  PROT 7.6 6.6  6.7 6.1* 5.9*  ALBUMIN 3.3* 2.9*  2.9* 2.6* 2.4*   Recent Labs  Lab 03/08/19 2010  LIPASE 34   No results for input(s): AMMONIA in the last 168 hours. CBC: Recent Labs  Lab 03/08/19 2010 03/09/19 0709 03/10/19 0252 03/11/19 0240  WBC 6.6 4.9 5.7 4.3  NEUTROABS  --  3.8  --   --   HGB 13.4 13.1 12.3* 11.7*  HCT 39.7 38.1* 36.0* 33.6*  MCV 89.2 89.0 87.6 85.9  PLT 191 167 191 169   Cardiac Enzymes: No results for input(s): CKTOTAL, CKMB, CKMBINDEX, TROPONINI in the last 168 hours. BNP: Invalid input(s): POCBNP CBG: Recent Labs  Lab 03/09/19 0247  GLUCAP 115*   D-Dimer No results for input(s): DDIMER in the last 72 hours. Hgb A1c No results for input(s): HGBA1C in the last 72 hours. Lipid Profile No results for input(s): CHOL, HDL, LDLCALC, TRIG, CHOLHDL, LDLDIRECT in the last 72 hours. Thyroid function studies No results for input(s): TSH, T4TOTAL, T3FREE, THYROIDAB in the last 72 hours.  Invalid input(s): FREET3 Anemia work up No results for input(s): VITAMINB12, FOLATE, FERRITIN, TIBC, IRON, RETICCTPCT in the last 72 hours. Urinalysis    Component Value Date/Time   COLORURINE YELLOW 08/25/2018 1500   APPEARANCEUR CLEAR 08/25/2018 1500   LABSPEC 1.014 08/25/2018 1500   PHURINE 6.0 08/25/2018 1500   GLUCOSEU NEGATIVE 08/25/2018 1500   HGBUR NEGATIVE 08/25/2018 1500   BILIRUBINUR NEGATIVE 08/25/2018 1500   KETONESUR NEGATIVE 08/25/2018 1500   PROTEINUR 30 (A) 08/25/2018 1500   NITRITE NEGATIVE 08/25/2018 1500   LEUKOCYTESUR NEGATIVE 08/25/2018 1500   Sepsis Labs Invalid input(s): PROCALCITONIN,  WBC,  LACTICIDVEN Microbiology Recent Results (from the past 240 hour(s))  SARS CORONAVIRUS 2 (TAT 6-24 HRS) Nasopharyngeal Nasopharyngeal Swab     Status: None   Collection Time: 03/09/19 12:34 AM   Specimen: Nasopharyngeal Swab  Result Value Ref Range Status   SARS Coronavirus 2 NEGATIVE NEGATIVE  Final    Comment: (NOTE) SARS-CoV-2 target nucleic acids are NOT DETECTED. The SARS-CoV-2 RNA is generally detectable in upper and lower respiratory specimens during the acute phase of infection. Negative results do not preclude SARS-CoV-2 infection, do not rule out co-infections with other pathogens, and should not be used as the sole basis for treatment or other patient management decisions. Negative results must be combined with clinical observations, patient history, and epidemiological information. The expected  result is Negative. Fact Sheet for Patients: SugarRoll.be Fact Sheet for Healthcare Providers: https://www.woods-mathews.com/ This test is not yet approved or cleared by the [place] FDA and  has been authorized for detection and/or diagnosis of SARS-CoV-2 by FDA under an Emergency Use Authorization (EUA). This EUA will remain  in effect (meaning this test can be used) for the duration of the COVID-19 declaration under Section 56 4(b)(1) of the Act, 21 U.S.C. section 360bbb-3(b)(1), unless the authorization is terminated or revoked sooner. Performed at [facility], [place] [address], [place], [place] [address]   Gram stain     Status: None   Collection Time: 03/09/19 10:49 AM   Specimen: Lung, Left; Pleural Fluid  Result Value Ref Range Status   Specimen Description PLEURAL LEFT  Final   Special Requests NONE  Final   Gram Stain   Final    RARE WBC PRESENT, PREDOMINANTLY MONONUCLEAR NO ORGANISMS SEEN Performed at [facility], [place] [address] [place], [place] [address]    Report Status 03/09/2019 FINAL  Final  Culture, body fluid-bottle     Status: None (Preliminary result)   Collection Time: 03/09/19 10:49 AM   Specimen: Pleura  Result Value Ref Range Status   Specimen Description PLEURAL LEFT  Final   Special Requests NONE  Final   Culture   Final    NO GROWTH 2 DAYS Performed at [place] [address], [place], [place] [address]    Report Status PENDING  Incomplete     Time coordinating discharge: 25 minutes  SIGNED:   Antonieta Pert, MD  [facility] 03/11/2019, 1:22 PM  If 7PM-7AM, please contact night-coverage [email or web address]

## 2019-03-11 NOTE — TOC Initial Note (Signed)
Transition of Care [facility]) - Initial/Assessment Note    Patient Details  Name: [name] MRN: [id number] Date of Birth: November 27, 1940  [facility]) CM/SW Contact:    Bethena Roys, RN Phone Number: 03/11/2019, 12:32 PM  Clinical Narrative: Pt presented for Acute respiratory failure with hypoxia. PTA From home alone. Patient has assistance from son. Pt has DME [facility] and has declined [facility] at this time. CM was able to speak with patient with assistance of the Staff RN-Patient states he has no problem navigating around his home. CM did make patient aware to call PCP if he needs assistance once in the home. No further needs identified.                     Expected Discharge Plan: Home/Self Care Barriers to Discharge: No Barriers Identified   Patient Goals and CMS Choice Patient states their goals for this hospitalization and ongoing recovery are:: "to return home"   Choice offered to / list presented to : Parent  Expected Discharge Plan and Services Expected Discharge Plan: Home/Self Care In-house Referral: NA Discharge Planning Services: CM Consult Post Acute Care Choice: NA Living arrangements for the past 2 months: Single Family Home Expected Discharge Date: 03/11/19               HH Arranged: Refused HH   Prior Living Arrangements/Services Living arrangements for the past 2 months: Single Family Home Lives with:: Self(son lives next door- to assist.) Patient language and need for interpreter reviewed:: Yes        Need for Family Participation in Patient Care: No (Comment) Care giver support system in place?: No (comment)   Criminal Activity/Legal Involvement Pertinent to Current Situation/Hospitalization: No - Comment as needed  Activities of Daily Living Home Assistive Devices/Equipment: Eyeglasses, Hearing aid, Walker (specify type) ADL Screening (condition at time of admission) Patient's cognitive ability adequate to safely complete  daily activities?: Yes Is the patient deaf or have difficulty hearing?: Yes Does the patient have difficulty seeing, even when wearing glasses/contacts?: No Does the patient have difficulty concentrating, remembering, or making decisions?: No Patient able to express need for assistance with ADLs?: Yes Does the patient have difficulty dressing or bathing?: No Independently performs ADLs?: Yes (appropriate for developmental age) Does the patient have difficulty walking or climbing stairs?: Yes Weakness of Legs: Both Weakness of Arms/Hands: None  Permission Sought/Granted Permission sought to share information with : Case Manager    Emotional Assessment Appearance:: Appears stated age Attitude/Demeanor/Rapport: Gracious Affect (typically observed): Appropriate Orientation: : Oriented to Self, Oriented to Place, Oriented to  Time, Oriented to Situation Alcohol / Substance Use: Not Applicable Psych Involvement: No (comment)  Admission diagnosis:  SOB (shortness of breath) [R06.02] Other ascites [R18.8] Pleural effusion on left [J90] Ascites [R18.8] Hypervolemia, unspecified hypervolemia type [E87.70] Acute respiratory failure with hypoxia ([place]) [J96.01] Patient Active Problem List   Diagnosis Date Noted  . Acute respiratory failure with hypoxia ([place]) 03/09/2019  . Hypervolemia   . Pleural effusion on left   . SOB (shortness of breath)   . Constipation 08/28/2018  . Cirrhosis of liver with ascites ([place]) 08/26/2018  . Leukopenia 07/16/2018  . Lung nodule 07/16/2018  . Coagulopathy ([place]) 07/15/2018  . Iron deficiency anemia 02/19/2016  . Microcytic anemia 02/18/2016  . GERD (gastroesophageal reflux disease) 02/18/2016  . Orthostatic hypotension 02/17/2016  . Syncope 02/17/2016  . Hyponatremia 02/17/2016  . Varicose veins of bilateral lower extremities with other complications  01/07/2016  . Cirrhosis, alcoholic ([place]) 40/76/8088  . Ascites 10/07/2012  . Varicose veins of lower  extremities with other complications 03/21/1593  . HYPERLIPIDEMIA 11/01/2008  . ALCOHOLISM 11/01/2008  . Essential hypertension 11/01/2008  . Atrial fibrillation ([place]) 11/01/2008  . Atrial flutter ([place]) 11/01/2008  . HEMORRHOIDS 11/01/2008  . ISCHEMIA 11/01/2008  . HERNIA, UMBILICAL 58/59/2924  . DIVERTICULOSIS OF COLON 11/01/2008  . GERD 12/16/2007  . COLONIC POLYPS, ADENOMATOUS, HX OF 12/16/2007   PCP:  Shon Baton, MD Pharmacy:   [facility] [place], [place] - [facility] [address] [place] [address] Phone: [phone] Fax: [phone]     Social Determinants of Health (SDOH) Interventions    Readmission Risk Interventions No flowsheet data found.

## 2019-03-11 NOTE — Care Management Important Message (Signed)
Important Message  Patient Details  Name: [name] MRN: [id number] Date of Birth: 12/03/1940   Medicare Important Message Given:  Yes     Akshay Spang 03/11/2019, 3:12 PM

## 2019-03-11 NOTE — Progress Notes (Signed)
Physical Therapy Treatment Patient Details Name: [name] MRN: [id number] DOB: 12-28-1940 Today's Date: 03/11/2019    History of Present Illness [name] is a 78 y.o. male with history of alcoholic liver cirrhosis, atrial flutter status post ablation presently rate controlled on beta-blockers, hypertension, history of portal gastropathy with AVMs presents to the ER because of increasing abdominal distention and acute worsening of shortness of breath. X-ray showed bilateral pleural effusion with features concerning for interstitial edema and also pericardial calcification with constrictive pericarditis.    PT Comments    Pt admitted with above diagnosis. Pt was able to ambulate in room and does better with RW.  Wants to get a RW for home.  Pt going home today.  Declines HHPT.   Pt currently with functional limitations due to balance and endurance deficits. Pt will benefit from skilled PT to increase their independence and safety with mobility to allow discharge to the venue listed below.     Follow Up Recommendations  No PT follow up;Supervision for mobility/OOB(Pt declines HHPT)     Equipment Recommendations  Rolling walker with 5" wheels    Recommendations for Other Services       Precautions / Restrictions Precautions Precautions: Fall Restrictions Weight Bearing Restrictions: No    Mobility  Bed Mobility Overal bed mobility: Modified Independent             General bed mobility comments: to get back into bed.  Was in bathroom on arirval.  Transfers Overall transfer level: Needs assistance Equipment used: Rolling walker (2 wheeled) Transfers: Sit to/from Stand Sit to Stand: Supervision            Ambulation/Gait Ambulation/Gait assistance: Min guard Gait Distance (Feet): 40 Feet Assistive device: Rolling walker (2 wheeled);None Gait Pattern/deviations: Step-through pattern;Wide base of support   Gait velocity interpretation: <1.31 ft/sec, indicative  of household ambulator General Gait Details: Utilizing wide BOS, min guard for safety. Needs cueing for walker proximity, staying inside of the walker during turns. Also walked without RW with fair stability holding onto furntiure. Adjusted RW and showed pt how to adjust as he is getting one for home.    Stairs             Wheelchair Mobility    Modified [name] (Stroke Patients Only)       Balance Overall balance assessment: Needs assistance Sitting-balance support: Feet supported;No upper extremity supported Sitting balance-Leahy Scale: Good     Standing balance support: No upper extremity supported;During functional activity Standing balance-Leahy Scale: Fair Standing balance comment: can stand statically wihtout UE support                            Cognition Arousal/Alertness: Awake/alert Behavior During Therapy: Flat affect Overall Cognitive Status: Within Functional Limits for tasks assessed                                 General Comments: Difficult to assess due to [facility]      Exercises      General Comments        Pertinent Vitals/Pain Faces Pain Scale: No hurt    Home Living                      Prior Function            PT Goals (current goals can now  be found in the care plan section) Progress towards PT goals: Progressing toward goals    Frequency    Min 3X/week      PT Plan Other (comment);Equipment recommendations need to be updated    Co-evaluation              AM-PAC PT "6 Clicks" Mobility   Outcome Measure  Help needed turning from your back to your side while in a flat bed without using bedrails?: None Help needed moving from lying on your back to sitting on the side of a flat bed without using bedrails?: None Help needed moving to and from a bed to a chair (including a wheelchair)?: None Help needed standing up from a chair using your arms (e.g., wheelchair or bedside chair)?:  None Help needed to walk in hospital room?: A Little Help needed climbing 3-5 steps with a railing? : A Little 6 Click Score: 22    End of Session Equipment Utilized During Treatment: Gait belt Activity Tolerance: Patient tolerated treatment well Patient left: with call bell/phone within reach;in bed Nurse Communication: Mobility status PT Visit Diagnosis: Unsteadiness on feet (R26.81);History of falling (Z91.81);Difficulty in walking, not elsewhere classified (R26.2)     Time: 1200-1210 PT Time Calculation (min) (ACUTE ONLY): 10 min  Charges:  $Gait Training: 8-22 mins                     [place] Pager:  [phone]  Office:  [place] 03/11/2019, 12:44 PM

## 2019-03-11 NOTE — TOC Transition Note (Signed)
Transition of Care [facility]) - CM/SW Discharge Note   Patient Details  Name: CARR SHARTZER MRN: [id number] Date of Birth: Dec 22, 1940  Transition of Care [facility]) CM/SW Contact:  Bethena Roys, RN Phone Number: 03/11/2019, 1:00 PM   Clinical Narrative: PT recommendations for [facility]- DME order sent to [facility]- DME to be delivered to room- Elmyra Ricks staff RN aware. No further needs from CM at this time.     Final next level of care: Home/Self Care Barriers to Discharge: No Barriers Identified   Patient Goals and CMS Choice Patient states their goals for this hospitalization and ongoing recovery are:: "to return home"   Choice offered to / list presented to : Parent   Discharge Plan and Services In-house Referral: NA Discharge Planning Services: CM Consult Post Acute Care Choice: NA          DME Arranged: Walker rolling DME Agency: [facility] Date DME Agency Contacted: 03/11/19 Time DME Agency Contacted: 1300 Representative spoke with at DME Agency: [place]: Refused [place]    Social Determinants of Health ([place]) Interventions     Readmission Risk Interventions No flowsheet data found.

## 2019-03-11 NOTE — Progress Notes (Addendum)
Progress Note  Patient Name: [name] Date of Encounter: 03/11/2019  Primary Cardiologist: Ena Dawley, MD   Subjective   Pt feels much better, can rest in a recumbent position.   Inpatient Medications    Scheduled Meds:  folic acid  1 mg Oral Daily   furosemide  40 mg Intravenous Daily   linaclotide  145 mcg Oral QAC breakfast   Melatonin  3 mg Oral QHS   multivitamin with minerals  1 tablet Oral Daily   potassium chloride  40 mEq Oral Once   thiamine  100 mg Oral Daily   Or   thiamine  100 mg Intravenous Daily   Continuous Infusions:  sodium chloride 5 mL (03/10/19 0538)   PRN Meds: sodium chloride, acetaminophen **OR** acetaminophen, lidocaine, LORazepam **OR** LORazepam, metoprolol succinate, ondansetron **OR** ondansetron (ZOFRAN) IV, traMADol   Vital Signs    Vitals:   03/10/19 2123 03/11/19 0036 03/11/19 0428 03/11/19 0753  BP: 128/85 130/86 112/76 106/87  Pulse: 100 97 91 (!) 101  Resp: 18 14 18 20   Temp: 98.2 F (36.8 C) 98 F (36.7 C) 98.4 F (36.9 C) 98.6 F (37 C)  TempSrc: Oral Oral Oral Oral  SpO2: 94% 96% 100% 100%  Weight:      Height:        Intake/Output Summary (Last 24 hours) at 03/11/2019 0851 Last data filed at 03/10/2019 1701 Gross per 24 hour  Intake 500 ml  Output --  Net 500 ml   Last 3 Weights 03/08/2019 08/31/2018 08/30/2018  Weight (lbs) 190 lb 164 lb 167 lb 15.9 oz  Weight (kg) 86.183 kg 74.39 kg 76.2 kg      Telemetry    Atrial fibrillation with controlled ventricular rate in the 70-80s - Personally Reviewed  ECG    No new tracings - Personally Reviewed  Physical Exam   GEN: No acute distress.   Neck: No JVD Cardiac: irregular rhythm, regular rate, no murmur Respiratory: Clear to auscultation bilaterally. GI: Soft, nontender, non-distended  MS: minimal edema with chronic skin changes from agent orange Neuro:  Nonfocal  Psych: Normal affect   Labs    High Sensitivity Troponin:  No results  for input(s): TROPONINIHS in the last 720 hours.    Chemistry Recent Labs  Lab 03/09/19 0709 03/10/19 0252 03/11/19 0240  NA 129* 126* 124*  K 4.2 3.7 3.4*  CL 92* 90* 88*  CO2 25 25 26   GLUCOSE 119* 120* 120*  BUN 11 11 11   CREATININE 0.79 0.77 0.67  CALCIUM 8.7* 8.5* 8.4*  PROT 6.6   6.7 6.1* 5.9*  ALBUMIN 2.9*   2.9* 2.6* 2.4*  AST 40   41 20 21  ALT 13   17 13 12   ALKPHOS 134*   134* 126 120  BILITOT 3.1*   3.2* 3.0* 2.6*  GFRNONAA >60 >60 >60  GFRAA >60 >60 >60  ANIONGAP 12 11 10      Hematology Recent Labs  Lab 03/09/19 0709 03/10/19 0252 03/11/19 0240  WBC 4.9 5.7 4.3  RBC 4.28 4.11* 3.91*  HGB 13.1 12.3* 11.7*  HCT 38.1* 36.0* 33.6*  MCV 89.0 87.6 85.9  MCH 30.6 29.9 29.9  MCHC 34.4 34.2 34.8  RDW 17.1* 16.6* 16.3*  PLT 167 191 169    BNP Recent Labs  Lab 03/09/19 0034  BNP 303.9*     DDimer No results for input(s): DDIMER in the last 168 hours.   Radiology    Dg Chest 1  View  Result Date: 03/10/2019 CLINICAL DATA:  Status post thoracentesis. EXAM: CHEST  1 VIEW COMPARISON:  03/09/2019 FINDINGS: Interval evacuation of the right pleural fluid collection. No postprocedural pneumothorax is identified. There is a persistent small left effusion and left basilar atelectasis. IMPRESSION: Evacuation of right pleural fluid collection without postprocedural pneumothorax. Persistent small left effusion and left basilar atelectasis. Electronically Signed   By: Marijo Sanes M.D.   On: 03/10/2019 12:42   Dg Chest 1 View  Result Date: 03/09/2019 CLINICAL DATA:  78 year old male status post left side ultrasound-guided thoracentesis this morning. EXAM: CHEST  1 VIEW COMPARISON:  03/08/2019 portable chest and earlier. FINDINGS: AP upright view at 1038 hours. Regressed left-side pleural effusion with improved left lung base ventilation. No pneumothorax. Stable right pleural effusion. Stable cardiac size and mediastinal contours. No areas of worsening ventilation.  IMPRESSION: 1. Regressed left pleural effusion and improved ventilation at the left lung base. No pneumothorax. 2. Stable right pleural effusion. Electronically Signed   By: Genevie Ann M.D.   On: 03/09/2019 10:46   Ir Abdomen US Limited  Result Date: 03/09/2019 CLINICAL DATA:  Cirrhosis, abdominal discomfort, history of recurrent large volume abdominal ascites. EXAM: LIMITED ABDOMEN ULTRASOUND FOR ASCITES TECHNIQUE: Limited ultrasound survey for ascites was performed in all four abdominal quadrants. COMPARISON:  02/28/2019 and previous FINDINGS: Survey ultrasound shows minimal abdominal ascites. IMPRESSION: Minimal ascites.  Paracentesis deferred. Electronically Signed   By: Lucrezia Europe M.D.   On: 03/09/2019 10:29   Ir Thoracentesis Asp Pleural Space W/img Guide  Result Date: 03/10/2019 INDICATION: Shortness of breath. History of underlying alcoholic cirrhosis. Right-sided pleural effusion. Request for therapeutic thoracentesis. EXAM: ULTRASOUND GUIDED RIGHT THORACENTESIS MEDICATIONS: None. COMPLICATIONS: None immediate. PROCEDURE: An ultrasound guided thoracentesis was thoroughly discussed with the patient and questions answered. The benefits, risks, alternatives and complications were also discussed. The patient understands and wishes to proceed with the procedure. Written consent was obtained. Ultrasound was performed to localize and mark an adequate pocket of fluid in the right chest. The area was then prepped and draped in the normal sterile fashion. 1% Lidocaine was used for local anesthesia. Under ultrasound guidance a 6 Fr Safe-T-Centesis catheter was introduced. Thoracentesis was performed. The catheter was removed and a dressing applied. FINDINGS: A total of approximately 1 L of clear yellow fluid was removed. IMPRESSION: Successful ultrasound guided right thoracentesis yielding 1 L of pleural fluid. Read by: Ascencion Dike PA-C Electronically Signed   By: Jerilynn Mages.  Shick M.D.   On: 03/10/2019 12:12   Ir  Thoracentesis Asp Pleural Space W/img Guide  Result Date: 16/11/3708 INDICATION: Folic cirrhosis. Left pleural effusion. Request for diagnostic and therapeutic thoracentesis. EXAM: ULTRASOUND GUIDED LEFT THORACENTESIS MEDICATIONS: 1% lidocaine 10 mL COMPLICATIONS: None immediate. PROCEDURE: An ultrasound guided thoracentesis was thoroughly discussed with the patient and questions answered. The benefits, risks, alternatives and complications were also discussed. The patient understands and wishes to proceed with the procedure. Written consent was obtained. Ultrasound was performed to localize and mark an adequate pocket of fluid in the left chest. The area was then prepped and draped in the normal sterile fashion. 1% Lidocaine was used for local anesthesia. Under ultrasound guidance a 6 Fr Safe-T-Centesis catheter was introduced. Thoracentesis was performed. The catheter was removed and a dressing applied. FINDINGS: A total of approximately 1.2 L of hazy yellow fluid was removed. Samples were sent to the laboratory as requested by the clinical team. IMPRESSION: Successful ultrasound guided left thoracentesis yielding 1.2 L of pleural fluid.  No pneumothorax on post-procedure chest x-ray. Read by: Gareth Eagle, PA-C Electronically Signed   By: Jacqulynn Cadet M.D.   On: 03/09/2019 11:23    Cardiac Studies   Echo 03/09/19:  1. Left ventricular ejection fraction, by visual estimation, is 60 to 65%. The left ventricle has normal function. There is mildly increased left ventricular hypertrophy.  2. Constrictive physiology.  3. Left ventricular diastolic Doppler parameters are indeterminate pattern of LV diastolic filling.  4. Global right ventricle has normal systolic function.The right ventricular size is normal. No increase in right ventricular wall thickness.  5. Left atrial size was normal.  6. Right atrial size was mildly dilated.  7. Moderate ascites is present.  8. Trivial pericardial effusion is  present.  9. Mild mitral annular calcification. 10. The mitral valve is normal in structure. Trace mitral valve regurgitation. 11. The tricuspid valve is grossly normal. Tricuspid valve regurgitation is trivial. 12. The aortic valve is abnormal Aortic valve regurgitation was not visualized by color flow Doppler. 13. There is Moderate calcification of the aortic valve. 14. There is Moderate thickening of the aortic valve. 15. The pulmonic valve was grossly normal. Pulmonic valve regurgitation is not visualized by color flow Doppler. 16. Moderately elevated pulmonary artery systolic pressure. 17. The inferior vena cava is dilated in size with <50% respiratory variability, suggesting right atrial pressure of 15 mmHg.  Patient Profile     78 y.o. male with a hx of alcoholic liver cirrhosis undergoing paracentesis every two weeks, HTN, atrial flutter s/p remote ablation on BB, hxo fo protal gastropathy with AVMs  who is being seen for constritive pericarditis.    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 Bilateral pleural effusions - has had bilateral thoracenteses  - feels his breathing is much better  3. Alcoholic liver cirrhosis with scheduled paracentesis every 2 weeks - bedside paracentesis in ER with little improvement in breathing - continue scheduled paracentesis outpatient  [facility] will sign off.   Medication Recommendations:  No change in medication Other recommendations (labs, testing, etc):   Follow up as an outpatient:  Cardiology follow up, possible R/L heart cath OP  For questions or updates, please contact [place] Please consult [email or web address] for contact info under     Signed, Ledora Bottcher, PA  03/11/2019, 8:51 AM     The  patient was seen, examined and discussed with Minette Brine , PA-C and I agree with the above.   The patient is feeling significantly better after B/L thoracentesis and increasing diuretics dose, he wishes to go home.  I think this is reasonable, I would arrange for follow-up in our clinic and if he has recurrent symptoms then we can consider a right-sided cath in the future.  I would discharge home on Lasix 40 mg p.o. twice daily and spironolactone 25 mg daily.  He will need follow-up labs in 2 weeks.  Ena Dawley, MD 03/11/2019

## 2019-03-12 ENCOUNTER — Encounter (INDEPENDENT_AMBULATORY_CARE_PROVIDER_SITE_OTHER): Payer: Self-pay

## 2019-03-14 LAB — CULTURE, BODY FLUID W GRAM STAIN -BOTTLE: Culture: NO GROWTH

## 2019-03-15 ENCOUNTER — Telehealth: Payer: Self-pay | Admitting: Cardiology

## 2019-03-15 NOTE — Telephone Encounter (Signed)
New Message  Patient is calling in stating that he is returning a call to schedule a heart catherization. Please call patient back to schedule.

## 2019-03-15 NOTE — Telephone Encounter (Signed)
Per Dr. Meda Coffee, this pt will need a post-hospital or TOC appt with our office, not a cath at this time.  Appt can be made with an Extender in our office.  Dr. Francesca Oman scheduler AV, is aware that this pt needs this appt, per Dr. Meda Coffee.  Scheduler will call to schedule this pt.

## 2019-03-15 NOTE — Telephone Encounter (Signed)
Pt is scheduled for his post hospital follow-up appt for 03/29/19 at 1:30 pm with Vin Bhagat PA-C.  Pt made aware of appt date and time by Scheduling.

## 2019-03-16 DIAGNOSIS — E871 Hypo-osmolality and hyponatremia: Secondary | ICD-10-CM | POA: Diagnosis not present

## 2019-03-17 ENCOUNTER — Telehealth: Payer: Self-pay | Admitting: *Deleted

## 2019-03-17 NOTE — Telephone Encounter (Signed)
-----   Message from Rivka Barbara sent at 03/16/2019  8:02 AM EDT ----- Regarding: RE: needs post hospital follow-up appt Good morning,  Patient called back and scheduled an appt. He will be seeing Vin Bhagat on 11/10 at 1:30pm. The appt request did not say a TOC. It just said he needed to be seen in 2-3 weeks. Have a great day! ----- Message ----- From: Nuala Alpha, LPN Sent: 80/16/5537  11:11 AM EDT To: Rivka Barbara Subject: FW: needs post hospital follow-up appt         Actually it may need to be a TOC appt.  Can you tell?  ----- Message ----- From: Nuala Alpha, LPN Sent: 48/27/0786  11:08 AM EDT To: Rivka Barbara Subject: needs post hospital follow-up appt             Per Dr. Meda Coffee, this pt was seen in the hospital and recently discharged.  He will need a call back for post hospital follow-up appt with APP on her team or any APP if team full. Can you pretty please call him and schedule this?   Thanks, [employer]

## 2019-03-22 ENCOUNTER — Encounter (HOSPITAL_COMMUNITY): Payer: Self-pay | Admitting: Radiology

## 2019-03-22 ENCOUNTER — Ambulatory Visit (HOSPITAL_COMMUNITY)
Admission: RE | Admit: 2019-03-22 | Discharge: 2019-03-22 | Disposition: A | Discharge status: Home / Self Care | Payer: Medicare HMO | Source: Ambulatory Visit | Attending: Internal Medicine | Admitting: Internal Medicine

## 2019-03-22 ENCOUNTER — Other Ambulatory Visit: Payer: Self-pay

## 2019-03-22 DIAGNOSIS — F1010 Alcohol abuse, uncomplicated: Secondary | ICD-10-CM | POA: Diagnosis not present

## 2019-03-22 DIAGNOSIS — E871 Hypo-osmolality and hyponatremia: Secondary | ICD-10-CM | POA: Diagnosis not present

## 2019-03-22 DIAGNOSIS — R188 Other ascites: Secondary | ICD-10-CM | POA: Diagnosis not present

## 2019-03-22 DIAGNOSIS — R918 Other nonspecific abnormal finding of lung field: Secondary | ICD-10-CM | POA: Diagnosis not present

## 2019-03-22 DIAGNOSIS — K7460 Unspecified cirrhosis of liver: Secondary | ICD-10-CM | POA: Insufficient documentation

## 2019-03-22 DIAGNOSIS — N183 Chronic kidney disease, stage 3 unspecified: Secondary | ICD-10-CM | POA: Diagnosis not present

## 2019-03-22 DIAGNOSIS — I129 Hypertensive chronic kidney disease with stage 1 through stage 4 chronic kidney disease, or unspecified chronic kidney disease: Secondary | ICD-10-CM | POA: Diagnosis not present

## 2019-03-22 DIAGNOSIS — I4892 Unspecified atrial flutter: Secondary | ICD-10-CM | POA: Diagnosis not present

## 2019-03-22 DIAGNOSIS — I311 Chronic constrictive pericarditis: Secondary | ICD-10-CM | POA: Diagnosis not present

## 2019-03-22 HISTORY — PX: IR PARACENTESIS: IMG2679

## 2019-03-22 MED ORDER — LIDOCAINE HCL 1 % IJ SOLN
INTRAMUSCULAR | Status: AC
  Filled 2019-03-22: qty 20

## 2019-03-22 MED ORDER — LIDOCAINE HCL 1 % IJ SOLN
INTRAMUSCULAR | Status: DC | PRN
  Administered 2019-03-22: 10 mL

## 2019-03-28 NOTE — Progress Notes (Deleted)
Cardiology Office Note    Date:  03/28/2019   ID:  [name], DOB [birth year], MRN [id number]  PCP:  Shon Baton, MD  Cardiologist: Dr. Meda Coffee EP: Dr. [name]  Chief Complaint: Hospital follow up   History of Present Illness:   [name] is a 78 y.o. male a hx of alcoholic liver cirrhosis undergoing paracentesis every two weeks, HTN, atrial flutter s/p remote ablation on BB, hxo fo protal gastropathy with AVMs seen for hospital follow up.  [name] was last seen by [facility] in 2011 with Dr. Stanford Breed. He has a history of nuclear stress test in 2007 which showed a small area of apical ischemia. Echo 2009 with normal EF but diastolic dysfunction. He had an aflutter ablation prior to 2011. In 2011, he was noted to have no recurrences of flutter, but was maintained on toprol for Afib. Repeat nuc in 2010 was unchanged, no evidence of ischemia. Recent echo 2017 with normal EF and normal EF.   Presented to hospital 02/2019 with worsening abdominal distension and SOB. Underwent bilateral thoracentesis and  paracentesis with improved symptoms. His chest CT from June 2020 shows significant diffuse circumferentilal calcification of his pericardium. Echo showed normal LV function with constrictive physiology. Recommended outpatient R & L cath.       Past Medical History:  Diagnosis Date  . Acute respiratory failure with hypoxia ([place]) 03/09/2019  . Adenomatous polyps 06/2004  . Agent orange exposure   . Alcoholic cirrhosis of liver with ascites ([place])   . Alcoholism ([place])   . Allergy   . Anxiety    PTSD- no meds  . Atrial fibrillation ([place])   . Atrial flutter ([place])    had ablation   . Chronic kidney disease    "beginnings of kidney failure"- 3-4 years ago  . Depression   . Diabetes mellitus without complication (HCC)    no meds  . Diverticulosis   . GERD (gastroesophageal reflux disease)   . Hemorrhoids   . Hiatal hernia   . Hx of hernia repair   . Hyperlipidemia   .  Hypertension   . Hyponatremia 08/2018  . Iron deficiency anemia   . Lung nodules    bilateral  . Pedal edema   . Pneumonia    06-2016  . PTSD (post-traumatic stress disorder)   . Substance abuse ([place])    alcohol use  . Ulcer   . Varicose veins   . Wears dentures   . Wears glasses   . Wears hearing aid    B/L    Past Surgical History:  Procedure Laterality Date  . ATRIAL ABLATION SURGERY    . COLONOSCOPY    . FUDUCIAL PLACEMENT N/A 07/26/2018   Procedure: PLACEMENT OF FUDUCIAL;  Surgeon: [name], MD;  Location: [place];  Service: Thoracic;  Laterality: N/A;  . IR PARACENTESIS  10/28/2016  . IR PARACENTESIS  11/12/2016  . IR PARACENTESIS  12/02/2016  . IR PARACENTESIS  12/10/2016  . IR PARACENTESIS  12/30/2016  . IR PARACENTESIS  01/27/2017  . IR PARACENTESIS  09/09/2017  . IR PARACENTESIS  03/04/2018  . IR PARACENTESIS  04/08/2018  . IR PARACENTESIS  05/10/2018  . IR PARACENTESIS  05/21/2018  . IR PARACENTESIS  06/16/2018  . IR PARACENTESIS  07/23/2018  . IR PARACENTESIS  10/06/2018  . IR PARACENTESIS  10/28/2018  . IR PARACENTESIS  01/12/2019  . IR PARACENTESIS  01/28/2019  . IR PARACENTESIS  02/04/2019  .  IR PARACENTESIS  02/15/2019  . IR PARACENTESIS  02/28/2019  . IR PARACENTESIS  03/22/2019  . IR RADIOLOGIST EVAL & MGMT  06/22/2018  . IR THORACENTESIS ASP PLEURAL SPACE W/IMG GUIDE  03/09/2019  . IR THORACENTESIS ASP PLEURAL SPACE W/IMG GUIDE  03/10/2019  . POLYPECTOMY    . THORACENTESIS  03/09/2019  . TONSILLECTOMY    . UMBILICAL HERNIA REPAIR    . VARICOSE VEIN SURGERY     x4  . VIDEO BRONCHOSCOPY WITH ENDOBRONCHIAL NAVIGATION N/A 07/26/2018   Procedure: VIDEO BRONCHOSCOPY WITH ENDOBRONCHIAL NAVIGATION;  Surgeon: [name], MD;  [facility];  Service: Thoracic;  Laterality: N/A;    Current Medications: Prior to Admission medications   Medication Sig Start Date End Date Taking? Authorizing Provider  folic acid (FOLVITE) 1 MG tablet Take 1 tablet (1  mg total) by mouth daily. Patient not taking: Reported on 03/09/2019 09/01/18   Radene Gunning, NP  furosemide (LASIX) 40 MG tablet TAKE 2 TABLETS BY MOUTH IN THE MORNING AND 1 IN THE EVENING Patient taking differently: Take 40 mg by mouth 2 (two) times daily.  02/28/19   Ladene Artist, MD  metoprolol succinate (TOPROL-XL) 25 MG 24 hr tablet Take 1 tablet (25 mg total) by mouth at bedtime as needed (for HBP >130). Patient not taking: Reported on 03/09/2019 08/31/18   Radene Gunning, NP  Multiple Vitamin (MULTIVITAMIN WITH MINERALS) TABS tablet Take 1 tablet by mouth daily. 09/01/18   Black, Lezlie Octave, NP  omeprazole (PRILOSEC) 20 MG capsule Take 1 capsule (20 mg total) by mouth 2 (two) times daily. Patient not taking: Reported on 03/09/2019 03/23/13   Ladene Artist, MD  spironolactone (ALDACTONE) 25 MG tablet Take 1 tablet (25 mg total) by mouth daily. 03/11/19 04/10/19  Antonieta Pert, MD  thiamine 100 MG tablet Take 1 tablet (100 mg total) by mouth daily. 09/01/18   Black, Lezlie Octave, NP    Allergies:   Lisinopril, Lipitor [atorvastatin], and Sulfonamide derivatives   Social History   Socioeconomic History  . Marital status: Single    Spouse name: Not on file  . Number of children: 5  . Years of education: Not on file  . Highest education level: Not on file  Occupational History  . Occupation: retired    Fish farm manager: RETIRED  Social Needs  . Financial resource strain: Not on file  . Food insecurity    Worry: Not on file    Inability: Not on file  . Transportation needs    Medical: Not on file    Non-medical: Not on file  Tobacco Use  . Smoking status: Never Smoker  . Smokeless tobacco: Never Used  Substance and Sexual Activity  . Alcohol use: Yes    Comment: occasaional beer  . Drug use: No  . Sexual activity: Not on file  Lifestyle  . Physical activity    Days per week: Not on file    Minutes per session: Not on file  . Stress: Not on file  Relationships  . Social Product manager on phone: Not on file    Gets together: Not on file    Attends religious service: Not on file    Active member of club or organization: Not on file    Attends meetings of clubs or organizations: Not on file    Relationship status: Not on file  Other Topics Concern  . Not on file  Social History Narrative  . Not  on file     Family History:  The patient's family history includes Colon polyps in his brother; Heart disease in his father; Prostate cancer in his brother; Stroke in his mother. ***  ROS:   Please see the history of present illness.    ROS All other systems reviewed and are negative.   PHYSICAL EXAM:   VS:  There were no vitals taken for this visit.   GEN: Well nourished, well developed, in no acute distress  HEENT: normal  Neck: no JVD, carotid bruits, or masses Cardiac: ***RRR; no murmurs, rubs, or gallops,no edema  Respiratory:  clear to auscultation bilaterally, normal work of breathing GI: soft, nontender, nondistended, + BS MS: no deformity or atrophy  Skin: warm and dry, no rash Neuro:  Alert and Oriented x 3, Strength and sensation are intact Psych: euthymic mood, full affect  Wt Readings from Last 3 Encounters:  03/08/19 190 lb (86.2 kg)  08/31/18 164 lb (74.4 kg)  08/03/18 179 lb (81.2 kg)      Studies/Labs Reviewed:   EKG:  EKG is ordered today.  The ekg ordered today demonstrates ***  Recent Labs: 08/28/2018: TSH 1.317 03/09/2019: B Natriuretic Peptide 303.9; Magnesium 1.9 03/11/2019: ALT 12; BUN 11; Creatinine, Ser 0.67; Hemoglobin 11.7; Platelets 169; Potassium 3.4; Sodium 124   Lipid Panel    Component Value Date/Time   CHOL 143 06/25/2006 0837   TRIG 67 06/25/2006 0837   HDL 32.4 (L) 06/25/2006 0837   CHOLHDL 4.4 CALC 06/25/2006 0837   VLDL 13 06/25/2006 0837   LDLCALC 97 06/25/2006 0837    Additional studies/ records that were reviewed today include:   Echo 03/09/19: 1. Left ventricular ejection fraction, by visual  estimation, is 60 to 65%. The left ventricle has normal function. There is mildly increased left ventricular hypertrophy. 2. Constrictive physiology. 3. Left ventricular diastolic Doppler parameters are indeterminate pattern of LV diastolic filling. 4. Global right ventricle has normal systolic function.The right ventricular size is normal. No increase in right ventricular wall thickness. 5. Left atrial size was normal. 6. Right atrial size was mildly dilated. 7. Moderate ascites is present. 8. Trivial pericardial effusion is present. 9. Mild mitral annular calcification. 10. The mitral valve is normal in structure. Trace mitral valve regurgitation. 11. The tricuspid valve is grossly normal. Tricuspid valve regurgitation is trivial. 12. The aortic valve is abnormal Aortic valve regurgitation was not visualized by color flow Doppler. 13. There is Moderate calcification of the aortic valve. 14. There is Moderate thickening of the aortic valve. 15. The pulmonic valve was grossly normal. Pulmonic valve regurgitation is not visualized by color flow Doppler. 16. Moderately elevated pulmonary artery systolic pressure. 17. The inferior vena cava is dilated in size with <50% respiratory variability, suggesting right atrial pressure of 15 mmHg.     ASSESSMENT & PLAN:    1. Constrictive pericarditis -  Chest CT from June 2020 showed significant diffuse circumferentilal calcification of his pericardium. Echo showed normal LV function with constrictive physiology. Will schedule R & L cath as recommended  2. Alcoholic liver cirrhosis - s/p paracentesis q 2 weeks     Medication Adjustments/Labs and Tests Ordered: Current medicines are reviewed at length with the patient today.  Concerns regarding medicines are outlined above.  Medication changes, Labs and Tests ordered today are listed in the Patient Instructions below. There are no Patient Instructions on file for this visit.   Jarrett Soho, [place]  03/28/2019 8:32 PM  [facility] [address], [place], [place]  [address] Phone: [phone]; Fax: [phone]

## 2019-03-29 ENCOUNTER — Ambulatory Visit: Payer: Medicare HMO | Admitting: Physician Assistant

## 2019-04-01 ENCOUNTER — Other Ambulatory Visit (HOSPITAL_COMMUNITY): Payer: Self-pay | Admitting: Internal Medicine

## 2019-04-01 ENCOUNTER — Other Ambulatory Visit: Payer: Self-pay | Admitting: Internal Medicine

## 2019-04-01 DIAGNOSIS — E871 Hypo-osmolality and hyponatremia: Secondary | ICD-10-CM | POA: Diagnosis not present

## 2019-04-01 DIAGNOSIS — R188 Other ascites: Secondary | ICD-10-CM

## 2019-04-04 ENCOUNTER — Other Ambulatory Visit: Payer: Self-pay

## 2019-04-04 ENCOUNTER — Ambulatory Visit (HOSPITAL_COMMUNITY)
Admission: RE | Admit: 2019-04-04 | Discharge: 2019-04-04 | Disposition: A | Discharge status: Home / Self Care | Payer: Medicare HMO | Source: Ambulatory Visit | Attending: Internal Medicine | Admitting: Internal Medicine

## 2019-04-04 ENCOUNTER — Encounter (HOSPITAL_COMMUNITY): Payer: Self-pay | Admitting: Physician Assistant

## 2019-04-04 DIAGNOSIS — R188 Other ascites: Secondary | ICD-10-CM | POA: Insufficient documentation

## 2019-04-04 HISTORY — PX: IR PARACENTESIS: IMG2679

## 2019-04-04 MED ORDER — LIDOCAINE HCL 1 % IJ SOLN
INTRAMUSCULAR | Status: AC
  Filled 2019-04-04: qty 20

## 2019-04-04 MED ORDER — LIDOCAINE HCL 1 % IJ SOLN
INTRAMUSCULAR | Status: DC | PRN
  Administered 2019-04-04: 15 mL

## 2019-04-04 NOTE — Procedures (Signed)
Ultrasound-guided therapeutic paracentesis performed yielding 3.3 liters of straw colored fluid. No immediate complications. EBL is < 1.

## 2019-04-05 NOTE — Progress Notes (Signed)
Virtual Visit via Telephone Note   This visit type was conducted due to national recommendations for restrictions regarding the COVID-19 Pandemic (e.g. social distancing) in an effort to limit this patient's exposure and mitigate transmission in our community.  Due to his co-morbid illnesses, this patient is at least at moderate risk for complications without adequate follow up.  This format is felt to be most appropriate for this patient at this time.  The patient did not have access to video technology/had technical difficulties with video requiring transitioning to audio format only (telephone).  All issues noted in this document were discussed and addressed.  No physical exam could be performed with this format.  Please refer to the patient's chart for his  consent to telehealth for [facility].   Date:  04/06/2019   ID:  [name], DOB [birth year], MRN [id number]  Patient Location: Home Provider Location: Home  PCP:  Shon Baton, MD  Cardiologist:  Ena Dawley, MD  Electrophysiologist:  None   Evaluation Performed:  Follow-Up Visit  Chief Complaint: Hospital follow up  History of Present Illness:     [name] is a 78 y.o. 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   Mr. Weygandt was last seen by [facility] in 2011 with Dr. Stanford Breed. He has a history of nuclear stress test in 2007 which showed a small area of apical ischemia. Echo 2009 with normal EF but diastolic dysfunction. He had an aflutter ablation prior to 2011. In 2011, he was noted to have no recurrences of flutter, but was maintained on toprol for Afib. Repeat nuc in 2010 was unchanged, no evidence of ischemia.  Admitted 02/2019 with abdominal pain. Felt significantly better after B/L thoracentesis, paracentesis and increasing diuretics dose. He had   significant diffuse circumferential calcification of his pericardium on prior chest CT. Echo with preserved LV function and constrictive physiology.  Diagnosed with constrictive pericarditis.  Recommended outpatient R/L cath.  Seen virtually for follow-up.  No complaint.  About 2 weeks ago he had a mechanical fall and recovered well.  No loss of consciousness.  He has chronic dyspnea on exertion.  He is a non-smoker.  No exertional chest pain.  Denies palpitation, orthopnea, PND, lower extremity edema or melena.  Compliant with his medication.  Last paracentesis 2 days ago.  The patient does not have symptoms concerning for COVID-19 infection (fever, chills, cough, or new shortness of breath).    Past Medical History:  Diagnosis Date  . Acute respiratory failure with hypoxia ([place]) 03/09/2019  . Adenomatous polyps 06/2004  . Agent orange exposure   . Alcoholic cirrhosis of liver with ascites ([place])   . Alcoholism ([place])   . Allergy   . Anxiety    PTSD- no meds  . Atrial fibrillation ([place])   . Atrial flutter ([place])    had ablation   . Chronic kidney disease    "beginnings of kidney failure"- 3-4 years ago  . Depression   . Diabetes mellitus without complication (HCC)    no meds  . Diverticulosis   . GERD (gastroesophageal reflux disease)   . Hemorrhoids   . Hiatal hernia   . Hx of hernia repair   . Hyperlipidemia   . Hypertension   . Hyponatremia 08/2018  . Iron deficiency anemia   . Lung nodules  bilateral  . Pedal edema   . Pneumonia    06-2016  . PTSD (post-traumatic stress disorder)   . Substance abuse ([place])    alcohol use  . Ulcer   . Varicose veins   . Wears dentures   . Wears glasses   . Wears hearing aid    B/L   Past Surgical History:  Procedure Laterality Date  . ATRIAL ABLATION SURGERY    . COLONOSCOPY    . FUDUCIAL PLACEMENT N/A 07/26/2018   Procedure: PLACEMENT OF FUDUCIAL;  Surgeon: [name], MD;  Location: [facility] [place];  Service: Thoracic;   Laterality: N/A;  . IR PARACENTESIS  10/28/2016  . IR PARACENTESIS  11/12/2016  . IR PARACENTESIS  12/02/2016  . IR PARACENTESIS  12/10/2016  . IR PARACENTESIS  12/30/2016  . IR PARACENTESIS  01/27/2017  . IR PARACENTESIS  09/09/2017  . IR PARACENTESIS  03/04/2018  . IR PARACENTESIS  04/08/2018  . IR PARACENTESIS  05/10/2018  . IR PARACENTESIS  05/21/2018  . IR PARACENTESIS  06/16/2018  . IR PARACENTESIS  07/23/2018  . IR PARACENTESIS  10/06/2018  . IR PARACENTESIS  10/28/2018  . IR PARACENTESIS  01/12/2019  . IR PARACENTESIS  01/28/2019  . IR PARACENTESIS  02/04/2019  . IR PARACENTESIS  02/15/2019  . IR PARACENTESIS  02/28/2019  . IR PARACENTESIS  03/22/2019  . IR PARACENTESIS  04/04/2019  . IR RADIOLOGIST EVAL & MGMT  06/22/2018  . IR THORACENTESIS ASP PLEURAL SPACE W/IMG GUIDE  03/09/2019  . IR THORACENTESIS ASP PLEURAL SPACE W/IMG GUIDE  03/10/2019  . POLYPECTOMY    . THORACENTESIS  03/09/2019  . TONSILLECTOMY    . UMBILICAL HERNIA REPAIR    . VARICOSE VEIN SURGERY     x4  . VIDEO BRONCHOSCOPY WITH ENDOBRONCHIAL NAVIGATION N/A 07/26/2018   Procedure: VIDEO BRONCHOSCOPY WITH ENDOBRONCHIAL NAVIGATION;  Surgeon: [name], MD;  Location: [facility];  Service: Thoracic;  Laterality: N/A;     Current Meds  Medication Sig  . folic acid (FOLVITE) 1 MG tablet Take 1 tablet (1 mg total) by mouth daily.  . furosemide (LASIX) 40 MG tablet TAKE 2 TABLETS BY MOUTH IN THE MORNING AND 1 IN THE EVENING (Patient taking differently: Take 40 mg by mouth 2 (two) times daily. )  . Multiple Vitamin (MULTIVITAMIN WITH MINERALS) TABS tablet Take 1 tablet by mouth daily.  Marland Kitchen omeprazole (PRILOSEC) 20 MG capsule Take 1 capsule (20 mg total) by mouth 2 (two) times daily.  Marland Kitchen thiamine 100 MG tablet Take 1 tablet (100 mg total) by mouth daily.  . [DISCONTINUED] spironolactone (ALDACTONE) 100 MG tablet Take 50 mg by mouth daily.     Allergies:   Lisinopril, Lipitor [atorvastatin], and Sulfonamide derivatives    Social History   Tobacco Use  . Smoking status: Never Smoker  . Smokeless tobacco: Never Used  Substance Use Topics  . Alcohol use: Yes    Comment: occasaional beer  . Drug use: No     Family Hx: The patient's family history includes Colon polyps in his brother; Heart disease in his father; Prostate cancer in his brother; Stroke in his mother. There is no history of Colon cancer, Esophageal cancer, Rectal cancer, or Stomach cancer.  ROS:   Please see the history of present illness.    All other systems reviewed and are negative.   Prior CV studies:   The following studies were reviewed today:  Echo 03/11/2019 Echo 03/09/19: 1. Left ventricular ejection fraction,  by visual estimation, is 60 to 65%. The left ventricle has normal function. There is mildly increased left ventricular hypertrophy. 2. Constrictive physiology. 3. Left ventricular diastolic Doppler parameters are indeterminate pattern of LV diastolic filling. 4. Global right ventricle has normal systolic function.The right ventricular size is normal. No increase in right ventricular wall thickness. 5. Left atrial size was normal. 6. Right atrial size was mildly dilated. 7. Moderate ascites is present. 8. Trivial pericardial effusion is present. 9. Mild mitral annular calcification. 10. The mitral valve is normal in structure. Trace mitral valve regurgitation. 11. The tricuspid valve is grossly normal. Tricuspid valve regurgitation is trivial. 12. The aortic valve is abnormal Aortic valve regurgitation was not visualized by color flow Doppler. 13. There is Moderate calcification of the aortic valve. 14. There is Moderate thickening of the aortic valve. 15. The pulmonic valve was grossly normal. Pulmonic valve regurgitation is not visualized by color flow Doppler. 16. Moderately elevated pulmonary artery systolic pressure. 17. The inferior vena cava is dilated in size with <50% respiratory variability,  suggesting right atrial pressure of 15 mmHg.  Labs/Other Tests and Data Reviewed:    EKG:  No ECG reviewed.  Recent Labs: 08/28/2018: TSH 1.317 03/09/2019: B Natriuretic Peptide 303.9; Magnesium 1.9 03/11/2019: ALT 12; BUN 11; Creatinine, Ser 0.67; Hemoglobin 11.7; Platelets 169; Potassium 3.4; Sodium 124   Recent Lipid Panel Lab Results  Component Value Date/Time   CHOL 143 06/25/2006 08:37 AM   TRIG 67 06/25/2006 08:37 AM   HDL 32.4 (L) 06/25/2006 08:37 AM   CHOLHDL 4.4 CALC 06/25/2006 08:37 AM   LDLCALC 97 06/25/2006 08:37 AM    Wt Readings from Last 3 Encounters:  04/06/19 195 lb (88.5 kg)  03/08/19 190 lb (86.2 kg)  08/31/18 164 lb (74.4 kg)     Objective:    Vital Signs:  BP 110/66   Pulse 89   Ht 5\' 7"  (1.702 m)   Wt 195 lb (88.5 kg)   BMI 30.54 kg/m    VITAL SIGNS:  reviewed GEN:  no acute distress PSYCH:  normal affect  ASSESSMENT & PLAN:    1. Constrictive pericarditis - Based on chest CT and echo physiology. Normal LVEF.  Patient has chronic shortness of breath with exertion without chest pain.  This is stable.  Will set up right and left heart cath as recommended by Dr. Meda Coffee. The patient understands that risks include but are not limited to stroke (1 in 1000), death (1 in 44), kidney failure [usually temporary] (1 in 500), bleeding (1 in 200), allergic reaction [possibly serious] (1 in 200), and agrees to proceed.   2. Atrial flutter s/p remote ablation -No palpitation.  3. Alcoholic liver cirrhosis with scheduled paracentesis every 2 weeks -Last treatment 11/16  4.  Chronic diastolic heart failure -Denies orthopnea or edema. -Preserved LV function by echocardiogram as above. -Continue Lasix and spironolactone.  COVID-19 Education: The signs and symptoms of COVID-19 were discussed with the patient and how to seek care for testing (follow up with PCP or arrange E-visit).  The importance of social distancing was discussed today.  Time:    Today, I have spent 17 minutes with the patient with telehealth technology discussing the above problems.     Medication Adjustments/Labs and Tests Ordered: Current medicines are reviewed at length with the patient today.  Concerns regarding medicines are outlined above.   Tests Ordered: Orders Placed This Encounter  Procedures  . CBC  . Basic Metabolic Panel (BMET)  Medication Changes: No orders of the defined types were placed in this encounter.   Follow Up:  Either In Person or Virtual in 1 month(s)  Signed, Leanor Kail, PA  04/06/2019 2:19 PM    [facility]

## 2019-04-05 NOTE — H&P (View-Only) (Signed)
Virtual Visit via Telephone Note   This visit type was conducted due to national recommendations for restrictions regarding the COVID-19 Pandemic (e.g. social distancing) in an effort to limit this patient's exposure and mitigate transmission in our community.  Due to his co-morbid illnesses, this patient is at least at moderate risk for complications without adequate follow up.  This format is felt to be most appropriate for this patient at this time.  The patient did not have access to video technology/had technical difficulties with video requiring transitioning to audio format only (telephone).  All issues noted in this document were discussed and addressed.  No physical exam could be performed with this format.  Please refer to the patient's chart for his  consent to telehealth for [facility].   Date:  04/06/2019   ID:  [name], DOB Dec 07, 1940, MRN [id number]  Patient Location: Home Provider Location: Home  PCP:  Shon Baton, MD  Cardiologist:  Ena Dawley, MD  Electrophysiologist:  None   Evaluation Performed:  Follow-Up Visit  Chief Complaint: Hospital follow up  History of Present Illness:     [name] is a 78 y.o. 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   [name] was last seen by [facility] in 2011 with Dr. Stanford Breed. He has a history of nuclear stress test in 2007 which showed a small area of apical ischemia. Echo 2009 with normal EF but diastolic dysfunction. He had an aflutter ablation prior to 2011. In 2011, he was noted to have no recurrences of flutter, but was maintained on toprol for Afib. Repeat nuc in 2010 was unchanged, no evidence of ischemia.  Admitted 02/2019 with abdominal pain. Felt significantly better after B/L thoracentesis, paracentesis and increasing diuretics dose. He had   significant diffuse circumferential calcification of his pericardium on prior chest CT. Echo with preserved LV function and constrictive physiology.  Diagnosed with constrictive pericarditis.  Recommended outpatient R/L cath.  Seen virtually for follow-up.  No complaint.  About 2 weeks ago he had a mechanical fall and recovered well.  No loss of consciousness.  He has chronic dyspnea on exertion.  He is a non-smoker.  No exertional chest pain.  Denies palpitation, orthopnea, PND, lower extremity edema or melena.  Compliant with his medication.  Last paracentesis 2 days ago.  The patient does not have symptoms concerning for COVID-19 infection (fever, chills, cough, or new shortness of breath).    Past Medical History:  Diagnosis Date  . Acute respiratory failure with hypoxia ([place]) 03/09/2019  . Adenomatous polyps 06/2004  . Agent orange exposure   . Alcoholic cirrhosis of liver with ascites ([place])   . Alcoholism ([place])   . Allergy   . Anxiety    PTSD- no meds  . Atrial fibrillation ([place])   . Atrial flutter ([place])    had ablation   . Chronic kidney disease    "beginnings of kidney failure"- 3-4 years ago  . Depression   . Diabetes mellitus without complication (HCC)    no meds  . Diverticulosis   . GERD (gastroesophageal reflux disease)   . Hemorrhoids   . Hiatal hernia   . Hx of hernia repair   . Hyperlipidemia   . Hypertension   . Hyponatremia 08/2018  . Iron deficiency anemia   . Lung nodules  bilateral  . Pedal edema   . Pneumonia    06-2016  . PTSD (post-traumatic stress disorder)   . Substance abuse ([place])    alcohol use  . Ulcer   . Varicose veins   . Wears dentures   . Wears glasses   . Wears hearing aid    B/L   Past Surgical History:  Procedure Laterality Date  . ATRIAL ABLATION SURGERY    . COLONOSCOPY    . FUDUCIAL PLACEMENT N/A 07/26/2018   Procedure: PLACEMENT OF FUDUCIAL;  Surgeon: [name], MD;  Location: [place];  Service: Thoracic;   Laterality: N/A;  . IR PARACENTESIS  10/28/2016  . IR PARACENTESIS  11/12/2016  . IR PARACENTESIS  12/02/2016  . IR PARACENTESIS  12/10/2016  . IR PARACENTESIS  12/30/2016  . IR PARACENTESIS  01/27/2017  . IR PARACENTESIS  09/09/2017  . IR PARACENTESIS  03/04/2018  . IR PARACENTESIS  04/08/2018  . IR PARACENTESIS  05/10/2018  . IR PARACENTESIS  05/21/2018  . IR PARACENTESIS  06/16/2018  . IR PARACENTESIS  07/23/2018  . IR PARACENTESIS  10/06/2018  . IR PARACENTESIS  10/28/2018  . IR PARACENTESIS  01/12/2019  . IR PARACENTESIS  01/28/2019  . IR PARACENTESIS  02/04/2019  . IR PARACENTESIS  02/15/2019  . IR PARACENTESIS  02/28/2019  . IR PARACENTESIS  03/22/2019  . IR PARACENTESIS  04/04/2019  . IR RADIOLOGIST EVAL & MGMT  06/22/2018  . IR THORACENTESIS ASP PLEURAL SPACE W/IMG GUIDE  03/09/2019  . IR THORACENTESIS ASP PLEURAL SPACE W/IMG GUIDE  03/10/2019  . POLYPECTOMY    . THORACENTESIS  03/09/2019  . TONSILLECTOMY    . UMBILICAL HERNIA REPAIR    . VARICOSE VEIN SURGERY     x4  . VIDEO BRONCHOSCOPY WITH ENDOBRONCHIAL NAVIGATION N/A 07/26/2018   Procedure: VIDEO BRONCHOSCOPY WITH ENDOBRONCHIAL NAVIGATION;  Surgeon: [name], MD;  Location: [facility] OR;  Service: Thoracic;  Laterality: N/A;     Current Meds  Medication Sig  . folic acid (FOLVITE) 1 MG tablet Take 1 tablet (1 mg total) by mouth daily.  . furosemide (LASIX) 40 MG tablet TAKE 2 TABLETS BY MOUTH IN THE MORNING AND 1 IN THE EVENING (Patient taking differently: Take 40 mg by mouth 2 (two) times daily. )  . Multiple Vitamin (MULTIVITAMIN WITH MINERALS) TABS tablet Take 1 tablet by mouth daily.  Marland Kitchen omeprazole (PRILOSEC) 20 MG capsule Take 1 capsule (20 mg total) by mouth 2 (two) times daily.  Marland Kitchen thiamine 100 MG tablet Take 1 tablet (100 mg total) by mouth daily.  . [DISCONTINUED] spironolactone (ALDACTONE) 100 MG tablet Take 50 mg by mouth daily.     Allergies:   Lisinopril, Lipitor [atorvastatin], and Sulfonamide derivatives    Social History   Tobacco Use  . Smoking status: Never Smoker  . Smokeless tobacco: Never Used  Substance Use Topics  . Alcohol use: Yes    Comment: occasaional beer  . Drug use: No     Family Hx: The patient's family history includes Colon polyps in his brother; Heart disease in his father; Prostate cancer in his brother; Stroke in his mother. There is no history of Colon cancer, Esophageal cancer, Rectal cancer, or Stomach cancer.  ROS:   Please see the history of present illness.    All other systems reviewed and are negative.   Prior CV studies:   The following studies were reviewed today:  Echo 03/11/2019 Echo 03/09/19: 1. Left ventricular ejection fraction,  by visual estimation, is 60 to 65%. The left ventricle has normal function. There is mildly increased left ventricular hypertrophy. 2. Constrictive physiology. 3. Left ventricular diastolic Doppler parameters are indeterminate pattern of LV diastolic filling. 4. Global right ventricle has normal systolic function.The right ventricular size is normal. No increase in right ventricular wall thickness. 5. Left atrial size was normal. 6. Right atrial size was mildly dilated. 7. Moderate ascites is present. 8. Trivial pericardial effusion is present. 9. Mild mitral annular calcification. 10. The mitral valve is normal in structure. Trace mitral valve regurgitation. 11. The tricuspid valve is grossly normal. Tricuspid valve regurgitation is trivial. 12. The aortic valve is abnormal Aortic valve regurgitation was not visualized by color flow Doppler. 13. There is Moderate calcification of the aortic valve. 14. There is Moderate thickening of the aortic valve. 15. The pulmonic valve was grossly normal. Pulmonic valve regurgitation is not visualized by color flow Doppler. 16. Moderately elevated pulmonary artery systolic pressure. 17. The inferior vena cava is dilated in size with <50% respiratory variability,  suggesting right atrial pressure of 15 mmHg.  Labs/Other Tests and Data Reviewed:    EKG:  No ECG reviewed.  Recent Labs: 08/28/2018: TSH 1.317 03/09/2019: B Natriuretic Peptide 303.9; Magnesium 1.9 03/11/2019: ALT 12; BUN 11; Creatinine, Ser 0.67; Hemoglobin 11.7; Platelets 169; Potassium 3.4; Sodium 124   Recent Lipid Panel Lab Results  Component Value Date/Time   CHOL 143 06/25/2006 08:37 AM   TRIG 67 06/25/2006 08:37 AM   HDL 32.4 (L) 06/25/2006 08:37 AM   CHOLHDL 4.4 CALC 06/25/2006 08:37 AM   LDLCALC 97 06/25/2006 08:37 AM    Wt Readings from Last 3 Encounters:  04/06/19 195 lb (88.5 kg)  03/08/19 190 lb (86.2 kg)  08/31/18 164 lb (74.4 kg)     Objective:    Vital Signs:  BP 110/66   Pulse 89   Ht 5\' 7"  (1.702 m)   Wt 195 lb (88.5 kg)   BMI 30.54 kg/m    VITAL SIGNS:  reviewed GEN:  no acute distress PSYCH:  normal affect  ASSESSMENT & PLAN:    1. Constrictive pericarditis - Based on chest CT and echo physiology. Normal LVEF.  Patient has chronic shortness of breath with exertion without chest pain.  This is stable.  Will set up right and left heart cath as recommended by Dr. Meda Coffee. The patient understands that risks include but are not limited to stroke (1 in 1000), death (1 in 61), kidney failure [usually temporary] (1 in 500), bleeding (1 in 200), allergic reaction [possibly serious] (1 in 200), and agrees to proceed.   2. Atrial flutter s/p remote ablation -No palpitation.  3. Alcoholic liver cirrhosis with scheduled paracentesis every 2 weeks -Last treatment 11/16  4.  Chronic diastolic heart failure -Denies orthopnea or edema. -Preserved LV function by echocardiogram as above. -Continue Lasix and spironolactone.  COVID-19 Education: The signs and symptoms of COVID-19 were discussed with the patient and how to seek care for testing (follow up with PCP or arrange E-visit).  The importance of social distancing was discussed today.  Time:    Today, I have spent 17 minutes with the patient with telehealth technology discussing the above problems.     Medication Adjustments/Labs and Tests Ordered: Current medicines are reviewed at length with the patient today.  Concerns regarding medicines are outlined above.   Tests Ordered: Orders Placed This Encounter  Procedures  . CBC  . Basic Metabolic Panel (BMET)  Medication Changes: No orders of the defined types were placed in this encounter.   Follow Up:  Either In Person or Virtual in 1 month(s)  Signed, Leanor Kail, PA  04/06/2019 2:19 PM    [facility]

## 2019-04-06 ENCOUNTER — Encounter: Payer: Self-pay | Admitting: *Deleted

## 2019-04-06 ENCOUNTER — Telehealth: Payer: Self-pay | Admitting: *Deleted

## 2019-04-06 ENCOUNTER — Other Ambulatory Visit: Payer: Self-pay

## 2019-04-06 ENCOUNTER — Telehealth (INDEPENDENT_AMBULATORY_CARE_PROVIDER_SITE_OTHER): Payer: Medicare HMO | Admitting: Physician Assistant

## 2019-04-06 VITALS — BP 110/66 | HR 89 | Ht 67.0 in | Wt 195.0 lb

## 2019-04-06 DIAGNOSIS — I311 Chronic constrictive pericarditis: Secondary | ICD-10-CM

## 2019-04-06 DIAGNOSIS — I4892 Unspecified atrial flutter: Secondary | ICD-10-CM

## 2019-04-06 DIAGNOSIS — K7031 Alcoholic cirrhosis of liver with ascites: Secondary | ICD-10-CM

## 2019-04-06 DIAGNOSIS — I5032 Chronic diastolic (congestive) heart failure: Secondary | ICD-10-CM

## 2019-04-06 NOTE — Patient Instructions (Addendum)
Medication Instructions:  Your physician recommends that you continue on your current medications as directed. Please refer to the Current Medication list given to you today.  *If you need a refill on your cardiac medications before your next appointment, please call your pharmacy*  Lab Work: You will need to have lab work done prior to procedure.  If you have labs (blood work) drawn today and your tests are completely normal, you will receive your results only by: Marland Kitchen MyChart Message (if you have MyChart) OR . A paper copy in the mail If you have any lab test that is abnormal or we need to change your treatment, we will call you to review the results.  Testing/Procedures: Your physician has requested that you have a cardiac catheterization. Cardiac catheterization is used to diagnose and/or treat various heart conditions. Doctors may recommend this procedure for a number of different reasons. The most common reason is to evaluate chest pain. Chest pain can be a symptom of coronary artery disease (CAD), and cardiac catheterization can show whether plaque is narrowing or blocking your heart's arteries. This procedure is also used to evaluate the valves, as well as measure the blood flow and oxygen levels in different parts of your heart. For further information please visit [email or web address].      Follow-Up: To be arranged after catheterization.

## 2019-04-06 NOTE — Telephone Encounter (Signed)
Follow Up:   She said pt can do his COVID Test on 04-18-19 and his procedure was 04-21-19.

## 2019-04-06 NOTE — Telephone Encounter (Addendum)
I spoke with pt after his virtual visit with B Bhagat, PA today and went over cath instructions with him.  He asked I contact his daughter in [place] [phone] to go over instructions.  I spoke with [name] who reports no one will be able to bring pt to the procedure on 11/24 or stay with him after.  She requests cath be changed to December 3rd. Pt has paracentesis every 2 weeks.  Last done on 11/16.  [name] aware pt will need to quarantine after covid testing until time of cath. She will check on timing of next paracentesis and call us back.  Cath, labs and covid testing can then be rescheduled. [name] will make pt aware of this information.   Updated instructions for pt will need to be left downstairs at screening table for him to pick up when he comes in for lab work.  Pt will need to hold lasix and spironolactone day of cath

## 2019-04-06 NOTE — Telephone Encounter (Signed)
I scheduled cath for 04/21/19 at 9:00 with Dr Angelena Form.  I spoke with pt's daughter in law [name]) who reports pt will not have paracentesis done week of 11/30.  He will wait until after cath on 12/3 to have done. I verbally went over all instructions with [name] and will leave copy at screening table for pt to pick up when he comes to office for lab work on 11/30. [name] aware pt must have lab work done first and then go to [employer] for [employer].

## 2019-04-08 ENCOUNTER — Other Ambulatory Visit: Payer: Medicare HMO

## 2019-04-08 ENCOUNTER — Other Ambulatory Visit (HOSPITAL_COMMUNITY): Payer: Medicare HMO

## 2019-04-09 ENCOUNTER — Other Ambulatory Visit: Payer: Self-pay | Admitting: Gastroenterology

## 2019-04-11 ENCOUNTER — Other Ambulatory Visit: Payer: Self-pay | Admitting: Internal Medicine

## 2019-04-11 ENCOUNTER — Other Ambulatory Visit (HOSPITAL_COMMUNITY): Payer: Self-pay | Admitting: Internal Medicine

## 2019-04-11 DIAGNOSIS — K7031 Alcoholic cirrhosis of liver with ascites: Secondary | ICD-10-CM

## 2019-04-12 ENCOUNTER — Encounter (HOSPITAL_COMMUNITY): Payer: Self-pay

## 2019-04-12 ENCOUNTER — Ambulatory Visit (HOSPITAL_COMMUNITY): Admit: 2019-04-12 | Payer: Medicare HMO | Admitting: Cardiology

## 2019-04-12 SURGERY — RIGHT/LEFT HEART CATH AND CORONARY ANGIOGRAPHY
Anesthesia: LOCAL

## 2019-04-15 ENCOUNTER — Ambulatory Visit (HOSPITAL_COMMUNITY)
Admission: RE | Admit: 2019-04-15 | Discharge: 2019-04-15 | Disposition: A | Discharge status: Home / Self Care | Payer: Medicare HMO | Source: Ambulatory Visit | Attending: Internal Medicine | Admitting: Internal Medicine

## 2019-04-15 ENCOUNTER — Encounter (HOSPITAL_COMMUNITY): Payer: Self-pay | Admitting: Radiology

## 2019-04-15 ENCOUNTER — Other Ambulatory Visit: Payer: Self-pay

## 2019-04-15 DIAGNOSIS — R188 Other ascites: Secondary | ICD-10-CM | POA: Diagnosis not present

## 2019-04-15 DIAGNOSIS — K7031 Alcoholic cirrhosis of liver with ascites: Secondary | ICD-10-CM | POA: Diagnosis not present

## 2019-04-15 HISTORY — PX: IR PARACENTESIS: IMG2679

## 2019-04-15 MED ORDER — LIDOCAINE HCL (PF) 1 % IJ SOLN
INTRAMUSCULAR | Status: DC | PRN
  Administered 2019-04-15: 10 mL

## 2019-04-15 MED ORDER — LIDOCAINE HCL 1 % IJ SOLN
INTRAMUSCULAR | Status: AC
  Filled 2019-04-15: qty 20

## 2019-04-15 NOTE — Procedures (Signed)
PROCEDURE SUMMARY:  Successful US guided paracentesis from LLQ.  Yielded 2.6 L of hazy yellow fluid.  No immediate complications.  Pt tolerated well.   Specimen was not sent for labs.  EBL < 18mL  Ascencion Dike PA-C 04/15/2019 2:45 PM

## 2019-04-18 ENCOUNTER — Other Ambulatory Visit: Payer: Self-pay

## 2019-04-18 ENCOUNTER — Other Ambulatory Visit: Payer: Medicare HMO | Admitting: *Deleted

## 2019-04-18 ENCOUNTER — Other Ambulatory Visit (HOSPITAL_COMMUNITY)
Admission: RE | Admit: 2019-04-18 | Discharge: 2019-04-18 | Disposition: A | Discharge status: Home / Self Care | Payer: Medicare HMO | Source: Ambulatory Visit | Attending: Cardiovascular Disease | Admitting: Cardiovascular Disease

## 2019-04-18 DIAGNOSIS — I311 Chronic constrictive pericarditis: Secondary | ICD-10-CM

## 2019-04-18 DIAGNOSIS — Z20828 Contact with and (suspected) exposure to other viral communicable diseases: Secondary | ICD-10-CM | POA: Insufficient documentation

## 2019-04-18 DIAGNOSIS — Z01812 Encounter for preprocedural laboratory examination: Secondary | ICD-10-CM | POA: Diagnosis not present

## 2019-04-18 DIAGNOSIS — I4892 Unspecified atrial flutter: Secondary | ICD-10-CM | POA: Diagnosis not present

## 2019-04-18 DIAGNOSIS — I5032 Chronic diastolic (congestive) heart failure: Secondary | ICD-10-CM

## 2019-04-18 DIAGNOSIS — K7031 Alcoholic cirrhosis of liver with ascites: Secondary | ICD-10-CM | POA: Diagnosis not present

## 2019-04-18 LAB — BASIC METABOLIC PANEL
BUN/Creatinine Ratio: 13 (ref 10–24)
BUN: 9 mg/dL (ref 8–27)
CO2: 31 mmol/L — ABNORMAL HIGH (ref 20–29)
Calcium: 8.5 mg/dL — ABNORMAL LOW (ref 8.6–10.2)
Chloride: 84 mmol/L — ABNORMAL LOW (ref 96–106)
Creatinine, Ser: 0.72 mg/dL — ABNORMAL LOW (ref 0.76–1.27)
GFR calc Af Amer: 105 mL/min/{1.73_m2} (ref >59)
GFR calc non Af Amer: 91 mL/min/{1.73_m2} (ref >59)
Glucose: 140 mg/dL — ABNORMAL HIGH (ref 65–99)
Potassium: 3.3 mmol/L — ABNORMAL LOW (ref 3.5–5.2)
Sodium: 126 mmol/L — ABNORMAL LOW (ref 134–144)

## 2019-04-18 LAB — CBC
Hematocrit: 39.1 % (ref 37.5–51.0)
Hemoglobin: 13.2 g/dL (ref 13.0–17.7)
MCH: 29.5 pg (ref 26.6–33.0)
MCHC: 33.8 g/dL (ref 31.5–35.7)
MCV: 88 fL (ref 79–97)
Platelets: 234 10*3/uL (ref 150–450)
RBC: 4.47 x10E6/uL (ref 4.14–5.80)
RDW: 16.9 % — ABNORMAL HIGH (ref 11.6–15.4)
WBC: 4.1 10*3/uL (ref 3.4–10.8)

## 2019-04-19 ENCOUNTER — Other Ambulatory Visit: Payer: Self-pay | Admitting: *Deleted

## 2019-04-19 DIAGNOSIS — E876 Hypokalemia: Secondary | ICD-10-CM

## 2019-04-19 LAB — NOVEL CORONAVIRUS, NAA (HOSP ORDER, SEND-OUT TO REF LAB; TAT 18-24 HRS): SARS-CoV-2, NAA: NOT DETECTED

## 2019-04-19 MED ORDER — POTASSIUM CHLORIDE ER 10 MEQ PO TBCR: 10.0000 meq | EXTENDED_RELEASE_TABLET | Freq: Every day | ORAL | 11 refills | Status: DC

## 2019-04-20 ENCOUNTER — Telehealth: Payer: Self-pay | Admitting: *Deleted

## 2019-04-20 NOTE — Telephone Encounter (Signed)
Pt contacted pre-catheterization scheduled at [facility] for: Thursday April 21, 2019 9 AM Verified arrival time and place: [facility]) at: 7 AM   No solid food after midnight prior to cath, clear liquids until 5 AM day of procedure. Contrast allergy: no  Hold: Lasix-AM of procedure. Spironolactone-AM of procedure.  Except hold medications AM meds can be  taken pre-cath with sip of water including: ASA 81 mg   Confirmed patient has responsible adult to drive home post procedure and observe 24 hours after arriving home: yes  Currently, due to Covid-19 pandemic, only one support person will be allowed with patient. Must be the same support person for that patient's entire stay, will be screened and required to wear a mask. They will be asked to wait in the waiting room for the duration of the patient's stay.  Patients are required to wear a mask when they enter the hospital.      COVID-19 Pre-Screening Questions:  . In the past 7 to 10 days have you had a cough,  shortness of breath, headache, congestion, fever (100 or greater) body aches, chills, sore throat, or sudden loss of taste or sense of smell? no . Have you been around anyone with known Covid 19? no . Have you been around anyone who is awaiting Covid 19 test results in the past 7 to 10 days? no . Have you been around anyone who has been exposed to Covid 19, or has mentioned symptoms of Covid 19 within the past 7 to 10 days? no    I reviewed procedure/mask/visitor instructions, Covid-19 screening questions with patient, he verbalized understanding, thanked me for call.

## 2019-04-21 ENCOUNTER — Ambulatory Visit (HOSPITAL_COMMUNITY)
Admission: RE | Admit: 2019-04-21 | Discharge: 2019-04-21 | Disposition: A | Discharge status: Home / Self Care | Payer: Medicare HMO | Attending: Cardiovascular Disease | Admitting: Cardiovascular Disease

## 2019-04-21 ENCOUNTER — Encounter (HOSPITAL_COMMUNITY)
Admission: RE | Disposition: A | Discharge status: Home / Self Care | Payer: Medicare HMO | Source: Home / Self Care | Attending: Cardiovascular Disease

## 2019-04-21 ENCOUNTER — Other Ambulatory Visit: Payer: Self-pay

## 2019-04-21 DIAGNOSIS — I2510 Atherosclerotic heart disease of native coronary artery without angina pectoris: Secondary | ICD-10-CM | POA: Insufficient documentation

## 2019-04-21 DIAGNOSIS — I4891 Unspecified atrial fibrillation: Secondary | ICD-10-CM | POA: Insufficient documentation

## 2019-04-21 DIAGNOSIS — K7030 Alcoholic cirrhosis of liver without ascites: Secondary | ICD-10-CM | POA: Diagnosis not present

## 2019-04-21 DIAGNOSIS — E785 Hyperlipidemia, unspecified: Secondary | ICD-10-CM | POA: Diagnosis not present

## 2019-04-21 DIAGNOSIS — K219 Gastro-esophageal reflux disease without esophagitis: Secondary | ICD-10-CM | POA: Diagnosis not present

## 2019-04-21 DIAGNOSIS — E1122 Type 2 diabetes mellitus with diabetic chronic kidney disease: Secondary | ICD-10-CM | POA: Insufficient documentation

## 2019-04-21 DIAGNOSIS — I311 Chronic constrictive pericarditis: Secondary | ICD-10-CM | POA: Diagnosis present

## 2019-04-21 DIAGNOSIS — I5032 Chronic diastolic (congestive) heart failure: Secondary | ICD-10-CM | POA: Diagnosis not present

## 2019-04-21 DIAGNOSIS — I4892 Unspecified atrial flutter: Secondary | ICD-10-CM | POA: Insufficient documentation

## 2019-04-21 DIAGNOSIS — Z882 Allergy status to sulfonamides status: Secondary | ICD-10-CM | POA: Diagnosis not present

## 2019-04-21 DIAGNOSIS — D509 Iron deficiency anemia, unspecified: Secondary | ICD-10-CM | POA: Diagnosis not present

## 2019-04-21 DIAGNOSIS — I130 Hypertensive heart and chronic kidney disease with heart failure and stage 1 through stage 4 chronic kidney disease, or unspecified chronic kidney disease: Secondary | ICD-10-CM | POA: Diagnosis not present

## 2019-04-21 DIAGNOSIS — N189 Chronic kidney disease, unspecified: Secondary | ICD-10-CM | POA: Diagnosis not present

## 2019-04-21 DIAGNOSIS — R0602 Shortness of breath: Secondary | ICD-10-CM | POA: Diagnosis not present

## 2019-04-21 DIAGNOSIS — Z79899 Other long term (current) drug therapy: Secondary | ICD-10-CM | POA: Insufficient documentation

## 2019-04-21 DIAGNOSIS — Z888 Allergy status to other drugs, medicaments and biological substances status: Secondary | ICD-10-CM | POA: Insufficient documentation

## 2019-04-21 DIAGNOSIS — F329 Major depressive disorder, single episode, unspecified: Secondary | ICD-10-CM | POA: Insufficient documentation

## 2019-04-21 HISTORY — PX: RIGHT/LEFT HEART CATH AND CORONARY ANGIOGRAPHY: CATH118266

## 2019-04-21 LAB — POCT I-STAT EG7
Acid-Base Excess: 5 mmol/L — ABNORMAL HIGH (ref 0.0–2.0)
Bicarbonate: 30.3 mmol/L — ABNORMAL HIGH (ref 20.0–28.0)
Calcium, Ion: 1.07 mmol/L — ABNORMAL LOW (ref 1.15–1.40)
HCT: 36 % — ABNORMAL LOW (ref 39.0–52.0)
Hemoglobin: 12.2 g/dL — ABNORMAL LOW (ref 13.0–17.0)
O2 Saturation: 77 %
Potassium: 3.5 mmol/L (ref 3.5–5.1)
Sodium: 129 mmol/L — ABNORMAL LOW (ref 135–145)
TCO2: 32 mmol/L (ref 22–32)
pCO2, Ven: 48.2 mmHg (ref 44.0–60.0)
pH, Ven: 7.406 (ref 7.250–7.430)
pO2, Ven: 42 mmHg (ref 32.0–45.0)

## 2019-04-21 LAB — POCT I-STAT 7, (LYTES, BLD GAS, ICA,H+H)
Acid-Base Excess: 6 mmol/L — ABNORMAL HIGH (ref 0.0–2.0)
Bicarbonate: 31 mmol/L — ABNORMAL HIGH (ref 20.0–28.0)
Calcium, Ion: 1.11 mmol/L — ABNORMAL LOW (ref 1.15–1.40)
HCT: 36 % — ABNORMAL LOW (ref 39.0–52.0)
Hemoglobin: 12.2 g/dL — ABNORMAL LOW (ref 13.0–17.0)
O2 Saturation: 100 %
Potassium: 3.5 mmol/L (ref 3.5–5.1)
Sodium: 127 mmol/L — ABNORMAL LOW (ref 135–145)
TCO2: 32 mmol/L (ref 22–32)
pCO2 arterial: 45.5 mmHg (ref 32.0–48.0)
pH, Arterial: 7.442 (ref 7.350–7.450)
pO2, Arterial: 165 mmHg — ABNORMAL HIGH (ref 83.0–108.0)

## 2019-04-21 LAB — GLUCOSE, CAPILLARY: Glucose-Capillary: 112 mg/dL — ABNORMAL HIGH (ref 70–99)

## 2019-04-21 LAB — BASIC METABOLIC PANEL
Anion gap: 12 (ref 5–15)
BUN: 8 mg/dL (ref 8–23)
CO2: 30 mmol/L (ref 22–32)
Calcium: 8.2 mg/dL — ABNORMAL LOW (ref 8.9–10.3)
Chloride: 86 mmol/L — ABNORMAL LOW (ref 98–111)
Creatinine, Ser: 0.75 mg/dL (ref 0.61–1.24)
GFR calc Af Amer: >60 mL/min (ref >60)
GFR calc non Af Amer: >60 mL/min (ref >60)
Glucose, Bld: 126 mg/dL — ABNORMAL HIGH (ref 70–99)
Potassium: 3.1 mmol/L — ABNORMAL LOW (ref 3.5–5.1)
Sodium: 128 mmol/L — ABNORMAL LOW (ref 135–145)

## 2019-04-21 SURGERY — RIGHT/LEFT HEART CATH AND CORONARY ANGIOGRAPHY
Anesthesia: LOCAL

## 2019-04-21 MED ORDER — MIDAZOLAM HCL 2 MG/2ML IJ SOLN
INTRAMUSCULAR | Status: AC
  Filled 2019-04-21: qty 2

## 2019-04-21 MED ORDER — SODIUM CHLORIDE 0.9% FLUSH: 3.0000 mL | INTRAVENOUS | Status: DC | PRN

## 2019-04-21 MED ORDER — VERAPAMIL HCL 2.5 MG/ML IV SOLN
INTRAVENOUS | Status: DC | PRN
  Administered 2019-04-21: 10 mL via INTRA_ARTERIAL

## 2019-04-21 MED ORDER — HEPARIN SODIUM (PORCINE) 1000 UNIT/ML IJ SOLN
INTRAMUSCULAR | Status: AC
  Filled 2019-04-21: qty 1

## 2019-04-21 MED ORDER — POTASSIUM CHLORIDE CRYS ER 20 MEQ PO TBCR
40.0000 meq | EXTENDED_RELEASE_TABLET | ORAL | Status: AC
  Administered 2019-04-21: 40 meq via ORAL
  Filled 2019-04-21: qty 2

## 2019-04-21 MED ORDER — FENTANYL CITRATE (PF) 100 MCG/2ML IJ SOLN
INTRAMUSCULAR | Status: DC | PRN
  Administered 2019-04-21: 12.5 ug via INTRAVENOUS

## 2019-04-21 MED ORDER — POTASSIUM CHLORIDE ER 10 MEQ PO TBCR: 20.0000 meq | EXTENDED_RELEASE_TABLET | Freq: Every day | ORAL | 5 refills | Status: DC

## 2019-04-21 MED ORDER — LIDOCAINE HCL (PF) 1 % IJ SOLN
INTRAMUSCULAR | Status: DC | PRN
  Administered 2019-04-21: 2 mL

## 2019-04-21 MED ORDER — LIDOCAINE HCL (PF) 1 % IJ SOLN
INTRAMUSCULAR | Status: AC
  Filled 2019-04-21: qty 30

## 2019-04-21 MED ORDER — MIDAZOLAM HCL 2 MG/2ML IJ SOLN
INTRAMUSCULAR | Status: DC | PRN
  Administered 2019-04-21: 0.5 mg via INTRAVENOUS

## 2019-04-21 MED ORDER — SODIUM CHLORIDE 0.9 % IV SOLN: 250.0000 mL | INTRAVENOUS | Status: DC | PRN

## 2019-04-21 MED ORDER — SODIUM CHLORIDE 0.9% FLUSH: 3.0000 mL | Freq: Two times a day (BID) | INTRAVENOUS | Status: DC

## 2019-04-21 MED ORDER — IOHEXOL 350 MG/ML SOLN
INTRAVENOUS | Status: DC | PRN
  Administered 2019-04-21: 45 mL

## 2019-04-21 MED ORDER — SODIUM CHLORIDE 0.9 % WEIGHT BASED INFUSION: 1.0000 mL/kg/h | INTRAVENOUS | Status: DC

## 2019-04-21 MED ORDER — TORSEMIDE 20 MG PO TABS: 40.0000 mg | ORAL_TABLET | Freq: Two times a day (BID) | ORAL | 5 refills | Status: DC

## 2019-04-21 MED ORDER — HEPARIN SODIUM (PORCINE) 1000 UNIT/ML IJ SOLN
INTRAMUSCULAR | Status: DC | PRN
  Administered 2019-04-21: 4000 [IU] via INTRAVENOUS

## 2019-04-21 MED ORDER — VERAPAMIL HCL 2.5 MG/ML IV SOLN
INTRAVENOUS | Status: AC
  Filled 2019-04-21: qty 2

## 2019-04-21 MED ORDER — HEPARIN (PORCINE) IN NACL 1000-0.9 UT/500ML-% IV SOLN
INTRAVENOUS | Status: DC | PRN
  Administered 2019-04-21 (×2): 500 mL

## 2019-04-21 MED ORDER — ASPIRIN 81 MG PO CHEW: 81.0000 mg | CHEWABLE_TABLET | ORAL | Status: DC

## 2019-04-21 MED ORDER — FENTANYL CITRATE (PF) 100 MCG/2ML IJ SOLN
INTRAMUSCULAR | Status: AC
  Filled 2019-04-21: qty 2

## 2019-04-21 MED ORDER — SODIUM CHLORIDE 0.9 % WEIGHT BASED INFUSION
3.0000 mL/kg/h | INTRAVENOUS | Status: AC
  Administered 2019-04-21: 3 mL/kg/h via INTRAVENOUS

## 2019-04-21 MED ORDER — HEPARIN (PORCINE) IN NACL 1000-0.9 UT/500ML-% IV SOLN
INTRAVENOUS | Status: AC
  Filled 2019-04-21: qty 1000

## 2019-04-21 SURGICAL SUPPLY — 12 items
CATH 5FR JL3.5 JR4 ANG PIG MP (CATHETERS) ×2 IMPLANT
CATH BALLN WEDGE 5F 110CM (CATHETERS) ×2 IMPLANT
DEVICE RAD COMP TR BAND LRG (VASCULAR PRODUCTS) ×2 IMPLANT
GLIDESHEATH SLEND SS 6F .021 (SHEATH) ×2 IMPLANT
GUIDEWIRE INQWIRE 1.5J.035X260 (WIRE) ×1 IMPLANT
INQWIRE 1.5J .035X260CM (WIRE) ×2
KIT HEART LEFT (KITS) ×2 IMPLANT
PACK CARDIAC CATHETERIZATION (CUSTOM PROCEDURE TRAY) ×2 IMPLANT
SHEATH GLIDE SLENDER 4/5FR (SHEATH) ×2 IMPLANT
TRANSDUCER W/STOPCOCK (MISCELLANEOUS) ×2 IMPLANT
TUBING CIL FLEX 10 FLL-RA (TUBING) ×2 IMPLANT
WIRE EMERALD 3MM-J .025X260CM (WIRE) ×2 IMPLANT

## 2019-04-21 NOTE — Discharge Instructions (Signed)
Radial Site Care ° °This sheet gives you information about how to care for yourself after your procedure. Your health care provider may also give you more specific instructions. If you have problems or questions, contact your health care provider. °What can I expect after the procedure? °After the procedure, it is common to have: °· Bruising and tenderness at the catheter insertion area. °Follow these instructions at home: °Medicines °· Take over-the-counter and prescription medicines only as told by your health care provider. °Insertion site care °· Follow instructions from your health care provider about how to take care of your insertion site. Make sure you: °? Wash your hands with soap and water before you change your bandage (dressing). If soap and water are not available, use hand sanitizer. °? Change your dressing as told by your health care provider. °? Leave stitches (sutures), skin glue, or adhesive strips in place. These skin closures may need to stay in place for 2 weeks or longer. If adhesive strip edges start to loosen and curl up, you may trim the loose edges. Do not remove adhesive strips completely unless your health care provider tells you to do that. °· Check your insertion site every day for signs of infection. Check for: °? Redness, swelling, or pain. °? Fluid or blood. °? Pus or a bad smell. °? Warmth. °· Do not take baths, swim, or use a hot tub until your health care provider approves. °· You may shower 24-48 hours after the procedure, or as directed by your health care provider. °? Remove the dressing and gently wash the site with plain soap and water. °? Pat the area dry with a clean towel. °? Do not rub the site. That could cause bleeding. °· Do not apply powder or lotion to the site. °Activity ° °· For 24 hours after the procedure, or as directed by your health care provider: °? Do not flex or bend the affected arm. °? Do not push or pull heavy objects with the affected arm. °? Do not  drive yourself home from the hospital or clinic. You may drive 24 hours after the procedure unless your health care provider tells you not to. °? Do not operate machinery or power tools. °· Do not lift anything that is heavier than 10 lb (4.5 kg), or the limit that you are told, until your health care provider says that it is safe. °· Ask your health care provider when it is okay to: °? Return to work or school. °? Resume usual physical activities or sports. °? Resume sexual activity. °General instructions °· If the catheter site starts to bleed, raise your arm and put firm pressure on the site. If the bleeding does not stop, get help right away. This is a medical emergency. °· If you went home on the same day as your procedure, a responsible adult should be with you for the first 24 hours after you arrive home. °· Keep all follow-up visits as told by your health care provider. This is important. °Contact a health care provider if: °· You have a fever. °· You have redness, swelling, or yellow drainage around your insertion site. °Get help right away if: °· You have unusual pain at the radial site. °· The catheter insertion area swells very fast. °· The insertion area is bleeding, and the bleeding does not stop when you hold steady pressure on the area. °· Your arm or hand becomes pale, cool, tingly, or numb. °These symptoms may represent a serious problem   that is an emergency. Do not wait to see if the symptoms will go away. Get medical help right away. Call your local emergency services (911 in the [place] Do not drive yourself to the hospital. °Summary °· After the procedure, it is common to have bruising and tenderness at the site. °· Follow instructions from your health care provider about how to take care of your radial site wound. Check the wound every day for signs of infection. °· Do not lift anything that is heavier than 10 lb (4.5 kg), or the limit that you are told, until your health care provider says  that it is safe. °This information is not intended to replace advice given to you by your health care provider. Make sure you discuss any questions you have with your health care provider. °Document Released: 06/07/2010 Document Revised: 06/10/2017 Document Reviewed: 06/10/2017 °Elsevier Patient Education © 2020 [employer] ° °

## 2019-04-21 NOTE — Interval H&P Note (Signed)
History and Physical Interval Note:  04/21/2019 9:49 AM  [name]  has presented today for cardiac catheterization, with the diagnosis of constrictive pericarditis.  The various methods of treatment have been discussed with the patient and family. After consideration of risks, benefits and other options for treatment, the patient has consented to  Procedure(s): RIGHT/LEFT HEART CATH AND CORONARY ANGIOGRAPHY (N/A) as a surgical intervention.  The patient's history has been reviewed, patient examined, no change in status, stable for surgery.  I have reviewed the patient's chart and labs.  Questions were answered to the patient's satisfaction.    Cath Lab Visit (complete for each Cath Lab visit)  Clinical Evaluation Leading to the Procedure:   ACS: No.  Non-ACS:    Anginal/Heart Failure Classification: NYHA IV  Anti-ischemic medical therapy: No Therapy  Non-Invasive Test Results: No non-invasive testing performed  Prior CABG: No previous CABG  Slaton Reaser

## 2019-04-22 ENCOUNTER — Encounter (HOSPITAL_COMMUNITY): Payer: Self-pay | Admitting: Internal Medicine

## 2019-04-26 ENCOUNTER — Telehealth: Payer: Self-pay | Admitting: *Deleted

## 2019-04-26 NOTE — Telephone Encounter (Signed)
Per Dr End 04/21/19- Would you be able to help get [name] in to see Dr. Haroldine Laws in the office in ~2 weeks (per Dr. Haroldine Laws)?   Pt is aware appt has been scheduled with Dr Haroldine Laws 05/05/19 10:30 AM [facility]. Gina in Dr Bensimhon's office will mail new patient packet to patient.  Patient asked me to call [name], his daughter-in-law,  and give her information about appt with Dr Haroldine Laws. I spoke with [name] and discussed pt's appt with Dr Haroldine Laws, she knows to call me if questions.

## 2019-04-29 ENCOUNTER — Other Ambulatory Visit: Payer: Self-pay

## 2019-04-29 ENCOUNTER — Ambulatory Visit (INDEPENDENT_AMBULATORY_CARE_PROVIDER_SITE_OTHER): Payer: Medicare HMO | Admitting: Physician Assistant

## 2019-04-29 ENCOUNTER — Encounter: Payer: Self-pay | Admitting: Physician Assistant

## 2019-04-29 VITALS — BP 134/82 | HR 104 | Ht 67.0 in | Wt 199.1 lb

## 2019-04-29 DIAGNOSIS — I311 Chronic constrictive pericarditis: Secondary | ICD-10-CM

## 2019-04-29 DIAGNOSIS — K7031 Alcoholic cirrhosis of liver with ascites: Secondary | ICD-10-CM | POA: Diagnosis not present

## 2019-04-29 DIAGNOSIS — I5032 Chronic diastolic (congestive) heart failure: Secondary | ICD-10-CM | POA: Diagnosis not present

## 2019-04-29 DIAGNOSIS — I4819 Other persistent atrial fibrillation: Secondary | ICD-10-CM

## 2019-04-29 MED ORDER — FUROSEMIDE 80 MG PO TABS: 80.0000 mg | ORAL_TABLET | Freq: Two times a day (BID) | ORAL | 3 refills | Status: DC

## 2019-04-29 NOTE — Patient Instructions (Signed)
Medication Instructions:  1. INCREASE LASIX TO 80 MG TWICE DAILY; NEW PRESCRIPTION HAS BEEN SENT IN *If you need a refill on your cardiac medications before your next appointment, please call your pharmacy*  Lab Work: TODAY BMET, PRO BNP If you have labs (blood work) drawn today and your tests are completely normal, you will receive your results only by: Marland Kitchen MyChart Message (if you have MyChart) OR . A paper copy in the mail If you have any lab test that is abnormal or we need to change your treatment, we will call you to review the results.  Testing/Procedures: NONE ORDERED TODAY  Follow-Up: At [facility], you and your health needs are our priority.  As part of our continuing mission to provide you with exceptional heart care, we have created designated Provider Care Teams.  These Care Teams include your primary Cardiologist (physician) and Advanced Practice Providers (APPs -  Physician Assistants and Nurse Practitioners) who all work together to provide you with the care you need, when you need it.  Your next appointment:   05/05/19 @ 10:30 WITH DR. Haroldine Laws   Other Instructions

## 2019-04-29 NOTE — Progress Notes (Addendum)
Cardiology Office Note    Date:  04/29/2019   ID:  [name], DOB [birth year], MRN [id number]  PCP:  Shon Baton, MD  Cardiologist:Dr. Meda Coffee   Chief Complaint: cath follow up   History of Present Illness:   [name] is a 79 y.o. 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    [name] last seen by [name] in 2011 with Dr. Stanford Breed. He has a history of nuclear stress test in 2007 which showed a small area of apical ischemia. Echo 2009 with normal EF but diastolic dysfunction. He had an aflutter ablation prior to 2011. In 2011, he was noted to have no recurrences of flutter, but was maintained on toprol for Afib. Repeat nuc in 2010 was unchanged, no evidence of ischemia.  Admitted 02/2019 with abdominal pain. Felt significantly better after B/L thoracentesis, paracentesis and increasing diuretics dose. He had significant diffuse circumferential calcification of his pericardium on prior chest CT. Echo with preserved LV function and constrictive physiology.  Diagnosed with constrictive pericarditis.   He wad was doing well when seen by me 04/06/2019. Underwent R& L cath 12/3 showing 99% stenosis of ostium of OM3, small vessel>> medical therapy. Moderately elevatd left heart filling pressures. Severely elevated right heart and pulmonary artery pressures. There is subtle discordance in the LV/RV pressure tracings suggesting mild constrictive physiology.This was reviewed with Dr. Sharlotte Alamo. Changed Lasix to torsemide 40mg  BID.   Here today for follow up.  Patient reports he took torsemide for 2 days and her fluid status got worse.  He noted increase lower extremity swelling and shortness of breath and then he switched back to Lasix 40 mg twice daily.  He has significant lower  extremity edema.  Sleeps on one pillow and sometimes on recliner.  He denies chest pain, palpitation, dizziness, melena or blood in his stool or urine.   Past Medical History:  Diagnosis Date  . Acute respiratory failure with hypoxia ([place]) 03/09/2019  . Adenomatous polyps 06/2004  . Agent orange exposure   . Alcoholic cirrhosis of liver with ascites ([place])   . Alcoholism ([place])   . Allergy   . Anxiety    PTSD- no meds  . Atrial fibrillation ([place])   . Atrial flutter ([place])    had ablation   . CAD in native artery    cath 04/2019: 99% stenosis of ostium of OM>> small vessel>> RX tx  . Chronic constrictive pericarditis    a. cath 04/2019 - moderate elevated L heart pressure; severely edevated R heart adn pulomary artery pressures   . Chronic kidney disease    "beginnings of kidney failure"- 3-4 years ago  . Depression   . Diabetes mellitus without complication (HCC)    no meds  . Diverticulosis   . GERD (gastroesophageal reflux disease)   . Hemorrhoids   . Hiatal hernia   . Hx of hernia repair   . Hyperlipidemia   . Hypertension   . Hyponatremia 08/2018  . Iron deficiency anemia   . Lung nodules    bilateral  . Pedal edema   . Pneumonia    06-2016  . PTSD (post-traumatic stress disorder)   . Substance abuse ([place])    alcohol use  . Ulcer   . Varicose veins     Past Surgical History:  Procedure Laterality Date  . ATRIAL ABLATION SURGERY    . COLONOSCOPY    . FUDUCIAL PLACEMENT N/A 07/26/2018   Procedure: PLACEMENT OF FUDUCIAL;  Surgeon: [name], MD;  Location: [facility];  Service: Thoracic;  Laterality: N/A;  . IR PARACENTESIS  10/28/2016  . IR PARACENTESIS  11/12/2016  . IR PARACENTESIS  12/02/2016  . IR PARACENTESIS  12/10/2016  . IR PARACENTESIS  12/30/2016  . IR PARACENTESIS  01/27/2017  . IR PARACENTESIS  09/09/2017  . IR PARACENTESIS  03/04/2018  . IR PARACENTESIS  04/08/2018  . IR PARACENTESIS  05/10/2018  . IR PARACENTESIS  05/21/2018  . IR PARACENTESIS   06/16/2018  . IR PARACENTESIS  07/23/2018  . IR PARACENTESIS  10/06/2018  . IR PARACENTESIS  10/28/2018  . IR PARACENTESIS  01/12/2019  . IR PARACENTESIS  01/28/2019  . IR PARACENTESIS  02/04/2019  . IR PARACENTESIS  02/15/2019  . IR PARACENTESIS  02/28/2019  . IR PARACENTESIS  03/22/2019  . IR PARACENTESIS  04/04/2019  . IR PARACENTESIS  04/15/2019  . IR RADIOLOGIST EVAL & MGMT  06/22/2018  . IR THORACENTESIS ASP PLEURAL SPACE W/IMG GUIDE  03/09/2019  . IR THORACENTESIS ASP PLEURAL SPACE W/IMG GUIDE  03/10/2019  . POLYPECTOMY    . RIGHT/LEFT HEART CATH AND CORONARY ANGIOGRAPHY N/A 04/21/2019   Procedure: RIGHT/LEFT HEART CATH AND CORONARY ANGIOGRAPHY;  Surgeon: Nelva Bush, MD;  Location: [facility];  Service: Cardiovascular;  Laterality: N/A;  . THORACENTESIS  03/09/2019  . TONSILLECTOMY    . UMBILICAL HERNIA REPAIR    . VARICOSE VEIN SURGERY     x4  . VIDEO BRONCHOSCOPY WITH ENDOBRONCHIAL NAVIGATION N/A 07/26/2018   Procedure: VIDEO BRONCHOSCOPY WITH ENDOBRONCHIAL NAVIGATION;  Surgeon: [name], MD;  Location: [facility];  Service: Thoracic;  Laterality: N/A;    Current Medications: Prior to Admission medications   Medication Sig Start Date End Date Taking? Authorizing Provider  folic acid (FOLVITE) 1 MG tablet Take 1 tablet (1 mg total) by mouth daily. 09/01/18   Black, Lezlie Octave, NP  Guaifenesin (MUCINEX MAXIMUM STRENGTH) 1200 MG TB12 Take 1,200 mg by mouth daily as needed (congestion).    [provider]  linaclotide (LINZESS) 145 MCG CAPS capsule Take 145 mcg by mouth daily as needed (constipation).    [provider]  Multiple Vitamin (MULTIVITAMIN WITH MINERALS) TABS tablet Take 1 tablet by mouth daily. 09/01/18   Black, Lezlie Octave, NP  omeprazole (PRILOSEC) 20 MG capsule Take 1 capsule (20 mg total) by mouth 2 (two) times daily. 03/23/13   Ladene Artist, MD  potassium chloride (KLOR-CON) 10 MEQ tablet Take 2 tablets (20 mEq total) by mouth daily.  04/21/19   End, Harrell Gave, MD  Skin Protectants, Misc. (EUCERIN) cream Apply 1 application topically as needed (irritated skin).    [provider]  sodium chloride (OCEAN) 0.65 % SOLN nasal spray Place 1 spray into both nostrils as needed for congestion.    [provider]  spironolactone (ALDACTONE) 50 MG tablet Take 50 mg by mouth 2 (two) times daily. 04/04/19   [provider]  thiamine 100 MG tablet Take 1 tablet (100 mg total) by mouth daily. 09/01/18   Black, Lezlie Octave, NP  torsemide (DEMADEX) 20 MG tablet Take 2 tablets (40 mg total) by mouth 2 (two) times daily. 04/21/19   End, Harrell Gave, MD    Allergies:   Lisinopril, Lipitor [atorvastatin], and Sulfonamide derivatives   Social History  Socioeconomic History  . Marital status: Single    Spouse name: Not on file  . Number of children: 5  . Years of education: Not on file  . Highest education level: Not on file  Occupational History  . Occupation: retired    Fish farm manager: RETIRED  Tobacco Use  . Smoking status: Never Smoker  . Smokeless tobacco: Never Used  Substance and Sexual Activity  . Alcohol use: Yes    Comment: occasaional beer  . Drug use: No  . Sexual activity: Not on file  Other Topics Concern  . Not on file  Social History Narrative  . Not on file   Social Determinants of Health   Financial Resource Strain:   . Difficulty of Paying Living Expenses: Not on file  Food Insecurity:   . Worried About Charity fundraiser in the Last Year: Not on file  . Ran Out of Food in the Last Year: Not on file  Transportation Needs:   . Lack of Transportation (Medical): Not on file  . Lack of Transportation (Non-Medical): Not on file  Physical Activity:   . Days of Exercise per Week: Not on file  . Minutes of Exercise per Session: Not on file  Stress:   . Feeling of Stress : Not on file  Social Connections:   . Frequency of Communication with Friends and Family: Not on file  . Frequency of  Social Gatherings with Friends and Family: Not on file  . Attends Religious Services: Not on file  . Active Member of Clubs or Organizations: Not on file  . Attends Archivist Meetings: Not on file  . Marital Status: Not on file     Family History:  The patient's family history includes Colon polyps in his brother; Heart disease in his father; Prostate cancer in his brother; Stroke in his mother.   ROS:   Please see the history of present illness.    ROS All other systems reviewed and are negative.   PHYSICAL EXAM:   VS:  BP 134/82   Pulse (!) 104   Ht 5\' 7"  (1.702 m)   Wt 199 lb 1.9 oz (90.3 kg)   SpO2 97%   BMI 31.19 kg/m    GEN: Well nourished, well developed, in no acute distress  HEENT: normal  Neck: + JVD, no carotid bruits, or masses Cardiac: Ir IR ; no murmurs, rubs, or gallops,2-3+ BL LE edema  Respiratory: Diminished breath sound on base GI: soft, nontender, + distended, + BS MS: no deformity or atrophy  Skin: warm and dry, no rash Neuro:  Alert and Oriented x 3, Strength and sensation are intact Psych: euthymic mood, full affect  Wt Readings from Last 3 Encounters:  04/29/19 199 lb 1.9 oz (90.3 kg)  04/21/19 190 lb (86.2 kg)  04/06/19 195 lb (88.5 kg)      Studies/Labs Reviewed:   EKG:  EKG is ordered today.  The ekg ordered today demonstrates atrial fibrillation at rate of 87 bpm  Recent Labs: 08/28/2018: TSH 1.317 03/09/2019: B Natriuretic Peptide 303.9; Magnesium 1.9 03/11/2019: ALT 12 04/18/2019: Platelets 234 04/21/2019: BUN 8; Creatinine, Ser 0.75; Hemoglobin 12.2; Potassium 3.5; Sodium 127   Lipid Panel    Component Value Date/Time   CHOL 143 06/25/2006 0837   TRIG 67 06/25/2006 0837   HDL 32.4 (L) 06/25/2006 0837   CHOLHDL 4.4 CALC 06/25/2006 0837   VLDL 13 06/25/2006 0837   LDLCALC 97 06/25/2006 0837    Additional studies/  records that were reviewed today include:   RIGHT/LEFT HEART CATH AND CORONARY ANGIOGRAPHY 04/21/2019    Conclusions: 1. 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  There is no significant coronary artery disease involving the major coronary arteries, though there is suggestion of tethering. 2. Moderately elevated left heart filling pressures. 3. Severely elevated right heart and pulmonary artery pressures. 4. There is subtle discordance in the LV/RV pressure tracings suggesting mild constrictive physiology. 5. Normal Fick cardiac output/index.  Recommendations: 1. Hemodynamics reviewed in person with Dr. Haroldine Laws.  There is likely a mixed restrictive and constrictive picture.  We favor escalation of diuresis and surgical consultation for consideration of pericardial stripping of the patient fails aggressive medical therapy. 2. We will switch furosemide to torsemide 40 mg BID and have [name] follow-up with Dr. Haroldine Laws in the heart failure clinic in ~2 weeks. 3. Medical therapy of coronary disease involving small branch of OM3.    Dominance: Right   Echo 03/09/2019 1. Left ventricular ejection fraction, by visual estimation, is 60 to 65%. The left ventricle has normal function. There is mildly increased left ventricular hypertrophy.  2. Constrictive physiology.  3. Left ventricular diastolic Doppler parameters are indeterminate pattern of LV diastolic filling.  4. Global right ventricle has normal systolic function.The right ventricular size is normal. No increase in right ventricular wall thickness.  5. Left atrial size was normal.  6. Right atrial size was mildly dilated.  7. Moderate ascites is present.  8. Trivial pericardial effusion is present.  9. Mild mitral annular calcification. 10. The mitral valve is normal in structure. Trace mitral valve regurgitation. 11. The tricuspid valve is grossly normal. Tricuspid valve regurgitation is trivial. 12. The aortic valve is abnormal Aortic valve regurgitation was not visualized  by color flow Doppler. 13. There is Moderate calcification of the aortic valve. 14. There is Moderate thickening of the aortic valve. 15. The pulmonic valve was grossly normal. Pulmonic valve regurgitation is not visualized by color flow Doppler. 16. Moderately elevated pulmonary artery systolic pressure. 17. The inferior vena cava is dilated in size with <50% respiratory variability, suggesting right atrial pressure of 15 mmHg.  ASSESSMENT & PLAN:   1. Mixed restrictive and constrictive pericarditis - Normal LVEF. Cath moderately elevatd left heart filling pressures. Severely elevated right heart and pulmonary artery pressures. There is likely a mixed restrictive and constrictive picture. Lasix changed to Torsemide however he noted worsening edema on torsemide and eventually back on Lasix 40 mg twice daily.  He does not want to go back on torsemide currently.  He is willing to discuss in the future.  He has significantly volume overloaded by exam.  Will check bmet in BNP for baseline. -Increase Lasix to 80 mg twice daily -Continue spironolactone 50 mg twice daily -Continue K. Dur 20 mEq daily - Follow up with Dr. Haroldine Laws as schedule next week  2.  Alcoholic liver cirrhosis undergoing paracentesis every two weeks - Compilant  3. HTN -Blood pressure stable on current medication.  4. Atrial flutter s/p remote ablation 5. Persistent atrial fibrillation  - He was in afib by EKG 03/08/19 and 04/21/2019. ? in setting of anticoagulation in the setting of cirrhosis with hx of gastric AVMs.  CHA2DS2-VASc 2 score of 4 for age, hypertension and CHF.  He is in atrial fibrillation today.  Will send message to Dr. Meda Coffee to comment on his anticoagulation.  Seems like not a good candidate given  ongoing paracentesis.  Medication Adjustments/Labs and Tests Ordered: Current medicines are reviewed at length with the patient today.  Concerns regarding medicines are outlined above.  Medication changes, Labs  and Tests ordered today are listed in the Patient Instructions below. Patient Instructions  Medication Instructions:  1. INCREASE LASIX TO 80 MG TWICE DAILY; NEW PRESCRIPTION HAS BEEN SENT IN *If you need a refill on your cardiac medications before your next appointment, please call your pharmacy*  Lab Work: TODAY BMET, PRO BNP If you have labs (blood work) drawn today and your tests are completely normal, you will receive your results only by: Marland Kitchen MyChart Message (if you have MyChart) OR . A paper copy in the mail If you have any lab test that is abnormal or we need to change your treatment, we will call you to review the results.  Testing/Procedures: NONE ORDERED TODAY  Follow-Up: At [facility], you and your health needs are our priority.  As part of our continuing mission to provide you with exceptional heart care, we have created designated Provider Care Teams.  These Care Teams include your primary Cardiologist (physician) and Advanced Practice Providers (APPs -  Physician Assistants and Nurse Practitioners) who all work together to provide you with the care you need, when you need it.  Your next appointment:   05/05/19 @ 10:30 WITH DR. Haroldine Laws   Other Instructions      Signed, Leanor Kail, PA  04/29/2019 2:42 PM    [facility] [place], [place], [place]  [address] Phone: [phone]; Fax: [phone]

## 2019-04-30 LAB — BASIC METABOLIC PANEL
BUN/Creatinine Ratio: 13 (ref 10–24)
BUN: 10 mg/dL (ref 8–27)
CO2: 28 mmol/L (ref 20–29)
Calcium: 8.8 mg/dL (ref 8.6–10.2)
Chloride: 87 mmol/L — ABNORMAL LOW (ref 96–106)
Creatinine, Ser: 0.75 mg/dL — ABNORMAL LOW (ref 0.76–1.27)
GFR calc Af Amer: 104 mL/min/{1.73_m2} (ref >59)
GFR calc non Af Amer: 90 mL/min/{1.73_m2} (ref >59)
Glucose: 125 mg/dL — ABNORMAL HIGH (ref 65–99)
Potassium: 3.7 mmol/L (ref 3.5–5.2)
Sodium: 128 mmol/L — ABNORMAL LOW (ref 134–144)

## 2019-04-30 LAB — PRO B NATRIURETIC PEPTIDE: NT-Pro BNP: 1007 pg/mL — ABNORMAL HIGH (ref 0–486)

## 2019-05-02 ENCOUNTER — Other Ambulatory Visit (HOSPITAL_COMMUNITY): Payer: Self-pay | Admitting: Internal Medicine

## 2019-05-02 ENCOUNTER — Other Ambulatory Visit: Payer: Self-pay | Admitting: Internal Medicine

## 2019-05-02 DIAGNOSIS — R188 Other ascites: Secondary | ICD-10-CM

## 2019-05-03 ENCOUNTER — Telehealth: Payer: Self-pay | Admitting: *Deleted

## 2019-05-03 DIAGNOSIS — E7849 Other hyperlipidemia: Secondary | ICD-10-CM | POA: Diagnosis not present

## 2019-05-03 DIAGNOSIS — E669 Obesity, unspecified: Secondary | ICD-10-CM | POA: Diagnosis not present

## 2019-05-03 DIAGNOSIS — E1122 Type 2 diabetes mellitus with diabetic chronic kidney disease: Secondary | ICD-10-CM | POA: Diagnosis not present

## 2019-05-03 DIAGNOSIS — E559 Vitamin D deficiency, unspecified: Secondary | ICD-10-CM | POA: Diagnosis not present

## 2019-05-03 DIAGNOSIS — Z125 Encounter for screening for malignant neoplasm of prostate: Secondary | ICD-10-CM | POA: Diagnosis not present

## 2019-05-03 NOTE — Telephone Encounter (Signed)
Called pt re: lab results, left a message for him to call back

## 2019-05-03 NOTE — Telephone Encounter (Signed)
-----   Message from [place], [place] sent at 05/02/2019 10:38 AM EST ----- Fluid marker is minimally up. Na and Scr at baseline. Follow up in CHF clinic as scheduled.

## 2019-05-04 ENCOUNTER — Other Ambulatory Visit: Payer: Self-pay

## 2019-05-04 ENCOUNTER — Telehealth (HOSPITAL_COMMUNITY): Payer: Self-pay

## 2019-05-04 ENCOUNTER — Ambulatory Visit (HOSPITAL_COMMUNITY)
Admission: RE | Admit: 2019-05-04 | Discharge: 2019-05-04 | Disposition: A | Discharge status: Home / Self Care | Payer: Medicare HMO | Source: Ambulatory Visit | Attending: Internal Medicine | Admitting: Internal Medicine

## 2019-05-04 ENCOUNTER — Other Ambulatory Visit (HOSPITAL_COMMUNITY): Payer: Self-pay | Admitting: Internal Medicine

## 2019-05-04 DIAGNOSIS — R188 Other ascites: Secondary | ICD-10-CM

## 2019-05-04 NOTE — Progress Notes (Signed)
IR requested by Dr. Virgina Jock for possible image-guided paracentesis.  Limited abdominal US revealed little to no fluid that could not be safely accessed with procedure today. Images sent to Dr. Laurence Ferrari for review. Informed patient that procedure will not occur today. All questions answered and concerns addressed. Will make Dr. Virgina Jock aware.   IR available in future if needed.   Bea Graff Dajia Gunnels, PA-C 05/04/2019, 2:09 PM

## 2019-05-04 NOTE — Telephone Encounter (Signed)

## 2019-05-05 ENCOUNTER — Encounter (HOSPITAL_COMMUNITY): Payer: Self-pay | Admitting: Internal Medicine

## 2019-05-05 ENCOUNTER — Ambulatory Visit (HOSPITAL_COMMUNITY)
Admission: RE | Admit: 2019-05-05 | Discharge: 2019-05-05 | Disposition: A | Discharge status: Home / Self Care | Payer: Medicare HMO | Source: Ambulatory Visit | Attending: Internal Medicine | Admitting: Internal Medicine

## 2019-05-05 VITALS — BP 132/82 | HR 82 | Wt 195.2 lb

## 2019-05-05 DIAGNOSIS — E785 Hyperlipidemia, unspecified: Secondary | ICD-10-CM | POA: Diagnosis not present

## 2019-05-05 DIAGNOSIS — Z800 Family history of malignant neoplasm of digestive organs: Secondary | ICD-10-CM | POA: Diagnosis not present

## 2019-05-05 DIAGNOSIS — R188 Other ascites: Secondary | ICD-10-CM | POA: Diagnosis not present

## 2019-05-05 DIAGNOSIS — I130 Hypertensive heart and chronic kidney disease with heart failure and stage 1 through stage 4 chronic kidney disease, or unspecified chronic kidney disease: Secondary | ICD-10-CM | POA: Diagnosis not present

## 2019-05-05 DIAGNOSIS — I311 Chronic constrictive pericarditis: Secondary | ICD-10-CM | POA: Diagnosis not present

## 2019-05-05 DIAGNOSIS — Z8249 Family history of ischemic heart disease and other diseases of the circulatory system: Secondary | ICD-10-CM | POA: Diagnosis not present

## 2019-05-05 DIAGNOSIS — K7460 Unspecified cirrhosis of liver: Secondary | ICD-10-CM | POA: Insufficient documentation

## 2019-05-05 DIAGNOSIS — Z882 Allergy status to sulfonamides status: Secondary | ICD-10-CM | POA: Insufficient documentation

## 2019-05-05 DIAGNOSIS — I2510 Atherosclerotic heart disease of native coronary artery without angina pectoris: Secondary | ICD-10-CM

## 2019-05-05 DIAGNOSIS — I4819 Other persistent atrial fibrillation: Secondary | ICD-10-CM

## 2019-05-05 DIAGNOSIS — Z823 Family history of stroke: Secondary | ICD-10-CM | POA: Insufficient documentation

## 2019-05-05 DIAGNOSIS — K7031 Alcoholic cirrhosis of liver with ascites: Secondary | ICD-10-CM

## 2019-05-05 DIAGNOSIS — I4821 Permanent atrial fibrillation: Secondary | ICD-10-CM | POA: Insufficient documentation

## 2019-05-05 DIAGNOSIS — I5032 Chronic diastolic (congestive) heart failure: Secondary | ICD-10-CM | POA: Insufficient documentation

## 2019-05-05 DIAGNOSIS — Z8042 Family history of malignant neoplasm of prostate: Secondary | ICD-10-CM | POA: Diagnosis not present

## 2019-05-05 DIAGNOSIS — Z79899 Other long term (current) drug therapy: Secondary | ICD-10-CM | POA: Diagnosis not present

## 2019-05-05 DIAGNOSIS — Z888 Allergy status to other drugs, medicaments and biological substances status: Secondary | ICD-10-CM | POA: Diagnosis not present

## 2019-05-05 MED ORDER — POTASSIUM CHLORIDE ER 10 MEQ PO TBCR: 20.0000 meq | EXTENDED_RELEASE_TABLET | Freq: Every day | ORAL | 5 refills | Status: AC

## 2019-05-05 MED ORDER — METOLAZONE 2.5 MG PO TABS: 2.5000 mg | ORAL_TABLET | ORAL | 6 refills | Status: DC

## 2019-05-05 NOTE — Patient Instructions (Signed)
Start Metolazone 2.5 mg every Monday and Friday  Take an extra 40 meq (4 tabs) of Potassium on Monday and Friday with Metolazone  Please wear your compression hose daily, place them on as soon as you get up in the morning and remove before you go to bed at night.  Your physician has requested that you have a cardiac MRI. Cardiac MRI uses a computer to create images of your heart as its beating, producing both still and moving pictures of your heart and major blood vessels. For further information please visit [email or web address] Please follow the instruction sheet given to you today for more information.  ONCE THIS IS APPROVED BY YOUR INSURANCE COMPANY WE WILL CALL YOU TO SCHEDULE  Your physician recommends that you schedule a follow-up appointment in: 2-3 weeks  If you have any questions or concerns before your next appointment please send [place] a message through [place] or call our office at [phone].  At the [facility], you and your health needs are our priority. As part of our continuing mission to provide you with exceptional heart care, we have created designated Provider Care Teams. These Care Teams include your primary Cardiologist (physician) and Advanced Practice Providers (APPs- Physician Assistants and Nurse Practitioners) who all work together to provide you with the care you need, when you need it.   You may see any of the following providers on your designated Care Team at your next follow up: Marland Kitchen Dr Glori Bickers . Dr Loralie Champagne . Darrick Grinder, NP . Lyda Jester, PA . Audry Riles, PharmD   Please be sure to bring in all your medications bottles to every appointment.

## 2019-05-05 NOTE — Progress Notes (Signed)
ADVANCED HF CLINIC CONSULT NOTE  Referring Physician: Dr. Saunders Revel Primary Care: Dr. Shon Baton  Primary Cardiologist: Dr. Meda Coffee  HPI:  [name] is a 78 y.o. 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 Saunders Revel for further management of constrictive pericarditis.   He had an aflutter ablation prior to 2011. In 2011, he was noted to have no recurrences of flutter, but was maintained on toprol for Afib. Repeat nuc in 2010 was unchanged, no evidence of ischemia.  Says he started dealing with ascites and having paracenteses at least two years ago. Ascites felt due to ETOH cirrhosis. Having paracentesis about every 2 weeks. Still drinking 2-3 Loudermilk/day.   CT chest in 9/20 showed 1. Extensive pericardial thickening and calcification with morphologic changes indicative of constrictive physiology. Further clinical evaluation is recommended. 2. Liver has a shrunken appearance and nodular contour, indicative of underlying cirrhosis with evidence of portal venous hypertension and moderate volume of ascites, similar to the prior examination.  Admitted 02/2019 with abdominal pain.Feltsignificantly better after B/L thoracentesis, paracentesisand increasing diuretics dose. He hadsignificant diffuse circumferential calcification of his pericardiumon prior chest CT. Echo with preserved LV function and constrictive physiology.  Underwent R& L cath 12/3 showing 99% stenosis of ostium of OM3, small vessel>> medical therapy. Moderately elevatd left heart filling pressures. Severely elevated right heart and pulmonary artery pressures. There is subtle discordance in the LV/RV pressure tracings suggesting mild constrictive physiology. I reviewed the tracings personally in the cath lab and agreed. Changed Lasix to torsemide 40mg   BID. Patient reports he took torsemide for 2 days and her fluid status got worse.  He noted increase lower extremity swelling and shortness of breath and then he switched back to Lasix 40 mg twice daily.  Now taking lasix 80 bid and spiro 50 bid. Feels like fluid is much better. Went for paracentesis recently and didn't have much fluid.   He presents for initial consult in HF Clinic. Continue with significant lower extremity edema. Sleeps on one pillow and sometimes on recliner.  He denies chest pain, palpitation, dizziness, melena or blood in his stool or urine. Says he can get around ok but has to use walker due to poor balance.   Lost his oldest son to drugs recenlty   Review of Systems: [y] = yes, [ ]  = no   General: Weight gain [ ] ; Weight loss [name] ]; Anorexia [ ] ; Fatigue [name] ]; Fever [ ] ; Chills [ ] ; Weakness [ ]   Cardiac: Chest pain/pressure [ ] ; Resting SOB [ ] ; Exertional SOB [ y]; Orthopnea [ ] ; Pedal Edema [ y]; Palpitations [ ] ; Syncope [ ] ; Presyncope [ ] ; Paroxysmal nocturnal dyspnea[ ]   Pulmonary: Cough [ ] ; Wheezing[ ] ; Hemoptysis[ ] ; Sputum [ ] ; Snoring [ ]   GI: Vomiting[ ] ; Dysphagia[ ] ; Melena[ ] ; Hematochezia [ ] ; Heartburn[ ] ; Abdominal pain [ ] ; Constipation [ ] ; Diarrhea [ ] ; BRBPR [ ]   GU: Hematuria[ ] ; Dysuria [ ] ; Nocturia[ ]   Vascular: Pain in legs with walking [ ] ; Pain in feet with lying flat [ ] ; Non-healing sores [ ] ; Stroke [ ] ; TIA [ ] ; Slurred speech [ ] ;  Neuro: Headaches[ ] ; Vertigo[ ] ; Seizures[ ] ; Paresthesias[ ] ;Blurred vision [ ] ; Diplopia [ ] ; Vision changes [ ]   Ortho/Skin: Arthritis [name] ]; Joint pain [name] ]; Muscle pain [ ] ; Joint swelling [ ] ;  Back Pain [ ] ; Rash [ ]   Psych: Depression[ y]; Anxiety[ ]   Heme: Bleeding problems [ y]; Clotting disorders [ ] ; Anemia [ ]   Endocrine: Diabetes [ ] ; Thyroid dysfunction[ ]    Past Medical History:  Diagnosis Date  . Acute respiratory failure with hypoxia ([place]) 03/09/2019  . Adenomatous polyps 06/2004  . Agent  orange exposure   . Alcoholic cirrhosis of liver with ascites ([place])   . Alcoholism ([place])   . Allergy   . Anxiety    PTSD- no meds  . Atrial fibrillation ([place])   . Atrial flutter ([place])    had ablation   . CAD in native artery    cath 04/2019: 99% stenosis of ostium of OM>> small vessel>> RX tx  . Chronic constrictive pericarditis    a. cath 04/2019 - moderate elevated L heart pressure; severely edevated R heart adn pulomary artery pressures   . Chronic kidney disease    "beginnings of kidney failure"- 3-4 years ago  . Depression   . Diabetes mellitus without complication (HCC)    no meds  . Diverticulosis   . GERD (gastroesophageal reflux disease)   . Hemorrhoids   . Hiatal hernia   . Hx of hernia repair   . Hyperlipidemia   . Hypertension   . Hyponatremia 08/2018  . Iron deficiency anemia   . Lung nodules    bilateral  . Pedal edema   . Pneumonia    06-2016  . PTSD (post-traumatic stress disorder)   . Substance abuse ([place])    alcohol use  . Ulcer   . Varicose veins     Current Outpatient Medications  Medication Sig Dispense Refill  . folic acid (FOLVITE) 1 MG tablet Take 1 tablet (1 mg total) by mouth daily. 30 tablet 1  . furosemide (LASIX) 80 MG tablet Take 1 tablet (80 mg total) by mouth 2 (two) times daily. 180 tablet 3  . Guaifenesin (MUCINEX MAXIMUM STRENGTH) 1200 MG TB12 Take 1,200 mg by mouth daily as needed (congestion).    Marland Kitchen linaclotide (LINZESS) 145 MCG CAPS capsule Take 145 mcg by mouth daily as needed (constipation).    . LORazepam (ATIVAN) 0.5 MG tablet Take 0.5 mg by mouth at bedtime as needed.    . Multiple Vitamin (MULTIVITAMIN WITH MINERALS) TABS tablet Take 1 tablet by mouth daily.    Marland Kitchen omeprazole (PRILOSEC) 20 MG capsule Take 1 capsule (20 mg total) by mouth 2 (two) times daily. 60 capsule 5  . potassium chloride (KLOR-CON) 10 MEQ tablet Take 2 tablets (20 mEq total) by mouth daily. 60 tablet 5  . Skin Protectants, Misc. (EUCERIN) cream Apply 1  application topically as needed (irritated skin).    . sodium chloride (OCEAN) 0.65 % SOLN nasal spray Place 1 spray into both nostrils as needed for congestion.    Marland Kitchen spironolactone (ALDACTONE) 50 MG tablet Take 50 mg by mouth 2 (two) times daily.    Marland Kitchen thiamine 100 MG tablet Take 1 tablet (100 mg total) by mouth daily. 30 tablet 1   No current facility-administered medications for this encounter.    Allergies  Allergen Reactions  . Lisinopril Anaphylaxis and Other (See Comments)    Hyperkalemia, Dizziness   . Lipitor [Atorvastatin] Other (See Comments)    Marked leg fatigue  . Sulfonamide Derivatives Other (See Comments)    UNSPECIFIED REACTION        Social History   Socioeconomic History  . Marital status: Single    Spouse name:  Not on file  . Number of children: 5  . Years of education: Not on file  . Highest education level: Not on file  Occupational History  . Occupation: retired    Fish farm manager: RETIRED  Tobacco Use  . Smoking status: Never Smoker  . Smokeless tobacco: Never Used  Substance and Sexual Activity  . Alcohol use: Yes    Comment: occasaional beer  . Drug use: No  . Sexual activity: Not on file  Other Topics Concern  . Not on file  Social History Narrative  . Not on file   Social Determinants of Health   Financial Resource Strain:   . Difficulty of Paying Living Expenses: Not on file  Food Insecurity:   . Worried About Charity fundraiser in the Last Year: Not on file  . Ran Out of Food in the Last Year: Not on file  Transportation Needs:   . Lack of Transportation (Medical): Not on file  . Lack of Transportation (Non-Medical): Not on file  Physical Activity:   . Days of Exercise per Week: Not on file  . Minutes of Exercise per Session: Not on file  Stress:   . Feeling of Stress : Not on file  Social Connections:   . Frequency of Communication with Friends and Family: Not on file  . Frequency of Social Gatherings with Friends and Family: Not  on file  . Attends Religious Services: Not on file  . Active Member of Clubs or Organizations: Not on file  . Attends Archivist Meetings: Not on file  . Marital Status: Not on file  Intimate Partner Violence:   . Fear of Current or Ex-Partner: Not on file  . Emotionally Abused: Not on file  . Physically Abused: Not on file  . Sexually Abused: Not on file      Family History  Problem Relation Age of Onset  . Colon polyps Brother   . Prostate cancer Brother   . Heart disease Father        rheumatic fever as child  . Stroke Mother   . Colon cancer Neg Hx   . Esophageal cancer Neg Hx   . Rectal cancer Neg Hx   . Stomach cancer Neg Hx     Vitals:   05/05/19 1109  BP: 132/82  Pulse: 82  SpO2: 98%  Weight: 88.5 kg (195 lb 3.2 oz)    PHYSICAL EXAM: General:  Elderly male. No respiratory difficulty HEENT: normal Neck: supple. JVP to jaw. Carotids 2+ bilat; no bruits. No lymphadenopathy or thryomegaly appreciated. Cor: PMI nondisplaced. Irregular rate & rhythm. No rubs, gallops or murmurs. Lungs: clear Abdomen: soft, nontender, + distended. No hepatosplenomegaly. No bruits or masses. Good bowel sounds. Extremities: no cyanosis, clubbing, rash, 3+ edema into thighs  Neuro: alert & oriented x 3, cranial nerves grossly intact. moves all 4 extremities w/o difficulty. Affect pleasant.   ASSESSMENT & PLAN: 1. 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 Did not think torsemide worked for him so switched back to lasix - now on lasix 80 bid and spiro 50 bid  - cirrhosis likely contributing - start Metolazone 2.5 mg every Monday and Friday - continue compression hose - CT with diffuse pericardial calcification. Will get cMRI to further evaluate. I will discuss possible pericardial stripping with Dr. Prescott Gum but not sure he will be candidate given  comorbidites  and degree of calcification  2. Permanent AF - rate controlled. Not on AC due to cirrhosis and previous bleeding  3. CAD - branch vessel disease - no angina - not on ASA or statin due to liver disease and previous bleeding  4. Cirrhosis - suspect combination of ETOH and RV failure - fluid management as above - continue paracentesis as needed  Glori Bickers, MD  3:53 PM

## 2019-05-10 DIAGNOSIS — I2510 Atherosclerotic heart disease of native coronary artery without angina pectoris: Secondary | ICD-10-CM | POA: Diagnosis not present

## 2019-05-10 DIAGNOSIS — N1831 Chronic kidney disease, stage 3a: Secondary | ICD-10-CM | POA: Diagnosis not present

## 2019-05-10 DIAGNOSIS — I2721 Secondary pulmonary arterial hypertension: Secondary | ICD-10-CM | POA: Diagnosis not present

## 2019-05-10 DIAGNOSIS — I311 Chronic constrictive pericarditis: Secondary | ICD-10-CM | POA: Diagnosis not present

## 2019-05-10 DIAGNOSIS — R918 Other nonspecific abnormal finding of lung field: Secondary | ICD-10-CM | POA: Diagnosis not present

## 2019-05-10 DIAGNOSIS — I129 Hypertensive chronic kidney disease with stage 1 through stage 4 chronic kidney disease, or unspecified chronic kidney disease: Secondary | ICD-10-CM | POA: Diagnosis not present

## 2019-05-10 DIAGNOSIS — R82998 Other abnormal findings in urine: Secondary | ICD-10-CM | POA: Diagnosis not present

## 2019-05-10 DIAGNOSIS — E669 Obesity, unspecified: Secondary | ICD-10-CM | POA: Diagnosis not present

## 2019-05-10 DIAGNOSIS — I10 Essential (primary) hypertension: Secondary | ICD-10-CM | POA: Diagnosis not present

## 2019-05-10 DIAGNOSIS — E871 Hypo-osmolality and hyponatremia: Secondary | ICD-10-CM | POA: Diagnosis not present

## 2019-05-10 DIAGNOSIS — Z0000 Encounter for general adult medical examination without abnormal findings: Secondary | ICD-10-CM | POA: Diagnosis not present

## 2019-05-11 DIAGNOSIS — Z1212 Encounter for screening for malignant neoplasm of rectum: Secondary | ICD-10-CM | POA: Diagnosis not present

## 2019-05-12 DIAGNOSIS — I2510 Atherosclerotic heart disease of native coronary artery without angina pectoris: Secondary | ICD-10-CM | POA: Diagnosis not present

## 2019-05-17 ENCOUNTER — Encounter (HOSPITAL_COMMUNITY): Payer: Self-pay | Admitting: Emergency Medicine

## 2019-05-17 ENCOUNTER — Emergency Department (HOSPITAL_COMMUNITY): Payer: Medicare HMO

## 2019-05-17 ENCOUNTER — Inpatient Hospital Stay (HOSPITAL_COMMUNITY)
Admission: EM | Admit: 2019-05-17 | Discharge: 2019-05-20 | DRG: 291 | Disposition: A | Discharge status: Home / Self Care | Payer: Medicare HMO | Source: Ambulatory Visit | Attending: Internal Medicine | Admitting: Internal Medicine

## 2019-05-17 DIAGNOSIS — Z8249 Family history of ischemic heart disease and other diseases of the circulatory system: Secondary | ICD-10-CM

## 2019-05-17 DIAGNOSIS — I5033 Acute on chronic diastolic (congestive) heart failure: Secondary | ICD-10-CM | POA: Diagnosis present

## 2019-05-17 DIAGNOSIS — I509 Heart failure, unspecified: Secondary | ICD-10-CM

## 2019-05-17 DIAGNOSIS — K7460 Unspecified cirrhosis of liver: Secondary | ICD-10-CM | POA: Diagnosis not present

## 2019-05-17 DIAGNOSIS — R188 Other ascites: Secondary | ICD-10-CM | POA: Diagnosis present

## 2019-05-17 DIAGNOSIS — I4821 Permanent atrial fibrillation: Secondary | ICD-10-CM

## 2019-05-17 DIAGNOSIS — F1010 Alcohol abuse, uncomplicated: Secondary | ICD-10-CM | POA: Diagnosis not present

## 2019-05-17 DIAGNOSIS — Z888 Allergy status to other drugs, medicaments and biological substances status: Secondary | ICD-10-CM

## 2019-05-17 DIAGNOSIS — K7290 Hepatic failure, unspecified without coma: Secondary | ICD-10-CM | POA: Diagnosis present

## 2019-05-17 DIAGNOSIS — Z882 Allergy status to sulfonamides status: Secondary | ICD-10-CM | POA: Diagnosis not present

## 2019-05-17 DIAGNOSIS — K766 Portal hypertension: Secondary | ICD-10-CM | POA: Diagnosis present

## 2019-05-17 DIAGNOSIS — Z8701 Personal history of pneumonia (recurrent): Secondary | ICD-10-CM

## 2019-05-17 DIAGNOSIS — Z79899 Other long term (current) drug therapy: Secondary | ICD-10-CM

## 2019-05-17 DIAGNOSIS — I110 Hypertensive heart disease with heart failure: Secondary | ICD-10-CM | POA: Diagnosis not present

## 2019-05-17 DIAGNOSIS — K7030 Alcoholic cirrhosis of liver without ascites: Secondary | ICD-10-CM

## 2019-05-17 DIAGNOSIS — K7031 Alcoholic cirrhosis of liver with ascites: Secondary | ICD-10-CM | POA: Diagnosis not present

## 2019-05-17 DIAGNOSIS — I311 Chronic constrictive pericarditis: Secondary | ICD-10-CM | POA: Diagnosis present

## 2019-05-17 DIAGNOSIS — I300 Acute nonspecific idiopathic pericarditis: Secondary | ICD-10-CM | POA: Diagnosis not present

## 2019-05-17 DIAGNOSIS — N189 Chronic kidney disease, unspecified: Secondary | ICD-10-CM | POA: Diagnosis present

## 2019-05-17 DIAGNOSIS — E871 Hypo-osmolality and hyponatremia: Secondary | ICD-10-CM

## 2019-05-17 DIAGNOSIS — F1020 Alcohol dependence, uncomplicated: Secondary | ICD-10-CM | POA: Diagnosis present

## 2019-05-17 DIAGNOSIS — Z20822 Contact with and (suspected) exposure to covid-19: Secondary | ICD-10-CM | POA: Diagnosis not present

## 2019-05-17 DIAGNOSIS — E119 Type 2 diabetes mellitus without complications: Secondary | ICD-10-CM | POA: Diagnosis not present

## 2019-05-17 DIAGNOSIS — I2510 Atherosclerotic heart disease of native coronary artery without angina pectoris: Secondary | ICD-10-CM | POA: Diagnosis present

## 2019-05-17 DIAGNOSIS — Z20828 Contact with and (suspected) exposure to other viral communicable diseases: Secondary | ICD-10-CM | POA: Diagnosis not present

## 2019-05-17 DIAGNOSIS — I130 Hypertensive heart and chronic kidney disease with heart failure and stage 1 through stage 4 chronic kidney disease, or unspecified chronic kidney disease: Principal | ICD-10-CM | POA: Diagnosis present

## 2019-05-17 DIAGNOSIS — I5022 Chronic systolic (congestive) heart failure: Secondary | ICD-10-CM

## 2019-05-17 DIAGNOSIS — H9190 Unspecified hearing loss, unspecified ear: Secondary | ICD-10-CM | POA: Diagnosis present

## 2019-05-17 DIAGNOSIS — I4891 Unspecified atrial fibrillation: Secondary | ICD-10-CM | POA: Diagnosis present

## 2019-05-17 DIAGNOSIS — R0602 Shortness of breath: Secondary | ICD-10-CM | POA: Diagnosis not present

## 2019-05-17 DIAGNOSIS — T502X5A Adverse effect of carbonic-anhydrase inhibitors, benzothiadiazides and other diuretics, initial encounter: Secondary | ICD-10-CM | POA: Diagnosis present

## 2019-05-17 DIAGNOSIS — E869 Volume depletion, unspecified: Secondary | ICD-10-CM | POA: Diagnosis not present

## 2019-05-17 DIAGNOSIS — Z87892 Personal history of anaphylaxis: Secondary | ICD-10-CM | POA: Diagnosis not present

## 2019-05-17 DIAGNOSIS — I10 Essential (primary) hypertension: Secondary | ICD-10-CM | POA: Diagnosis present

## 2019-05-17 LAB — OSMOLALITY, URINE: Osmolality, Ur: 488 mOsm/kg (ref 300–900)

## 2019-05-17 LAB — COMPREHENSIVE METABOLIC PANEL
ALT: 23 U/L (ref 0–44)
AST: 34 U/L (ref 15–41)
Albumin: 3.3 g/dL — ABNORMAL LOW (ref 3.5–5.0)
Alkaline Phosphatase: 184 U/L — ABNORMAL HIGH (ref 38–126)
Anion gap: 10 (ref 5–15)
BUN: 14 mg/dL (ref 8–23)
CO2: 29 mmol/L (ref 22–32)
Calcium: 9.4 mg/dL (ref 8.9–10.3)
Chloride: 82 mmol/L — ABNORMAL LOW (ref 98–111)
Creatinine, Ser: 0.73 mg/dL (ref 0.61–1.24)
GFR calc Af Amer: >60 mL/min (ref >60)
GFR calc non Af Amer: >60 mL/min (ref >60)
Glucose, Bld: 124 mg/dL — ABNORMAL HIGH (ref 70–99)
Potassium: 4.6 mmol/L (ref 3.5–5.1)
Sodium: 121 mmol/L — ABNORMAL LOW (ref 135–145)
Total Bilirubin: 2.6 mg/dL — ABNORMAL HIGH (ref 0.3–1.2)
Total Protein: 7.8 g/dL (ref 6.5–8.1)

## 2019-05-17 LAB — SODIUM, URINE, RANDOM: Sodium, Ur: 121 mmol/L

## 2019-05-17 LAB — MAGNESIUM: Magnesium: 1.8 mg/dL (ref 1.7–2.4)

## 2019-05-17 LAB — CBC
HCT: 34.4 % — ABNORMAL LOW (ref 39.0–52.0)
Hemoglobin: 12.1 g/dL — ABNORMAL LOW (ref 13.0–17.0)
MCH: 30.6 pg (ref 26.0–34.0)
MCHC: 35.2 g/dL (ref 30.0–36.0)
MCV: 86.9 fL (ref 80.0–100.0)
Platelets: 218 10*3/uL (ref 150–400)
RBC: 3.96 MIL/uL — ABNORMAL LOW (ref 4.22–5.81)
RDW: 15.9 % — ABNORMAL HIGH (ref 11.5–15.5)
WBC: 6.2 10*3/uL (ref 4.0–10.5)
nRBC: 0 % (ref 0.0–0.2)

## 2019-05-17 LAB — ETHANOL: Alcohol, Ethyl (B): <10 mg/dL (ref <10)

## 2019-05-17 LAB — PHOSPHORUS: Phosphorus: 3.9 mg/dL (ref 2.5–4.6)

## 2019-05-17 LAB — BRAIN NATRIURETIC PEPTIDE: B Natriuretic Peptide: 152.2 pg/mL — ABNORMAL HIGH (ref 0.0–100.0)

## 2019-05-17 MED ORDER — THIAMINE HCL 100 MG/ML IJ SOLN
100.0000 mg | Freq: Every day | INTRAMUSCULAR | Status: DC
  Filled 2019-05-17: qty 2

## 2019-05-17 MED ORDER — LORAZEPAM 1 MG PO TABS
1.0000 mg | ORAL_TABLET | ORAL | Status: DC | PRN
  Administered 2019-05-19: 1 mg via ORAL
  Filled 2019-05-17 (×2): qty 1

## 2019-05-17 MED ORDER — LORAZEPAM 2 MG/ML IJ SOLN: 1.0000 mg | INTRAMUSCULAR | Status: DC | PRN

## 2019-05-17 MED ORDER — ACETAMINOPHEN 325 MG PO TABS: 650.0000 mg | ORAL_TABLET | Freq: Four times a day (QID) | ORAL | Status: DC | PRN

## 2019-05-17 MED ORDER — ENOXAPARIN SODIUM 40 MG/0.4ML ~~LOC~~ SOLN
40.0000 mg | SUBCUTANEOUS | Status: DC
  Administered 2019-05-18 – 2019-05-20 (×3): 40 mg via SUBCUTANEOUS
  Filled 2019-05-17 (×3): qty 0.4

## 2019-05-17 MED ORDER — FOLIC ACID 1 MG PO TABS
1.0000 mg | ORAL_TABLET | Freq: Every day | ORAL | Status: DC
  Administered 2019-05-18 – 2019-05-20 (×3): 1 mg via ORAL
  Filled 2019-05-17 (×3): qty 1

## 2019-05-17 MED ORDER — ADULT MULTIVITAMIN W/MINERALS CH
1.0000 | ORAL_TABLET | Freq: Every day | ORAL | Status: DC
  Administered 2019-05-18 – 2019-05-20 (×3): 1 via ORAL
  Filled 2019-05-17 (×3): qty 1

## 2019-05-17 MED ORDER — ACETAMINOPHEN 650 MG RE SUPP: 650.0000 mg | Freq: Four times a day (QID) | RECTAL | Status: DC | PRN

## 2019-05-17 MED ORDER — ONDANSETRON HCL 4 MG/2ML IJ SOLN: 4.0000 mg | Freq: Four times a day (QID) | INTRAMUSCULAR | Status: DC | PRN

## 2019-05-17 MED ORDER — THIAMINE HCL 100 MG PO TABS
100.0000 mg | ORAL_TABLET | Freq: Every day | ORAL | Status: DC
  Administered 2019-05-18 – 2019-05-20 (×3): 100 mg via ORAL
  Filled 2019-05-17 (×3): qty 1

## 2019-05-17 MED ORDER — ONDANSETRON HCL 4 MG PO TABS: 4.0000 mg | ORAL_TABLET | Freq: Four times a day (QID) | ORAL | Status: DC | PRN

## 2019-05-17 NOTE — ED Provider Notes (Signed)
[facility] EMERGENCY DEPARTMENT Provider Note   CSN: [id number] Arrival date & time: 05/17/19  1419     History No chief complaint on file.   [name] is a 78 y.o. male.  The history is provided by the patient and medical records. No language interpreter was used.   [name] is a 78 y.o. male who presents to the Emergency Department complaining of the lab. Level V caveat due to confusion. He presents to the emergency department for evaluation of lab abnormality. He states that he saw cardiology a few weeks ago and had his medications changed. He is not sure how they were change. He states that he did take a super pill yesterday and urinated all day long. He states that his Lasix has been changed from 40 BID to 80 mg daily. He is taking Spironolactone 25 mg daily. He denies any fevers, headaches, chest pain, shortness of breath, abdominal pain. He has chronic lower extremity edema and this is improved from his baseline. He is eating and drinking without difficulty. He lives at home alone. Denies any vomiting, diarrhea. He does drink 1 to 2 glasses of beer occasionally. Denies any tobacco or drug use.    Past Medical History:  Diagnosis Date  . Acute respiratory failure with hypoxia ([place]) 03/09/2019  . Adenomatous polyps 06/2004  . Agent orange exposure   . Alcoholic cirrhosis of liver with ascites ([place])   . Alcoholism ([place])   . Allergy   . Anxiety    PTSD- no meds  . Atrial fibrillation ([place])   . Atrial flutter ([place])    had ablation   . CAD in native artery    cath 04/2019: 99% stenosis of ostium of OM>> small vessel>> RX tx  . Chronic constrictive pericarditis    a. cath 04/2019 - moderate elevated L heart pressure; severely edevated R heart adn pulomary artery pressures   . Chronic kidney disease    "beginnings of kidney failure"- 3-4 years ago  . Depression   . Diabetes mellitus without complication (HCC)    no meds  . Diverticulosis   . GERD  (gastroesophageal reflux disease)   . Hemorrhoids   . Hiatal hernia   . Hx of hernia repair   . Hyperlipidemia   . Hypertension   . Hyponatremia 08/2018  . Iron deficiency anemia   . Lung nodules    bilateral  . Pedal edema   . Pneumonia    06-2016  . PTSD (post-traumatic stress disorder)   . Substance abuse ([place])    alcohol use  . Ulcer   . Varicose veins     Patient Active Problem List   Diagnosis Date Noted  . Constrictive pericarditis 04/21/2019  . Acute respiratory failure with hypoxia ([place]) 03/09/2019  . Hypervolemia   . Pleural effusion on left   . SOB (shortness of breath)   . Constipation 08/28/2018  . Cirrhosis of liver with ascites ([place]) 08/26/2018  . Leukopenia 07/16/2018  . Lung nodule 07/16/2018  . Coagulopathy ([place]) 07/15/2018  . Iron deficiency anemia 02/19/2016  . Microcytic anemia 02/18/2016  . GERD (gastroesophageal reflux disease) 02/18/2016  . Orthostatic hypotension 02/17/2016  . Syncope 02/17/2016  . Hyponatremia 02/17/2016  . Varicose veins of bilateral lower extremities with other complications 37/02/6268  . Cirrhosis, alcoholic ([place]) 48/54/6270  . Ascites 10/07/2012  . Varicose veins of lower extremities with other complications 35/00/9381  . HYPERLIPIDEMIA 11/01/2008  . ETOH abuse 11/01/2008  . Essential  hypertension 11/01/2008  . Atrial fibrillation ([place]) 11/01/2008  . Atrial flutter ([place]) 11/01/2008  . HEMORRHOIDS 11/01/2008  . ISCHEMIA 11/01/2008  . HERNIA, UMBILICAL 95/18/8416  . DIVERTICULOSIS OF COLON 11/01/2008  . GERD 12/16/2007  . COLONIC POLYPS, ADENOMATOUS, HX OF 12/16/2007    Past Surgical History:  Procedure Laterality Date  . ATRIAL ABLATION SURGERY    . COLONOSCOPY    . FUDUCIAL PLACEMENT N/A 07/26/2018   Procedure: PLACEMENT OF FUDUCIAL;  Surgeon: [name], MD;  Location: [facility];  Service: Thoracic;  Laterality: N/A;  . IR PARACENTESIS  10/28/2016  . IR PARACENTESIS  11/12/2016  . IR PARACENTESIS   12/02/2016  . IR PARACENTESIS  12/10/2016  . IR PARACENTESIS  12/30/2016  . IR PARACENTESIS  01/27/2017  . IR PARACENTESIS  09/09/2017  . IR PARACENTESIS  03/04/2018  . IR PARACENTESIS  04/08/2018  . IR PARACENTESIS  05/10/2018  . IR PARACENTESIS  05/21/2018  . IR PARACENTESIS  06/16/2018  . IR PARACENTESIS  07/23/2018  . IR PARACENTESIS  10/06/2018  . IR PARACENTESIS  10/28/2018  . IR PARACENTESIS  01/12/2019  . IR PARACENTESIS  01/28/2019  . IR PARACENTESIS  02/04/2019  . IR PARACENTESIS  02/15/2019  . IR PARACENTESIS  02/28/2019  . IR PARACENTESIS  03/22/2019  . IR PARACENTESIS  04/04/2019  . IR PARACENTESIS  04/15/2019  . IR RADIOLOGIST EVAL & MGMT  06/22/2018  . IR THORACENTESIS ASP PLEURAL SPACE W/IMG GUIDE  03/09/2019  . IR THORACENTESIS ASP PLEURAL SPACE W/IMG GUIDE  03/10/2019  . POLYPECTOMY    . RIGHT/LEFT HEART CATH AND CORONARY ANGIOGRAPHY N/A 04/21/2019   Procedure: RIGHT/LEFT HEART CATH AND CORONARY ANGIOGRAPHY;  Surgeon: Nelva Bush, MD;  Location: [facility];  Service: Cardiovascular;  Laterality: N/A;  . THORACENTESIS  03/09/2019  . TONSILLECTOMY    . UMBILICAL HERNIA REPAIR    . VARICOSE VEIN SURGERY     x4  . VIDEO BRONCHOSCOPY WITH ENDOBRONCHIAL NAVIGATION N/A 07/26/2018   Procedure: VIDEO BRONCHOSCOPY WITH ENDOBRONCHIAL NAVIGATION;  Surgeon: [name], MD;  Location: [facility];  Service: Thoracic;  Laterality: N/A;       Family History  Problem Relation Age of Onset  . Colon polyps Brother   . Prostate cancer Brother   . Heart disease Father        rheumatic fever as child  . Stroke Mother   . Colon cancer Neg Hx   . Esophageal cancer Neg Hx   . Rectal cancer Neg Hx   . Stomach cancer Neg Hx     Social History   Tobacco Use  . Smoking status: Never Smoker  . Smokeless tobacco: Never Used  Substance Use Topics  . Alcohol use: Yes    Comment: occasaional beer  . Drug use: No    Home Medications Prior to Admission medications    Medication Sig Start Date End Date Taking? Authorizing Provider  folic acid (FOLVITE) 1 MG tablet Take 1 tablet (1 mg total) by mouth daily. 09/01/18   Black, Lezlie Octave, NP  furosemide (LASIX) 80 MG tablet Take 1 tablet (80 mg total) by mouth 2 (two) times daily. 04/29/19   Bhagat, Bhavinkumar, PA  Guaifenesin (MUCINEX MAXIMUM STRENGTH) 1200 MG TB12 Take 1,200 mg by mouth daily as needed (congestion).    [provider]  linaclotide (LINZESS) 145 MCG CAPS capsule Take 145 mcg by mouth daily as needed (constipation).    [provider]  LORazepam (ATIVAN) 0.5 MG  tablet Take 0.5 mg by mouth at bedtime as needed. 03/29/19   [provider]  metolazone (ZAROXOLYN) 2.5 MG tablet Take 1 tablet (2.5 mg total) by mouth 2 (two) times a week. Every Monday and Friday 05/05/19   Bensimhon, Shaune Pascal, MD  Multiple Vitamin (MULTIVITAMIN WITH MINERALS) TABS tablet Take 1 tablet by mouth daily. 09/01/18   Black, Lezlie Octave, NP  omeprazole (PRILOSEC) 20 MG capsule Take 1 capsule (20 mg total) by mouth 2 (two) times daily. 03/23/13   Ladene Artist, MD  potassium chloride (KLOR-CON) 10 MEQ tablet Take 2 tablets (20 mEq total) by mouth daily. Take extra 4 tabs on Mon and Friday 05/05/19   Bensimhon, Shaune Pascal, MD  Skin Protectants, Misc. (EUCERIN) cream Apply 1 application topically as needed (irritated skin).    [provider]  sodium chloride (OCEAN) 0.65 % SOLN nasal spray Place 1 spray into both nostrils as needed for congestion.    [provider]  spironolactone (ALDACTONE) 50 MG tablet Take 50 mg by mouth 2 (two) times daily. 04/04/19   [provider]  thiamine 100 MG tablet Take 1 tablet (100 mg total) by mouth daily. 09/01/18   Black, Lezlie Octave, NP    Allergies    Lisinopril, Lipitor [atorvastatin], and Sulfonamide derivatives  Review of Systems   Review of Systems  All other systems reviewed and are negative.   Physical Exam Updated Vital Signs BP 102/72    Pulse 89   Temp 98.2 F (36.8 C) (Oral)   Resp 18   Ht 5\' 7"  (1.702 m)   Wt 78.1 kg   SpO2 99%   BMI 26.97 kg/m   Physical Exam Vitals and nursing note reviewed.  Constitutional:      Appearance: He is well-developed.  HENT:     Head: Normocephalic and atraumatic.  Cardiovascular:     Rate and Rhythm: Normal rate and regular rhythm.     Heart sounds: No murmur.  Pulmonary:     Effort: Pulmonary effort is normal. No respiratory distress.     Comments: Mild tachypnea with occasional end expiratory wheezes Abdominal:     Palpations: Abdomen is soft.     Tenderness: There is no abdominal tenderness. There is no guarding or rebound.  Musculoskeletal:        General: Swelling present. No tenderness.  Skin:    General: Skin is warm and dry.  Neurological:     Mental Status: He is alert and oriented to person, place, and time.  Psychiatric:        Behavior: Behavior normal.     ED Results / Procedures / Treatments   Labs (all labs ordered are listed, but only abnormal results are displayed) Labs Reviewed  CBC - Abnormal; Notable for the following components:      Result Value   RBC 3.96 (*)    Hemoglobin 12.1 (*)    HCT 34.4 (*)    RDW 15.9 (*)    All other components within normal limits  COMPREHENSIVE METABOLIC PANEL - Abnormal; Notable for the following components:   Sodium 121 (*)    Chloride 82 (*)    Glucose, Bld 124 (*)    Albumin 3.3 (*)    Alkaline Phosphatase 184 (*)    Total Bilirubin 2.6 (*)    All other components within normal limits  BRAIN NATRIURETIC PEPTIDE - Abnormal; Notable for the following components:   B Natriuretic Peptide 152.2 (*)    All other  components within normal limits  SARS CORONAVIRUS 2 (TAT 6-24 HRS)  ETHANOL  OSMOLALITY, URINE  OSMOLALITY  SODIUM, URINE, RANDOM  BASIC METABOLIC PANEL  MAGNESIUM  PHOSPHORUS    EKG EKG Interpretation  Date/Time:  Tuesday May 17 2019 14:49:45 EST Ventricular Rate:  84 PR  Interval:    QRS Duration: 110 QT Interval:  374 QTC Calculation: 441 R Axis:   -101 Text Interpretation: Atrial fibrillation Right superior axis deviation Septal infarct , age undetermined Abnormal ECG Confirmed by Quintella Reichert [id number]) on 05/17/2019 7:02:34 PM   Radiology DG Chest Port 1 View  Result Date: 05/17/2019 CLINICAL DATA:  Low potassium levels, increased shortness of breath EXAM: PORTABLE CHEST 1 VIEW COMPARISON:  03/10/2019 FINDINGS: Small bilateral pleural effusions, left greater than right. Bilateral diffuse interstitial thickening. No pneumothorax. Stable cardiomegaly. No acute osseous abnormality. IMPRESSION: 1. Findings most consistent with CHF. Electronically Signed   By: Kathreen Devoid   On: 05/17/2019 20:03    Procedures Procedures (including critical care time)  Medications Ordered in ED Medications  LORazepam (ATIVAN) tablet 1-4 mg (has no administration in time range)    Or  LORazepam (ATIVAN) injection 1-4 mg (has no administration in time range)  thiamine tablet 100 mg (has no administration in time range)    Or  thiamine (B-1) injection 100 mg (has no administration in time range)  folic acid (FOLVITE) tablet 1 mg (has no administration in time range)  multivitamin with minerals tablet 1 tablet (has no administration in time range)  acetaminophen (TYLENOL) tablet 650 mg (has no administration in time range)    Or  acetaminophen (TYLENOL) suppository 650 mg (has no administration in time range)  ondansetron (ZOFRAN) tablet 4 mg (has no administration in time range)    Or  ondansetron (ZOFRAN) injection 4 mg (has no administration in time range)  enoxaparin (LOVENOX) injection 40 mg (has no administration in time range)    ED Course  I have reviewed the triage vital signs and the nursing notes.  Pertinent labs & imaging results that were available during my care of the patient were reviewed by me and considered in my medical decision making (see chart  for details).    MDM Rules/Calculators/A&P                      Patient here for evaluation of hyponatremia. He is relatively asymptomatic in the emergency department. He does have evidence of volume overload on evaluation. Labs significant for hyponatremia 121. Chest x-ray demonstrates bilateral pleural effusions. Discussed with cardiologist, Dr. Marletta Lor, recommends nephrology consult and medicine admission. Dr. Johnney Ou with nephrology consulted. Hospitalist consulted for admission. Patient updated findings of studies recommendation for admission and he is in agreement treatment plan.   Final Clinical Impression(s) / ED Diagnoses Final diagnoses:  Hyponatremia  Acute congestive heart failure, unspecified heart failure type [facility])    Rx / DC Orders ED Discharge Orders    None       Quintella Reichert, MD 05/17/19 2330

## 2019-05-17 NOTE — ED Triage Notes (Signed)
Pt here from home told to come to the ED fro a low NA level , no other complaint sat this time

## 2019-05-17 NOTE — Consult Note (Signed)
[facility]  INPATIENT CONSULTATION  Reason for Consultation: hyponatremia Requesting Provider: Dr. Ralene Bathe  HPI: [name] is an 78 y.o. male with EtOH cirrhosis, HTN, atrial flutter s/p ablation, constrictive pericarditis who is seen for evaluation and management of hyponatremia.   Pt has a history of hyponatremia with sodiums generally in the high 120s.  He was evaluated in 08/2018 for hyponatremia into the 116 range which ultimately improved with volume restriction and diuretics.  At that time he was symptomatic with 'dizziness' with na 116.   He recently was evaluated by heart failure clinic for recently diagnosed constrictive pericarditis.  He was on lasix 80 BID and spironolactone 50 BID at that time and metolazone 2.5 M/F was initiated.  Pt says he took dose yesterday and produced '4 gallons' of urine, feeling depleted now.  He estimates losing 20-30lbs of fluid since this was initiated 2 weeks ago but seems a bit unsure on the timeline.  He denies severe thirst, headache, dizziness.  Feels LE edema better than usual.  His main concern is not staying in the hospital long.    PMH: Past Medical History:  Diagnosis Date  . Acute respiratory failure with hypoxia ([place]) 03/09/2019  . Adenomatous polyps 06/2004  . Agent orange exposure   . Alcoholic cirrhosis of liver with ascites ([place])   . Alcoholism ([place])   . Allergy   . Anxiety    PTSD- no meds  . Atrial fibrillation ([place])   . Atrial flutter ([place])    had ablation   . CAD in native artery    cath 04/2019: 99% stenosis of ostium of OM>> small vessel>> RX tx  . Chronic constrictive pericarditis    a. cath 04/2019 - moderate elevated L heart pressure; severely edevated R heart adn pulomary artery pressures   . Chronic kidney disease    "beginnings of kidney failure"- 3-4 years ago  . Depression   . Diabetes mellitus without complication (HCC)    no meds  . Diverticulosis   . GERD (gastroesophageal reflux disease)   .  Hemorrhoids   . Hiatal hernia   . Hx of hernia repair   . Hyperlipidemia   . Hypertension   . Hyponatremia 08/2018  . Iron deficiency anemia   . Lung nodules    bilateral  . Pedal edema   . Pneumonia    06-2016  . PTSD (post-traumatic stress disorder)   . Substance abuse ([place])    alcohol use  . Ulcer   . Varicose veins    PSH: Past Surgical History:  Procedure Laterality Date  . ATRIAL ABLATION SURGERY    . COLONOSCOPY    . FUDUCIAL PLACEMENT N/A 07/26/2018   Procedure: PLACEMENT OF FUDUCIAL;  Surgeon: [name], MD;  Location: [facility];  Service: Thoracic;  Laterality: N/A;  . IR PARACENTESIS  10/28/2016  . IR PARACENTESIS  11/12/2016  . IR PARACENTESIS  12/02/2016  . IR PARACENTESIS  12/10/2016  . IR PARACENTESIS  12/30/2016  . IR PARACENTESIS  01/27/2017  . IR PARACENTESIS  09/09/2017  . IR PARACENTESIS  03/04/2018  . IR PARACENTESIS  04/08/2018  . IR PARACENTESIS  05/10/2018  . IR PARACENTESIS  05/21/2018  . IR PARACENTESIS  06/16/2018  . IR PARACENTESIS  07/23/2018  . IR PARACENTESIS  10/06/2018  . IR PARACENTESIS  10/28/2018  . IR PARACENTESIS  01/12/2019  . IR PARACENTESIS  01/28/2019  . IR PARACENTESIS  02/04/2019  . IR PARACENTESIS  02/15/2019  .  IR PARACENTESIS  02/28/2019  . IR PARACENTESIS  03/22/2019  . IR PARACENTESIS  04/04/2019  . IR PARACENTESIS  04/15/2019  . IR RADIOLOGIST EVAL & MGMT  06/22/2018  . IR THORACENTESIS ASP PLEURAL SPACE W/IMG GUIDE  03/09/2019  . IR THORACENTESIS ASP PLEURAL SPACE W/IMG GUIDE  03/10/2019  . POLYPECTOMY    . RIGHT/LEFT HEART CATH AND CORONARY ANGIOGRAPHY N/A 04/21/2019   Procedure: RIGHT/LEFT HEART CATH AND CORONARY ANGIOGRAPHY;  Surgeon: Nelva Bush, MD;  Location: [facility];  Service: Cardiovascular;  Laterality: N/A;  . THORACENTESIS  03/09/2019  . TONSILLECTOMY    . UMBILICAL HERNIA REPAIR    . VARICOSE VEIN SURGERY     x4  . VIDEO BRONCHOSCOPY WITH ENDOBRONCHIAL NAVIGATION N/A 07/26/2018   Procedure: VIDEO  BRONCHOSCOPY WITH ENDOBRONCHIAL NAVIGATION;  Surgeon: [name], MD;  Location: [facility];  Service: Thoracic;  Laterality: N/A;    Past Medical History:  Diagnosis Date  . Acute respiratory failure with hypoxia ([place]) 03/09/2019  . Adenomatous polyps 06/2004  . Agent orange exposure   . Alcoholic cirrhosis of liver with ascites ([place])   . Alcoholism ([place])   . Allergy   . Anxiety    PTSD- no meds  . Atrial fibrillation ([place])   . Atrial flutter ([place])    had ablation   . CAD in native artery    cath 04/2019: 99% stenosis of ostium of OM>> small vessel>> RX tx  . Chronic constrictive pericarditis    a. cath 04/2019 - moderate elevated L heart pressure; severely edevated R heart adn pulomary artery pressures   . Chronic kidney disease    "beginnings of kidney failure"- 3-4 years ago  . Depression   . Diabetes mellitus without complication (HCC)    no meds  . Diverticulosis   . GERD (gastroesophageal reflux disease)   . Hemorrhoids   . Hiatal hernia   . Hx of hernia repair   . Hyperlipidemia   . Hypertension   . Hyponatremia 08/2018  . Iron deficiency anemia   . Lung nodules    bilateral  . Pedal edema   . Pneumonia    06-2016  . PTSD (post-traumatic stress disorder)   . Substance abuse ([place])    alcohol use  . Ulcer   . Varicose veins     Medications:  I have reviewed the patient's current medications.  (Not in a hospital admission)   ALLERGIES:   Allergies  Allergen Reactions  . Lisinopril Anaphylaxis and Other (See Comments)    Hyperkalemia, Dizziness   . Lipitor [Atorvastatin] Other (See Comments)    Marked leg fatigue  . Sulfonamide Derivatives Other (See Comments)    UNSPECIFIED REACTION      FAM HX: Family History  Problem Relation Age of Onset  . Colon polyps Brother   . Prostate cancer Brother   . Heart disease Father        rheumatic fever as child  . Stroke Mother   . Colon cancer Neg Hx   . Esophageal cancer Neg Hx   . Rectal  cancer Neg Hx   . Stomach cancer Neg Hx     Social History:   reports that he has never smoked. He has never used smokeless tobacco. He reports current alcohol use. He reports that he does not use drugs.  ROS: 12 system ROS per HPI above  Blood pressure (!) 159/90, pulse 82, temperature 98.2 F (36.8 C), temperature source Oral, resp. rate 16, height  5\' 7"  (1.702 m), weight 78.1 kg, SpO2 100 %. PHYSICAL EXAM: Gen: chronically ill but nontoxic  Eyes: anicteric, EOMI ENT: MMM Neck: supple, JVD distended fully at 45 degrees CV: irregular, reg rate, no rub Abd: soft, mod distended, no TTP Lungs: clear ant and laterally GU: no foley Extr: 1+ chronic violaceous edema Neuro: no asterixis, AOx3, provides decent history   Results for orders placed or performed during the hospital encounter of 05/17/19 (from the past 48 hour(s))  CBC     Status: Abnormal   Collection Time: 05/17/19  3:09 PM  Result Value Ref Range   WBC 6.2 4.0 - 10.5 K/uL   RBC 3.96 (L) 4.22 - 5.81 MIL/uL   Hemoglobin 12.1 (L) 13.0 - 17.0 g/dL   HCT 34.4 (L) 39.0 - 52.0 %   MCV 86.9 80.0 - 100.0 fL   MCH 30.6 26.0 - 34.0 pg   MCHC 35.2 30.0 - 36.0 g/dL   RDW 15.9 (H) 11.5 - 15.5 %   Platelets 218 150 - 400 K/uL   nRBC 0.0 0.0 - 0.2 %    Comment: Performed at [facility], [address], [place], [place] [address]  Comprehensive metabolic panel     Status: Abnormal   Collection Time: 05/17/19  3:09 PM  Result Value Ref Range   Sodium 121 (L) 135 - 145 mmol/L   Potassium 4.6 3.5 - 5.1 mmol/L   Chloride 82 (L) 98 - 111 mmol/L   CO2 29 22 - 32 mmol/L   Glucose, Bld 124 (H) 70 - 99 mg/dL   BUN 14 8 - 23 mg/dL   Creatinine, Ser 0.73 0.61 - 1.24 mg/dL   Calcium 9.4 8.9 - 10.3 mg/dL   Total Protein 7.8 6.5 - 8.1 g/dL   Albumin 3.3 (L) 3.5 - 5.0 g/dL   AST 34 15 - 41 U/L   ALT 23 0 - 44 U/L   Alkaline Phosphatase 184 (H) 38 - 126 U/L   Total Bilirubin 2.6 (H) 0.3 - 1.2 mg/dL   GFR calc non Af Amer >60 >60  mL/min   GFR calc Af Amer >60 >60 mL/min   Anion gap 10 5 - 15    Comment: Performed at [facility] [address] [place], [place] [address]  Brain natriuretic peptide     Status: Abnormal   Collection Time: 05/17/19  3:09 PM  Result Value Ref Range   B Natriuretic Peptide 152.2 (H) 0.0 - 100.0 pg/mL    Comment: Performed at [facility] [address] [place],  [address]    DG Chest Port 1 View  Result Date: 05/17/2019 CLINICAL DATA:  Low potassium levels, increased shortness of breath EXAM: PORTABLE CHEST 1 VIEW COMPARISON:  03/10/2019 FINDINGS: Small bilateral pleural effusions, left greater than right. Bilateral diffuse interstitial thickening. No pneumothorax. Stable cardiomegaly. No acute osseous abnormality. IMPRESSION: 1. Findings most consistent with CHF. Electronically Signed   By: Kathreen Devoid   On: 05/17/2019 20:03    Assessment/Plan **hyponatremia:  Acute on chronic.  In setting of cirrhosis and heart failure he has hypervolemic hyponatremia but his acute worsening appears in the setting of diuresis with metolazone.  Recommend holding all diuretics tonight (lasix, aldactone and metolazone) and following sodium in AM.  I will send serum and urine osm, urine na but these will need to be cautiously interpreted in the setting of his physiology.  As he is asymptomatic + cirrhotic and heart failure physiology  would prefer to avoid volume challenge and allow to rise naturally.  Would strongly discourage use of salt tabs for now as this will lead to worsening volume retention and overload.      **Cirrhosis:  Per above hold diuretics tonight. Will reinitiate lasix and aldactone soon most likely, may need more conservative metolazone dosing such as wt based.    **Constrictive pericarditis with diastolic heart failure:  Course suggests he's mildly volume depleted, per above.    Justin Mend 05/17/2019, 9:42 PM

## 2019-05-17 NOTE — Consult Note (Signed)
Cardiology Consultation:   Patient ID: [name] MRN: [id number]; DOB: 16-Oct-1940  Admit date: 05/17/2019 Date of Consult: 05/17/2019  Primary Care Provider: Shon Baton, MD Primary Cardiologist: Ena Dawley, MD  Primary Electrophysiologist:  None    Patient Profile:   [name] is a 78 y.o. 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   Briefly, [name] was admitted in 02/2019 with abdominal pain. During admission he requiring bilateral thoracentesis and paracentesis for volume overload as well as escalating doses of diuretics. An echo done during admission was suspicious for constrictive physiology, and a follow up R&L heart cath demonstrated mild constriction in addition to elevated R > L sided filling pressures.   He was see by Dr. Haroldine Laws in follow up on 12/17, at which point he was taking lasix 80mg  and spironolactone 50mg  BID with improvement in his fluid status, but still with marked volume overload. Metolazone 2.5 mg was added Monday and Friday and he was planned to get a cMRI to evaluate for pericardial calcification and be evaluated for possible stripping.  Outpatient labs were checked today and notable for severe hyponatremia to 119 (baseline Na 125-130), for which he was sent to the ER. On exam, appears comfortable, though does seem mildly confused (kept called metolazone potassium, asked me to cut his gloves to fit his pulse oximeter, with some word finding difficulty). Does have clear evidence of ongoing volume overload on exam, but suspect this is much improved from prior.   Past Medical History:  Diagnosis Date  . Acute respiratory failure with hypoxia ([place]) 03/09/2019  . Adenomatous polyps 06/2004  . Agent orange exposure   . Alcoholic cirrhosis of liver with ascites ([place])   . Alcoholism ([place])   . Allergy   . Anxiety     PTSD- no meds  . Atrial fibrillation ([place])   . Atrial flutter ([place])    had ablation   . CAD in native artery    cath 04/2019: 99% stenosis of ostium of OM>> small vessel>> RX tx  . Chronic constrictive pericarditis    a. cath 04/2019 - moderate elevated L heart pressure; severely edevated R heart adn pulomary artery pressures   . Chronic kidney disease    "beginnings of kidney failure"- 3-4 years ago  . Depression   . Diabetes mellitus without complication ([facility])    no meds  . Diverticulosis   . GERD (gastroesophageal reflux disease)   . Hemorrhoids   . Hiatal hernia   . Hx of hernia repair   . Hyperlipidemia   . Hypertension   . Hyponatremia 08/2018  . Iron deficiency anemia   . Lung nodules    bilateral  . Pedal edema   . Pneumonia    06-2016  . PTSD (post-traumatic stress disorder)   . Substance abuse ([place])    alcohol use  . Ulcer   . Varicose veins     Past Surgical History:  Procedure Laterality Date  . ATRIAL ABLATION SURGERY    . COLONOSCOPY    . FUDUCIAL PLACEMENT N/A 07/26/2018   Procedure: PLACEMENT OF FUDUCIAL;  Surgeon: [name], MD;  Location: [facility];  Service: Thoracic;  Laterality: N/A;  . IR PARACENTESIS  10/28/2016  . IR PARACENTESIS  11/12/2016  . IR PARACENTESIS  12/02/2016  . IR PARACENTESIS  12/10/2016  . IR PARACENTESIS  12/30/2016  .  IR PARACENTESIS  01/27/2017  . IR PARACENTESIS  09/09/2017  . IR PARACENTESIS  03/04/2018  . IR PARACENTESIS  04/08/2018  . IR PARACENTESIS  05/10/2018  . IR PARACENTESIS  05/21/2018  . IR PARACENTESIS  06/16/2018  . IR PARACENTESIS  07/23/2018  . IR PARACENTESIS  10/06/2018  . IR PARACENTESIS  10/28/2018  . IR PARACENTESIS  01/12/2019  . IR PARACENTESIS  01/28/2019  . IR PARACENTESIS  02/04/2019  . IR PARACENTESIS  02/15/2019  . IR PARACENTESIS  02/28/2019  . IR PARACENTESIS  03/22/2019  . IR PARACENTESIS  04/04/2019  . IR PARACENTESIS  04/15/2019  . IR RADIOLOGIST EVAL & MGMT  06/22/2018  . IR THORACENTESIS  ASP PLEURAL SPACE W/IMG GUIDE  03/09/2019  . IR THORACENTESIS ASP PLEURAL SPACE W/IMG GUIDE  03/10/2019  . POLYPECTOMY    . RIGHT/LEFT HEART CATH AND CORONARY ANGIOGRAPHY N/A 04/21/2019   Procedure: RIGHT/LEFT HEART CATH AND CORONARY ANGIOGRAPHY;  Surgeon: Nelva Bush, MD;  Location: [facility];  Service: Cardiovascular;  Laterality: N/A;  . THORACENTESIS  03/09/2019  . TONSILLECTOMY    . UMBILICAL HERNIA REPAIR    . VARICOSE VEIN SURGERY     x4  . VIDEO BRONCHOSCOPY WITH ENDOBRONCHIAL NAVIGATION N/A 07/26/2018   Procedure: VIDEO BRONCHOSCOPY WITH ENDOBRONCHIAL NAVIGATION;  Surgeon: [name], MD;  Location: [facility];  Service: Thoracic;  Laterality: N/A;     Home Medications:  Prior to Admission medications   Medication Sig Start Date End Date Taking? Authorizing Provider  folic acid (FOLVITE) 1 MG tablet Take 1 tablet (1 mg total) by mouth daily. 09/01/18   Black, Lezlie Octave, NP  furosemide (LASIX) 80 MG tablet Take 1 tablet (80 mg total) by mouth 2 (two) times daily. 04/29/19   Bhagat, Bhavinkumar, PA  Guaifenesin (MUCINEX MAXIMUM STRENGTH) 1200 MG TB12 Take 1,200 mg by mouth daily as needed (congestion).    [provider]  linaclotide (LINZESS) 145 MCG CAPS capsule Take 145 mcg by mouth daily as needed (constipation).    [provider]  LORazepam (ATIVAN) 0.5 MG tablet Take 0.5 mg by mouth at bedtime as needed. 03/29/19   [provider]  metolazone (ZAROXOLYN) 2.5 MG tablet Take 1 tablet (2.5 mg total) by mouth 2 (two) times a week. Every Monday and Friday 05/05/19   Bensimhon, Shaune Pascal, MD  Multiple Vitamin (MULTIVITAMIN WITH MINERALS) TABS tablet Take 1 tablet by mouth daily. 09/01/18   Black, Lezlie Octave, NP  omeprazole (PRILOSEC) 20 MG capsule Take 1 capsule (20 mg total) by mouth 2 (two) times daily. 03/23/13   Ladene Artist, MD  potassium chloride (KLOR-CON) 10 MEQ tablet Take 2 tablets (20 mEq total) by mouth daily. Take extra 4 tabs on  Mon and Friday 05/05/19   Bensimhon, Shaune Pascal, MD  Skin Protectants, Misc. (EUCERIN) cream Apply 1 application topically as needed (irritated skin).    [provider]  sodium chloride (OCEAN) 0.65 % SOLN nasal spray Place 1 spray into both nostrils as needed for congestion.    [provider]  spironolactone (ALDACTONE) 50 MG tablet Take 50 mg by mouth 2 (two) times daily. 04/04/19   [provider]  thiamine 100 MG tablet Take 1 tablet (100 mg total) by mouth daily. 09/01/18   Black, Lezlie Octave, NP    Inpatient Medications: Scheduled Meds:  Continuous Infusions:  PRN Meds:   Allergies:    Allergies  Allergen Reactions  . Lisinopril Anaphylaxis and Other (See Comments)  Hyperkalemia, Dizziness   . Lipitor [Atorvastatin] Other (See Comments)    Marked leg fatigue  . Sulfonamide Derivatives Other (See Comments)    UNSPECIFIED REACTION      Social History:   Social History   Socioeconomic History  . Marital status: Single    Spouse name: Not on file  . Number of children: 5  . Years of education: Not on file  . Highest education level: Not on file  Occupational History  . Occupation: retired    Fish farm manager: RETIRED  Tobacco Use  . Smoking status: Never Smoker  . Smokeless tobacco: Never Used  Substance and Sexual Activity  . Alcohol use: Yes    Comment: occasaional beer  . Drug use: No  . Sexual activity: Not on file  Other Topics Concern  . Not on file  Social History Narrative  . Not on file   Social Determinants of Health   Financial Resource Strain:   . Difficulty of Paying Living Expenses: Not on file  Food Insecurity:   . Worried About Charity fundraiser in the Last Year: Not on file  . Ran Out of Food in the Last Year: Not on file  Transportation Needs:   . Lack of Transportation (Medical): Not on file  . Lack of Transportation (Non-Medical): Not on file  Physical Activity:   . Days of Exercise per Week: Not on file  .  Minutes of Exercise per Session: Not on file  Stress:   . Feeling of Stress : Not on file  Social Connections:   . Frequency of Communication with Friends and Family: Not on file  . Frequency of Social Gatherings with Friends and Family: Not on file  . Attends Religious Services: Not on file  . Active Member of Clubs or Organizations: Not on file  . Attends Archivist Meetings: Not on file  . Marital Status: Not on file  Intimate Partner Violence:   . Fear of Current or Ex-Partner: Not on file  . Emotionally Abused: Not on file  . Physically Abused: Not on file  . Sexually Abused: Not on file    Family History:   Family History  Problem Relation Age of Onset  . Colon polyps Brother   . Prostate cancer Brother   . Heart disease Father        rheumatic fever as child  . Stroke Mother   . Colon cancer Neg Hx   . Esophageal cancer Neg Hx   . Rectal cancer Neg Hx   . Stomach cancer Neg Hx      Review of Systems: [y] = yes, [ ]  = no     General: Weight gain [ ] ; Weight loss [ ] ; Anorexia [ ] ; Fatigue [ ] ; Fever [ ] ; Chills [ ] ; Weakness [ ]    Cardiac: Chest pain/pressure [ ] ; Resting SOB [ ] ; Exertional SOB [ ] ; Orthopnea [ ] ; Pedal Edema [name] ]; Palpitations [ ] ; Syncope [ ] ; Presyncope [ ] ; Paroxysmal nocturnal dyspnea[ ]    Pulmonary: Cough [ ] ; Wheezing[ ] ; Hemoptysis[ ] ; Sputum [ ] ; Snoring [ ]    GI: Vomiting[ ] ; Dysphagia[ ] ; Melena[ ] ; Hematochezia [ ] ; Heartburn[ ] ; Abdominal pain [ ] ; Constipation [ ] ; Diarrhea [ ] ; BRBPR [ ]    GU: Hematuria[ ] ; Dysuria [ ] ; Nocturia[ ]    Vascular: Pain in legs with walking [ ] ; Pain in feet with lying flat [ ] ; Non-healing sores [ ] ; Stroke [ ] ; TIA [ ] ; Slurred  speech [ ] ;   Neuro: Headaches[ ] ; Vertigo[ ] ; Seizures[ ] ; Paresthesias[ ] ;Blurred vision [ ] ; Diplopia [ ] ; Vision changes [ ]    Ortho/Skin: Arthritis [ ] ; Joint pain [ ] ; Muscle pain [ ] ; Joint swelling [ ] ; Back Pain [ ] ; Rash [ ]    Psych: Depression[ ] ;  Anxiety[ ]    Heme: Bleeding problems [ ] ; Clotting disorders [ ] ; Anemia [ ]    Endocrine: Diabetes [ ] ; Thyroid dysfunction[ ]   Physical Exam/Data:   Vitals:   05/17/19 1955 05/17/19 1957 05/17/19 1958 05/17/19 1959  BP:  (!) 159/90    Pulse:    82  Resp:  20 (!) 22 16  Temp:      TempSrc:      SpO2:    100%  Weight: 78.1 kg     Height: 5\' 7"  (1.702 m)      No intake or output data in the 24 hours ending 05/17/19 2125 Filed Weights   05/17/19 1955  Weight: 78.1 kg   Body mass index is 26.97 kg/m.  General:  Well nourished, well developed, in no acute distress. Seems mildly confused.  HEENT: normal Neck: JVD to the jawline.  Endocrine:  No thryomegaly Vascular: No carotid bruits; FA pulses 2+ bilaterally without bruits  Cardiac:  normal S1, S2; RRR, no mrg.  Lungs:  Decreased breath sounds at bilateral bases.  Abd: distended. Tympanic + fluid wave.  Ext: warm, ruddy, tight with 2+ edema.  Musculoskeletal:  No deformities, BUE and BLE strength normal and equal Skin: warm and dry  Neuro:  CNs 2-12 intact, no focal abnormalities noted Psych:  Normal affect   EKG:  The EKG was personally reviewed and demonstrates  Relevant CV Studies: TTE 02/2019 1. Left ventricular ejection fraction, by visual estimation, is 60 to 65%. The left ventricle has normal function. There is mildly increased left ventricular hypertrophy.  2. Constrictive physiology.  3. Left ventricular diastolic Doppler parameters are indeterminate pattern of LV diastolic filling.  4. Global right ventricle has normal systolic function.The right ventricular size is normal. No increase in right ventricular wall thickness.  5. Left atrial size was normal.  6. Right atrial size was mildly dilated.  7. Moderate ascites is present.  8. Trivial pericardial effusion is present.  9. Mild mitral annular calcification. 10. The mitral valve is normal in structure. Trace mitral valve regurgitation. 11. The tricuspid  valve is grossly normal. Tricuspid valve regurgitation is trivial. 12. The aortic valve is abnormal Aortic valve regurgitation was not visualized by color flow Doppler. 13. There is Moderate calcification of the aortic valve. 14. There is Moderate thickening of the aortic valve. 15. The pulmonic valve was grossly normal. Pulmonic valve regurgitation is not visualized by color flow Doppler. 16. Moderately elevated pulmonary artery systolic pressure. 17. The inferior vena cava is dilated in size with <50% respiratory variability, suggesting right atrial pressure of 15 mmHg.  RHC/LHC 04/2019: Conclusions: 1. 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  There is no significant coronary artery disease involving the major coronary arteries, though there is suggestion of tethering. 2. Moderately elevated left heart filling pressures. 3. Severely elevated right heart and pulmonary artery pressures. 4. There is subtle discordance in the LV/RV pressure tracings suggesting mild constrictive physiology. 5. Normal Fick cardiac output/index.  Recommendations: 1. Hemodynamics reviewed in person with Dr. Haroldine Laws.  There is likely a mixed restrictive and constrictive picture.  We favor escalation  of diuresis and surgical consultation for consideration of pericardial stripping of the patient fails aggressive medical therapy. 2. We will switch furosemide to torsemide 40 mg BID and have Mr. Crymes follow-up with Dr. Haroldine Laws in the heart failure clinic in ~2 weeks. 3. Medical therapy of coronary disease involving small branch of OM3.  Laboratory Data:  Chemistry Recent Labs  Lab 05/17/19 1509  NA 121*  K 4.6  CL 82*  CO2 29  GLUCOSE 124*  BUN 14  CREATININE 0.73  CALCIUM 9.4  GFRNONAA >60  GFRAA >60  ANIONGAP 10    Recent Labs  Lab 05/17/19 1509  PROT 7.8  ALBUMIN 3.3*  AST 34  ALT 23  ALKPHOS 184*  BILITOT 2.6*    Hematology Recent Labs  Lab 05/17/19 1509  WBC 6.2  RBC 3.96*  HGB 12.1*  HCT 34.4*  MCV 86.9  MCH 30.6  MCHC 35.2  RDW 15.9*  PLT 218   Cardiac EnzymesNo results for input(s): TROPONINI in the last 168 hours. No results for input(s): TROPIPOC in the last 168 hours.  BNPNo results for input(s): BNP, PROBNP in the last 168 hours.  DDimer No results for input(s): DDIMER in the last 168 hours.  Radiology/Studies:  DG Chest Port 1 View  Result Date: 05/17/2019 CLINICAL DATA:  Low potassium levels, increased shortness of breath EXAM: PORTABLE CHEST 1 VIEW COMPARISON:  03/10/2019 FINDINGS: Small bilateral pleural effusions, left greater than right. Bilateral diffuse interstitial thickening. No pneumothorax. Stable cardiomegaly. No acute osseous abnormality. IMPRESSION: 1. Findings most consistent with CHF. Electronically Signed   By: Kathreen Devoid   On: 05/17/2019 20:03    Assessment and Plan:   Mr. Keena is a 78 year old gentleman who presents with significant, and possibly mildly symptomatic, hyponatremia in the setting of recent diuretic dose escalation. Will need appropriate correction of hyponatremia prior to monitored reintroduction of diuretic therapy.   1. Hyponatremia -- Admit to medicine -- Agree with nephology consult. Exact timeline of low Na unclear but has taken at least two doses of metolazone.  -- Suspect he may need reintroduction of salt tabs, but this will further complicate volume removal.   2. 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  - no additional diuresis tonight pending nephrology recommendations re: hypoNa.  - should resume diuretics once Na correcting and watch in house to ensure no recurrence. - cirrhosis likely contributing - continue compression hose - would try to organize cMRI while admitted to define degree of pericardial calcification.   3. Permanent AF - rate controlled. Not on AC  due to cirrhosis and previous bleeding  4. CAD - branch vessel disease - no angina - not on ASA or statin due to liver disease and previous bleeding  4. Cirrhosis - suspect combination of ETOH and RV failure - fluid management as above - Paracentesis as needed  For questions or updates, please contact [facility] Please consult [email or web address] for contact info under    Signed, Milus Banister, MD  05/17/2019 9:25 PM

## 2019-05-17 NOTE — H&P (Signed)
History and Physical    [name] TDD:[id number] DOB: [birth year] DOA: 05/17/2019  PCP: Shon Baton, MD  Patient coming from: Home  I have personally briefly reviewed patient's old medical records in [place]  Chief Complaint: Hyponatremia  HPI: [name] is a 78 y.o. male with medical history significant of EtOH cirrhosis, HTN, A.Flutter s/p ablation, constrictive pericarditis.  Patient has h/o hyponatremia with sodiums in the upper 120s at baseline.  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  Took dose of Metalozone yesterday, and produced '4 gallons' of urine.  Feeling dry now.  Says he is down 20-30 lbs of fluid since this med initiated 2 weeks ago, but isnt entirely clear on timeline.  Thinks LE edema is improved.  Went to cards office today, lab work there showed sodium was down to 121 from 128 on 12/11.   ED Course: Nephrology consulted, see their note, hospitalist asked to admit for obs.   Review of Systems: As per HPI, otherwise all review of systems negative.  Past Medical History:  Diagnosis Date  . Acute respiratory failure with hypoxia ([place]) 03/09/2019  . Adenomatous polyps 06/2004  . Agent orange exposure   . Alcoholic cirrhosis of liver with ascites ([place])   . Alcoholism ([place])   . Allergy   . Anxiety    PTSD- no meds  . Atrial fibrillation ([place])   . Atrial flutter ([place])    had ablation   . CAD in native artery    cath 04/2019: 99% stenosis of ostium of OM>> small vessel>> RX tx  . Chronic constrictive pericarditis    a. cath 04/2019 - moderate elevated L heart pressure; severely edevated R heart adn pulomary artery pressures   . Chronic kidney disease    "beginnings of kidney failure"- 3-4 years ago  . Depression   . Diabetes mellitus without complication (HCC)    no meds  . Diverticulosis   .  GERD (gastroesophageal reflux disease)   . Hemorrhoids   . Hiatal hernia   . Hx of hernia repair   . Hyperlipidemia   . Hypertension   . Hyponatremia 08/2018  . Iron deficiency anemia   . Lung nodules    bilateral  . Pedal edema   . Pneumonia    06-2016  . PTSD (post-traumatic stress disorder)   . Substance abuse ([place])    alcohol use  . Ulcer   . Varicose veins     Past Surgical History:  Procedure Laterality Date  . ATRIAL ABLATION SURGERY    . COLONOSCOPY    . FUDUCIAL PLACEMENT N/A 07/26/2018   Procedure: PLACEMENT OF FUDUCIAL;  Surgeon: [name], MD;  Location: [facility];  Service: Thoracic;  Laterality: N/A;  . IR PARACENTESIS  10/28/2016  . IR PARACENTESIS  11/12/2016  . IR PARACENTESIS  12/02/2016  . IR PARACENTESIS  12/10/2016  . IR PARACENTESIS  12/30/2016  . IR PARACENTESIS  01/27/2017  . IR PARACENTESIS  09/09/2017  . IR PARACENTESIS  03/04/2018  . IR PARACENTESIS  04/08/2018  . IR PARACENTESIS  05/10/2018  . IR PARACENTESIS  05/21/2018  . IR PARACENTESIS  06/16/2018  . IR PARACENTESIS  07/23/2018  . IR PARACENTESIS  10/06/2018  . IR PARACENTESIS  10/28/2018  . IR PARACENTESIS  01/12/2019  . IR PARACENTESIS  01/28/2019  . IR  PARACENTESIS  02/04/2019  . IR PARACENTESIS  02/15/2019  . IR PARACENTESIS  02/28/2019  . IR PARACENTESIS  03/22/2019  . IR PARACENTESIS  04/04/2019  . IR PARACENTESIS  04/15/2019  . IR RADIOLOGIST EVAL & MGMT  06/22/2018  . IR THORACENTESIS ASP PLEURAL SPACE W/IMG GUIDE  03/09/2019  . IR THORACENTESIS ASP PLEURAL SPACE W/IMG GUIDE  03/10/2019  . POLYPECTOMY    . RIGHT/LEFT HEART CATH AND CORONARY ANGIOGRAPHY N/A 04/21/2019   Procedure: RIGHT/LEFT HEART CATH AND CORONARY ANGIOGRAPHY;  Surgeon: Nelva Bush, MD;  Location: [facility];  Service: Cardiovascular;  Laterality: N/A;  . THORACENTESIS  03/09/2019  . TONSILLECTOMY    . UMBILICAL HERNIA REPAIR    . VARICOSE VEIN SURGERY     x4  . VIDEO BRONCHOSCOPY WITH ENDOBRONCHIAL  NAVIGATION N/A 07/26/2018   Procedure: VIDEO BRONCHOSCOPY WITH ENDOBRONCHIAL NAVIGATION;  Surgeon: [name], MD;  Location: [facility];  Service: Thoracic;  Laterality: N/A;     reports that he has never smoked. He has never used smokeless tobacco. He reports current alcohol use. He reports that he does not use drugs.  Allergies  Allergen Reactions  . Lisinopril Anaphylaxis and Other (See Comments)    Hyperkalemia, Dizziness   . Lipitor [Atorvastatin] Other (See Comments)    Marked leg fatigue  . Sulfonamide Derivatives Other (See Comments)    UNSPECIFIED REACTION      Family History  Problem Relation Age of Onset  . Colon polyps Brother   . Prostate cancer Brother   . Heart disease Father        rheumatic fever as child  . Stroke Mother   . Colon cancer Neg Hx   . Esophageal cancer Neg Hx   . Rectal cancer Neg Hx   . Stomach cancer Neg Hx      Prior to Admission medications   Medication Sig Start Date End Date Taking? Authorizing Provider  folic acid (FOLVITE) 1 MG tablet Take 1 tablet (1 mg total) by mouth daily. 09/01/18   Black, Lezlie Octave, NP  furosemide (LASIX) 80 MG tablet Take 1 tablet (80 mg total) by mouth 2 (two) times daily. 04/29/19   Bhagat, Bhavinkumar, PA  Guaifenesin (MUCINEX MAXIMUM STRENGTH) 1200 MG TB12 Take 1,200 mg by mouth daily as needed (congestion).    [provider]  linaclotide (LINZESS) 145 MCG CAPS capsule Take 145 mcg by mouth daily as needed (constipation).    [provider]  LORazepam (ATIVAN) 0.5 MG tablet Take 0.5 mg by mouth at bedtime as needed. 03/29/19   [provider]  metolazone (ZAROXOLYN) 2.5 MG tablet Take 1 tablet (2.5 mg total) by mouth 2 (two) times a week. Every Monday and Friday 05/05/19   Bensimhon, Shaune Pascal, MD  Multiple Vitamin (MULTIVITAMIN WITH MINERALS) TABS tablet Take 1 tablet by mouth daily. 09/01/18   Black, Lezlie Octave, NP  omeprazole (PRILOSEC) 20 MG capsule Take 1 capsule (20 mg total) by  mouth 2 (two) times daily. 03/23/13   Ladene Artist, MD  potassium chloride (KLOR-CON) 10 MEQ tablet Take 2 tablets (20 mEq total) by mouth daily. Take extra 4 tabs on Mon and Friday 05/05/19   Bensimhon, Shaune Pascal, MD  Skin Protectants, Misc. (EUCERIN) cream Apply 1 application topically as needed (irritated skin).    [provider]  sodium chloride (OCEAN) 0.65 % SOLN nasal spray Place 1 spray into both nostrils as needed for congestion.    [provider]  spironolactone (  ALDACTONE) 50 MG tablet Take 50 mg by mouth 2 (two) times daily. 04/04/19   [provider]  thiamine 100 MG tablet Take 1 tablet (100 mg total) by mouth daily. 09/01/18   Radene Gunning, NP    Physical Exam: Vitals:   05/17/19 2115 05/17/19 2130 05/17/19 2145 05/17/19 2200  BP: 116/77 (!) 130/92 (!) 130/91 114/72  Pulse: 87 89    Resp: 18 (!) 21 17 16   Temp:      TempSrc:      SpO2: 99% 99%    Weight:      Height:        Constitutional: NAD, calm, comfortable Eyes: PERRL, lids and conjunctivae normal ENMT: Mucous membranes are moist. Posterior pharynx clear of any exudate or lesions.Normal dentition.  Neck: normal, supple, no masses, no thyromegaly Respiratory: clear to auscultation bilaterally, no wheezing, no crackles. Normal respiratory effort. No accessory muscle use.  Cardiovascular: Regular rate and rhythm, no murmurs / rubs / gallops. No extremity edema. 2+ pedal pulses. No carotid bruits.  Abdomen: no tenderness, no masses palpated. No hepatosplenomegaly. Bowel sounds positive.  Musculoskeletal: no clubbing / cyanosis. No joint deformity upper and lower extremities. Good ROM, no contractures. Normal muscle tone.  Skin: no rashes, lesions, ulcers. No induration Neurologic: CN 2-12 grossly intact. Sensation intact, DTR normal. Strength 5/5 in all 4.  Psychiatric: Normal judgment and insight. Alert and oriented x 3. Normal mood.    Labs on Admission: I have personally reviewed  following labs and imaging studies  CBC: Recent Labs  Lab 05/17/19 1509  WBC 6.2  HGB 12.1*  HCT 34.4*  MCV 86.9  PLT 782   Basic Metabolic Panel: Recent Labs  Lab 05/17/19 1509  NA 121*  K 4.6  CL 82*  CO2 29  GLUCOSE 124*  BUN 14  CREATININE 0.73  CALCIUM 9.4   GFR: Estimated Creatinine Clearance: 73.4 mL/min (by C-G formula based on SCr of 0.73 mg/dL). Liver Function Tests: Recent Labs  Lab 05/17/19 1509  AST 34  ALT 23  ALKPHOS 184*  BILITOT 2.6*  PROT 7.8  ALBUMIN 3.3*   No results for input(s): LIPASE, AMYLASE in the last 168 hours. No results for input(s): AMMONIA in the last 168 hours. Coagulation Profile: No results for input(s): INR, PROTIME in the last 168 hours. Cardiac Enzymes: No results for input(s): CKTOTAL, CKMB, CKMBINDEX, TROPONINI in the last 168 hours. BNP (last 3 results) Recent Labs    04/29/19 1450  PROBNP 1,007*   HbA1C: No results for input(s): HGBA1C in the last 72 hours. CBG: No results for input(s): GLUCAP in the last 168 hours. Lipid Profile: No results for input(s): CHOL, HDL, LDLCALC, TRIG, CHOLHDL, LDLDIRECT in the last 72 hours. Thyroid Function Tests: No results for input(s): TSH, T4TOTAL, FREET4, T3FREE, THYROIDAB in the last 72 hours. Anemia Panel: No results for input(s): VITAMINB12, FOLATE, FERRITIN, TIBC, IRON, RETICCTPCT in the last 72 hours. Urine analysis:    Component Value Date/Time   COLORURINE YELLOW 08/25/2018 1500   APPEARANCEUR CLEAR 08/25/2018 1500   LABSPEC 1.014 08/25/2018 1500   PHURINE 6.0 08/25/2018 1500   GLUCOSEU NEGATIVE 08/25/2018 1500   HGBUR NEGATIVE 08/25/2018 1500   BILIRUBINUR NEGATIVE 08/25/2018 1500   KETONESUR NEGATIVE 08/25/2018 1500   PROTEINUR 30 (A) 08/25/2018 1500   NITRITE NEGATIVE 08/25/2018 1500   LEUKOCYTESUR NEGATIVE 08/25/2018 1500    Radiological Exams on Admission: DG Chest Port 1 View  Result Date: 05/17/2019 CLINICAL DATA:  Low potassium  levels, increased  shortness of breath EXAM: PORTABLE CHEST 1 VIEW COMPARISON:  03/10/2019 FINDINGS: Small bilateral pleural effusions, left greater than right. Bilateral diffuse interstitial thickening. No pneumothorax. Stable cardiomegaly. No acute osseous abnormality. IMPRESSION: 1. Findings most consistent with CHF. Electronically Signed   By: Kathreen Devoid   On: 05/17/2019 20:03    EKG: Independently reviewed.  Assessment/Plan Principal Problem:   Hyponatremia Active Problems:   ETOH abuse   Essential hypertension   Cirrhosis of liver with ascites (HCC)   Constrictive pericarditis    1. Hyponatremia - 1. Likely due to diuresis with metolazone 2. As per nephro recs: 1. Holding diuretics for the moment 2. When we restart them, restart lasix and aldactone but not metolazone 3. No IVF nor salt tabs 3. Repeat BMP in AM. 2. EtOH abuse - 1. CIWA 3. Constrictive pericarditis - 1. Will order the cMRI that Dr. Haroldine Laws wanted as outpt while he is here to evaluate if pericardial stripping would be indicated. 4. Cirrhosis of liver - 1. Chronic and stable 2. Holding diuretics as above due to hyponatremia 5. H/o A.Fib - 1. No anticoags due to cirrhosis and h/o bleeding per cards office note.  DVT prophylaxis: Lovenox Code Status: Full Family Communication: No family in room Disposition Plan: Home after admit Consults called: Nephrology Admission status: Place in 68     Landrum Carbonell, [place] [facility]  How to contact the [facility] Attending or Consulting provider [place] or covering provider during after hours [place], for this patient?  1. Check the care team in [facility] and look for a) attending/consulting [facility] provider listed and b) the [facility] team listed 2. Log into [email or web address]  Amion Physician Scheduling and messaging for groups and whole hospitals  On call and physician scheduling software for group practices, residents, hospitalists and other medical providers for call, clinic, rotation and  shift schedules. OnCall Enterprise is a hospital-wide system for scheduling doctors and paging doctors on call. EasyPlot is for scientific plotting and data analysis.  [email or web address]  and use [place]'s universal password to access. If you do not have the password, please contact the hospital operator.  3. Locate the [facility] provider you are looking for under [facility] and page to a number that you can be directly reached. 4. If you still have difficulty reaching the provider, please page the [facility] (Director on Call) for the Hospitalists listed on amion for assistance.  05/17/2019, 10:12 PM

## 2019-05-18 ENCOUNTER — Inpatient Hospital Stay (HOSPITAL_COMMUNITY): Payer: Medicare HMO

## 2019-05-18 ENCOUNTER — Other Ambulatory Visit: Payer: Self-pay

## 2019-05-18 DIAGNOSIS — K7460 Unspecified cirrhosis of liver: Secondary | ICD-10-CM | POA: Diagnosis not present

## 2019-05-18 DIAGNOSIS — T502X5A Adverse effect of carbonic-anhydrase inhibitors, benzothiadiazides and other diuretics, initial encounter: Secondary | ICD-10-CM | POA: Diagnosis present

## 2019-05-18 DIAGNOSIS — R188 Other ascites: Secondary | ICD-10-CM | POA: Diagnosis not present

## 2019-05-18 DIAGNOSIS — F1020 Alcohol dependence, uncomplicated: Secondary | ICD-10-CM | POA: Diagnosis present

## 2019-05-18 DIAGNOSIS — K7031 Alcoholic cirrhosis of liver with ascites: Secondary | ICD-10-CM | POA: Diagnosis not present

## 2019-05-18 DIAGNOSIS — N189 Chronic kidney disease, unspecified: Secondary | ICD-10-CM | POA: Diagnosis present

## 2019-05-18 DIAGNOSIS — E119 Type 2 diabetes mellitus without complications: Secondary | ICD-10-CM | POA: Diagnosis not present

## 2019-05-18 DIAGNOSIS — I4821 Permanent atrial fibrillation: Secondary | ICD-10-CM | POA: Diagnosis present

## 2019-05-18 DIAGNOSIS — E869 Volume depletion, unspecified: Secondary | ICD-10-CM | POA: Diagnosis present

## 2019-05-18 DIAGNOSIS — K7290 Hepatic failure, unspecified without coma: Secondary | ICD-10-CM | POA: Diagnosis present

## 2019-05-18 DIAGNOSIS — K7030 Alcoholic cirrhosis of liver without ascites: Secondary | ICD-10-CM | POA: Diagnosis present

## 2019-05-18 DIAGNOSIS — F1010 Alcohol abuse, uncomplicated: Secondary | ICD-10-CM | POA: Diagnosis present

## 2019-05-18 DIAGNOSIS — K766 Portal hypertension: Secondary | ICD-10-CM | POA: Diagnosis present

## 2019-05-18 DIAGNOSIS — Z87892 Personal history of anaphylaxis: Secondary | ICD-10-CM | POA: Diagnosis not present

## 2019-05-18 DIAGNOSIS — E871 Hypo-osmolality and hyponatremia: Secondary | ICD-10-CM | POA: Diagnosis present

## 2019-05-18 DIAGNOSIS — H9190 Unspecified hearing loss, unspecified ear: Secondary | ICD-10-CM | POA: Diagnosis present

## 2019-05-18 DIAGNOSIS — I300 Acute nonspecific idiopathic pericarditis: Secondary | ICD-10-CM | POA: Diagnosis not present

## 2019-05-18 DIAGNOSIS — I311 Chronic constrictive pericarditis: Secondary | ICD-10-CM | POA: Diagnosis present

## 2019-05-18 DIAGNOSIS — Z882 Allergy status to sulfonamides status: Secondary | ICD-10-CM | POA: Diagnosis not present

## 2019-05-18 DIAGNOSIS — I130 Hypertensive heart and chronic kidney disease with heart failure and stage 1 through stage 4 chronic kidney disease, or unspecified chronic kidney disease: Secondary | ICD-10-CM | POA: Diagnosis present

## 2019-05-18 DIAGNOSIS — Z79899 Other long term (current) drug therapy: Secondary | ICD-10-CM | POA: Diagnosis not present

## 2019-05-18 DIAGNOSIS — Z888 Allergy status to other drugs, medicaments and biological substances status: Secondary | ICD-10-CM | POA: Diagnosis not present

## 2019-05-18 DIAGNOSIS — I5033 Acute on chronic diastolic (congestive) heart failure: Secondary | ICD-10-CM | POA: Diagnosis present

## 2019-05-18 DIAGNOSIS — Z8701 Personal history of pneumonia (recurrent): Secondary | ICD-10-CM | POA: Diagnosis not present

## 2019-05-18 DIAGNOSIS — Z8249 Family history of ischemic heart disease and other diseases of the circulatory system: Secondary | ICD-10-CM | POA: Diagnosis not present

## 2019-05-18 DIAGNOSIS — I2510 Atherosclerotic heart disease of native coronary artery without angina pectoris: Secondary | ICD-10-CM | POA: Diagnosis present

## 2019-05-18 DIAGNOSIS — Z20822 Contact with and (suspected) exposure to covid-19: Secondary | ICD-10-CM | POA: Diagnosis present

## 2019-05-18 LAB — BASIC METABOLIC PANEL WITH GFR
Anion gap: 11 (ref 5–15)
BUN: 12 mg/dL (ref 8–23)
CO2: 29 mmol/L (ref 22–32)
Calcium: 9.1 mg/dL (ref 8.9–10.3)
Chloride: 79 mmol/L — ABNORMAL LOW (ref 98–111)
Creatinine, Ser: 0.76 mg/dL (ref 0.61–1.24)
GFR calc Af Amer: >60 mL/min
GFR calc non Af Amer: >60 mL/min
Glucose, Bld: 119 mg/dL — ABNORMAL HIGH (ref 70–99)
Potassium: 3.8 mmol/L (ref 3.5–5.1)
Sodium: 119 mmol/L — CL (ref 135–145)

## 2019-05-18 LAB — SARS CORONAVIRUS 2 (TAT 6-24 HRS): SARS Coronavirus 2: NEGATIVE

## 2019-05-18 LAB — OSMOLALITY: Osmolality: 251 mOsm/kg — ABNORMAL LOW (ref 275–295)

## 2019-05-18 LAB — BASIC METABOLIC PANEL
Anion gap: 13 (ref 5–15)
BUN: 12 mg/dL (ref 8–23)
CO2: 26 mmol/L (ref 22–32)
Calcium: 8.9 mg/dL (ref 8.9–10.3)
Chloride: 80 mmol/L — ABNORMAL LOW (ref 98–111)
Creatinine, Ser: 0.7 mg/dL (ref 0.61–1.24)
GFR calc Af Amer: >60 mL/min (ref >60)
GFR calc non Af Amer: >60 mL/min (ref >60)
Glucose, Bld: 152 mg/dL — ABNORMAL HIGH (ref 70–99)
Potassium: 3.7 mmol/L (ref 3.5–5.1)
Sodium: 119 mmol/L — CL (ref 135–145)

## 2019-05-18 MED ORDER — FUROSEMIDE 10 MG/ML IJ SOLN
40.0000 mg | Freq: Once | INTRAMUSCULAR | Status: AC
  Administered 2019-05-18: 40 mg via INTRAVENOUS
  Filled 2019-05-18: qty 4

## 2019-05-18 MED ORDER — LORAZEPAM 2 MG/ML IJ SOLN
1.0000 mg | Freq: Once | INTRAMUSCULAR | Status: AC
  Administered 2019-05-18: 15:00:00 1 mg via INTRAVENOUS
  Filled 2019-05-18: qty 1

## 2019-05-18 MED ORDER — LINACLOTIDE 145 MCG PO CAPS
145.0000 ug | ORAL_CAPSULE | Freq: Every day | ORAL | Status: DC
  Administered 2019-05-19: 145 ug via ORAL
  Filled 2019-05-18 (×3): qty 1

## 2019-05-18 MED ORDER — TOLVAPTAN 15 MG PO TABS
15.0000 mg | ORAL_TABLET | Freq: Once | ORAL | Status: AC
  Administered 2019-05-18: 15 mg via ORAL
  Filled 2019-05-18: qty 1

## 2019-05-18 MED ORDER — GADOBUTROL 1 MMOL/ML IV SOLN
8.0000 mL | Freq: Once | INTRAVENOUS | Status: AC | PRN
  Administered 2019-05-18: 17:00:00 8 mL via INTRAVENOUS

## 2019-05-18 NOTE — ED Notes (Signed)
Pt requesting "salt pills" and feels that his sodium is dropping. Provider notified.

## 2019-05-18 NOTE — Progress Notes (Signed)
Pt arrived on the unit. Alert oriented X2.Easy reoriented. VS stable. Ambulated to the bedroom with walker/one assist. Forgetful and hard of hearing. Bed in low position, alarms are on, call bell in reach. Belongings at the bedside, (fenny bag with different small staff, clothes, hat, watch, phone and charger)

## 2019-05-18 NOTE — ED Notes (Signed)
Gave pt crackers and cup of ice

## 2019-05-18 NOTE — Progress Notes (Signed)
Admit: 05/17/2019 LOS: 0  69M with hypervolemic hyponatremia, Cirrhosis and CHF  Subjective:  . SNa 119 this AM . He req Salt tabs, discussed  . 2+ [name]  Sodium (mmol/L)  Date Value  05/18/2019 119 (LL)  05/17/2019 121 (L)  04/29/2019 128 (L)  04/21/2019 127 (L)  04/21/2019 129 (L)  04/21/2019 128 (L)  04/18/2019 126 (L)  03/11/2019 124 (L)  03/10/2019 126 (L)  03/09/2019 129 (L)  03/08/2019 128 (L)  08/31/2018 124 (L)  ]  No intake/output data recorded.  Filed Weights   05/17/19 1955  Weight: 78.1 kg    Scheduled Meds: . enoxaparin (LOVENOX) injection  40 mg Subcutaneous Q24H  . folic acid  1 mg Oral Daily  . multivitamin with minerals  1 tablet Oral Daily  . thiamine  100 mg Oral Daily   Or  . thiamine  100 mg Intravenous Daily   Continuous Infusions: PRN Meds:.acetaminophen **OR** acetaminophen, LORazepam **OR** LORazepam, ondansetron **OR** ondansetron (ZOFRAN) IV  Current Labs: reviewed   Results for [name], [name] (MRN [id number]) as of 05/18/2019 15:08  Ref. Range 05/17/2019 22:07  Osmolality, Urine Latest Ref Range: 300 - 900 mOsm/kg 488  Sodium, Urine Latest Units: mmol/L 121  Results for [name], [name] (MRN [id number]) as of 05/18/2019 15:08  Ref. Range 05/18/2019 05:10  Osmolality Latest Ref Range: 275 - 295 mOsm/kg 251 (L)    Physical Exam:  Blood pressure 112/76, pulse 90, temperature 98.2 F (36.8 C), temperature source Oral, resp. rate 18, height 5\' 7"  (1.702 m), weight 78.1 kg, SpO2 98 %. NAD, aao x3 nonfocal RRR CTAB 2+ [name] S/nt, no overt Large v ol Ascites  A 1. Acute on Chronic Hypervolemic Hypotonic hyponatremia related to CHF and Cirrhosis; worsened by recent metolazone use 2. Cirrhosis from ETOH 3. Constrictive pericarditis, AHF following  P . Volume status isn't horrible, so much of this might be thiazide related . q6h Na . Salt tabs in cirrhosis and CHF setting is not a good idea . Add on 1.2L fluid restriction . prob can  restart lasix/aldactone tomorrow  . Strict I/Os, daily weights . Medication Issues; o Preferred narcotic agents for pain control are hydromorphone, fentanyl, and methadone. Morphine should not be used.  o Baclofen should be avoided o Avoid oral sodium phosphate and magnesium citrate based laxatives / bowel preps    Pearson Grippe MD 05/18/2019, 3:05 PM  Recent Labs  Lab 05/17/19 1509 05/17/19 2053 05/18/19 0426  NA 121*  --  119*  K 4.6  --  3.7  CL 82*  --  80*  CO2 29  --  26  GLUCOSE 124*  --  152*  BUN 14  --  12  CREATININE 0.73  --  0.70  CALCIUM 9.4  --  8.9  PHOS  --  3.9  --    Recent Labs  Lab 05/17/19 1509  WBC 6.2  HGB 12.1*  HCT 34.4*  MCV 86.9  PLT 218

## 2019-05-18 NOTE — ED Notes (Signed)
Adjusted pt in bed. Gave pt coffee.

## 2019-05-18 NOTE — ED Notes (Signed)
Attempted to call report

## 2019-05-18 NOTE — Progress Notes (Signed)
Progress Note    [name]  PJA:[id number] DOB: 07-04-1940  DOA: 05/17/2019 PCP: Shon Baton, MD    Brief Narrative:     Medical records reviewed and are as summarized below:  [name] is an 78 y.o. male with medical history significant of EtOH cirrhosis, HTN, A.Flutter s/p ablation, constrictive pericarditis.  Patient has h/o hyponatremia with sodiums in the upper 120s at baseline.  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  Took dose of Metalozone yesterday, and produced '4 gallons' of urine.  Feeling dry now.  Says he is down 20-30 lbs of fluid since this med initiated 2 weeks ago, but isnt entirely clear on timeline.  Thinks LE edema is improved.  Went to cards office today, lab work there showed sodium was down to 121 from 128 on 12/11.  Assessment/Plan:   Principal Problem:   Hyponatremia Active Problems:   ETOH abuse   Essential hypertension   Cirrhosis of liver with ascites (HCC)   Constrictive pericarditis   Hyponatremia - -Likely due to over- diuresis with metolazone -nephro consult appreciated -Na q 6 hours AVOID SALT TABS -fluid restriction  EtOH abuse - CIWA  Constrictive pericarditis - -cardiology consult appreciated -ordered cMRI that Dr. Haroldine Laws wanted as outpt while he is here to evaluate if pericardial stripping would be indicated.  Cirrhosis of liver - -Chronic and stable -Holding diuretics as above due to hyponatremia  H/o A.Fib - -No anticoags due to cirrhosis and h/o bleeding per cards office note.   Family Communication/Anticipated D/C date and plan/Code Status   DVT prophylaxis: Lovenox ordered. Code Status: Full Code.  Family Communication:  Disposition Plan:    Medical Consultants:   Nephrology cardiology    Subjective:   Going to MRI  Objective:     Vitals:   05/18/19 0615 05/18/19 0645 05/18/19 1512 05/18/19 1513  BP: 137/80 112/76 136/88   Pulse:    88  Resp:    17  Temp:      TempSrc:      SpO2:    97%  Weight:      Height:        Intake/Output Summary (Last 24 hours) at 05/18/2019 1612 Last data filed at 05/18/2019 1525 Gross per 24 hour  Intake --  Output 660 ml  Net -660 ml   Filed Weights   05/17/19 1955  Weight: 78.1 kg    Exam: On way to MRI  Data Reviewed:   I have personally reviewed following labs and imaging studies:  Labs: Labs show the following:   Basic Metabolic Panel: Recent Labs  Lab 05/17/19 1509 05/17/19 2053 05/18/19 0426  NA 121*  --  119*  K 4.6  --  3.7  CL 82*  --  80*  CO2 29  --  26  GLUCOSE 124*  --  152*  BUN 14  --  12  CREATININE 0.73  --  0.70  CALCIUM 9.4  --  8.9  MG  --  1.8  --   PHOS  --  3.9  --    GFR Estimated Creatinine Clearance: 73.4 mL/min (by C-G formula based on SCr of 0.7 mg/dL). Liver Function Tests: Recent Labs  Lab 05/17/19 1509  AST 34  ALT 23  ALKPHOS 184*  BILITOT 2.6*  PROT 7.8  ALBUMIN 3.3*  No results for input(s): LIPASE, AMYLASE in the last 168 hours. No results for input(s): AMMONIA in the last 168 hours. Coagulation profile No results for input(s): INR, PROTIME in the last 168 hours.  CBC: Recent Labs  Lab 05/17/19 1509  WBC 6.2  HGB 12.1*  HCT 34.4*  MCV 86.9  PLT 218   Cardiac Enzymes: No results for input(s): CKTOTAL, CKMB, CKMBINDEX, TROPONINI in the last 168 hours. BNP (last 3 results) Recent Labs    04/29/19 1450  PROBNP 1,007*   CBG: No results for input(s): GLUCAP in the last 168 hours. D-Dimer: No results for input(s): DDIMER in the last 72 hours. Hgb A1c: No results for input(s): HGBA1C in the last 72 hours. Lipid Profile: No results for input(s): CHOL, HDL, LDLCALC, TRIG, CHOLHDL, LDLDIRECT in the last 72 hours. Thyroid function studies: No results for input(s): TSH, T4TOTAL, T3FREE,  THYROIDAB in the last 72 hours.  Invalid input(s): FREET3 Anemia work up: No results for input(s): VITAMINB12, FOLATE, FERRITIN, TIBC, IRON, RETICCTPCT in the last 72 hours. Sepsis Labs: Recent Labs  Lab 05/17/19 1509  WBC 6.2    Microbiology Recent Results (from the past 240 hour(s))  SARS CORONAVIRUS 2 (TAT 6-24 HRS) Nasopharyngeal Nasopharyngeal Swab     Status: None   Collection Time: 05/17/19  8:41 PM   Specimen: Nasopharyngeal Swab  Result Value Ref Range Status   SARS Coronavirus 2 NEGATIVE NEGATIVE Final    Comment: (NOTE) SARS-CoV-2 target nucleic acids are NOT DETECTED. The SARS-CoV-2 RNA is generally detectable in upper and lower respiratory specimens during the acute phase of infection. Negative results do not preclude SARS-CoV-2 infection, do not rule out co-infections with other pathogens, and should not be used as the sole basis for treatment or other patient management decisions. Negative results must be combined with clinical observations, patient history, and epidemiological information. The expected result is Negative. Fact Sheet for Patients: SugarRoll.be Fact Sheet for Healthcare Providers: https://www.woods-mathews.com/ This test is not yet approved or cleared by the [place] FDA and  has been authorized for detection and/or diagnosis of SARS-CoV-2 by FDA under an Emergency Use Authorization (EUA). This EUA will remain  in effect (meaning this test can be used) for the duration of the COVID-19 declaration under Section 56 4(b)(1) of the Act, 21 U.S.C. section 360bbb-3(b)(1), unless the authorization is terminated or revoked sooner. Performed at [facility], [place] [address] [place], [place] [address]     Procedures and diagnostic studies:  DG Chest Port 1 View  Result Date: 05/17/2019 CLINICAL DATA:  Low potassium levels, increased shortness of breath EXAM: PORTABLE CHEST 1 VIEW COMPARISON:   03/10/2019 FINDINGS: Small bilateral pleural effusions, left greater than right. Bilateral diffuse interstitial thickening. No pneumothorax. Stable cardiomegaly. No acute osseous abnormality. IMPRESSION: 1. Findings most consistent with CHF. Electronically Signed   By: Kathreen Devoid   On: 05/17/2019 20:03    Medications:   . enoxaparin (LOVENOX) injection  40 mg Subcutaneous Q24H  . folic acid  1 mg Oral Daily  . multivitamin with minerals  1 tablet Oral Daily  . thiamine  100 mg Oral Daily   Or  . thiamine  100 mg Intravenous Daily   Continuous Infusions:   LOS: 0 days   Geradine Girt  [facility]   How to contact the [facility] Attending or Consulting provider [place] or covering provider during after hours [place], for this patient?  1. Check the care team in [facility]  and look for a) attending/consulting [place] provider listed and b) the [facility] team listed 2. Log into [email or web address] and use [place]'s universal password to access. If you do not have the password, please contact the hospital operator. 3. Locate the [facility] provider you are looking for under [facility] and page to a number that you can be directly reached. 4. If you still have difficulty reaching the provider, please page the [facility] (Director on Call) for the Hospitalists listed on amion for assistance.  05/18/2019, 4:12 PM

## 2019-05-18 NOTE — ED Notes (Signed)
Transported to MRI

## 2019-05-18 NOTE — ED Notes (Signed)
ED TO INPATIENT HANDOFF REPORT  ED Nurse Name and Phone #: William Hamburger, RN [phone]  S Name/Age/Gender [name] 78 y.o. male Room/Bed: 017C/017C  Code Status   Code Status: Full Code  Home/SNF/Other Home Patient oriented to: self, place, time and situation Is this baseline? No   Triage Complete: Triage complete  Chief Complaint Hyponatremia [E87.1]  Triage Note Pt here from home told to come to the ED fro a low NA level , no other complaint sat this time     Allergies Allergies  Allergen Reactions  . Lisinopril Anaphylaxis and Other (See Comments)    Hyperkalemia, Dizziness   . Lipitor [Atorvastatin] Other (See Comments)    Marked leg fatigue  . Sulfonamide Derivatives Other (See Comments)    UNSPECIFIED REACTION      Level of Care/Admitting Diagnosis ED Disposition    ED Disposition Condition Comment   Admit  Hospital Area: [facility] [[id number]]  Level of Care: Med-Surg [16]  I expect the patient will be discharged within 24 hours: No (not a candidate for 5C-Observation unit)  Covid Evaluation: Asymptomatic Screening Protocol (No Symptoms)  Diagnosis: Hyponatremia [893810]  Admitting Physician: Etta Quill [phone]  Attending Physician: Etta Quill [[id number]]  PT Class (Do Not Modify): Observation [104]  PT Acc Code (Do Not Modify): Observation [10022]       B Medical/Surgery History Past Medical History:  Diagnosis Date  . Acute respiratory failure with hypoxia ([place]) 03/09/2019  . Adenomatous polyps 06/2004  . Agent orange exposure   . Alcoholic cirrhosis of liver with ascites ([place])   . Alcoholism ([place])   . Allergy   . Anxiety    PTSD- no meds  . Atrial fibrillation ([place])   . Atrial flutter ([place])    had ablation   . CAD in native artery    cath 04/2019: 99% stenosis of ostium of OM>> small vessel>> RX tx  . Chronic constrictive pericarditis    a. cath 04/2019 - moderate elevated L heart pressure; severely edevated R heart adn  pulomary artery pressures   . Chronic kidney disease    "beginnings of kidney failure"- 3-4 years ago  . Depression   . Diabetes mellitus without complication (H[facility])    no meds  . Diverticulosis   . GERD (gastroesophageal reflux disease)   . Hemorrhoids   . Hiatal hernia   . Hx of hernia repair   . Hyperlipidemia   . Hypertension   . Hyponatremia 08/2018  . Iron deficiency anemia   . Lung nodules    bilateral  . Pedal edema   . Pneumonia    06-2016  . PTSD (post-traumatic stress disorder)   . Substance abuse ([place])    alcohol use  . Ulcer   . Varicose veins    Past Surgical History:  Procedure Laterality Date  . ATRIAL ABLATION SURGERY    . COLONOSCOPY    . FUDUCIAL PLACEMENT N/A 07/26/2018   Procedure: PLACEMENT OF FUDUCIAL;  Surgeon: [name], MD;  Location: [facility];  Service: Thoracic;  Laterality: N/A;  . IR PARACENTESIS  10/28/2016  . IR PARACENTESIS  11/12/2016  . IR PARACENTESIS  12/02/2016  . IR PARACENTESIS  12/10/2016  . IR PARACENTESIS  12/30/2016  . IR PARACENTESIS  01/27/2017  . IR PARACENTESIS  09/09/2017  . IR PARACENTESIS  03/04/2018  . IR PARACENTESIS  04/08/2018  . IR PARACENTESIS  05/10/2018  . IR PARACENTESIS  05/21/2018  . IR PARACENTESIS  06/16/2018  . IR PARACENTESIS  07/23/2018  . IR PARACENTESIS  10/06/2018  . IR PARACENTESIS  10/28/2018  . IR PARACENTESIS  01/12/2019  . IR PARACENTESIS  01/28/2019  . IR PARACENTESIS  02/04/2019  . IR PARACENTESIS  02/15/2019  . IR PARACENTESIS  02/28/2019  . IR PARACENTESIS  03/22/2019  . IR PARACENTESIS  04/04/2019  . IR PARACENTESIS  04/15/2019  . IR RADIOLOGIST EVAL & MGMT  06/22/2018  . IR THORACENTESIS ASP PLEURAL SPACE W/IMG GUIDE  03/09/2019  . IR THORACENTESIS ASP PLEURAL SPACE W/IMG GUIDE  03/10/2019  . POLYPECTOMY    . RIGHT/LEFT HEART CATH AND CORONARY ANGIOGRAPHY N/A 04/21/2019   Procedure: RIGHT/LEFT HEART CATH AND CORONARY ANGIOGRAPHY;  Surgeon: Nelva Bush, MD;  Location: [facility];   Service: Cardiovascular;  Laterality: N/A;  . THORACENTESIS  03/09/2019  . TONSILLECTOMY    . UMBILICAL HERNIA REPAIR    . VARICOSE VEIN SURGERY     x4  . VIDEO BRONCHOSCOPY WITH ENDOBRONCHIAL NAVIGATION N/A 07/26/2018   Procedure: VIDEO BRONCHOSCOPY WITH ENDOBRONCHIAL NAVIGATION;  Surgeon: [name], MD;  Location: [facility];  Service: Thoracic;  Laterality: N/A;     A IV Location/Drains/Wounds Patient Lines/Drains/Airways Status   Active Line/Drains/Airways    Name:   Placement date:   Placement time:   Site:   Days:   Peripheral IV 05/17/19 Right Antecubital   05/17/19    --    Antecubital   1   Wound / Incision (Open or Dehisced) 03/09/19 Non-pressure wound Toe (Comment  which one) Right black wound at the top area of the right great toe   03/09/19    1758    Toe (Comment  which one)   70          Intake/Output Last 24 hours No intake or output data in the 24 hours ending 05/18/19 0141  Labs/Imaging Results for orders placed or performed during the hospital encounter of 05/17/19 (from the past 48 hour(s))  CBC     Status: Abnormal   Collection Time: 05/17/19  3:09 PM  Result Value Ref Range   WBC 6.2 4.0 - 10.5 K/uL   RBC 3.96 (L) 4.22 - 5.81 MIL/uL   Hemoglobin 12.1 (L) 13.0 - 17.0 g/dL   HCT 34.4 (L) 39.0 - 52.0 %   MCV 86.9 80.0 - 100.0 fL   MCH 30.6 26.0 - 34.0 pg   MCHC 35.2 30.0 - 36.0 g/dL   RDW 15.9 (H) 11.5 - 15.5 %   Platelets 218 150 - 400 K/uL   nRBC 0.0 0.0 - 0.2 %    Comment: Performed at [facility], [address], [place], [place] [address]  Comprehensive metabolic panel     Status: Abnormal   Collection Time: 05/17/19  3:09 PM  Result Value Ref Range   Sodium 121 (L) 135 - 145 mmol/L   Potassium 4.6 3.5 - 5.1 mmol/L   Chloride 82 (L) 98 - 111 mmol/L   CO2 29 22 - 32 mmol/L   Glucose, Bld 124 (H) 70 - 99 mg/dL   BUN 14 8 - 23 mg/dL   Creatinine, Ser 0.73 0.61 - 1.24 mg/dL   Calcium 9.4 8.9 - 10.3 mg/dL   Total Protein 7.8 6.5 - 8.1  g/dL   Albumin 3.3 (L) 3.5 - 5.0 g/dL   AST 34 15 - 41 U/L   ALT 23 0 - 44 U/L   Alkaline Phosphatase 184 (H) 38 -  126 U/L   Total Bilirubin 2.6 (H) 0.3 - 1.2 mg/dL   GFR calc non Af Amer >60 >60 mL/min   GFR calc Af Amer >60 >60 mL/min   Anion gap 10 5 - 15    Comment: Performed at [facility] [address], [place], [place] [address]  Brain natriuretic peptide     Status: Abnormal   Collection Time: 05/17/19  3:09 PM  Result Value Ref Range   B Natriuretic Peptide 152.2 (H) 0.0 - 100.0 pg/mL    Comment: Performed at [facility] [address], [place], [place] [address]  Ethanol     Status: None   Collection Time: 05/17/19  8:53 PM  Result Value Ref Range   Alcohol, Ethyl (B) <10 <10 mg/dL    Comment: (NOTE) Lowest detectable limit for serum alcohol is 10 mg/dL. For medical purposes only. Performed at [facility], [place] [address], [place], [place] [address]   Magnesium     Status: None   Collection Time: 05/17/19  8:53 PM  Result Value Ref Range   Magnesium 1.8 1.7 - 2.4 mg/dL    Comment: Performed at [facility], [place] [address], [place], [place] [address]  Phosphorus     Status: None   Collection Time: 05/17/19  8:53 PM  Result Value Ref Range   Phosphorus 3.9 2.5 - 4.6 mg/dL    Comment: Performed at [facility], [place] [address], [place], [place] [address]  Osmolality, urine     Status: None   Collection Time: 05/17/19 10:07 PM  Result Value Ref Range   Osmolality, Ur 488 300 - 900 mOsm/kg    Comment: Performed at [facility] [address] [place], [place] [address]  Sodium, urine, random     Status: None   Collection Time: 05/17/19 10:07 PM  Result Value Ref Range   Sodium, Ur 121 mmol/L    Comment: Performed at [facility] [address], [place], [place] [address]   DG Chest Port 1 View  Result Date: 05/17/2019 CLINICAL DATA:  Low potassium levels, increased shortness of breath EXAM: PORTABLE CHEST 1 VIEW  COMPARISON:  03/10/2019 FINDINGS: Small bilateral pleural effusions, left greater than right. Bilateral diffuse interstitial thickening. No pneumothorax. Stable cardiomegaly. No acute osseous abnormality. IMPRESSION: 1. Findings most consistent with CHF. Electronically Signed   By: Kathreen Devoid   On: 05/17/2019 20:03    Pending Labs Unresulted Labs (From admission, onward)    Start     Ordered   05/18/19 9323  Basic metabolic panel  Tomorrow morning,   R     05/17/19 2208   05/17/19 2207  Osmolality  Add-on,   AD     05/17/19 2206   05/17/19 2041  SARS CORONAVIRUS 2 (TAT 6-24 HRS) Nasopharyngeal Nasopharyngeal Swab  (Tier 3 (TAT 6-24 hrs))  Once,   STAT    Question Answer Comment  Is this test for diagnosis or screening Screening   Symptomatic for COVID-19 as defined by CDC No   Hospitalized for COVID-19 No   Admitted to ICU for COVID-19 No   Previously tested for COVID-19 Yes   Resident in a congregate (group) care setting No   Employed in healthcare setting No      05/17/19 2040          Vitals/Pain Today's Vitals   05/18/19 0045 05/18/19 0100 05/18/19 0115 05/18/19 0130  BP: 111/68 102/69  104/69 103/66  Pulse:      Resp: 17 17 16 16   Temp:      TempSrc:      SpO2:    97%  Weight:      Height:      PainSc:        Isolation Precautions No active isolations  Medications Medications  LORazepam (ATIVAN) tablet 1-4 mg (has no administration in time range)    Or  LORazepam (ATIVAN) injection 1-4 mg (has no administration in time range)  thiamine tablet 100 mg (has no administration in time range)    Or  thiamine (B-1) injection 100 mg (has no administration in time range)  folic acid (FOLVITE) tablet 1 mg (has no administration in time range)  multivitamin with minerals tablet 1 tablet (has no administration in time range)  acetaminophen (TYLENOL) tablet 650 mg (has no administration in time range)    Or  acetaminophen (TYLENOL) suppository 650 mg (has no  administration in time range)  ondansetron (ZOFRAN) tablet 4 mg (has no administration in time range)    Or  ondansetron (ZOFRAN) injection 4 mg (has no administration in time range)  enoxaparin (LOVENOX) injection 40 mg (has no administration in time range)    Mobility walks with person assist Moderate fall risk   Focused Assessments Cardiac Assessment Handoff:    Lab Results  Component Value Date   CKTOTAL 38 10/07/2012   TROPONINI <0.03 02/17/2016   Lab Results  Component Value Date   DDIMER 3.66 (H) 07/16/2018   Does the Patient currently have chest pain? No  , Neuro Assessment Handoff:  Swallow screen pass? Yes          Neuro Assessment:   Neuro Checks:      Last Documented NIHSS Modified Score:   Has TPA been given? No If patient is a Neuro Trauma and patient is going to OR before floor call report to [place] nurse: [phone] or [phone]  , Renal Assessment Handoff:  Hemodialysis Schedule:  Last Hemodialysis date and time:    Restricted appendage:    , Pulmonary Assessment Handoff:  Lung sounds:   O2 Device: Room Air        R Recommendations: See Admitting Provider Note  Report given to:   Additional Notes:

## 2019-05-18 NOTE — Progress Notes (Addendum)
Advanced Heart Failure Rounding Note  PCP-Cardiologist: Ena Dawley, MD   Subjective:    [name] is a 78 y.o. 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   Diuretics on hold. Nephrology following.    Objective:   Weight Range: 78.1 kg Body mass index is 26.97 kg/m.   Vital Signs:   Pulse Rate:  [75-90] 88 (12/30 1513) Resp:  [13-22] 17 (12/30 1513) BP: (102-159)/(62-92) 136/88 (12/30 1512) SpO2:  [97 %-100 %] 97 % (12/30 1513) Weight:  [78.1 kg] 78.1 kg (12/29 1955)    Weight change: Filed Weights   05/17/19 1955  Weight: 78.1 kg    Intake/Output:   Intake/Output Summary (Last 24 hours) at 05/18/2019 1552 Last data filed at 05/18/2019 1525 Gross per 24 hour  Intake --  Output 660 ml  Net -660 ml      Physical Exam    General:  Well appearing. No resp difficulty HEENT: Normal Neck: Supple. JVP . Carotids 2+ bilat; no bruits. No lymphadenopathy or thyromegaly appreciated. Cor: PMI nondisplaced. Regular rate & rhythm. No rubs, gallops or murmurs. Lungs: Clear Abdomen: Soft, nontender, nondistended. No hepatosplenomegaly. No bruits or masses. Good bowel sounds. Extremities: No cyanosis, clubbing, rash, edema Neuro: Alert & orientedx3, cranial nerves grossly intact. moves all 4 extremities w/o difficulty. Affect pleasant    EKG    afib 84 bpm   Labs    CBC Recent Labs    05/17/19 1509  WBC 6.2  HGB 12.1*  HCT 34.4*  MCV 86.9  PLT 712   Basic Metabolic Panel Recent Labs    05/17/19 1509 05/17/19 2053 05/18/19 0426  NA 121*  --  119*  K 4.6  --  3.7  CL 82*  --  80*  CO2 29  --  26  GLUCOSE 124*  --  152*  BUN 14  --  12  CREATININE 0.73  --  0.70  CALCIUM 9.4  --  8.9  MG  --  1.8  --   PHOS  --  3.9  --    Liver Function Tests Recent Labs    05/17/19 1509  AST  34  ALT 23  ALKPHOS 184*  BILITOT 2.6*  PROT 7.8  ALBUMIN 3.3*   No results for input(s): LIPASE, AMYLASE in the last 72 hours. Cardiac Enzymes No results for input(s): CKTOTAL, CKMB, CKMBINDEX, TROPONINI in the last 72 hours.  BNP: BNP (last 3 results) Recent Labs    08/25/18 1316 03/09/19 0034 05/17/19 1509  BNP 586.3* 303.9* 152.2*    ProBNP (last 3 results) Recent Labs    04/29/19 1450  PROBNP 1,007*     D-Dimer No results for input(s): DDIMER in the last 72 hours. Hemoglobin A1C No results for input(s): HGBA1C in the last 72 hours. Fasting Lipid Panel No results for input(s): CHOL, HDL, LDLCALC, TRIG, CHOLHDL, LDLDIRECT in the last 72 hours. Thyroid Function Tests No results for input(s): TSH, T4TOTAL, T3FREE, THYROIDAB in the last 72 hours.  Invalid input(s): FREET3  Other results:   Imaging    DG Chest Port 1 View  Result Date: 05/17/2019 CLINICAL DATA:  Low potassium levels, increased shortness of breath EXAM: PORTABLE CHEST 1 VIEW COMPARISON:  03/10/2019 FINDINGS: Small bilateral pleural effusions, left greater than right. Bilateral diffuse interstitial thickening. No pneumothorax. Stable cardiomegaly.  No acute osseous abnormality. IMPRESSION: 1. Findings most consistent with CHF. Electronically Signed   By: Kathreen Devoid   On: 05/17/2019 20:03      Medications:     Scheduled Medications:  enoxaparin (LOVENOX) injection  40 mg Subcutaneous F09N   folic acid  1 mg Oral Daily   multivitamin with minerals  1 tablet Oral Daily   thiamine  100 mg Oral Daily   Or   thiamine  100 mg Intravenous Daily     Infusions:   PRN Medications:  acetaminophen **OR** acetaminophen, LORazepam **OR** LORazepam, ondansetron **OR** ondansetron (ZOFRAN) IV    Patient Profile   [name] is a 78 y.o. 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   Assessment/Plan    1. Hypervolemic hyponatremia  -- in the setting of cirrhosis and HF + diuretics for CHF (lasix, aldactone + metolazone) -- Nephrology following and advises against use of salt tabs as this will lead to worsening volume retention and overload ---Diuretics on hold.  -- monitor closely   2. 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  - diuretics on hold per above. - should resume diuretics once Na correcting and watch in house to ensure no recurrence. - cirrhosis likely contributing - continue compression hose -  obtaining cMRI to define degree of pericardial calcification.   3. Permanent AF - rate controlled. HR in the 80s. Not on AC due to cirrhosis and previous bleeding  4. CAD - branch vessel disease - no angina - not on ASA or statin due to liver disease and previous bleeding  4. Cirrhosis - suspect combination of ETOH and RV failure - fluid management as above - Paracentesis as needed   Length of Stay: 0  Brittainy Simmons, PA-C  05/18/2019, 3:52 PM  Advanced Heart Failure Team Pager [phone] (M-F; 7a - 4p)  Please contact [facility] for night-coverage after hours (4p -7a ) and weekends on [email or web address]  Patient seen with PA, agree with the above note.   He has persistent hyponatremia, Na 119 this afternoon despite fluid restriction.  All diuretics were stopped.  He is alert/oriented and answers questions appropriately.   I reviewed his cardiac MRI from this afternoon.  This shows clear signs of constrictive pericarditis.   General: NAD Neck: JVP 10-11 cm with HJR, no thyromegaly or thyroid nodule.  Lungs: Clear to auscultation bilaterally with normal respiratory effort. CV: Nondisplaced PMI.  Heart regular S1/S2, there is a pericardial knock present.  1+ edema 1/2 to knees bilaterally.   Abdomen: Soft, nontender, no hepatosplenomegaly, mild  distention.  Skin: Intact without lesions or rashes.  Neurologic: Alert and oriented x 3.  Psych: Normal affect. Extremities: No clubbing or cyanosis.  HEENT: Normal.   1. Hyponatremia: Suspect hypervolemic hyponatremia worsened by cirrhosis and metolazone use at home.  Na has been consistently 119-120 since admission.  He is not obviously confused.  No h/o seizure activity.  - Continue 1.2 L fluid restriction.  - tolvaptan 15 mg po x 1 and follow response.  - Follow Na closely.  2. Acute on chronic diastolic CHF: Due to constrictive pericarditis.  Cardiac MRI is highly suggestive of constrictive pericarditis, there is pericardial calcification on CT from 9/20. He has a pericardial knock.  On exam, he is volume overloaded though suspect he has  been worse in the recent past.  He was taking metolazone, Lasix and spironolactone at home.  Diuretics were held at admission with hyponatremia.  - He is getting tolvaptan as above.  - Will challenge with Lasix 40 mg IV x 1, will aim for gentle diuresis.  - At this point, suspect pericardial constriction is chronic and there is unlikely to be a role for colchicine or NSAIDs.  - I will ask TCTS to see him to evaluate for pericardial stripping. Probably not surgical candidate with what appears to be advanced cirrhosis.  3. Cirrhosis: Cirrhotic liver with h/o ascites and multiple paracenteses.  Suspect h/o ETOH plays a role, but suspect RV failure in setting of constrictive pericarditis also plays a role.  His abdomen is not tense.  4. CAD: 10/20 cath showed 99% OM3 stenosis treated medically.  This fits the small area of inferolateral subendocardial LGE seen on cMRI.  5. Atrial fibrillation: Permanent, rate is controlled. He has not been anticoagulated due to GI bleeding and cirrhosis.   Loralie Champagne 05/18/2019

## 2019-05-18 NOTE — ED Notes (Signed)
Provider at bedside

## 2019-05-18 NOTE — ED Notes (Signed)
MS   Breakfast ordered  

## 2019-05-19 DIAGNOSIS — E119 Type 2 diabetes mellitus without complications: Secondary | ICD-10-CM

## 2019-05-19 DIAGNOSIS — I300 Acute nonspecific idiopathic pericarditis: Secondary | ICD-10-CM

## 2019-05-19 DIAGNOSIS — I5033 Acute on chronic diastolic (congestive) heart failure: Secondary | ICD-10-CM | POA: Diagnosis present

## 2019-05-19 LAB — BASIC METABOLIC PANEL
Anion gap: 13 (ref 5–15)
BUN: 12 mg/dL (ref 8–23)
CO2: 29 mmol/L (ref 22–32)
Calcium: 9.4 mg/dL (ref 8.9–10.3)
Chloride: 84 mmol/L — ABNORMAL LOW (ref 98–111)
Creatinine, Ser: 0.73 mg/dL (ref 0.61–1.24)
GFR calc Af Amer: >60 mL/min (ref >60)
GFR calc non Af Amer: >60 mL/min (ref >60)
Glucose, Bld: 107 mg/dL — ABNORMAL HIGH (ref 70–99)
Potassium: 3.5 mmol/L (ref 3.5–5.1)
Sodium: 126 mmol/L — ABNORMAL LOW (ref 135–145)

## 2019-05-19 LAB — CBC WITH DIFFERENTIAL/PLATELET
Abs Immature Granulocytes: 0.02 10*3/uL (ref 0.00–0.07)
Basophils Absolute: 0 10*3/uL (ref 0.0–0.1)
Basophils Relative: 1 %
Eosinophils Absolute: 0.1 10*3/uL (ref 0.0–0.5)
Eosinophils Relative: 2 %
HCT: 36 % — ABNORMAL LOW (ref 39.0–52.0)
Hemoglobin: 12.6 g/dL — ABNORMAL LOW (ref 13.0–17.0)
Immature Granulocytes: 0 %
Lymphocytes Relative: 12 %
Lymphs Abs: 0.6 10*3/uL — ABNORMAL LOW (ref 0.7–4.0)
MCH: 30.1 pg (ref 26.0–34.0)
MCHC: 35 g/dL (ref 30.0–36.0)
MCV: 86.1 fL (ref 80.0–100.0)
Monocytes Absolute: 1 10*3/uL (ref 0.1–1.0)
Monocytes Relative: 21 %
Neutro Abs: 3.1 10*3/uL (ref 1.7–7.7)
Neutrophils Relative %: 64 %
Platelets: 219 10*3/uL (ref 150–400)
RBC: 4.18 MIL/uL — ABNORMAL LOW (ref 4.22–5.81)
RDW: 15.9 % — ABNORMAL HIGH (ref 11.5–15.5)
WBC: 4.7 10*3/uL (ref 4.0–10.5)
nRBC: 0 % (ref 0.0–0.2)

## 2019-05-19 LAB — SODIUM
Sodium: 122 mmol/L — ABNORMAL LOW (ref 135–145)
Sodium: 125 mmol/L — ABNORMAL LOW (ref 135–145)
Sodium: 127 mmol/L — ABNORMAL LOW (ref 135–145)
Sodium: 126 mmol/L — ABNORMAL LOW (ref 135–145)

## 2019-05-19 MED ORDER — FUROSEMIDE 40 MG PO TABS: 40.0000 mg | ORAL_TABLET | Freq: Two times a day (BID) | ORAL | Status: DC

## 2019-05-19 MED ORDER — TOLVAPTAN 15 MG PO TABS: 15.0000 mg | ORAL_TABLET | Freq: Once | ORAL | Status: DC

## 2019-05-19 MED ORDER — GUAIFENESIN ER 600 MG PO TB12
600.0000 mg | ORAL_TABLET | Freq: Two times a day (BID) | ORAL | Status: DC | PRN
  Administered 2019-05-19: 600 mg via ORAL
  Filled 2019-05-19: qty 1

## 2019-05-19 MED ORDER — FUROSEMIDE 10 MG/ML IJ SOLN
40.0000 mg | Freq: Once | INTRAMUSCULAR | Status: AC
  Administered 2019-05-19: 40 mg via INTRAVENOUS
  Filled 2019-05-19: qty 4

## 2019-05-19 NOTE — Progress Notes (Addendum)
Advanced Heart Failure Rounding Note  PCP-Cardiologist: Kurt Dawley, MD   Subjective:    [name] is a 78 y.o. 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   Diuretics initially held but received 1 dose of IV Lasix 40 mg yesterday +  tolvaptan 15 mg x 1. Na up to 126. Wt down 4 lb, 172>>168. SCr stable. 0.73.   cMRi c/w constrictive pericarditis   cMRi 05/18/19  IMPRESSION: Thickened pericardium with extensive pericardial LGE noted. Ventricular interdependence noted by free breathing sequences. Normal LV and RV EF. These findings are highly suggestive of constrictive pericarditis.   Feels ok. Very hard of hearing. Denies dyspnea. No confusion. Wants to go home.   Objective:   Weight Range: 76.6 kg Body mass index is 26.45 kg/m.   Vital Signs:   Temp:  [97.2 F (36.2 C)-98.2 F (36.8 C)] 97.7 F (36.5 C) (12/31 0719) Pulse Rate:  [79-89] 79 (12/31 0719) Resp:  [17-20] 20 (12/30 2302) BP: (116-136)/(71-89) 116/71 (12/31 0719) SpO2:  [97 %-99 %] 99 % (12/31 0719) Weight:  [76.6 kg] 76.6 kg (12/31 0719) Last BM Date: 05/16/19  Weight change: Filed Weights   05/17/19 1955 05/19/19 0719  Weight: 78.1 kg 76.6 kg    Intake/Output:   Intake/Output Summary (Last 24 hours) at 05/19/2019 1217 Last data filed at 05/19/2019 0930 Gross per 24 hour  Intake 120 ml  Output 3905 ml  Net -3785 ml      Physical Exam    General:  Elderly WM, Hard of hearing. No resp difficulty HEENT: Normal Neck: Supple. JVP . Carotids 2+ bilat; no bruits. No lymphadenopathy or thyromegaly appreciated. Cor: PMI nondisplaced. Regular rate & rhythm. No rubs, gallops or murmurs. Lungs: Clear Abdomen: mildly distended, nontender. + hepatosplenomegaly. No bruits or masses. Good bowel sounds. Extremities: No cyanosis,  clubbing, rash, tense bilateral LE dema Neuro: Alert & orientedx3, cranial nerves grossly intact. moves all 4 extremities w/o difficulty. Affect pleasant   Labs    CBC Recent Labs    05/17/19 1509 05/19/19 0735  WBC 6.2 4.7  NEUTROABS  --  3.1  HGB 12.1* 12.6*  HCT 34.4* 36.0*  MCV 86.9 86.1  PLT 218 161   Basic Metabolic Panel Recent Labs    05/17/19 2053 05/18/19 1811 05/19/19 0237 05/19/19 0735  NA  --  119* 125* 126*  K  --  3.8  --  3.5  CL  --  79*  --  84*  CO2  --  29  --  29  GLUCOSE  --  119*  --  107*  BUN  --  12  --  12  CREATININE  --  0.76  --  0.73  CALCIUM  --  9.1  --  9.4  MG 1.8  --   --   --   PHOS 3.9  --   --   --    Liver Function Tests Recent Labs    05/17/19 1509  AST 34  ALT 23  ALKPHOS 184*  BILITOT 2.6*  PROT 7.8  ALBUMIN 3.3*   No results for input(s): LIPASE, AMYLASE in the last 72 hours. Cardiac Enzymes No results for input(s): CKTOTAL, CKMB, CKMBINDEX, TROPONINI in the last 72 hours.  BNP: BNP (last 3 results) Recent Labs    08/25/18 1316 03/09/19  0034 05/17/19 1509  BNP 586.3* 303.9* 152.2*    ProBNP (last 3 results) Recent Labs    04/29/19 1450  PROBNP 1,007*     D-Dimer No results for input(s): DDIMER in the last 72 hours. Hemoglobin A1C No results for input(s): HGBA1C in the last 72 hours. Fasting Lipid Panel No results for input(s): CHOL, HDL, LDLCALC, TRIG, CHOLHDL, LDLDIRECT in the last 72 hours. Thyroid Function Tests No results for input(s): TSH, T4TOTAL, T3FREE, THYROIDAB in the last 72 hours.  Invalid input(s): FREET3  Other results:   Imaging    MR CARDIAC MORPHOLOGY W WO CONTRAST  Result Date: 05/18/2019 CLINICAL DATA:  Constrictive pericarditis EXAM: CARDIAC MRI TECHNIQUE: The patient was scanned on a 1.5 Tesla GE magnet. A dedicated cardiac coil was used. Functional imaging was done using Fiesta sequences. 2,3, and 4 chamber views were done to assess for RWMA's. Modified Simpson's  rule using a short axis stack was used to calculate an ejection fraction on a dedicated work Conservation officer, nature. The patient received 8 cc of Gadavist. After 10 minutes inversion recovery sequences were used to assess for infiltration and scar tissue. CONTRAST:  Gadavist 8 cc FINDINGS: Moderate left and small right pleural effusions with associated compressive atelectasis. Normal left ventricular size with normal wall thickness. Normal wall motion with EF 64%. Normal right ventricular size and systolic function, EF 24%. Moderately dilated left atrium, moderately dilated right atrium. Trileaflet aortic valve with calcification. No significant stenosis or regurgitation noted. Mild mitral regurgitation. T2 images were difficult (not interpretable). Free breathing images showed clear evidence for ventricular interdependence (septal shift towards the left ventricle with inspiration). Thickened pericardium reaching up to 10 mm along the RV free wall, 6 mm along the LV lateral wall. On delayed enhancement imaging, diffuse pericardial late gadolinium enhancement (LGE) was noted. There was also an area of 26-49% wall thickness subendocardial LGE in the basal inferolateral wall. Measurements: LVEDV 111 mL LVSV 72 mL LVEF 64% RVEDV 123 mL RVSV 57 mL RVEF 46% IMPRESSION: Thickened pericardium with extensive pericardial LGE noted. Ventricular interdependence noted by free breathing sequences. Normal LV and RV EF. These findings are highly suggestive of constrictive pericarditis. Tilia Faso Electronically Signed   By: Loralie Champagne M.D.   On: 05/18/2019 20:30     Medications:     Scheduled Medications: . enoxaparin (LOVENOX) injection  40 mg Subcutaneous Q24H  . folic acid  1 mg Oral Daily  . linaclotide  145 mcg Oral QAC breakfast  . multivitamin with minerals  1 tablet Oral Daily  . thiamine  100 mg Oral Daily   Or  . thiamine  100 mg Intravenous Daily    Infusions:   PRN  Medications: acetaminophen **OR** acetaminophen, guaiFENesin, LORazepam **OR** LORazepam, ondansetron **OR** ondansetron (ZOFRAN) IV    Patient Profile   [name] is a 78 y.o. 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   Assessment/Plan   1. Hyponatremia: Suspect hypervolemic hyponatremia worsened by cirrhosis and metolazone use at home.  Na 119 on admit.  He is not obviously confused.  No h/o seizure activity.   - improved w/ tolvaptan 15 mg po x 1. Na 126 today. May benefit from additional dose to tolvaptan. - Continue 1.2 L fluid restriction. - Follow Na closely.  2. Acute on chronic diastolic CHF: Due to constrictive pericarditis.  Cardiac MRI is highly suggestive of constrictive pericarditis, there is pericardial calcification on CT from 9/20. He has a pericardial knock.  On exam, he is volume overloaded though suspect he has been worse in the recent past.  He was taking metolazone, Lasix and spironolactone at home.  Diuretics were held at admission with hyponatremia.  - He is getting tolvaptan as above.  - Recieved Lasix 40 mg IV x 1 yesterday, for gentle diuresis. Wt down 4 lb. - At this point, suspect pericardial constriction is chronic and there is unlikely to be a role for colchicine or NSAIDs.  - TCTS to see to evaluate for pericardial stripping. Probably not surgical candidate with what appears to be advanced cirrhosis.  3. Cirrhosis: Cirrhotic liver with h/o ascites and multiple paracenteses.  Suspect h/o ETOH plays a role, but suspect RV failure in setting of constrictive pericarditis also plays a role.    4. CAD: 10/20 cath showed 99% OM3 stenosis treated medically.  This fits the small area of inferolateral subendocardial LGE seen on cMRI.  5. Atrial fibrillation: Permanent, rate is controlled. He has not been anticoagulated due to GI  bleeding and cirrhosis.   Length of Stay: 1  Lyda Jester, PA-C  05/19/2019, 12:17 PM  Advanced Heart Failure Team Pager [phone] (M-F; 7a - 4p)  Please contact [facility] for night-coverage after hours (4p -7a ) and weekends on [email or web address]  Patient seen with PA, agree with the above note.   Sodium up from 119 to 126 today with Samsca and a dose of IV Lasix.  No complaints today, wants to go home.   General: NAD Neck: JVP 10-12 cm, no thyromegaly or thyroid nodule.  Lungs: Clear to auscultation bilaterally with normal respiratory effort. CV: Nondisplaced PMI.  Heart regular S1/S2, pericardial knock, no murmur.  No peripheral edema.   Abdomen: Soft, nontender, no hepatosplenomegaly, no distention.  Skin: Intact without lesions or rashes.  Neurologic: Alert and oriented x 3.  Psych: Normal affect. Extremities: No clubbing or cyanosis.  HEENT: Normal.   Still mild volume overload but improved compared to yesterday.  Sodium up to 126 with appropriate rate of rise.  - With Na 126, would continue fluid restriction and can hold off on additional Samsca.  - Will give 1 more dose of Lasix 40 mg IV x 1 today with mild ongoing volume overload and stable creatinine.  - At home, he was taking Lasix 80 mg bid + spironolactone 50 mg bid and was still requiring doses of metolazone.  Ideally, would have pericardial stripping but may not be a surgical candidate. Will try to send home on Lasix 80 mg bid + spironolactone 50 mg bid (ascites with paracenteses) without using metolazone given profound hyponatremia at admission. Will need to stay fluid restricted at home.   Classic findings of pericardial constriction on exam and cardiac MRI.  He has marked signs of RV failure which is typical of symptomatic pericardial constriction. Ideally, would have pericardial stripping.  However, with cirrhosis, he is likely not a candidate for operative management.  TCTS to see.   Loralie Champagne 05/19/2019 4:19 PM

## 2019-05-19 NOTE — Consult Note (Addendum)
[address]       [place] [address]             [phone]        [facility] Medical Record #[id number] Date of Birth: 1940/12/11  Referring: No ref. provider found Primary Care: Shon Baton, MD Primary Cardiologist:Katarina Meda Coffee, MD  Chief Complaint:   Constrictive pericarditis  History of Present Illness:    Patient is a 78 year old male with a recent diagnosis of constrictive pericarditis.  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  And was recently undergoing paracentesis every 2 weeks but hasn't needed in a couple months.  He needed bilat thoracentesis on one occasion  He additionally has a history of hypertension, atrial flutter status post remote ablation in 2011, coronary artery disease(99%OM3) treated medically.  A CT scan of the chest in September 2020 showed extensive pericardial thickening and calcification with morphologic changes indicative of constrictive physiology.  Additional findings on his scan showed deviated liver to have a shrunken appearance with nodular contour indicative of underlying cirrhosis as well as evidence of portal venous hypertension and moderate volumes of ascites.  He has been seen by the advanced heart failure team and been treated with bilateral thoracentesis as well as increasing doses of loop diuretics.  Echocardiogram shows preserved LV function and constrictive physiology.  An MR cardiac morphology study was done yesterday and this confirms findings of the constrictive pericarditis.  We are asked to see the patient for consideration of any surgical intervention related to the pericarditis.    Current Activity/ Functional Status: Patient is independent with mobility/ambulation, transfers, ADL's, IADL's.   Zubrod Score: At the time of surgery this patient's most appropriate activity status/level should be described as: []     0    Normal activity,  no symptoms [x]     1    Restricted in physical strenuous activity but ambulatory, able to do out light work []     2    Ambulatory and capable of self care, unable to do work activities, up and about                 more than 50%  Of the time                            []     3    Only limited self care, in bed greater than 50% of waking hours []     4    Completely disabled, no self care, confined to bed or chair []     5    Moribund  Past Medical History:  Diagnosis Date  . Acute respiratory failure with hypoxia ([place]) 03/09/2019  . Adenomatous polyps 06/2004  . Agent orange exposure   . Alcoholic cirrhosis of liver with ascites ([place])   . Alcoholism ([place])   . Allergy   . Anxiety    PTSD- no meds  . Atrial fibrillation ([place])   . Atrial flutter ([place])    had ablation   . CAD in native artery    cath 04/2019: 99% stenosis of ostium of OM>> small vessel>> RX tx  . Chronic constrictive pericarditis    a. cath 04/2019 - moderate elevated L heart pressure; severely edevated R heart adn pulomary artery pressures   . Chronic kidney disease    "beginnings of kidney failure"- 3-4 years ago  .  Depression   . Diabetes mellitus without complication (HCC)    no meds  . Diverticulosis   . GERD (gastroesophageal reflux disease)   . Hemorrhoids   . Hiatal hernia   . Hx of hernia repair   . Hyperlipidemia   . Hypertension   . Hyponatremia 08/2018  . Iron deficiency anemia   . Lung nodules    bilateral  . Pedal edema   . Pneumonia    06-2016  . PTSD (post-traumatic stress disorder)   . Substance abuse ([place])    alcohol use  . Ulcer   . Varicose veins     Past Surgical History:  Procedure Laterality Date  . ATRIAL ABLATION SURGERY    . COLONOSCOPY    . FUDUCIAL PLACEMENT N/A 07/26/2018   Procedure: PLACEMENT OF FUDUCIAL;  Surgeon: [name], MD;  Location: [facility];  Service: Thoracic;  Laterality: N/A;  . IR PARACENTESIS  10/28/2016  . IR PARACENTESIS  11/12/2016  . IR  PARACENTESIS  12/02/2016  . IR PARACENTESIS  12/10/2016  . IR PARACENTESIS  12/30/2016  . IR PARACENTESIS  01/27/2017  . IR PARACENTESIS  09/09/2017  . IR PARACENTESIS  03/04/2018  . IR PARACENTESIS  04/08/2018  . IR PARACENTESIS  05/10/2018  . IR PARACENTESIS  05/21/2018  . IR PARACENTESIS  06/16/2018  . IR PARACENTESIS  07/23/2018  . IR PARACENTESIS  10/06/2018  . IR PARACENTESIS  10/28/2018  . IR PARACENTESIS  01/12/2019  . IR PARACENTESIS  01/28/2019  . IR PARACENTESIS  02/04/2019  . IR PARACENTESIS  02/15/2019  . IR PARACENTESIS  02/28/2019  . IR PARACENTESIS  03/22/2019  . IR PARACENTESIS  04/04/2019  . IR PARACENTESIS  04/15/2019  . IR RADIOLOGIST EVAL & MGMT  06/22/2018  . IR THORACENTESIS ASP PLEURAL SPACE W/IMG GUIDE  03/09/2019  . IR THORACENTESIS ASP PLEURAL SPACE W/IMG GUIDE  03/10/2019  . POLYPECTOMY    . RIGHT/LEFT HEART CATH AND CORONARY ANGIOGRAPHY N/A 04/21/2019   Procedure: RIGHT/LEFT HEART CATH AND CORONARY ANGIOGRAPHY;  Surgeon: Nelva Bush, MD;  Location: [facility];  Service: Cardiovascular;  Laterality: N/A;  . THORACENTESIS  03/09/2019  . TONSILLECTOMY    . UMBILICAL HERNIA REPAIR    . VARICOSE VEIN SURGERY     x4  . VIDEO BRONCHOSCOPY WITH ENDOBRONCHIAL NAVIGATION N/A 07/26/2018   Procedure: VIDEO BRONCHOSCOPY WITH ENDOBRONCHIAL NAVIGATION;  Surgeon: [name], MD;  [facility];  Service: Thoracic;  Laterality: N/A;    Social History   Tobacco Use  Smoking Status Never Smoker  Smokeless Tobacco Never Used    Social History   Substance and Sexual Activity  Alcohol Use Yes   Comment: occasaional beer     Allergies  Allergen Reactions  . Lisinopril Anaphylaxis and Other (See Comments)    Hyperkalemia, Dizziness   . Lipitor [Atorvastatin] Other (See Comments)    Marked leg fatigue  . Sulfonamide Derivatives Other (See Comments)    UNSPECIFIED REACTION      Current Facility-Administered Medications  Medication Dose Route  Frequency Provider Last Rate Last Admin  . acetaminophen (TYLENOL) tablet 650 mg  650 mg Oral Q6H PRN Etta Quill, DO       Or  . acetaminophen (TYLENOL) suppository 650 mg  650 mg Rectal Q6H PRN Etta Quill, DO      . enoxaparin (LOVENOX) injection 40 mg  40 mg Subcutaneous Q24H Jennette Kettle M, DO   40 mg at 05/18/19 1037  .  folic acid (FOLVITE) tablet 1 mg  1 mg Oral Daily Jennette Kettle M, DO   1 mg at 05/18/19 1037  . guaiFENesin (MUCINEX) 12 hr tablet 600 mg  600 mg Oral BID PRN Geradine Girt, DO      . linaclotide (LINZESS) capsule 145 mcg  145 mcg Oral QAC breakfast Eulogio Bear U, DO   145 mcg at 05/19/19 0554  . LORazepam (ATIVAN) tablet 1-4 mg  1-4 mg Oral Q1H PRN Etta Quill, DO       Or  . LORazepam (ATIVAN) injection 1-4 mg  1-4 mg Intravenous Q1H PRN Etta Quill, DO      . multivitamin with minerals tablet 1 tablet  1 tablet Oral Daily Jennette Kettle M, DO   1 tablet at 05/18/19 1037  . ondansetron (ZOFRAN) tablet 4 mg  4 mg Oral Q6H PRN Etta Quill, DO       Or  . ondansetron [facility]) injection 4 mg  4 mg Intravenous Q6H PRN Etta Quill, DO      . thiamine tablet 100 mg  100 mg Oral Daily Alcario Drought, Jared M, DO   100 mg at 05/18/19 1037   Or  . thiamine (B-1) injection 100 mg  100 mg Intravenous Daily Jennette Kettle M, DO        Medications Prior to Admission  Medication Sig Dispense Refill Last Dose  . folic acid (FOLVITE) 1 MG tablet Take 1 tablet (1 mg total) by mouth daily. 30 tablet 1 05/17/2019 at Unknown time  . furosemide (LASIX) 80 MG tablet Take 1 tablet (80 mg total) by mouth 2 (two) times daily. (Patient taking differently: Take 40 mg by mouth 2 (two) times daily. ) 180 tablet 3 05/17/2019 at Unknown time  . Guaifenesin (MUCINEX MAXIMUM STRENGTH) 1200 MG TB12 Take 1,200 mg by mouth daily as needed (congestion).   UNK  . linaclotide (LINZESS) 145 MCG CAPS capsule Take 145 mcg by mouth daily as needed (constipation).   Past Week at  Unknown time  . LORazepam (ATIVAN) 0.5 MG tablet Take 0.5 mg by mouth at bedtime as needed for sleep.    Past Week at Unknown time  . metolazone (ZAROXOLYN) 2.5 MG tablet Take 1 tablet (2.5 mg total) by mouth 2 (two) times a week. Every Monday and Friday 10 tablet 6 05/16/2019  . Multiple Vitamin (MULTIVITAMIN WITH MINERALS) TABS tablet Take 1 tablet by mouth daily.   05/17/2019 at Unknown time  . omeprazole (PRILOSEC) 20 MG capsule Take 1 capsule (20 mg total) by mouth 2 (two) times daily. 60 capsule 5 05/17/2019 at Unknown time  . potassium chloride (KLOR-CON) 10 MEQ tablet Take 2 tablets (20 mEq total) by mouth daily. Take extra 4 tabs on Mon and Friday (Patient taking differently: Take 10 mEq by mouth 2 (two) times daily. Take extra 4 tabs on Mon and Friday) 100 tablet 5 05/17/2019 at Unknown time  . Skin Protectants, Misc. (EUCERIN) cream Apply 1 application topically as needed (irritated skin).   Past Week at Unknown time  . sodium chloride (OCEAN) 0.65 % SOLN nasal spray Place 1 spray into both nostrils as needed for congestion.   UNK  . spironolactone (ALDACTONE) 50 MG tablet Take 25 mg by mouth 2 (two) times daily.    05/17/2019 at Unknown time  . thiamine 100 MG tablet Take 1 tablet (100 mg total) by mouth daily. 30 tablet 1 05/17/2019 at Unknown time  . escitalopram (LEXAPRO) 10 MG  tablet Take 10 mg by mouth daily.       Family History  Problem Relation Age of Onset  . Colon polyps Brother   . Prostate cancer Brother   . Heart disease Father        rheumatic fever as child  . Stroke Mother   . Colon cancer Neg Hx   . Esophageal cancer Neg Hx   . Rectal cancer Neg Hx   . Stomach cancer Neg Hx      Review of Systems:   Review of Systems  Constitutional: Positive for malaise/fatigue and weight loss. Negative for chills, diaphoresis and fever.       Lost 15 lbs in a month  HENT: Positive for congestion, hearing loss, nosebleeds and tinnitus. Negative for ear discharge, ear  pain, sinus pain and sore throat.   Eyes: Negative for blurred vision, double vision, photophobia, pain, discharge and redness.  Respiratory: Negative for cough, hemoptysis, sputum production, shortness of breath, wheezing and stridor.   Cardiovascular: Positive for leg swelling. Negative for chest pain, palpitations, orthopnea, claudication and PND.  Gastrointestinal: Positive for constipation and heartburn. Negative for abdominal pain, blood in stool, diarrhea, melena, nausea and vomiting.  Genitourinary: Positive for frequency and urgency. Negative for dysuria, flank pain and hematuria.  Musculoskeletal: Positive for falls. Negative for back pain, joint pain, myalgias and neck pain.  Skin: Negative for itching and rash.  Neurological: Positive for tingling, sensory change and weakness. Negative for dizziness, tremors, speech change, focal weakness, seizures, loss of consciousness and headaches.  Endo/Heme/Allergies: Negative for environmental allergies and polydipsia. Bruises/bleeds easily.  Psychiatric/Behavioral: Positive for depression, memory loss and substance abuse. Negative for hallucinations and suicidal ideas. The patient is nervous/anxious and has insomnia.        PTSD      Physical Exam: BP 116/71 (BP Location: Right Arm)   Pulse 79   Temp 97.7 F (36.5 C) (Oral)   Resp 20   Ht 5\' 7"  (1.702 m)   Wt 78.1 kg   SpO2 99%   BMI 26.97 kg/m   Physical Exam  Constitutional: He appears chronically ill.  HENT:  Mouth/Throat: Oropharynx is clear. Pharynx is normal.  Full dentures  Eyes: Pupils are equal, round, and reactive to light. Conjunctivae are normal.  Neck: Thyroid normal. No JVD present. No neck adenopathy. No thyromegaly present.  Cardiovascular: Normal rate, regular rhythm, S1 normal, S2 normal and intact distal pulses. PMI is not displaced. Exam reveals no gallop.  No murmur heard. Pulmonary/Chest: Effort normal and breath sounds normal. He has no wheezes. He has no  rales. He exhibits no tenderness.  Abdominal: Soft. Bowel sounds are normal. He exhibits no mass. There is no hepatomegaly. There is no abdominal tenderness.  + ascites, mild distension  Musculoskeletal:        General: No tenderness, deformity or edema. Normal range of motion.     Cervical back: Normal range of motion and neck supple.  Neurological: He is alert and oriented to person, place, and time.  Hard of hearing  Skin: Skin is warm and dry. No rash noted. No cyanosis. No jaundice or pallor. Nails show no clubbing.    Diagnostic Studies & Laboratory data:     Recent Radiology Findings:   DG Chest Port 1 View  Result Date: 05/17/2019 CLINICAL DATA:  Low potassium levels, increased shortness of breath EXAM: PORTABLE CHEST 1 VIEW COMPARISON:  03/10/2019 FINDINGS: Small bilateral pleural effusions, left greater than right. Bilateral diffuse interstitial thickening.  No pneumothorax. Stable cardiomegaly. No acute osseous abnormality. IMPRESSION: 1. Findings most consistent with CHF. Electronically Signed   By: Kathreen Devoid   On: 05/17/2019 20:03   MR CARDIAC MORPHOLOGY W WO CONTRAST  Result Date: 05/18/2019 CLINICAL DATA:  Constrictive pericarditis EXAM: CARDIAC MRI TECHNIQUE: The patient was scanned on a 1.5 Tesla GE magnet. A dedicated cardiac coil was used. Functional imaging was done using Fiesta sequences. 2,3, and 4 chamber views were done to assess for RWMA's. Modified Simpson's rule using a short axis stack was used to calculate an ejection fraction on a dedicated work Conservation officer, nature. The patient received 8 cc of Gadavist. After 10 minutes inversion recovery sequences were used to assess for infiltration and scar tissue. CONTRAST:  Gadavist 8 cc FINDINGS: Moderate left and small right pleural effusions with associated compressive atelectasis. Normal left ventricular size with normal wall thickness. Normal wall motion with EF 64%. Normal right ventricular size and systolic  function, EF 93%. Moderately dilated left atrium, moderately dilated right atrium. Trileaflet aortic valve with calcification. No significant stenosis or regurgitation noted. Mild mitral regurgitation. T2 images were difficult (not interpretable). Free breathing images showed clear evidence for ventricular interdependence (septal shift towards the left ventricle with inspiration). Thickened pericardium reaching up to 10 mm along the RV free wall, 6 mm along the LV lateral wall. On delayed enhancement imaging, diffuse pericardial late gadolinium enhancement (LGE) was noted. There was also an area of 26-49% wall thickness subendocardial LGE in the basal inferolateral wall. Measurements: LVEDV 111 mL LVSV 72 mL LVEF 64% RVEDV 123 mL RVSV 57 mL RVEF 46% IMPRESSION: Thickened pericardium with extensive pericardial LGE noted. Ventricular interdependence noted by free breathing sequences. Normal LV and RV EF. These findings are highly suggestive of constrictive pericarditis. Dalton Mclean Electronically Signed   By: Loralie Champagne M.D.   On: 05/18/2019 20:30     I have independently reviewed the above radiologic studies and discussed with the patient   Recent Lab Findings: Lab Results  Component Value Date   WBC 6.2 05/17/2019   HGB 12.1 (L) 05/17/2019   HCT 34.4 (L) 05/17/2019   PLT 218 05/17/2019   GLUCOSE 119 (H) 05/18/2019   CHOL 143 06/25/2006   TRIG 67 06/25/2006   HDL 32.4 (L) 06/25/2006   LDLCALC 97 06/25/2006   ALT 23 05/17/2019   AST 34 05/17/2019   NA 125 (L) 05/19/2019   K 3.8 05/18/2019   CL 79 (L) 05/18/2019   CREATININE 0.76 05/18/2019   BUN 12 05/18/2019   CO2 29 05/18/2019   TSH 1.317 08/28/2018   INR 1.3 (H) 03/09/2019   HGBA1C 5.7 (H) 07/06/2018      Assessment / Plan: Constrictive pericarditis but appears to be pretty well medically compensated on current regimen. His sodium has shown improving trends with medical management.  He has not required paracentesis or  thoracentesis in the past couple months.  With cirrhosis and current MELD score approximately 23 (no recent INR)   he would be at high risk for any surgical intervention.  Recommend maximizing ongoing medical management as you are doing.        I  spent 60 minutes counseling the patient face to face.   John Giovanni, PA-C 05/19/2019 8:11 AM   I have seen and examined the patient and agree with the assessment as outlined above by Jadene Pierini, PA-C.  I would not consider this patient a candidate for pericardiectomy for constrictive pericarditis due to  his coexistent advanced liver failure.  Rexene Alberts, MD 05/19/2019 4:58 PM

## 2019-05-19 NOTE — Progress Notes (Signed)
[facility] PROGRESS NOTE  [name] FGH:[id number] DOB: 04/20/1941 DOA: 05/17/2019 PCP: Shon Baton, MD  Assessment/Plan:  #1. Hyponatremia. 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 Sodium level up to 126 this am s/p Samsca one dose and IV lasix. Patient alert and oriented but very [place]. Nephrology recommends resuming diuretics and further observation -lasix starting tomorrow -daily bment -fluid restiction -appreciate nephrology assistance   #2. Constrictive pericarditis. Per cMRI. 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 Evaluated by TCTS who recommend continuation of medical management  #3. Acute on chronic diastolic heart failure. Likely related to #2. Echo 10/20 with EF 60%, mild LVH. Cardiac MRI as noted above. Evaluated by cardiology. See #1. Weight down 4Lbs -lasix tomorrow as noted -daily weight -monitor intake and output -appreciate cards assistance  #4. Cirrhosis. Related to etoh in setting of heart failure and constrictive pericarditis. Continues to drink. Reports "2 [place] daily". Hx ascites with multiple paracentesis  #5. Atrial fibrillation. Rate controlled. No anticoagulation due to hx GI bleed and cirrhosis.  #6. ETOH. No s/sx withdrawal -CIWA  Code Status: full Family Communication: patient  Disposition Plan: home when ready   Consultants:  Hollie Salk nephrology  [facility] cardiothoracic  mclean cards  Procedures:    Antibiotics:    HPI/Subjective: Sitting in chair. Wants to go home. Denies pain   Objective: Vitals:   05/18/19 2302 05/19/19 0719  BP: 134/89 116/71  Pulse: 89 79  Resp: 20   Temp: 98.2 F (36.8 C) 97.7 F (36.5 C)  SpO2: 99% 99%    Intake/Output Summary (Last 24 hours) at 05/19/2019 1505 Last data filed at 05/19/2019 1445 Gross per 24  hour  Intake 120 ml  Output 4330 ml  Net -4210 ml   Filed Weights   05/17/19 1955 05/19/19 0719  Weight: 78.1 kg 76.6 kg    Exam:   General:  Awake alert very HOH and very talkative  Cardiovascular: rrr no mgr no LE edema  Respiratory: normal effort BS clear bilaterally no wheeze  Abdomen: non distended soft +BS no guarding or rebounding  Musculoskeletal: joints without swelling/erythema   Data Reviewed: Basic Metabolic Panel: Recent Labs  Lab 05/17/19 1509 05/17/19 2053 05/18/19 0426 05/18/19 1811 05/18/19 2324 05/19/19 0237 05/19/19 0735 05/19/19 1344  NA 121*  --  119* 119* 122* 125* 126* 127*  K 4.6  --  3.7 3.8  --   --  3.5  --   CL 82*  --  80* 79*  --   --  84*  --   CO2 29  --  26 29  --   --  29  --   GLUCOSE 124*  --  152* 119*  --   --  107*  --   BUN 14  --  12 12  --   --  12  --   CREATININE 0.73  --  0.70 0.76  --   --  0.73  --   CALCIUM 9.4  --  8.9 9.1  --   --  9.4  --   MG  --  1.8  --   --   --   --   --   --   PHOS  --  3.9  --   --   --   --   --   --    Liver  Function Tests: Recent Labs  Lab 05/17/19 1509  AST 34  ALT 23  ALKPHOS 184*  BILITOT 2.6*  PROT 7.8  ALBUMIN 3.3*   No results for input(s): LIPASE, AMYLASE in the last 168 hours. No results for input(s): AMMONIA in the last 168 hours. CBC: Recent Labs  Lab 05/17/19 1509 05/19/19 0735  WBC 6.2 4.7  NEUTROABS  --  3.1  HGB 12.1* 12.6*  HCT 34.4* 36.0*  MCV 86.9 86.1  PLT 218 219   Cardiac Enzymes: No results for input(s): CKTOTAL, CKMB, CKMBINDEX, TROPONINI in the last 168 hours. BNP (last 3 results) Recent Labs    08/25/18 1316 03/09/19 0034 05/17/19 1509  BNP 586.3* 303.9* 152.2*    ProBNP (last 3 results) Recent Labs    04/29/19 1450  PROBNP 1,007*    CBG: No results for input(s): GLUCAP in the last 168 hours.  Recent Results (from the past 240 hour(s))  SARS CORONAVIRUS 2 (TAT 6-24 HRS) Nasopharyngeal Nasopharyngeal Swab     Status: None    Collection Time: 05/17/19  8:41 PM   Specimen: Nasopharyngeal Swab  Result Value Ref Range Status   SARS Coronavirus 2 NEGATIVE NEGATIVE Final    Comment: (NOTE) SARS-CoV-2 target nucleic acids are NOT DETECTED. The SARS-CoV-2 RNA is generally detectable in upper and lower respiratory specimens during the acute phase of infection. Negative results do not preclude SARS-CoV-2 infection, do not rule out co-infections with other pathogens, and should not be used as the sole basis for treatment or other patient management decisions. Negative results must be combined with clinical observations, patient history, and epidemiological information. The expected result is Negative. Fact Sheet for Patients: SugarRoll.be Fact Sheet for Healthcare Providers: https://www.woods-mathews.com/ This test is not yet approved or cleared by the [place] FDA and  has been authorized for detection and/or diagnosis of SARS-CoV-2 by FDA under an Emergency Use Authorization (EUA). This EUA will remain  in effect (meaning this test can be used) for the duration of the COVID-19 declaration under Section 56 4(b)(1) of the Act, 21 U.S.C. section 360bbb-3(b)(1), unless the authorization is terminated or revoked sooner. Performed at [facility], [place] [address] [place], [place] [address]      Studies: DG Chest Port 1 View  Result Date: 05/17/2019 CLINICAL DATA:  Low potassium levels, increased shortness of breath EXAM: PORTABLE CHEST 1 VIEW COMPARISON:  03/10/2019 FINDINGS: Small bilateral pleural effusions, left greater than right. Bilateral diffuse interstitial thickening. No pneumothorax. Stable cardiomegaly. No acute osseous abnormality. IMPRESSION: 1. Findings most consistent with CHF. Electronically Signed   By: Kathreen Devoid   On: 05/17/2019 20:03   MR CARDIAC MORPHOLOGY W WO CONTRAST  Result Date: 05/18/2019 CLINICAL DATA:  Constrictive pericarditis  EXAM: CARDIAC MRI TECHNIQUE: The patient was scanned on a 1.5 Tesla GE magnet. A dedicated cardiac coil was used. Functional imaging was done using Fiesta sequences. 2,3, and 4 chamber views were done to assess for RWMA's. Modified Simpson's rule using a short axis stack was used to calculate an ejection fraction on a dedicated work Conservation officer, nature. The patient received 8 cc of Gadavist. After 10 minutes inversion recovery sequences were used to assess for infiltration and scar tissue. CONTRAST:  Gadavist 8 cc FINDINGS: Moderate left and small right pleural effusions with associated compressive atelectasis. Normal left ventricular size with normal wall thickness. Normal wall motion with EF 64%. Normal right ventricular size and systolic function, EF 77%. Moderately dilated left atrium, moderately dilated right  atrium. Trileaflet aortic valve with calcification. No significant stenosis or regurgitation noted. Mild mitral regurgitation. T2 images were difficult (not interpretable). Free breathing images showed clear evidence for ventricular interdependence (septal shift towards the left ventricle with inspiration). Thickened pericardium reaching up to 10 mm along the RV free wall, 6 mm along the LV lateral wall. On delayed enhancement imaging, diffuse pericardial late gadolinium enhancement (LGE) was noted. There was also an area of 26-49% wall thickness subendocardial LGE in the basal inferolateral wall. Measurements: LVEDV 111 mL LVSV 72 mL LVEF 64% RVEDV 123 mL RVSV 57 mL RVEF 46% IMPRESSION: Thickened pericardium with extensive pericardial LGE noted. Ventricular interdependence noted by free breathing sequences. Normal LV and RV EF. These findings are highly suggestive of constrictive pericarditis. Dalton Mclean Electronically Signed   By: Loralie Champagne M.D.   On: 05/18/2019 20:30    Scheduled Meds: . enoxaparin (LOVENOX) injection  40 mg Subcutaneous Q24H  . folic acid  1 mg Oral Daily  .  [START ON 05/20/2019] furosemide  40 mg Oral BID  . linaclotide  145 mcg Oral QAC breakfast  . multivitamin with minerals  1 tablet Oral Daily  . thiamine  100 mg Oral Daily   Or  . thiamine  100 mg Intravenous Daily   Continuous Infusions:  Principal Problem:   Hyponatremia Active Problems:   Cirrhosis of liver with ascites (HCC)   Constrictive pericarditis   Acute on chronic diastolic heart failure (HCC)   ETOH abuse   Essential hypertension   Atrial fibrillation ([place])    Time spent: 54 minutes    [place] NP  [facility]  If 7PM-7AM, please contact night-coverage at [email or web address], password [facility] 05/19/2019, 3:05 PM  LOS: 1 day

## 2019-05-19 NOTE — Progress Notes (Signed)
[facility] Progress Note    Assessment/ Plan:   1. 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  Na up with Samsca x 1 and IV Lasix.  Na 119--> 127 in 18 hours--> will restart lasix tomorrow 1/1.  Wouldn't do another dose of samsca now prevent overcorrection.    2. Cirrhosis from ETOH  3. Constrictive pericarditis, AHF following 4. Can go home when Na stable/ improved with resumption of Lasix.    Subjective:    Na up s/p samsca and Lasix.  Standing at sink this AM washing up.   Objective:   BP 116/71 (BP Location: Right Arm)   Pulse 79   Temp 97.7 F (36.5 C) (Oral)   Resp 20   Ht 5\' 7"  (1.702 m)   Wt 76.6 kg   SpO2 99%   BMI 26.45 kg/m   Intake/Output Summary (Last 24 hours) at 05/19/2019 1450 Last data filed at 05/19/2019 1445 Gross per 24 hour  Intake 120 ml  Output 4330 ml  Net -4210 ml   Weight change:   Physical Exam: Gen: NAD, standing at sink CVS: RRR Resp: clear unlabored Abd + obese Ext: trace LE edema  Imaging: DG Chest Port 1 View  Result Date: 05/17/2019 CLINICAL DATA:  Low potassium levels, increased shortness of breath EXAM: PORTABLE CHEST 1 VIEW COMPARISON:  03/10/2019 FINDINGS: Small bilateral pleural effusions, left greater than right. Bilateral diffuse interstitial thickening. No pneumothorax. Stable cardiomegaly. No acute osseous abnormality. IMPRESSION: 1. Findings most consistent with CHF. Electronically Signed   By: Kathreen Devoid   On: 05/17/2019 20:03   MR CARDIAC MORPHOLOGY W WO CONTRAST  Result Date: 05/18/2019 CLINICAL DATA:  Constrictive pericarditis EXAM: CARDIAC MRI TECHNIQUE: The patient was scanned on a 1.5 Tesla GE magnet. A dedicated cardiac coil was used. Functional imaging was done using Fiesta sequences. 2,3, and 4 chamber views were done to assess for RWMA's. Modified Simpson's rule using a short axis stack was used to calculate an  ejection fraction on a dedicated work Conservation officer, nature. The patient received 8 cc of Gadavist. After 10 minutes inversion recovery sequences were used to assess for infiltration and scar tissue. CONTRAST:  Gadavist 8 cc FINDINGS: Moderate left and small right pleural effusions with associated compressive atelectasis. Normal left ventricular size with normal wall thickness. Normal wall motion with EF 64%. Normal right ventricular size and systolic function, EF 53%. Moderately dilated left atrium, moderately dilated right atrium. Trileaflet aortic valve with calcification. No significant stenosis or regurgitation noted. Mild mitral regurgitation. T2 images were difficult (not interpretable). Free breathing images showed clear evidence for ventricular interdependence (septal shift towards the left ventricle with inspiration). Thickened pericardium reaching up to 10 mm along the RV free wall, 6 mm along the LV lateral wall. On delayed enhancement imaging, diffuse pericardial late gadolinium enhancement (LGE) was noted. There was also an area of 26-49% wall thickness subendocardial LGE in the basal inferolateral wall. Measurements: LVEDV 111 mL LVSV 72 mL LVEF 64% RVEDV 123 mL RVSV 57 mL RVEF 46% IMPRESSION: Thickened pericardium with extensive pericardial LGE noted. Ventricular interdependence noted by free breathing sequences. Normal LV and RV EF. These findings are highly suggestive of constrictive pericarditis. Dalton Mclean Electronically Signed   By: Loralie Champagne M.D.   On: 05/18/2019 20:30    Labs: [employer] Recent Labs  Lab 05/17/19 1509 05/17/19 2053 05/18/19 0426 05/18/19 1811 05/18/19 2324 05/19/19 0237 05/19/19 0735 05/19/19  1344  NA 121*  --  119* 119* 122* 125* 126* 127*  K 4.6  --  3.7 3.8  --   --  3.5  --   CL 82*  --  80* 79*  --   --  84*  --   CO2 29  --  26 29  --   --  29  --   GLUCOSE 124*  --  152* 119*  --   --  107*  --   BUN 14  --  12 12  --   --  12  --    CREATININE 0.73  --  0.70 0.76  --   --  0.73  --   CALCIUM 9.4  --  8.9 9.1  --   --  9.4  --   PHOS  --  3.9  --   --   --   --   --   --    CBC Recent Labs  Lab 05/17/19 1509 05/19/19 0735  WBC 6.2 4.7  NEUTROABS  --  3.1  HGB 12.1* 12.6*  HCT 34.4* 36.0*  MCV 86.9 86.1  PLT 218 219    Medications:    . enoxaparin (LOVENOX) injection  40 mg Subcutaneous Q24H  . folic acid  1 mg Oral Daily  . [START ON 05/20/2019] furosemide  40 mg Oral BID  . linaclotide  145 mcg Oral QAC breakfast  . multivitamin with minerals  1 tablet Oral Daily  . thiamine  100 mg Oral Daily   Or  . thiamine  100 mg Intravenous Daily      Madelon Lips MD 05/19/2019, 2:50 PM

## 2019-05-20 DIAGNOSIS — K7031 Alcoholic cirrhosis of liver with ascites: Secondary | ICD-10-CM

## 2019-05-20 DIAGNOSIS — I311 Chronic constrictive pericarditis: Secondary | ICD-10-CM

## 2019-05-20 LAB — BASIC METABOLIC PANEL
Anion gap: 10 (ref 5–15)
BUN: 17 mg/dL (ref 8–23)
CO2: 31 mmol/L (ref 22–32)
Calcium: 9.2 mg/dL (ref 8.9–10.3)
Chloride: 85 mmol/L — ABNORMAL LOW (ref 98–111)
Creatinine, Ser: 0.78 mg/dL (ref 0.61–1.24)
GFR calc Af Amer: >60 mL/min (ref >60)
GFR calc non Af Amer: >60 mL/min (ref >60)
Glucose, Bld: 114 mg/dL — ABNORMAL HIGH (ref 70–99)
Potassium: 3.6 mmol/L (ref 3.5–5.1)
Sodium: 126 mmol/L — ABNORMAL LOW (ref 135–145)

## 2019-05-20 LAB — CBC WITH DIFFERENTIAL/PLATELET
Abs Immature Granulocytes: 0.02 10*3/uL (ref 0.00–0.07)
Basophils Absolute: 0 10*3/uL (ref 0.0–0.1)
Basophils Relative: 1 %
Eosinophils Absolute: 0.1 10*3/uL (ref 0.0–0.5)
Eosinophils Relative: 3 %
HCT: 34.2 % — ABNORMAL LOW (ref 39.0–52.0)
Hemoglobin: 12 g/dL — ABNORMAL LOW (ref 13.0–17.0)
Immature Granulocytes: 1 %
Lymphocytes Relative: 11 %
Lymphs Abs: 0.4 10*3/uL — ABNORMAL LOW (ref 0.7–4.0)
MCH: 30.2 pg (ref 26.0–34.0)
MCHC: 35.1 g/dL (ref 30.0–36.0)
MCV: 86.1 fL (ref 80.0–100.0)
Monocytes Absolute: 0.8 10*3/uL (ref 0.1–1.0)
Monocytes Relative: 21 %
Neutro Abs: 2.5 10*3/uL (ref 1.7–7.7)
Neutrophils Relative %: 63 %
Platelets: 208 10*3/uL (ref 150–400)
RBC: 3.97 MIL/uL — ABNORMAL LOW (ref 4.22–5.81)
RDW: 15.8 % — ABNORMAL HIGH (ref 11.5–15.5)
WBC: 3.9 10*3/uL — ABNORMAL LOW (ref 4.0–10.5)
nRBC: 0 % (ref 0.0–0.2)

## 2019-05-20 LAB — SODIUM: Sodium: 126 mmol/L — ABNORMAL LOW (ref 135–145)

## 2019-05-20 MED ORDER — FUROSEMIDE 80 MG PO TABS
80.0000 mg | ORAL_TABLET | Freq: Two times a day (BID) | ORAL | Status: DC
  Administered 2019-05-20: 80 mg via ORAL
  Filled 2019-05-20: qty 1

## 2019-05-20 MED ORDER — SPIRONOLACTONE 25 MG PO TABS: 50.0000 mg | ORAL_TABLET | Freq: Two times a day (BID) | ORAL | Status: DC

## 2019-05-20 NOTE — Progress Notes (Signed)
Brief progress note- Na reviewed and is stable, OK to restart Lasix 80 BID (I ordered this AM) Agree with restarting aldactone too He previously had f/u with our clinic in April which he did not keep OK with discharge Will try again  Madelon Lips MD [place] pgr [phone]

## 2019-05-20 NOTE — Discharge Summary (Signed)
Physician Discharge Summary  [name] WVP:[id number] DOB: September 03, 1940 DOA: 05/17/2019  PCP: Shon Baton, MD  Admit date: 05/17/2019 Discharge date: 05/20/2019  Time spent: 45 minutes  Recommendations for Outpatient Follow-up:  1. Follow up with PCP 1 week. Will need BMET to track sodium level 2. Fluid restriction of 1226ml per day 3. Avoid ETOH  Discharge Diagnoses:  Principal Problem:   Hyponatremia Active Problems:   Cirrhosis of liver with ascites (HCC)   Constrictive pericarditis   Acute on chronic diastolic heart failure (HCC)   ETOH abuse   Essential hypertension   Atrial fibrillation [facility])   Discharge Condition: stable  Diet recommendation: heart healthy. Fluid restriction of 125ml/day  Filed Weights   05/17/19 1955 05/19/19 0719  Weight: 78.1 kg 76.6 kg    History of present illness:  [name] is a 79 y.o. male with medical history significant of EtOH cirrhosis, HTN, A.Flutter s/p ablation, constrictive pericarditis who presented to ED 05/17/19 from cardiology office with hyponatremia. Patient has h/o hyponatremia with sodiums in the upper 120s at baseline. 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 Took dose of Metalozone the day prior and produced '4 gallons' of urine.  Feeling dry at time of admisson.  Reported he is down 20-30 lbs of fluid since this med initiated 2 weeks ago, but isnt entirely clear on timeline.  Thinks LE edema is improved.  Went to cards office day of admission, lab work there showed sodium was down to 121 from 128 on 12/11.   Hospital Course:  #1. Hyponatremia. 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 Sodium level up to 126  s/p Samsca one dose and IV lasix. Patient alert and  oriented but very [place]. Nephrology recommended resuming diuretics. Cardiology evaluated and recommended stopping metolazone and fluid restriction. At discharge sodium 126 close to baseline. Being discharged with fluid restriction and instructions to bet BMET in 1 week.   #2. Constrictive pericarditis. Per cMRI. 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 Evaluated by TCTS who recommend continuation of medical management  #3. Acute on chronic diastolic heart failure. Likely related to #2. Echo 10/20 with EF 60%, mild LVH. Cardiac MRI as noted above. Evaluated by cardiology. See #1. Weight down 4Lbs. Being discharged Lasix 80mg  BID and spironolactone 50mg  bid. Instructed to weigh daily and call MD with weight gain. Weight at discharge 168.86lbs.  #4. Cirrhosis. Related to etoh in setting of heart failure and constrictive pericarditis. Continues to drink. Reports "2 [place] daily". Hx ascites with multiple paracentesis  #5. Atrial fibrillation. Rate controlled. No anticoagulation due to hx GI bleed and cirrhosis.  #6. ETOH. No s/sx withdrawal -CIWA  Procedures:    Consultations:  [facility] nephrology  [facility] cardiothoracic  mclean cards  Discharge Exam: Vitals:   05/19/19 2257 05/20/19 0749  BP: 105/64 91/65  Pulse: 83 (!) 41  Resp: 18 19  Temp: 99 F (37.2 C) (!) 97.3 F (36.3 C)  SpO2: 97% 95%    General: sitting on side of bed dressed and ready for discharge. Denies pain/discomfort Cardiovascular: rrr no mgr  LE edema bilaterally with chronic venous stasis changed Respiratory: normal effort BS clear bilaterally no crackles or wheeze  Discharge Instructions  Discharge Instructions    Call MD for:  persistant dizziness or light-headedness   Complete by: As directed    Call MD for:  severe uncontrolled pain   Complete by: As directed    Call MD for:  temperature >100.4   Complete by: As directed     Diet - low sodium heart healthy   Complete by: As directed    Discharge instructions   Complete by: As directed    Take medication as prescribed Follow up with PCP 1 week as will need basic metabolic panel Fluid restriction of 1239ml per day   Increase activity slowly   Complete by: As directed      Allergies as of 05/20/2019      Reactions   Lisinopril Anaphylaxis, Other (See Comments)   Hyperkalemia, Dizziness   Lipitor [atorvastatin] Other (See Comments)   Marked leg fatigue   Sulfonamide Derivatives Other (See Comments)   UNSPECIFIED REACTION       Medication List    STOP taking these medications   metolazone 2.5 MG tablet Commonly known as: ZAROXOLYN     TAKE these medications   escitalopram 10 MG tablet Commonly known as: LEXAPRO Take 10 mg by mouth daily.   eucerin cream Apply 1 application topically as needed (irritated skin).   folic acid 1 MG tablet Commonly known as: FOLVITE Take 1 tablet (1 mg total) by mouth daily.   furosemide 80 MG tablet Commonly known as: LASIX Take 1 tablet (80 mg total) by mouth 2 (two) times daily. What changed: how much to take   Linzess 145 MCG Caps capsule Generic drug: linaclotide Take 145 mcg by mouth daily as needed (constipation).   LORazepam 0.5 MG tablet Commonly known as: ATIVAN Take 0.5 mg by mouth at bedtime as needed for sleep.   Mucinex Maximum Strength 1200 MG Tb12 Generic drug: Guaifenesin Take 1,200 mg by mouth daily as needed (congestion).   multivitamin with minerals Tabs tablet Take 1 tablet by mouth daily.   omeprazole 20 MG capsule Commonly known as: PRILOSEC Take 1 capsule (20 mg total) by mouth 2 (two) times daily.   potassium chloride 10 MEQ tablet Commonly known as: KLOR-CON Take 2 tablets (20 mEq total) by mouth daily. Take extra 4 tabs on Mon and Friday What changed:   how much to take  when to take this   sodium chloride 0.65 % Soln nasal spray Commonly known as: OCEAN Place 1  spray into both nostrils as needed for congestion.   spironolactone 50 MG tablet Commonly known as: ALDACTONE Take 25 mg by mouth 2 (two) times daily.   thiamine 100 MG tablet Take 1 tablet (100 mg total) by mouth daily.      Allergies  Allergen Reactions  . Lisinopril Anaphylaxis and Other (See Comments)    Hyperkalemia, Dizziness   . Lipitor [Atorvastatin] Other (See Comments)    Marked leg fatigue  . Sulfonamide Derivatives Other (See Comments)    UNSPECIFIED REACTION     Follow-up Information    [facility] Follow up on 05/30/2019.   Specialty: Cardiology Why: 8:30 AM.  Hospital follow-up in heart failure clinic. parking garage code [place] information: [address] [id number] [place] [phone]           The results of significant diagnostics from this hospitalization (including imaging, microbiology, ancillary and laboratory) are listed below for reference.    Significant Diagnostic Studies:  CARDIAC CATHETERIZATION  Result Date: 04/21/2019 Conclusions: 1. 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  There is no significant coronary artery disease involving the major coronary arteries, though there is suggestion of tethering. 2. Moderately elevated left heart filling pressures. 3. Severely elevated right heart and pulmonary artery pressures. 4. There is subtle discordance in the LV/RV pressure tracings suggesting mild constrictive physiology. 5. Normal Fick cardiac output/index. Recommendations: 1. Hemodynamics reviewed in person with Dr. Haroldine Laws.  There is likely a mixed restrictive and constrictive picture.  We favor escalation of diuresis and surgical consultation for consideration of pericardial stripping of the patient fails aggressive medical therapy. 2. We will switch furosemide to torsemide 40 mg BID and have Mr. Lamour  follow-up with Dr. Haroldine Laws in the heart failure clinic in ~2 weeks. 3. Medical therapy of coronary disease involving small branch of OM3. Nelva Bush, MD [facility]   DG Chest Port 1 View  Result Date: 05/17/2019 CLINICAL DATA:  Low potassium levels, increased shortness of breath EXAM: PORTABLE CHEST 1 VIEW COMPARISON:  03/10/2019 FINDINGS: Small bilateral pleural effusions, left greater than right. Bilateral diffuse interstitial thickening. No pneumothorax. Stable cardiomegaly. No acute osseous abnormality. IMPRESSION: 1. Findings most consistent with CHF. Electronically Signed   By: Kathreen Devoid   On: 05/17/2019 20:03   MR CARDIAC MORPHOLOGY W WO CONTRAST  Result Date: 05/18/2019 CLINICAL DATA:  Constrictive pericarditis EXAM: CARDIAC MRI TECHNIQUE: The patient was scanned on a 1.5 Tesla GE magnet. A dedicated cardiac coil was used. Functional imaging was done using Fiesta sequences. 2,3, and 4 chamber views were done to assess for RWMA's. Modified Simpson's rule using a short axis stack was used to calculate an ejection fraction on a dedicated work Conservation officer, nature. The patient received 8 cc of Gadavist. After 10 minutes inversion recovery sequences were used to assess for infiltration and scar tissue. CONTRAST:  Gadavist 8 cc FINDINGS: Moderate left and small right pleural effusions with associated compressive atelectasis. Normal left ventricular size with normal wall thickness. Normal wall motion with EF 64%. Normal right ventricular size and systolic function, EF 74%. Moderately dilated left atrium, moderately dilated right atrium. Trileaflet aortic valve with calcification. No significant stenosis or regurgitation noted. Mild mitral regurgitation. T2 images were difficult (not interpretable). Free breathing images showed clear evidence for ventricular interdependence (septal shift towards the left ventricle with inspiration). Thickened pericardium reaching up to 10 mm along  the RV free wall, 6 mm along the LV lateral wall. On delayed enhancement imaging, diffuse pericardial late gadolinium enhancement (LGE) was noted. There was also an area of 26-49% wall thickness subendocardial LGE in the basal inferolateral wall. Measurements: LVEDV 111 mL LVSV 72 mL LVEF 64% RVEDV 123 mL RVSV 57 mL RVEF 46% IMPRESSION: Thickened pericardium with extensive pericardial LGE noted. Ventricular interdependence noted by free breathing sequences. Normal LV and RV EF. These findings are highly suggestive of constrictive pericarditis. Dalton Mclean Electronically Signed   By: Loralie Champagne M.D.   On: 05/18/2019 20:30   IR ABDOMEN US LIMITED  Result Date: 05/04/2019 CLINICAL DATA:  79 year old male with ascites. Evaluate for paracentesis. EXAM: LIMITED ABDOMEN ULTRASOUND FOR ASCITES TECHNIQUE: Limited ultrasound survey for ascites was performed in all four abdominal quadrants. COMPARISON:  None. FINDINGS: Sonographic evaluation was performed of the 4 quadrants of the abdomen. There is no significant ascites. Paracentesis was deferred. IMPRESSION: 1. Trace ascites, insufficient for drainage. Electronically Signed   By:  Jacqulynn Cadet M.D.   On: 05/04/2019 18:57    Microbiology: Recent Results (from the past 240 hour(s))  SARS CORONAVIRUS 2 (TAT 6-24 HRS) Nasopharyngeal Nasopharyngeal Swab     Status: None   Collection Time: 05/17/19  8:41 PM   Specimen: Nasopharyngeal Swab  Result Value Ref Range Status   SARS Coronavirus 2 NEGATIVE NEGATIVE Final    Comment: (NOTE) SARS-CoV-2 target nucleic acids are NOT DETECTED. The SARS-CoV-2 RNA is generally detectable in upper and lower respiratory specimens during the acute phase of infection. Negative results do not preclude SARS-CoV-2 infection, do not rule out co-infections with other pathogens, and should not be used as the sole basis for treatment or other patient management decisions. Negative results must be combined with clinical  observations, patient history, and epidemiological information. The expected result is Negative. Fact Sheet for Patients: SugarRoll.be Fact Sheet for Healthcare Providers: https://www.woods-mathews.com/ This test is not yet approved or cleared by the [place] FDA and  has been authorized for detection and/or diagnosis of SARS-CoV-2 by FDA under an Emergency Use Authorization (EUA). This EUA will remain  in effect (meaning this test can be used) for the duration of the COVID-19 declaration under Section 56 4(b)(1) of the Act, 21 U.S.C. section 360bbb-3(b)(1), unless the authorization is terminated or revoked sooner. Performed at [facility], [place] [address] [place], [place] [address]      Labs: Basic Metabolic Panel: Recent Labs  Lab 05/17/19 1509 05/17/19 2053 05/18/19 0426 05/18/19 1811 05/19/19 0735 05/19/19 1344 05/19/19 2006 05/20/19 0157 05/20/19 0701  NA 121*  --  119* 119* 126* 127* 126* 126* 126*  K 4.6  --  3.7 3.8 3.5  --   --  3.6  --   CL 82*  --  80* 79* 84*  --   --  85*  --   CO2 29  --  26 29 29   --   --  31  --   GLUCOSE 124*  --  152* 119* 107*  --   --  114*  --   BUN 14  --  12 12 12   --   --  17  --   CREATININE 0.73  --  0.70 0.76 0.73  --   --  0.78  --   CALCIUM 9.4  --  8.9 9.1 9.4  --   --  9.2  --   MG  --  1.8  --   --   --   --   --   --   --   PHOS  --  3.9  --   --   --   --   --   --   --    Liver Function Tests: Recent Labs  Lab 05/17/19 1509  AST 34  ALT 23  ALKPHOS 184*  BILITOT 2.6*  PROT 7.8  ALBUMIN 3.3*   No results for input(s): LIPASE, AMYLASE in the last 168 hours. No results for input(s): AMMONIA in the last 168 hours. CBC: Recent Labs  Lab 05/17/19 1509 05/19/19 0735 05/20/19 0157  WBC 6.2 4.7 3.9*  NEUTROABS  --  3.1 2.5  HGB 12.1* 12.6* 12.0*  HCT 34.4* 36.0* 34.2*  MCV 86.9 86.1 86.1  PLT 218 219 208   Cardiac Enzymes: No results for input(s):  CKTOTAL, CKMB, CKMBINDEX, TROPONINI in the last 168 hours. BNP: BNP (last 3 results) Recent Labs    08/25/18 1316 03/09/19 0034 05/17/19 1509  BNP  586.3* 303.9* 152.2*    ProBNP (last 3 results) Recent Labs    04/29/19 1450  PROBNP 1,007*    CBG: No results for input(s): GLUCAP in the last 168 hours.     SignedRadene Gunning NP  [facility] 05/20/2019, 11:44 AM

## 2019-05-20 NOTE — Progress Notes (Addendum)
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 Pt was on lasix 80 mg, spironolactone 25 mg BID and recently added metolazone 2.5 mg twice a week.   Diuretics initially held but has received 1 dose of tolvaptan 15 mg and then 2 doses of IV Lasix 40 mg. Na up to 126 and has stayed stable. Wt down 4 lb, 172>>168. SCr stable. 0.78.   cMRi c/w constrictive pericarditis   cMRi 05/18/19  IMPRESSION: Thickened pericardium with extensive pericardial LGE noted. Ventricular interdependence noted by free breathing sequences. Normal LV and RV EF. These findings are highly suggestive of constrictive pericarditis.  Today pt states "I feel like a million bucks and I want to go home". He says that he will not stay another day. He had an issue with the nurse from last night.   Inpatient Medications    Scheduled Meds: . enoxaparin (LOVENOX) injection  40 mg Subcutaneous Q24H  . folic acid  1 mg Oral Daily  . linaclotide  145 mcg Oral QAC breakfast  . multivitamin with minerals  1 tablet Oral Daily  . thiamine  100 mg Oral Daily   Or  . thiamine  100 mg Intravenous Daily   Continuous Infusions:  PRN Meds: acetaminophen **OR** acetaminophen, guaiFENesin, LORazepam **OR** LORazepam, ondansetron **OR** ondansetron (ZOFRAN) IV   Vital Signs    Vitals:   05/19/19 0719 05/19/19 1552 05/19/19 2257 05/20/19 0749  BP: 116/71 105/66 105/64 91/65  Pulse: 79 89 83 (!) 41  Resp:   18 19  Temp: 97.7 F (36.5 C) (!) 97.5 F (36.4 C) 99 F (37.2 C) (!) 97.3 F (36.3 C)  TempSrc: Oral Oral Oral   SpO2: 99% 100% 97% 95%  Weight: 76.6 kg     Height:         Intake/Output Summary (Last 24 hours) at 05/20/2019 0804 Last data filed at 05/19/2019 2305 Gross per 24 hour  Intake 240 ml  Output 1435 ml  Net -1195 ml   Last 3 Weights 05/19/2019 05/17/2019 05/05/2019  Weight (lbs) 168 lb 14 oz 172 lb 2.9 oz 195 lb 3.2 oz  Weight (kg) 76.6 kg 78.1 kg 88.542 kg      Telemetry    Not on telemetry  ECG    No new tracings for review  Physical Exam   GEN: No acute distress.   Neck: No JVD Cardiac: RRR, no murmurs, rubs, or gallops.  Respiratory: Clear to auscultation bilaterally. GI: Soft, nontender, mildly distended MS: Tight bilateral LE edema with dark skin changes Neuro:  Nonfocal  Psych: Normal affect, slightly agitated and wanting to go home.   Labs    High Sensitivity Troponin:  No results for input(s): TROPONINIHS in the last 720 hours.    Chemistry Recent Labs  Lab 05/17/19 1509 05/18/19 1811 05/19/19 0735 05/19/19 2006 05/20/19 0157 05/20/19 0701  NA 121* 119* 126* 126* 126* 126*  K 4.6 3.8 3.5  --  3.6  --   CL 82* 79* 84*  --  85*  --   CO2 29 29 29   --  31  --   GLUCOSE 124* 119* 107*  --  114*  --  BUN 14 12 12   --  17  --   CREATININE 0.73 0.76 0.73  --  0.78  --   CALCIUM 9.4 9.1 9.4  --  9.2  --   PROT 7.8  --   --   --   --   --   ALBUMIN 3.3*  --   --   --   --   --   AST 34  --   --   --   --   --   ALT 23  --   --   --   --   --   ALKPHOS 184*  --   --   --   --   --   BILITOT 2.6*  --   --   --   --   --   GFRNONAA >60 >60 >60  --  >60  --   GFRAA >60 >60 >60  --  >60  --   ANIONGAP 10 11 13   --  10  --      Hematology Recent Labs  Lab 05/17/19 1509 05/19/19 0735 05/20/19 0157  WBC 6.2 4.7 3.9*  RBC 3.96* 4.18* 3.97*  HGB 12.1* 12.6* 12.0*  HCT 34.4* 36.0* 34.2*  MCV 86.9 86.1 86.1  MCH 30.6 30.1 30.2  MCHC 35.2 35.0 35.1  RDW 15.9* 15.9* 15.8*  PLT 218 219 208    BNP Recent Labs  Lab 05/17/19 1509  BNP 152.2*     DDimer No results for input(s): DDIMER in the last 168  hours.   Radiology    MR CARDIAC MORPHOLOGY W WO CONTRAST  Result Date: 05/18/2019 CLINICAL DATA:  Constrictive pericarditis EXAM: CARDIAC MRI TECHNIQUE: The patient was scanned on a 1.5 Tesla GE magnet. A dedicated cardiac coil was used. Functional imaging was done using Fiesta sequences. 2,3, and 4 chamber views were done to assess for RWMA's. Modified Simpson's rule using a short axis stack was used to calculate an ejection fraction on a dedicated work Conservation officer, nature. The patient received 8 cc of Gadavist. After 10 minutes inversion recovery sequences were used to assess for infiltration and scar tissue. CONTRAST:  Gadavist 8 cc FINDINGS: Moderate left and small right pleural effusions with associated compressive atelectasis. Normal left ventricular size with normal wall thickness. Normal wall motion with EF 64%. Normal right ventricular size and systolic function, EF 09%. Moderately dilated left atrium, moderately dilated right atrium. Trileaflet aortic valve with calcification. No significant stenosis or regurgitation noted. Mild mitral regurgitation. T2 images were difficult (not interpretable). Free breathing images showed clear evidence for ventricular interdependence (septal shift towards the left ventricle with inspiration). Thickened pericardium reaching up to 10 mm along the RV free wall, 6 mm along the LV lateral wall. On delayed enhancement imaging, diffuse pericardial late gadolinium enhancement (LGE) was noted. There was also an area of 26-49% wall thickness subendocardial LGE in the basal inferolateral wall. Measurements: LVEDV 111 mL LVSV 72 mL LVEF 64% RVEDV 123 mL RVSV 57 mL RVEF 46% IMPRESSION: Thickened pericardium with extensive pericardial LGE noted. Ventricular interdependence noted by free breathing sequences. Normal LV and RV EF. These findings are highly suggestive of constrictive pericarditis. Iolanda Folson Electronically Signed   By: Loralie Champagne M.D.   On:  05/18/2019 20:30    Cardiac Studies     MR CARDIAC MORPHOLOGY W WO CONTRAST    Result Date: 05/18/2019  CLINICAL DATA:  Constrictive pericarditis EXAM: CARDIAC MRI TECHNIQUE: The patient was  scanned on a 1.5 Tesla GE magnet. A dedicated cardiac coil was used. Functional imaging was done using Fiesta sequences. 2,3, and 4 chamber views were done to assess for RWMA's. Modified Simpson's rule using a short axis stack was used to calculate an ejection fraction on a dedicated work Conservation officer, nature. The patient received 8 cc of Gadavist. After 10 minutes inversion recovery sequences were used to assess for infiltration and scar tissue. CONTRAST:  Gadavist 8 cc FINDINGS: Moderate left and small right pleural effusions with associated compressive atelectasis. Normal left ventricular size with normal wall thickness. Normal wall motion with EF 64%. Normal right ventricular size and systolic function, EF 80%. Moderately dilated left atrium, moderately dilated right atrium. Trileaflet aortic valve with calcification. No significant stenosis or regurgitation noted. Mild mitral regurgitation. T2 images were difficult (not interpretable). Free breathing images showed clear evidence for ventricular interdependence (septal shift towards the left ventricle with inspiration). Thickened pericardium reaching up to 10 mm along the RV free wall, 6 mm along the LV lateral wall. On delayed enhancement imaging, diffuse pericardial late gadolinium enhancement (LGE) was noted. There was also an area of 26-49% wall thickness subendocardial LGE in the basal inferolateral wall. Measurements: LVEDV 111 mL LVSV 72 mL LVEF 64% RVEDV 123 mL RVSV 57 mL RVEF 46% IMPRESSION: Thickened pericardium with extensive pericardial LGE noted. Ventricular interdependence noted by free breathing sequences. Normal LV and RV EF. These findings are highly suggestive of constrictive pericarditis. Breven Guidroz Electronically Signed   By:  Loralie Champagne M.D.   On: 05/18/2019 20:30   RIGHT/LEFT HEART CATH AND CORONARY ANGIOGRAPHY  04/21/2019   Conclusions: 1. 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  There is no significant coronary artery disease involving the major coronary arteries, though there is suggestion of tethering. 2. Moderately elevated left heart filling pressures. 3. Severely elevated right heart and pulmonary artery pressures. 4. There is subtle discordance in the LV/RV pressure tracings suggesting mild constrictive physiology. 5. Normal Fick cardiac output/index.  Recommendations: 1. Hemodynamics reviewed in person with Dr. Haroldine Laws.  There is likely a mixed restrictive and constrictive picture.  We favor escalation of diuresis and surgical consultation for consideration of pericardial stripping of the patient fails aggressive medical therapy. 2. We will switch furosemide to torsemide 40 mg BID and have Mr. Trickel follow-up with Dr. Haroldine Laws in the heart failure clinic in ~2 weeks. 3. Medical therapy of coronary disease involving small branch of OM3.  Nelva Bush, MD     Patient Profile     79 y.o. male with a hx of mild constrictive pericarditis, alcoholic cirrhosis c/b portal HTN and ascited, HTN, aflutter s/p ablation,and hyponatremiawho is being seen today for the evaluation of hyponatremia.   Assessment & Plan    1. Hyponatremia -Suspect hypervolemic hyponatremia in pt with cirrhosis and worsened by metolazone use at home. Na 119 on presentation. No confusion or seizure activity.  -Pt was given one dose of tolvaptan 15 mg.  -Continues on fluid restriction.  -Sodium level now appears stable at 126.  -Pt has no altered LOC or obvious neurologic changes.   2. 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  -Continues to be volume overloaded. He was taking lasix 80 mg, spironolactone 25 mg BID and recently added  metolazone 2.5 mg twice a week at home.  -Has now been given 2 dose of lasix 40 mg IV. He has good  UOP after first dose of 3.5L, less UOP yesterday with 1.8L.  -Wt initially decreased by 4 lbs, no wt in Epic for today. -Na is stable, still low but better. Renal function stable with SCr 0.78. K+ 3.6 -Lasix 80 mg BID is now ordered.  -Plan to send home on Lasix 80 mg bid + spironolactone 50 mg bid (ascites with paracenteses) without using metolazone given profound hyponatremia at admission. Will need to stay fluid restricted at home.   3. Constrictive pericarditis -Cardiac MRI is highly suggestive of constrictive pericarditis. There was pericardial calcification on CT from 9/20. He has a pericardial knock.  -Right heart cath in 04/2019 suggested likely mixed restrictive and constrictive picture. Recommendation to escalate diuresis which is now limited by hyponatremia.  -TCTS evaluated the patient for possible surgical intervention but determined that the patient is not a candidate for pericardiectomy due to coexistent advanced liver failure.  -Continue medical management.   4. Cirrhosis Cirrhotic liver with history of ascites and multiple paracenteses. Suspected ETOH related, but also RV failure in the setting of constrictive pericarditis maybe contributing.   5. CAD -Cardiac cath 04/2019 showed 99% stenosis of small distal OM3, treated medically. No significant disease of the major coronary arteries. This fits the small area of inferolateral subendocardial LGE seen on cMRI.   6. Atrial fibrillation, permanent -Rate controlled on no medications. -Not anticoagulated due to advanced liver diesase and GI bleeding.    For questions or updates, please contact [place] Please consult [email or web address] for contact info under    Signed, Daune Perch, NP  05/20/2019, 8:04 AM    Patient seen with NP, agree with the above note.   Not surgical candidate for constrictive pericarditis due to  advanced cirrhosis.   Hypervolemic hyponatremia, worsened by cirrhosis.  Volume will likely continue to be difficult to manage.  Sodium now up to 126 and stable.  No symptoms of hyponatremia.    He is not volume overloaded by exam today and creatinine stable.   I think that he can go home today.  Would resume home regimen of Lasix 80 mg po bid and spironolactone 50 mg po bid (for significant ascites history).  He will need BMET next week.  Would avoid future metolazone use.  I discussed fluid limitation at home with him extensively.  He has continued to drink beer, Gatorade, etc.    Loralie Champagne 05/20/2019 10:33 AM

## 2019-05-25 DIAGNOSIS — F1010 Alcohol abuse, uncomplicated: Secondary | ICD-10-CM | POA: Diagnosis not present

## 2019-05-25 DIAGNOSIS — I311 Chronic constrictive pericarditis: Secondary | ICD-10-CM | POA: Diagnosis not present

## 2019-05-25 DIAGNOSIS — I5033 Acute on chronic diastolic (congestive) heart failure: Secondary | ICD-10-CM | POA: Diagnosis not present

## 2019-05-25 DIAGNOSIS — N1831 Chronic kidney disease, stage 3a: Secondary | ICD-10-CM | POA: Diagnosis not present

## 2019-05-25 DIAGNOSIS — I4892 Unspecified atrial flutter: Secondary | ICD-10-CM | POA: Diagnosis not present

## 2019-05-25 DIAGNOSIS — K7460 Unspecified cirrhosis of liver: Secondary | ICD-10-CM | POA: Diagnosis not present

## 2019-05-25 DIAGNOSIS — I130 Hypertensive heart and chronic kidney disease with heart failure and stage 1 through stage 4 chronic kidney disease, or unspecified chronic kidney disease: Secondary | ICD-10-CM | POA: Diagnosis not present

## 2019-05-25 DIAGNOSIS — E871 Hypo-osmolality and hyponatremia: Secondary | ICD-10-CM | POA: Diagnosis not present

## 2019-05-26 DIAGNOSIS — E7849 Other hyperlipidemia: Secondary | ICD-10-CM | POA: Diagnosis not present

## 2019-05-26 DIAGNOSIS — E871 Hypo-osmolality and hyponatremia: Secondary | ICD-10-CM | POA: Diagnosis not present

## 2019-05-30 ENCOUNTER — Encounter (HOSPITAL_COMMUNITY): Payer: Medicare HMO

## 2019-06-02 DIAGNOSIS — N1831 Chronic kidney disease, stage 3a: Secondary | ICD-10-CM | POA: Diagnosis not present

## 2019-06-06 ENCOUNTER — Telehealth (HOSPITAL_COMMUNITY): Payer: Self-pay | Admitting: Cardiology

## 2019-06-06 NOTE — Telephone Encounter (Signed)

## 2019-06-07 ENCOUNTER — Telehealth (HOSPITAL_COMMUNITY): Payer: Self-pay

## 2019-06-07 ENCOUNTER — Encounter (HOSPITAL_COMMUNITY): Payer: Self-pay

## 2019-06-07 ENCOUNTER — Ambulatory Visit (HOSPITAL_COMMUNITY)
Admission: RE | Admit: 2019-06-07 | Discharge: 2019-06-07 | Disposition: A | Discharge status: Home / Self Care | Payer: Medicare HMO | Source: Ambulatory Visit | Attending: Cardiology | Admitting: Cardiology

## 2019-06-07 ENCOUNTER — Other Ambulatory Visit: Payer: Self-pay

## 2019-06-07 VITALS — BP 138/78 | HR 84 | Wt 176.2 lb

## 2019-06-07 DIAGNOSIS — I311 Chronic constrictive pericarditis: Secondary | ICD-10-CM | POA: Insufficient documentation

## 2019-06-07 DIAGNOSIS — Z882 Allergy status to sulfonamides status: Secondary | ICD-10-CM | POA: Insufficient documentation

## 2019-06-07 DIAGNOSIS — Z8249 Family history of ischemic heart disease and other diseases of the circulatory system: Secondary | ICD-10-CM | POA: Insufficient documentation

## 2019-06-07 DIAGNOSIS — I4821 Permanent atrial fibrillation: Secondary | ICD-10-CM | POA: Diagnosis not present

## 2019-06-07 DIAGNOSIS — I509 Heart failure, unspecified: Secondary | ICD-10-CM | POA: Insufficient documentation

## 2019-06-07 DIAGNOSIS — N189 Chronic kidney disease, unspecified: Secondary | ICD-10-CM | POA: Diagnosis not present

## 2019-06-07 DIAGNOSIS — I130 Hypertensive heart and chronic kidney disease with heart failure and stage 1 through stage 4 chronic kidney disease, or unspecified chronic kidney disease: Secondary | ICD-10-CM | POA: Insufficient documentation

## 2019-06-07 DIAGNOSIS — D509 Iron deficiency anemia, unspecified: Secondary | ICD-10-CM | POA: Diagnosis not present

## 2019-06-07 DIAGNOSIS — E1122 Type 2 diabetes mellitus with diabetic chronic kidney disease: Secondary | ICD-10-CM | POA: Insufficient documentation

## 2019-06-07 DIAGNOSIS — K219 Gastro-esophageal reflux disease without esophagitis: Secondary | ICD-10-CM | POA: Diagnosis not present

## 2019-06-07 DIAGNOSIS — Z79899 Other long term (current) drug therapy: Secondary | ICD-10-CM | POA: Insufficient documentation

## 2019-06-07 DIAGNOSIS — R9431 Abnormal electrocardiogram [ECG] [EKG]: Secondary | ICD-10-CM | POA: Insufficient documentation

## 2019-06-07 DIAGNOSIS — I5032 Chronic diastolic (congestive) heart failure: Secondary | ICD-10-CM | POA: Diagnosis not present

## 2019-06-07 DIAGNOSIS — K7031 Alcoholic cirrhosis of liver with ascites: Secondary | ICD-10-CM | POA: Diagnosis not present

## 2019-06-07 DIAGNOSIS — F329 Major depressive disorder, single episode, unspecified: Secondary | ICD-10-CM | POA: Insufficient documentation

## 2019-06-07 DIAGNOSIS — Z888 Allergy status to other drugs, medicaments and biological substances status: Secondary | ICD-10-CM | POA: Diagnosis not present

## 2019-06-07 DIAGNOSIS — I2510 Atherosclerotic heart disease of native coronary artery without angina pectoris: Secondary | ICD-10-CM | POA: Diagnosis not present

## 2019-06-07 DIAGNOSIS — Z8042 Family history of malignant neoplasm of prostate: Secondary | ICD-10-CM | POA: Insufficient documentation

## 2019-06-07 DIAGNOSIS — F1021 Alcohol dependence, in remission: Secondary | ICD-10-CM | POA: Insufficient documentation

## 2019-06-07 DIAGNOSIS — I319 Disease of pericardium, unspecified: Secondary | ICD-10-CM | POA: Diagnosis not present

## 2019-06-07 DIAGNOSIS — Z823 Family history of stroke: Secondary | ICD-10-CM | POA: Insufficient documentation

## 2019-06-07 DIAGNOSIS — E871 Hypo-osmolality and hyponatremia: Secondary | ICD-10-CM | POA: Insufficient documentation

## 2019-06-07 DIAGNOSIS — F419 Anxiety disorder, unspecified: Secondary | ICD-10-CM | POA: Insufficient documentation

## 2019-06-07 DIAGNOSIS — I4892 Unspecified atrial flutter: Secondary | ICD-10-CM | POA: Diagnosis not present

## 2019-06-07 LAB — CBC
HCT: 38.4 % — ABNORMAL LOW (ref 39.0–52.0)
Hemoglobin: 12.8 g/dL — ABNORMAL LOW (ref 13.0–17.0)
MCH: 30.2 pg (ref 26.0–34.0)
MCHC: 33.3 g/dL (ref 30.0–36.0)
MCV: 90.6 fL (ref 80.0–100.0)
Platelets: 201 10*3/uL (ref 150–400)
RBC: 4.24 MIL/uL (ref 4.22–5.81)
RDW: 15.9 % — ABNORMAL HIGH (ref 11.5–15.5)
WBC: 4.4 10*3/uL (ref 4.0–10.5)
nRBC: 0 % (ref 0.0–0.2)

## 2019-06-07 LAB — BASIC METABOLIC PANEL
Anion gap: 12 (ref 5–15)
BUN: 14 mg/dL (ref 8–23)
CO2: 26 mmol/L (ref 22–32)
Calcium: 9.3 mg/dL (ref 8.9–10.3)
Chloride: 85 mmol/L — ABNORMAL LOW (ref 98–111)
Creatinine, Ser: 0.8 mg/dL (ref 0.61–1.24)
GFR calc Af Amer: >60 mL/min (ref >60)
GFR calc non Af Amer: >60 mL/min (ref >60)
Glucose, Bld: 127 mg/dL — ABNORMAL HIGH (ref 70–99)
Potassium: 4.1 mmol/L (ref 3.5–5.1)
Sodium: 123 mmol/L — ABNORMAL LOW (ref 135–145)

## 2019-06-07 NOTE — Telephone Encounter (Signed)
COVID-19 pre-appointment screening questions:   Do you have a history of COVID-19 or a positive test result in the past 7-10 days?  To the best of your knowledge, have you been in close contact with anyone with a confirmed diagnosis of COVID 19?  Have you had any one or more of the following: Fever, chills, cough, shortness of breath (out of the normal for you) or any flu-like symptoms?  Are you experiencing any of the following symptoms that is new or out of usual for you:  . Ear, nose or throat discomfort . Sore throat . Headache . Muscle Pain . Diarrhea . Loss of taste or smell   Reviewed all the following with patient: . Use of hand sanitizer when entering the building . Everyone is required to wear a mask in the building, if you do not have a mask we are happy to provide you with one when you arrive . NO Visitor guidelines   If patient answers YES to any of questions they must change to a virtual visit and place note in comments about symptoms    NO ANSWER. LEFT VOICEMAIL

## 2019-06-07 NOTE — Progress Notes (Signed)
ReDS Vest / Clip - 06/07/19 1000      ReDS Vest / Clip   Station Marker  C    Ruler Value  31    ReDS Value Range  Moderate volume overload    ReDS Actual Value  28

## 2019-06-07 NOTE — Progress Notes (Signed)
ADVANCED HF CLINIC CONSULT NOTE  Referring Physician: Dr. Saunders Revel Primary Care: Dr. Shon Baton  Primary Cardiologist: Dr. Meda Coffee AHF: Dr. Haroldine Laws   HPI:  [name] is a 79 y.o. 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 Saunders Revel for further management of constrictive pericarditis.   He had an aflutter ablation prior to 2011. In 2011, he was noted to have no recurrences of flutter, but was maintained on toprol for Afib. Repeat nuc in 2010 was unchanged, no evidence of ischemia.  Says he started dealing with ascites and having paracenteses at least two years ago. Ascites felt due to ETOH cirrhosis. Having paracentesis about every 2 weeks. Still drinking 2-3 Betts/day.   CT chest in 9/20 showed 1. Extensive pericardial thickening and calcification with morphologic changes indicative of constrictive physiology. Further clinical evaluation is recommended. 2. Liver has a shrunken appearance and nodular contour, indicative of underlying cirrhosis with evidence of portal venous hypertension and moderate volume of ascites, similar to the prior examination.  Admitted 02/2019 with abdominal pain. Feltsignificantly better after B/L thoracentesis, paracentesisand increasing diuretics dose. He hadsignificant diffuse circumferential calcification of his pericardiumon prior chest CT. Echo with preserved LV function and constrictive physiology.  Underwent R& L cath 12/3 showing 99% stenosis of ostium of OM3, small vessel>> medical therapy. Moderately elevatd left heart filling pressures. Severely elevated right heart and pulmonary artery pressures. There is subtle discordance in the LV/RV pressure tracings suggesting mild constrictive physiology. Dr. Haroldine Laws reviewed the tracings personally in the cath lab and  agreed. Changed Lasix to torsemide 40mg  BID. Patient reports he took torsemide for 2 days and his fluid status got worse.  He noted increase lower extremity swelling and shortness of breath and then he switched back to Lasix 40 mg twice daily.  This was further increased to 80 bid. Also on spiro 50 bid.   He had initial [name] consultation w/ Dr. Haroldine Laws on 05/05/19 and continued w/ volume overloaded and metolazone 2.5 mg was added 2 x/week. He had repeat labs, 2 weeks later, at PCP office which showed profound hyponatremia, Na 119 and he was sent to the ED for admission and further w/u. Diuretics initially held. Metolazone permanently discontinued. He received 1 dose of tolvaptan 15 mg. Na improved and stabilized ~126. Neuro status remained stable. No AMS nor seizures.   For further w/u of his pericarditis, he underwent cMRI during hospitalization which was highly suggestive of constrictive pericarditis. TCTS evaluated the patient for possible surgical intervention but determined that the patient is not a candidate for pericardiectomy due to coexistent advanced liver failure. Medical management w/ diuretics recommended. Prior to d/c, he did require IV Lasix but was later transitioned to PO. SCr and Na remained stable. He was sent home on Lasix 80 mg bid + spironolactone 50 mg bid (ascites with paracenteses) without metolazone. Also advised to fluid restrict.   He presents back to clinic for post hospital f/u. States he feels "great". Despite recommended diuretic regimen at discharge, pt has selft decreased his lasix down to 40 mg bid and spironolactone to 25 mg bid because he felt that he was "urinating too much". Despite self dose reduction, he has done well from a volume standpoint and has not required repeat thoracentesis in "almost 2 months", per pt report. No evidence of gross volume overload on exam nor by  diagnostic evaluation. ReDs clip measurement 28%. No dyspnea. Denies symptoms of confusion/ AMS.  Continues to drink ETOH, 2 Doughman a night, which is significant reduction compared to his past history. BP stable at 138/78.     Review of Systems: [y] = yes, [ ]  = no   General: Weight gain [ ] ; Weight loss [name] ]; Anorexia [ ] ; Fatigue [name] ]; Fever [ ] ; Chills [ ] ; Weakness [ ]   Cardiac: Chest pain/pressure [ ] ; Resting SOB [ ] ; Exertional SOB [ y]; Orthopnea [ ] ; Pedal Edema [ y]; Palpitations [ ] ; Syncope [ ] ; Presyncope [ ] ; Paroxysmal nocturnal dyspnea[ ]   Pulmonary: Cough [ ] ; Wheezing[ ] ; Hemoptysis[ ] ; Sputum [ ] ; Snoring [ ]   GI: Vomiting[ ] ; Dysphagia[ ] ; Melena[ ] ; Hematochezia [ ] ; Heartburn[ ] ; Abdominal pain [ ] ; Constipation [ ] ; Diarrhea [ ] ; BRBPR [ ]   GU: Hematuria[ ] ; Dysuria [ ] ; Nocturia[ ]   Vascular: Pain in legs with walking [ ] ; Pain in feet with lying flat [ ] ; Non-healing sores [ ] ; Stroke [ ] ; TIA [ ] ; Slurred speech [ ] ;  Neuro: Headaches[ ] ; Vertigo[ ] ; Seizures[ ] ; Paresthesias[ ] ;Blurred vision [ ] ; Diplopia [ ] ; Vision changes [ ]   Ortho/Skin: Arthritis [name] ]; Joint pain [name] ]; Muscle pain [ ] ; Joint swelling [ ] ; Back Pain [ ] ; Rash [ ]   Psych: Depression[ y]; Anxiety[ ]   Heme: Bleeding problems [ y]; Clotting disorders [ ] ; Anemia [ ]   Endocrine: Diabetes [ ] ; Thyroid dysfunction[ ]    Past Medical History:  Diagnosis Date  . Acute respiratory failure with hypoxia ([place]) 03/09/2019  . Adenomatous polyps 06/2004  . Agent orange exposure   . Alcoholic cirrhosis of liver with ascites ([place])   . Alcoholism ([place])   . Allergy   . Anxiety    PTSD- no meds  . Atrial fibrillation ([place])   . Atrial flutter ([place])    had ablation   . CAD in native artery    cath 04/2019: 99% stenosis of ostium of OM>> small vessel>> RX tx  . Chronic constrictive pericarditis    a. cath 04/2019 - moderate elevated L heart pressure; severely edevated R heart adn pulomary artery pressures   . Chronic kidney disease    "beginnings of kidney failure"- 3-4 years ago  . Depression   .  Diabetes mellitus without complication ([facility])    no meds  . Diverticulosis   . GERD (gastroesophageal reflux disease)   . Hemorrhoids   . Hiatal hernia   . Hx of hernia repair   . Hyperlipidemia   . Hypertension   . Hyponatremia 08/2018  . Iron deficiency anemia   . Lung nodules    bilateral  . Pedal edema   . Pneumonia    06-2016  . PTSD (post-traumatic stress disorder)   . Substance abuse ([place])    alcohol use  . Ulcer   . Varicose veins     Current Outpatient Medications  Medication Sig Dispense Refill  . escitalopram (LEXAPRO) 10 MG tablet Take 10 mg by mouth daily.    . folic acid (FOLVITE) 1 MG tablet Take 1 tablet (1 mg total) by mouth daily. 30 tablet 1  . furosemide (LASIX) 80 MG tablet Take 80 mg by mouth. Take one tab (80mg ) every morning and (40mg ) 0.5 tab every evening.    . Guaifenesin (MUCINEX MAXIMUM STRENGTH) 1200 MG TB12 Take 1,200 mg by mouth daily as needed (congestion).    Marland Kitchen linaclotide Rolan Lipa)  145 MCG CAPS capsule Take 145 mcg by mouth daily as needed (constipation).    . LORazepam (ATIVAN) 0.5 MG tablet Take 0.5 mg by mouth at bedtime as needed for sleep.     . Multiple Vitamin (MULTIVITAMIN WITH MINERALS) TABS tablet Take 1 tablet by mouth daily.    Marland Kitchen omeprazole (PRILOSEC) 20 MG capsule Take 1 capsule (20 mg total) by mouth 2 (two) times daily. 60 capsule 5  . potassium chloride (KLOR-CON) 10 MEQ tablet Take 2 tablets (20 mEq total) by mouth daily. Take extra 4 tabs on Mon and Friday 100 tablet 5  . Skin Protectants, Misc. (EUCERIN) cream Apply 1 application topically as needed (irritated skin).    . sodium chloride (OCEAN) 0.65 % SOLN nasal spray Place 1 spray into both nostrils as needed for congestion.    Marland Kitchen spironolactone (ALDACTONE) 50 MG tablet Take 25 mg by mouth 2 (two) times daily.     Marland Kitchen thiamine 100 MG tablet Take 1 tablet (100 mg total) by mouth daily. 30 tablet 1   No current facility-administered medications for this encounter.    Allergies   Allergen Reactions  . Lisinopril Anaphylaxis and Other (See Comments)    Hyperkalemia, Dizziness   . Lipitor [Atorvastatin] Other (See Comments)    Marked leg fatigue  . Sulfonamide Derivatives Other (See Comments)    UNSPECIFIED REACTION        Social History   Socioeconomic History  . Marital status: Single    Spouse name: Not on file  . Number of children: 5  . Years of education: Not on file  . Highest education level: Not on file  Occupational History  . Occupation: retired    Fish farm manager: RETIRED  Tobacco Use  . Smoking status: Never Smoker  . Smokeless tobacco: Never Used  Substance and Sexual Activity  . Alcohol use: Yes    Comment: occasaional beer  . Drug use: No  . Sexual activity: Not on file  Other Topics Concern  . Not on file  Social History Narrative  . Not on file   Social Determinants of Health   Financial Resource Strain:   . Difficulty of Paying Living Expenses: Not on file  Food Insecurity:   . Worried About Charity fundraiser in the Last Year: Not on file  . Ran Out of Food in the Last Year: Not on file  Transportation Needs:   . Lack of Transportation (Medical): Not on file  . Lack of Transportation (Non-Medical): Not on file  Physical Activity:   . Days of Exercise per Week: Not on file  . Minutes of Exercise per Session: Not on file  Stress:   . Feeling of Stress : Not on file  Social Connections:   . Frequency of Communication with Friends and Family: Not on file  . Frequency of Social Gatherings with Friends and Family: Not on file  . Attends Religious Services: Not on file  . Active Member of Clubs or Organizations: Not on file  . Attends Archivist Meetings: Not on file  . Marital Status: Not on file  Intimate Partner Violence:   . Fear of Current or Ex-Partner: Not on file  . Emotionally Abused: Not on file  . Physically Abused: Not on file  . Sexually Abused: Not on file      Family History  Problem Relation  Age of Onset  . Colon polyps Brother   . Prostate cancer Brother   . Heart disease Father  rheumatic fever as child  . Stroke Mother   . Colon cancer Neg Hx   . Esophageal cancer Neg Hx   . Rectal cancer Neg Hx   . Stomach cancer Neg Hx     Vitals:   06/07/19 1000  BP: 138/78  Pulse: 84  SpO2: 98%  Weight: 79.9 kg (176 lb 3.2 oz)    PHYSICAL EXAM: PHYSICAL EXAM: General:  Well appearing elderly WM. No respiratory difficulty. Hard of hearing  HEENT: normal Neck: supple. no JVD. Carotids 2+ bilat; no bruits. No lymphadenopathy or thyromegaly appreciated. Cor: PMI nondisplaced. Regular rate & rhythm. No rubs, gallops or murmurs. Lungs: clear Abdomen: soft, nontender, nondistended. + hepatosplenomegaly. No bruits or masses. Good bowel sounds. Extremities: no cyanosis, clubbing, rash, edema Neuro: alert & oriented x 3, cranial nerves grossly intact. moves all 4 extremities w/o difficulty. Affect pleasant.   ASSESSMENT & PLAN:  1. Hyponatremia -Recent admit for Na level of 119. Suspected 2/2 hypervolemic hyponatremia in pt with cirrhosis and worsened by metolazone use at home.  -Na improved w/ discontinuation of metolazone + dose of tolvaptan 15 mg.  -Na improved and stabilized, 126 at d/c -Now on PO Lasix 40 mg bid + spironolactone 25 mg bid (h/o Cirrohsis w/ ascites) -Will repeat CBC today  -Continue to fluid restrict at home  2. 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  -volume stable on current diuretic regimen. ReDs clip measurement 28%. -Continue Lasix 40 mg bid + spironolactone 25 mg bid. No further use of metoalzone due to h/o severe hyponatremia related to use. -Repeat BMP today -Will need to stay fluid restricted at home.   3. Constrictive pericarditis -Cardiac MRI is highly suggestive of constrictive pericarditis. There was pericardial calcification on CT from 9/20. He has a pericardial knock.  -Right heart cath in 04/2019 suggested  likely mixed restrictive and constrictive picture. Recommendation to escalate diuresis which is now limited by hyponatremia.  -TCTS evaluated the patient for possible surgical intervention but determined that the patient is not a candidate for pericardiectomy due to coexistent advanced liver failure.  - he is hemodynamically stable. No hypotension  -Continue medical management. Diuretics per above.   4. Cirrhosis Cirrhotic liver with history of ascites and multiple paracenteses. Suspected ETOH related, but also RV failure in the setting of constrictive pericarditis maybe contributing.  - continue spironolactone 25 mg bid - further management per outpatient GI  5. CAD -Cardiac cath 04/2019 showed 99% stenosis of small distal OM3, treated medically. No significant disease of the major coronary arteries. This fits the small area of inferolateral subendocardial LGE seen on cMRI.  - no current s/s of angina  - not on asa nor statin due to advanced liver disease and h/o GI bleeding  6. Atrial fibrillation, permanent -Rate controlled on no medications. HR 80s -Not anticoagulated due to advanced liver diesase and GI bleeding.   F/u with Dr. Haroldine Laws in 4 weeks.    Phyllis Abelson, PA-C  10:30 AM       Plan to send home on Lasix 80 mg bid + spironolactone 50 mg bid (ascites with paracenteses) without using metolazone given profound hyponatremia at admission. Will need to stay fluid restricted at home.

## 2019-06-07 NOTE — Patient Instructions (Signed)
Lab work done today. We will notify you of any abnormal lab work. No news is good news!  EKG done today.  Please follow up with the [facility] in 4 weeks.

## 2019-06-10 DIAGNOSIS — E871 Hypo-osmolality and hyponatremia: Secondary | ICD-10-CM | POA: Diagnosis not present

## 2019-07-01 DIAGNOSIS — K7460 Unspecified cirrhosis of liver: Secondary | ICD-10-CM | POA: Diagnosis not present

## 2019-07-04 ENCOUNTER — Observation Stay (HOSPITAL_COMMUNITY)
Admission: EM | Admit: 2019-07-04 | Discharge: 2019-07-06 | Disposition: A | Discharge status: Home / Self Care | Payer: Medicare HMO | Attending: Internal Medicine | Admitting: Internal Medicine

## 2019-07-04 ENCOUNTER — Encounter (HOSPITAL_COMMUNITY): Payer: Medicare HMO | Admitting: Internal Medicine

## 2019-07-04 ENCOUNTER — Other Ambulatory Visit: Payer: Self-pay

## 2019-07-04 DIAGNOSIS — I5032 Chronic diastolic (congestive) heart failure: Secondary | ICD-10-CM | POA: Diagnosis present

## 2019-07-04 DIAGNOSIS — I2510 Atherosclerotic heart disease of native coronary artery without angina pectoris: Secondary | ICD-10-CM | POA: Insufficient documentation

## 2019-07-04 DIAGNOSIS — N189 Chronic kidney disease, unspecified: Secondary | ICD-10-CM | POA: Diagnosis not present

## 2019-07-04 DIAGNOSIS — I700 Atherosclerosis of aorta: Secondary | ICD-10-CM | POA: Diagnosis not present

## 2019-07-04 DIAGNOSIS — E871 Hypo-osmolality and hyponatremia: Secondary | ICD-10-CM | POA: Diagnosis not present

## 2019-07-04 DIAGNOSIS — K219 Gastro-esophageal reflux disease without esophagitis: Secondary | ICD-10-CM | POA: Diagnosis not present

## 2019-07-04 DIAGNOSIS — F419 Anxiety disorder, unspecified: Secondary | ICD-10-CM | POA: Insufficient documentation

## 2019-07-04 DIAGNOSIS — I130 Hypertensive heart and chronic kidney disease with heart failure and stage 1 through stage 4 chronic kidney disease, or unspecified chronic kidney disease: Secondary | ICD-10-CM | POA: Insufficient documentation

## 2019-07-04 DIAGNOSIS — E1122 Type 2 diabetes mellitus with diabetic chronic kidney disease: Secondary | ICD-10-CM | POA: Diagnosis not present

## 2019-07-04 DIAGNOSIS — Z79899 Other long term (current) drug therapy: Secondary | ICD-10-CM | POA: Diagnosis not present

## 2019-07-04 DIAGNOSIS — K5790 Diverticulosis of intestine, part unspecified, without perforation or abscess without bleeding: Secondary | ICD-10-CM | POA: Insufficient documentation

## 2019-07-04 DIAGNOSIS — E785 Hyperlipidemia, unspecified: Secondary | ICD-10-CM | POA: Insufficient documentation

## 2019-07-04 DIAGNOSIS — J90 Pleural effusion, not elsewhere classified: Secondary | ICD-10-CM | POA: Diagnosis not present

## 2019-07-04 DIAGNOSIS — I4811 Longstanding persistent atrial fibrillation: Secondary | ICD-10-CM | POA: Diagnosis not present

## 2019-07-04 DIAGNOSIS — F329 Major depressive disorder, single episode, unspecified: Secondary | ICD-10-CM | POA: Insufficient documentation

## 2019-07-04 DIAGNOSIS — Z20822 Contact with and (suspected) exposure to covid-19: Secondary | ICD-10-CM | POA: Insufficient documentation

## 2019-07-04 DIAGNOSIS — I311 Chronic constrictive pericarditis: Secondary | ICD-10-CM | POA: Diagnosis not present

## 2019-07-04 DIAGNOSIS — K7031 Alcoholic cirrhosis of liver with ascites: Secondary | ICD-10-CM | POA: Diagnosis not present

## 2019-07-04 DIAGNOSIS — I10 Essential (primary) hypertension: Secondary | ICD-10-CM | POA: Diagnosis present

## 2019-07-04 DIAGNOSIS — I4891 Unspecified atrial fibrillation: Secondary | ICD-10-CM | POA: Diagnosis not present

## 2019-07-04 DIAGNOSIS — Z03818 Encounter for observation for suspected exposure to other biological agents ruled out: Secondary | ICD-10-CM | POA: Diagnosis not present

## 2019-07-04 DIAGNOSIS — Z85118 Personal history of other malignant neoplasm of bronchus and lung: Secondary | ICD-10-CM | POA: Insufficient documentation

## 2019-07-04 DIAGNOSIS — K7030 Alcoholic cirrhosis of liver without ascites: Secondary | ICD-10-CM | POA: Diagnosis not present

## 2019-07-04 DIAGNOSIS — I480 Paroxysmal atrial fibrillation: Secondary | ICD-10-CM | POA: Insufficient documentation

## 2019-07-04 HISTORY — DX: Unspecified hearing loss, unspecified ear: H91.90

## 2019-07-04 LAB — BASIC METABOLIC PANEL
Anion gap: 13 (ref 5–15)
BUN: 14 mg/dL (ref 8–23)
CO2: 25 mmol/L (ref 22–32)
Calcium: 9.2 mg/dL (ref 8.9–10.3)
Chloride: 82 mmol/L — ABNORMAL LOW (ref 98–111)
Creatinine, Ser: 0.7 mg/dL (ref 0.61–1.24)
GFR calc Af Amer: >60 mL/min (ref >60)
GFR calc non Af Amer: >60 mL/min (ref >60)
Glucose, Bld: 106 mg/dL — ABNORMAL HIGH (ref 70–99)
Potassium: 4 mmol/L (ref 3.5–5.1)
Sodium: 120 mmol/L — ABNORMAL LOW (ref 135–145)

## 2019-07-04 LAB — CBC
HCT: 37.4 % — ABNORMAL LOW (ref 39.0–52.0)
Hemoglobin: 13.2 g/dL (ref 13.0–17.0)
MCH: 31 pg (ref 26.0–34.0)
MCHC: 35.3 g/dL (ref 30.0–36.0)
MCV: 87.8 fL (ref 80.0–100.0)
Platelets: 207 10*3/uL (ref 150–400)
RBC: 4.26 MIL/uL (ref 4.22–5.81)
RDW: 15.3 % (ref 11.5–15.5)
WBC: 5.7 10*3/uL (ref 4.0–10.5)
nRBC: 0 % (ref 0.0–0.2)

## 2019-07-04 LAB — HEPATIC FUNCTION PANEL
ALT: 27 U/L (ref 0–44)
AST: 31 U/L (ref 15–41)
Albumin: 3.4 g/dL — ABNORMAL LOW (ref 3.5–5.0)
Alkaline Phosphatase: 154 U/L — ABNORMAL HIGH (ref 38–126)
Bilirubin, Direct: 0.6 mg/dL — ABNORMAL HIGH (ref 0.0–0.2)
Indirect Bilirubin: 1.9 mg/dL — ABNORMAL HIGH (ref 0.3–0.9)
Total Bilirubin: 2.5 mg/dL — ABNORMAL HIGH (ref 0.3–1.2)
Total Protein: 7.3 g/dL (ref 6.5–8.1)

## 2019-07-04 MED ORDER — TRAZODONE HCL 50 MG PO TABS
50.0000 mg | ORAL_TABLET | Freq: Every day | ORAL | Status: DC
  Administered 2019-07-05 (×2): 50 mg via ORAL
  Filled 2019-07-04 (×3): qty 1

## 2019-07-04 MED ORDER — SPIRONOLACTONE 25 MG PO TABS: 25.0000 mg | ORAL_TABLET | Freq: Every day | ORAL | Status: DC

## 2019-07-04 MED ORDER — LINACLOTIDE 145 MCG PO CAPS
145.0000 ug | ORAL_CAPSULE | Freq: Every day | ORAL | Status: DC | PRN
  Filled 2019-07-04: qty 1

## 2019-07-04 MED ORDER — ESCITALOPRAM OXALATE 10 MG PO TABS
10.0000 mg | ORAL_TABLET | Freq: Every day | ORAL | Status: DC
  Administered 2019-07-05 – 2019-07-06 (×2): 10 mg via ORAL
  Filled 2019-07-04 (×3): qty 1

## 2019-07-04 MED ORDER — PANTOPRAZOLE SODIUM 40 MG PO TBEC
40.0000 mg | DELAYED_RELEASE_TABLET | Freq: Every day | ORAL | Status: DC
  Administered 2019-07-05 – 2019-07-06 (×2): 40 mg via ORAL
  Filled 2019-07-04 (×3): qty 1

## 2019-07-04 MED ORDER — THIAMINE HCL 100 MG PO TABS
100.0000 mg | ORAL_TABLET | Freq: Every day | ORAL | Status: DC
  Administered 2019-07-05 – 2019-07-06 (×2): 100 mg via ORAL
  Filled 2019-07-04 (×3): qty 1

## 2019-07-04 MED ORDER — LORAZEPAM 0.5 MG PO TABS
0.5000 mg | ORAL_TABLET | Freq: Four times a day (QID) | ORAL | Status: DC | PRN
  Administered 2019-07-05 (×2): 0.5 mg via ORAL
  Filled 2019-07-04 (×2): qty 1

## 2019-07-04 MED ORDER — ACETAMINOPHEN 325 MG PO TABS
650.0000 mg | ORAL_TABLET | Freq: Four times a day (QID) | ORAL | Status: DC | PRN
  Administered 2019-07-05: 09:00:00 650 mg via ORAL
  Filled 2019-07-04: qty 2

## 2019-07-04 MED ORDER — ACETAMINOPHEN 650 MG RE SUPP: 650.0000 mg | Freq: Four times a day (QID) | RECTAL | Status: DC | PRN

## 2019-07-04 MED ORDER — SODIUM CHLORIDE 0.45 % IV SOLN: INTRAVENOUS | Status: DC

## 2019-07-04 MED ORDER — FUROSEMIDE 40 MG PO TABS: 40.0000 mg | ORAL_TABLET | Freq: Two times a day (BID) | ORAL | Status: DC

## 2019-07-04 MED ORDER — SODIUM CHLORIDE 0.9 % IV BOLUS
1000.0000 mL | Freq: Once | INTRAVENOUS | Status: AC
  Administered 2019-07-04: 21:00:00 1000 mL via INTRAVENOUS

## 2019-07-04 MED ORDER — SODIUM CHLORIDE 1 G PO TABS
0.5000 g | ORAL_TABLET | Freq: Every day | ORAL | Status: DC
  Filled 2019-07-04: qty 0.5

## 2019-07-04 MED ORDER — POTASSIUM CHLORIDE CRYS ER 10 MEQ PO TBCR
10.0000 meq | EXTENDED_RELEASE_TABLET | Freq: Every day | ORAL | Status: DC
  Administered 2019-07-05 – 2019-07-06 (×2): 10 meq via ORAL
  Filled 2019-07-04 (×3): qty 1

## 2019-07-04 MED ORDER — ENOXAPARIN SODIUM 40 MG/0.4ML ~~LOC~~ SOLN
40.0000 mg | SUBCUTANEOUS | Status: DC
  Administered 2019-07-05 (×2): 40 mg via SUBCUTANEOUS
  Filled 2019-07-04 (×2): qty 0.4

## 2019-07-04 NOTE — H&P (Signed)
History and Physical    [name] KYH:[id number] DOB: 1940-06-30 DOA: 07/04/2019  PCP: Shon Baton, MD (Confirm with patient/family/NH records and if not entered, this has to be entered at [facility] point of entry) Patient coming from: home via PCP office  I have personally briefly reviewed patient's old medical records in [place]  Chief Complaint: hyponatremia  HPI: [name] is a 79 y.o. male with medical history significant of HTN, cirrhosis with h/o frequent paracentesis for ascites, constrictive pericarditis, chronic diastolic heart failure managed in HF clinic. He is not a surgical candidate for pericardectomy 2/2 advanced cirrhosis and has been managed with diuretitcs. He has had hyponatremia to a low of 119 and is chronically hyponatremic. Per HF clinic note 06/07/19 he was doing better on a reduced schedule of diuretics. His need for paracentesis has dropped with last procedure more than 8 weeks ago. At his PCP's office today he had lab revealing a sodium of 120 and was subsequently referred to [facility] He is asymptomatic  (For level 3, the HPI must include 4+ descriptors: Location, Quality, Severity, Duration, Timing, Context, modifying factors, associated signs/symptoms and/or status of 3+ chronic problems.)  (Please avoid self-populating past medical history here) (The initial 2-3 lines should be focused and good to copy and paste in the HPI section of the daily progress note).  ED Course: Hemodynamically stable in ED. Na confirmed at 120. He was given a liter of NS. He is [facility] for observation admission to complete replenishment of sodium.  Review of Systems: As per HPI otherwise 10 point review of systems negative.  Unacceptable ROS statements: "10 systems reviewed," "Extensive" (without elaboration).  Acceptable ROS statements: "All others negative," "All others reviewed and are negative," and "All others unremarkable," with at [place] documented Can't double  dip - if using for HPI can't use for ROS  Past Medical History:  Diagnosis Date  . Acute respiratory failure with hypoxia ([place]) 03/09/2019  . Adenomatous polyps 06/2004  . Agent orange exposure   . Alcoholic cirrhosis of liver with ascites ([place])   . Alcoholism ([place])   . Allergy   . Anxiety    PTSD- no meds  . Atrial fibrillation ([place])   . Atrial flutter ([place])    had ablation   . CAD in native artery    cath 04/2019: 99% stenosis of ostium of OM>> small vessel>> RX tx  . Chronic constrictive pericarditis    a. cath 04/2019 - moderate elevated L heart pressure; severely edevated R heart adn pulomary artery pressures   . Chronic kidney disease    "beginnings of kidney failure"- 3-4 years ago  . Depression   . Diabetes mellitus without complication (HCC)    no meds  . Diverticulosis   . GERD (gastroesophageal reflux disease)   . Hemorrhoids   . Hiatal hernia   . Hx of hernia repair   . Hyperlipidemia   . Hypertension   . Hyponatremia 08/2018  . Iron deficiency anemia   . Lung nodules    bilateral  . Pedal edema   . Pneumonia    06-2016  . PTSD (post-traumatic stress disorder)   . Substance abuse ([place])    alcohol use  . Ulcer   . Varicose veins     Past Surgical History:  Procedure Laterality Date  . ATRIAL ABLATION SURGERY    . COLONOSCOPY    . FUDUCIAL PLACEMENT N/A 07/26/2018   Procedure: PLACEMENT OF FUDUCIAL;  Surgeon:  [name], MD;  Location: [place];  Service: Thoracic;  Laterality: N/A;  . IR PARACENTESIS  10/28/2016  . IR PARACENTESIS  11/12/2016  . IR PARACENTESIS  12/02/2016  . IR PARACENTESIS  12/10/2016  . IR PARACENTESIS  12/30/2016  . IR PARACENTESIS  01/27/2017  . IR PARACENTESIS  09/09/2017  . IR PARACENTESIS  03/04/2018  . IR PARACENTESIS  04/08/2018  . IR PARACENTESIS  05/10/2018  . IR PARACENTESIS  05/21/2018  . IR PARACENTESIS  06/16/2018  . IR PARACENTESIS  07/23/2018  . IR PARACENTESIS  10/06/2018  . IR PARACENTESIS  10/28/2018  . IR  PARACENTESIS  01/12/2019  . IR PARACENTESIS  01/28/2019  . IR PARACENTESIS  02/04/2019  . IR PARACENTESIS  02/15/2019  . IR PARACENTESIS  02/28/2019  . IR PARACENTESIS  03/22/2019  . IR PARACENTESIS  04/04/2019  . IR PARACENTESIS  04/15/2019  . IR RADIOLOGIST EVAL & MGMT  06/22/2018  . IR THORACENTESIS ASP PLEURAL SPACE W/IMG GUIDE  03/09/2019  . IR THORACENTESIS ASP PLEURAL SPACE W/IMG GUIDE  03/10/2019  . POLYPECTOMY    . RIGHT/LEFT HEART CATH AND CORONARY ANGIOGRAPHY N/A 04/21/2019   Procedure: RIGHT/LEFT HEART CATH AND CORONARY ANGIOGRAPHY;  Surgeon: Nelva Bush, MD;  Location: [place] [facility];  Service: Cardiovascular;  Laterality: N/A;  . THORACENTESIS  03/09/2019  . TONSILLECTOMY    . UMBILICAL HERNIA REPAIR    . VARICOSE VEIN SURGERY     x4  . VIDEO BRONCHOSCOPY WITH ENDOBRONCHIAL NAVIGATION N/A 07/26/2018   Procedure: VIDEO BRONCHOSCOPY WITH ENDOBRONCHIAL NAVIGATION;  Surgeon: [name], MD;  Location: [place];  Service: Thoracic;  Laterality: N/A;   Soc Hx - [place] and came back with issues.  married and divorced x 3. He had 3 children from 1st marriage, had two children from second marriage both deceased from drug use related deaths. He worked as a Administrator, arts until Regulatory affairs officer at 89. He lives alone but daughter and son-in-law nearby. He report continued alcohol use: 2 Miceli and a glass of wine daily..   reports that he has never smoked. He has never used smokeless tobacco. He reports current alcohol use. He reports that he does not use drugs.  Allergies  Allergen Reactions  . Lisinopril Anaphylaxis and Other (See Comments)    Hyperkalemia, Dizziness   . Lipitor [Atorvastatin] Other (See Comments)    Marked leg fatigue  . Sulfonamide Derivatives Other (See Comments)    UNSPECIFIED REACTION      Family History  Problem Relation Age of Onset  . Colon polyps Brother   . Prostate cancer Brother   . Heart disease Father        rheumatic fever as  child  . Stroke Mother   . Colon cancer Neg Hx   . Esophageal cancer Neg Hx   . Rectal cancer Neg Hx   . Stomach cancer Neg Hx    Unacceptable: Noncontributory, unremarkable, or negative. Acceptable: Family history reviewed and not pertinent (If you reviewed it)  Prior to Admission medications   Medication Sig Start Date End Date Taking? Authorizing Provider  folic acid (FOLVITE) 1 MG tablet Take 1 tablet (1 mg total) by mouth daily. Patient taking differently: Take 1 mg by mouth at bedtime.  09/01/18  Yes Black, Lezlie Octave, NP  furosemide (LASIX) 80 MG tablet Take 40 mg by mouth 2 (two) times daily.   Yes [provider]  linaclotide (LINZESS) 145 MCG CAPS capsule Take 145  mcg by mouth as needed (constipation).    Yes [provider]  LORazepam (ATIVAN) 0.5 MG tablet Take 0.5 mg by mouth at bedtime as needed for sleep.  03/29/19  Yes [provider]  Multiple Vitamin (MULTIVITAMIN WITH MINERALS) TABS tablet Take 1 tablet by mouth daily. 09/01/18  Yes Black, Lezlie Octave, NP  omeprazole (PRILOSEC) 20 MG capsule Take 1 capsule (20 mg total) by mouth 2 (two) times daily. Patient taking differently: Take 20 mg by mouth daily as needed (heartburn).  03/23/13  Yes Ladene Artist, MD  potassium chloride (KLOR-CON) 10 MEQ tablet Take 2 tablets (20 mEq total) by mouth daily. Take extra 4 tabs on Mon and Friday Patient taking differently: Take 10 mEq by mouth.  05/05/19  Yes Bensimhon, Shaune Pascal, MD  Skin Protectants, Misc. (EUCERIN) cream Apply 1 application topically as needed (irritated skin).   Yes [provider]  sodium chloride (OCEAN) 0.65 % SOLN nasal spray Place 1 spray into both nostrils as needed for congestion.   Yes [provider]  sodium chloride 1 g tablet Take 0.5 tablets by mouth daily. 07/03/19  Yes [provider]  spironolactone (ALDACTONE) 50 MG tablet Take 25 mg by mouth daily.  04/04/19  Yes [provider]  thiamine 100 MG  tablet Take 1 tablet (100 mg total) by mouth daily. 09/01/18  Yes Black, Lezlie Octave, NP  escitalopram (LEXAPRO) 10 MG tablet Take 10 mg by mouth daily. 05/06/19   [provider]  Guaifenesin (MUCINEX MAXIMUM STRENGTH) 1200 MG TB12 Take 1,200 mg by mouth daily as needed (congestion).    [provider]  traZODone (DESYREL) 50 MG tablet Take 50 mg by mouth at bedtime. 06/09/19   [provider]    Physical Exam: Vitals:   07/04/19 2145 07/04/19 2200 07/04/19 2215 07/04/19 2230  BP: 130/78 121/80 107/73 (!) 146/85  Pulse: 82 78 78 81  Resp: 16 16 17 18   Temp:      TempSrc:      SpO2: 98% 100% 98% 100%  Weight:      Height:        Constitutional: NAD, calm, comfortable Vitals:   07/04/19 2145 07/04/19 2200 07/04/19 2215 07/04/19 2230  BP: 130/78 121/80 107/73 (!) 146/85  Pulse: 82 78 78 81  Resp: 16 16 17 18   Temp:      TempSrc:      SpO2: 98% 100% 98% 100%  Weight:      Height:       General:  Pleasant older man in no distress Eyes: PERRL, lids and conjunctivae normal ENMT: Mucous membranes are moist. Posterior pharynx clear of any exudate or lesions. Neck: normal, supple, no masses, no thyromegaly Respiratory: clear to auscultation bilaterally, no wheezing, no crackles. Normal respiratory effort. No accessory muscle use.  Cardiovascular: IRIR , no murmurs / rubs / gallops. No extremity edema. 2+ pedal pulses. No carotid bruits.  Abdomen: obese, no fluid wave or shifting dullness, no tenderness, no masses palpated. No hepatosplenomegaly. Bowel sounds positive.  Musculoskeletal: no clubbing / cyanosis. No joint deformity upper and lower extremities. Good ROM, no contractures. Normal muscle tone.  Skin: no rashes, lesions, ulcers. No induration Neurologic: CN 2-12 grossly intact. Sensation intact. Strength 5/5 in all 4.  Psychiatric: Normal judgment and insight. Alert and oriented x 3. Normal mood.   (Anything < 9 systems with 2 bullets each down codes to  level 1) (If patient refuses exam can't bill higher level) (Make  sure to document decubitus ulcers present on admission -- if possible -- and whether patient has chronic indwelling catheter at time of admission)  Labs on Admission: I have personally reviewed following labs and imaging studies  CBC: Recent Labs  Lab 07/04/19 1746  WBC 5.7  HGB 13.2  HCT 37.4*  MCV 87.8  PLT 511   Basic Metabolic Panel: Recent Labs  Lab 07/04/19 1746  NA 120*  K 4.0  CL 82*  CO2 25  GLUCOSE 106*  BUN 14  CREATININE 0.70  CALCIUM 9.2   GFR: Estimated Creatinine Clearance: 63.6 mL/min (by C-G formula based on SCr of 0.7 mg/dL). Liver Function Tests: Recent Labs  Lab 07/04/19 1754  AST 31  ALT 27  ALKPHOS 154*  BILITOT 2.5*  PROT 7.3  ALBUMIN 3.4*   No results for input(s): LIPASE, AMYLASE in the last 168 hours. No results for input(s): AMMONIA in the last 168 hours. Coagulation Profile: No results for input(s): INR, PROTIME in the last 168 hours. Cardiac Enzymes: No results for input(s): CKTOTAL, CKMB, CKMBINDEX, TROPONINI in the last 168 hours. BNP (last 3 results) Recent Labs    04/29/19 1450  PROBNP 1,007*   HbA1C: No results for input(s): HGBA1C in the last 72 hours. CBG: No results for input(s): GLUCAP in the last 168 hours. Lipid Profile: No results for input(s): CHOL, HDL, LDLCALC, TRIG, CHOLHDL, LDLDIRECT in the last 72 hours. Thyroid Function Tests: No results for input(s): TSH, T4TOTAL, FREET4, T3FREE, THYROIDAB in the last 72 hours. Anemia Panel: No results for input(s): VITAMINB12, FOLATE, FERRITIN, TIBC, IRON, RETICCTPCT in the last 72 hours. Urine analysis:    Component Value Date/Time   COLORURINE YELLOW 08/25/2018 1500   APPEARANCEUR CLEAR 08/25/2018 1500   LABSPEC 1.014 08/25/2018 1500   PHURINE 6.0 08/25/2018 1500   GLUCOSEU NEGATIVE 08/25/2018 1500   HGBUR NEGATIVE 08/25/2018 1500   BILIRUBINUR NEGATIVE 08/25/2018 1500   KETONESUR NEGATIVE  08/25/2018 1500   PROTEINUR 30 (A) 08/25/2018 1500   NITRITE NEGATIVE 08/25/2018 1500   LEUKOCYTESUR NEGATIVE 08/25/2018 1500    Radiological Exams on Admission: No results found.  EKG: Independently reviewed. No EKG today  Assessment/Plan Active Problems:   Hyponatremia   Essential hypertension   Atrial fibrillation (HCC)   Cirrhosis, alcoholic (HCC)   Constrictive pericarditis   Chronic diastolic CHF (congestive heart failure) ([place])  (please populate well all problems here in Problem List. (For example, if patient is on BP meds at home and you resume or decide to hold them, it is a problem that needs to be her. Same for CAD, COPD, HLD and so on)   1. Hyponatremia - a chronic problem attributed to diuretics and cirrhosis. He is asymptomatic His baseline is in the low 120's. In ED received 1 liter NS. Plan OBSERVATION admit  Continue sodium replacement with 1/2 NS at 50 cc/hr  F/u Bmet in AM  2. Cardiac - constrictive pericarditis and dHF - not decompensated. Plan Continue present meds  3. Cirrhosis - chronic problem and stable.   4. HTN - continue home meds.   DVT prophylaxis: lovenox (Lovenox/Heparin/SCD's/anticoagulated/None (if comfort care) Code Status: full code (Full/Partial (specify details) Family Communication: spoke with son. (Specify name, relationship. Do not write "discussed with patient". Specify tel # if discussed over the phone) Disposition Plan: home 24 hrs (specify when and where you expect patient to be discharged) Consults called: none (with names) Admission status: observaton (inpatient / obs / tele / medical floor / SDU)  Adella Hare MD [facility] Pager [phone]  If 7PM-7AM, please contact night-coverage [email or web address] Password [facility]  07/04/2019, 10:47 PM

## 2019-07-04 NOTE — ED Triage Notes (Signed)
Pt states he was sent from PCP due to low sodium levels. Pt has no complaints.

## 2019-07-04 NOTE — ED Provider Notes (Signed)
[facility] EMERGENCY DEPARTMENT Provider Note   CSN: [id number] Arrival date & time: 07/04/19  1722     History Chief Complaint  Patient presents with  . low sodium    [name] is a 79 y.o. male.  Patient is a 79 year old male with history of alcohol use, cirrhosis, atrial fibrillation, chronic renal insufficiency, hypertension.  He was sent here by his primary doctor for evaluation of low sodium.  Patient seems to run a chronically low sodium, however today when it was checked it was 120 which is below his baseline.  Patient denies to me he is experiencing any specific symptoms.  He does report possibly some mild weakness.  He denies any chest pain or difficulty breathing.  He denies any fevers or chills.  The history is provided by the patient.       Past Medical History:  Diagnosis Date  . Acute respiratory failure with hypoxia ([place]) 03/09/2019  . Adenomatous polyps 06/2004  . Agent orange exposure   . Alcoholic cirrhosis of liver with ascites ([place])   . Alcoholism ([place])   . Allergy   . Anxiety    PTSD- no meds  . Atrial fibrillation ([place])   . Atrial flutter ([place])    had ablation   . CAD in native artery    cath 04/2019: 99% stenosis of ostium of OM>> small vessel>> RX tx  . Chronic constrictive pericarditis    a. cath 04/2019 - moderate elevated L heart pressure; severely edevated R heart adn pulomary artery pressures   . Chronic kidney disease    "beginnings of kidney failure"- 3-4 years ago  . Depression   . Diabetes mellitus without complication (HCC)    no meds  . Diverticulosis   . GERD (gastroesophageal reflux disease)   . Hemorrhoids   . Hiatal hernia   . Hx of hernia repair   . Hyperlipidemia   . Hypertension   . Hyponatremia 08/2018  . Iron deficiency anemia   . Lung nodules    bilateral  . Pedal edema   . Pneumonia    06-2016  . PTSD (post-traumatic stress disorder)   . Substance abuse ([place])    alcohol use  . Ulcer   .  Varicose veins     Patient Active Problem List   Diagnosis Date Noted  . Acute on chronic diastolic heart failure ([place]) 05/19/2019  . Constrictive pericarditis 04/21/2019  . Acute respiratory failure with hypoxia ([place]) 03/09/2019  . Hypervolemia   . Pleural effusion on left   . SOB (shortness of breath)   . Constipation 08/28/2018  . Cirrhosis of liver with ascites ([place]) 08/26/2018  . Leukopenia 07/16/2018  . Lung nodule 07/16/2018  . Coagulopathy ([place]) 07/15/2018  . Iron deficiency anemia 02/19/2016  . Microcytic anemia 02/18/2016  . GERD (gastroesophageal reflux disease) 02/18/2016  . Orthostatic hypotension 02/17/2016  . Syncope 02/17/2016  . Hyponatremia 02/17/2016  . Varicose veins of bilateral lower extremities with other complications 69/67/8938  . Cirrhosis, alcoholic ([place]) 03/04/5101  . Ascites 10/07/2012  . Varicose veins of lower extremities with other complications 58/52/7782  . HYPERLIPIDEMIA 11/01/2008  . ETOH abuse 11/01/2008  . Essential hypertension 11/01/2008  . Atrial fibrillation ([place]) 11/01/2008  . Atrial flutter ([place]) 11/01/2008  . HEMORRHOIDS 11/01/2008  . ISCHEMIA 11/01/2008  . HERNIA, UMBILICAL 42/35/3614  . DIVERTICULOSIS OF COLON 11/01/2008  . GERD 12/16/2007  . COLONIC POLYPS, ADENOMATOUS, HX OF 12/16/2007    Past Surgical History:  Procedure  Laterality Date  . ATRIAL ABLATION SURGERY    . COLONOSCOPY    . FUDUCIAL PLACEMENT N/A 07/26/2018   Procedure: PLACEMENT OF FUDUCIAL;  Surgeon: [name], MD;  Location: [facility];  Service: Thoracic;  Laterality: N/A;  . IR PARACENTESIS  10/28/2016  . IR PARACENTESIS  11/12/2016  . IR PARACENTESIS  12/02/2016  . IR PARACENTESIS  12/10/2016  . IR PARACENTESIS  12/30/2016  . IR PARACENTESIS  01/27/2017  . IR PARACENTESIS  09/09/2017  . IR PARACENTESIS  03/04/2018  . IR PARACENTESIS  04/08/2018  . IR PARACENTESIS  05/10/2018  . IR PARACENTESIS  05/21/2018  . IR PARACENTESIS  06/16/2018  . IR  PARACENTESIS  07/23/2018  . IR PARACENTESIS  10/06/2018  . IR PARACENTESIS  10/28/2018  . IR PARACENTESIS  01/12/2019  . IR PARACENTESIS  01/28/2019  . IR PARACENTESIS  02/04/2019  . IR PARACENTESIS  02/15/2019  . IR PARACENTESIS  02/28/2019  . IR PARACENTESIS  03/22/2019  . IR PARACENTESIS  04/04/2019  . IR PARACENTESIS  04/15/2019  . IR RADIOLOGIST EVAL & MGMT  06/22/2018  . IR THORACENTESIS ASP PLEURAL SPACE W/IMG GUIDE  03/09/2019  . IR THORACENTESIS ASP PLEURAL SPACE W/IMG GUIDE  03/10/2019  . POLYPECTOMY    . RIGHT/LEFT HEART CATH AND CORONARY ANGIOGRAPHY N/A 04/21/2019   Procedure: RIGHT/LEFT HEART CATH AND CORONARY ANGIOGRAPHY;  Surgeon: Nelva Bush, MD;  Location: [facility];  Service: Cardiovascular;  Laterality: N/A;  . THORACENTESIS  03/09/2019  . TONSILLECTOMY    . UMBILICAL HERNIA REPAIR    . VARICOSE VEIN SURGERY     x4  . VIDEO BRONCHOSCOPY WITH ENDOBRONCHIAL NAVIGATION N/A 07/26/2018   Procedure: VIDEO BRONCHOSCOPY WITH ENDOBRONCHIAL NAVIGATION;  Surgeon: [name], MD;  Location: [facility];  Service: Thoracic;  Laterality: N/A;       Family History  Problem Relation Age of Onset  . Colon polyps Brother   . Prostate cancer Brother   . Heart disease Father        rheumatic fever as child  . Stroke Mother   . Colon cancer Neg Hx   . Esophageal cancer Neg Hx   . Rectal cancer Neg Hx   . Stomach cancer Neg Hx     Social History   Tobacco Use  . Smoking status: Never Smoker  . Smokeless tobacco: Never Used  Substance Use Topics  . Alcohol use: Yes    Comment: occasaional beer  . Drug use: No    Home Medications Prior to Admission medications   Medication Sig Start Date End Date Taking? Authorizing Provider  escitalopram (LEXAPRO) 10 MG tablet Take 10 mg by mouth daily. 05/06/19   [provider]  folic acid (FOLVITE) 1 MG tablet Take 1 tablet (1 mg total) by mouth daily. 09/01/18   Black, Lezlie Octave, NP  furosemide (LASIX) 80 MG tablet  Take 40 mg by mouth 2 (two) times daily.    [provider]  Guaifenesin (MUCINEX MAXIMUM STRENGTH) 1200 MG TB12 Take 1,200 mg by mouth daily as needed (congestion).    [provider]  linaclotide (LINZESS) 145 MCG CAPS capsule Take 145 mcg by mouth daily as needed (constipation).    [provider]  LORazepam (ATIVAN) 0.5 MG tablet Take 0.5 mg by mouth at bedtime as needed for sleep.  03/29/19   [provider]  Multiple Vitamin (MULTIVITAMIN WITH MINERALS) TABS tablet Take 1 tablet by mouth daily. 09/01/18   Black, Santiago Glad  M, NP  omeprazole (PRILOSEC) 20 MG capsule Take 1 capsule (20 mg total) by mouth 2 (two) times daily. 03/23/13   Ladene Artist, MD  potassium chloride (KLOR-CON) 10 MEQ tablet Take 2 tablets (20 mEq total) by mouth daily. Take extra 4 tabs on Mon and Friday 05/05/19   Bensimhon, Shaune Pascal, MD  Skin Protectants, Misc. (EUCERIN) cream Apply 1 application topically as needed (irritated skin).    [provider]  sodium chloride (OCEAN) 0.65 % SOLN nasal spray Place 1 spray into both nostrils as needed for congestion.    [provider]  sodium chloride 1 g tablet Take 0.5 tablets by mouth daily. 07/03/19   [provider]  spironolactone (ALDACTONE) 50 MG tablet Take 25 mg by mouth 2 (two) times daily.  04/04/19   [provider]  thiamine 100 MG tablet Take 1 tablet (100 mg total) by mouth daily. 09/01/18   Black, Lezlie Octave, NP  traZODone (DESYREL) 50 MG tablet Take 50 mg by mouth at bedtime. 06/09/19   [provider]    Allergies    Lisinopril, Lipitor [atorvastatin], and Sulfonamide derivatives  Review of Systems   Review of Systems  All other systems reviewed and are negative.   Physical Exam Updated Vital Signs BP (!) 137/91   Pulse 76   Temp 98.1 F (36.7 C) (Oral)   Resp 17   Ht 5\' 7"  (1.702 m)   Wt 57.2 kg   SpO2 100%   BMI 19.73 kg/m   Physical Exam Vitals and nursing note  reviewed.  Constitutional:      General: He is not in acute distress.    Appearance: He is well-developed. He is not diaphoretic.  HENT:     Head: Normocephalic and atraumatic.  Cardiovascular:     Rate and Rhythm: Normal rate and regular rhythm.     Heart sounds: No murmur. No friction rub.  Pulmonary:     Effort: Pulmonary effort is normal. No respiratory distress.     Breath sounds: Normal breath sounds. No wheezing or rales.  Abdominal:     General: Bowel sounds are normal. There is no distension.     Palpations: Abdomen is soft.     Tenderness: There is no abdominal tenderness.  Musculoskeletal:        General: Normal range of motion.     Cervical back: Normal range of motion and neck supple.  Skin:    General: Skin is warm and dry.  Neurological:     Mental Status: He is alert and oriented to person, place, and time.     Coordination: Coordination normal.     ED Results / Procedures / Treatments   Labs (all labs ordered are listed, but only abnormal results are displayed) Labs Reviewed  CBC - Abnormal; Notable for the following components:      Result Value   HCT 37.4 (*)    All other components within normal limits  BASIC METABOLIC PANEL - Abnormal; Notable for the following components:   Sodium 120 (*)    Chloride 82 (*)    Glucose, Bld 106 (*)    All other components within normal limits  SARS CORONAVIRUS 2 (TAT 6-24 HRS)  HEPATIC FUNCTION PANEL    EKG None  Radiology No results found.  Procedures Procedures (including critical care time)  Medications Ordered in ED Medications  sodium chloride 0.9 % bolus 1,000 mL (has no administration in time range)    ED Course  I have reviewed the triage vital signs and the nursing notes.  Pertinent labs & imaging results that were available during my care of the patient were reviewed by me and considered in my medical decision making (see chart for details).    MDM Rules/Calculators/A&P  Patient is a  79 year old male with past medical history as described in the HPI.  He presents today for evaluation of low sodium levels.  Patient was seen by his primary doctor and his sodium level was found to be 120.  He was noted told to come to the ER for evaluation.  Patient has minimal if any symptoms.  His physical examination is basically unremarkable and laboratory studies are unremarkable with the exception of his sodium of 120.  Patient given normal saline and will be admitted to the hospitalist service for further care.  I spoke with Dr. Linda Hedges who agrees to admit.  Final Clinical Impression(s) / ED Diagnoses Final diagnoses:  None    Rx / DC Orders ED Discharge Orders    None       Veryl Speak, MD 07/04/19 2232

## 2019-07-05 ENCOUNTER — Encounter (HOSPITAL_COMMUNITY): Payer: Self-pay | Admitting: Internal Medicine

## 2019-07-05 ENCOUNTER — Observation Stay (HOSPITAL_COMMUNITY): Payer: Medicare HMO

## 2019-07-05 DIAGNOSIS — K7031 Alcoholic cirrhosis of liver with ascites: Secondary | ICD-10-CM | POA: Diagnosis not present

## 2019-07-05 DIAGNOSIS — I5032 Chronic diastolic (congestive) heart failure: Secondary | ICD-10-CM | POA: Diagnosis not present

## 2019-07-05 DIAGNOSIS — J90 Pleural effusion, not elsewhere classified: Secondary | ICD-10-CM | POA: Diagnosis not present

## 2019-07-05 DIAGNOSIS — E871 Hypo-osmolality and hyponatremia: Secondary | ICD-10-CM | POA: Diagnosis not present

## 2019-07-05 LAB — BASIC METABOLIC PANEL
Anion gap: 11 (ref 5–15)
Anion gap: 11 (ref 5–15)
BUN: 13 mg/dL (ref 8–23)
BUN: 13 mg/dL (ref 8–23)
CO2: 22 mmol/L (ref 22–32)
CO2: 26 mmol/L (ref 22–32)
Calcium: 8.5 mg/dL — ABNORMAL LOW (ref 8.9–10.3)
Calcium: 8.7 mg/dL — ABNORMAL LOW (ref 8.9–10.3)
Chloride: 84 mmol/L — ABNORMAL LOW (ref 98–111)
Chloride: 86 mmol/L — ABNORMAL LOW (ref 98–111)
Creatinine, Ser: 0.67 mg/dL (ref 0.61–1.24)
Creatinine, Ser: 0.83 mg/dL (ref 0.61–1.24)
GFR calc Af Amer: >60 mL/min (ref >60)
GFR calc Af Amer: >60 mL/min (ref >60)
GFR calc non Af Amer: >60 mL/min (ref >60)
GFR calc non Af Amer: >60 mL/min (ref >60)
Glucose, Bld: 121 mg/dL — ABNORMAL HIGH (ref 70–99)
Glucose, Bld: 78 mg/dL (ref 70–99)
Potassium: 3.6 mmol/L (ref 3.5–5.1)
Potassium: 4.1 mmol/L (ref 3.5–5.1)
Sodium: 117 mmol/L — CL (ref 135–145)
Sodium: 123 mmol/L — ABNORMAL LOW (ref 135–145)

## 2019-07-05 LAB — RENAL FUNCTION PANEL
Albumin: 2.9 g/dL — ABNORMAL LOW (ref 3.5–5.0)
Anion gap: 10 (ref 5–15)
BUN: 12 mg/dL (ref 8–23)
CO2: 24 mmol/L (ref 22–32)
Calcium: 8.5 mg/dL — ABNORMAL LOW (ref 8.9–10.3)
Chloride: 86 mmol/L — ABNORMAL LOW (ref 98–111)
Creatinine, Ser: 0.78 mg/dL (ref 0.61–1.24)
GFR calc Af Amer: >60 mL/min (ref >60)
GFR calc non Af Amer: >60 mL/min (ref >60)
Glucose, Bld: 124 mg/dL — ABNORMAL HIGH (ref 70–99)
Phosphorus: 3 mg/dL (ref 2.5–4.6)
Potassium: 4.2 mmol/L (ref 3.5–5.1)
Sodium: 120 mmol/L — ABNORMAL LOW (ref 135–145)

## 2019-07-05 LAB — OSMOLALITY
Osmolality: 252 mOsm/kg — ABNORMAL LOW (ref 275–295)
Osmolality: 257 mOsm/kg — ABNORMAL LOW (ref 275–295)

## 2019-07-05 LAB — SODIUM, URINE, RANDOM: Sodium, Ur: 29 mmol/L

## 2019-07-05 LAB — SARS CORONAVIRUS 2 (TAT 6-24 HRS): SARS Coronavirus 2: NEGATIVE

## 2019-07-05 LAB — CREATININE, URINE, RANDOM: Creatinine, Urine: 51.81 mg/dL

## 2019-07-05 LAB — URIC ACID: Uric Acid, Serum: 6 mg/dL (ref 3.7–8.6)

## 2019-07-05 LAB — CORTISOL: Cortisol, Plasma: 19.6 ug/dL

## 2019-07-05 LAB — TSH: TSH: 0.925 u[IU]/mL (ref 0.350–4.500)

## 2019-07-05 LAB — OSMOLALITY, URINE: Osmolality, Ur: 327 mOsm/kg (ref 300–900)

## 2019-07-05 MED ORDER — SODIUM CHLORIDE 1 G PO TABS
2.0000 g | ORAL_TABLET | Freq: Two times a day (BID) | ORAL | Status: DC
  Administered 2019-07-05 – 2019-07-06 (×3): 2 g via ORAL
  Filled 2019-07-05 (×4): qty 2

## 2019-07-05 NOTE — Consult Note (Deleted)
Reason for Consult: Hyponatremia Referring Physician: Dyan Creelman is an 79 y.o. male.   HPI: Patient is a 79 yo caucasian male with history of non-small cell lung cancer (feb 1062), alcoholic cirrhosis, hypertension, diastolic heart failure, and chronic hyponatremia, who presented with sodium of 120 from PCP office. Patient denies any recent medication/dose changes to diuretics or SSRIs. Patient is asymptomatic, denies fatigue, dizziness, gait changes from baseline, lethargy, headache, attention changes, seizure, vision changes, dyspnea, shortness of breath, polydipsia, nausea, emesis, and diarrhea.  Patient endorses drinking 2-3 Alfonzo at night to help with sleep. He using Linaclotide 2-3x weekly for constipation with good effect. Patient also endorses minimal baseline swelling in lower extremities and requires walker for short distances.   Trend in Creatinine: Sodium  Date/Time Value Ref Range Status  07/05/2019 02:04 AM 117 (LL) 135 - 145 mmol/L Final    Comment:    CRITICAL RESULT CALLED TO, READ BACK BY AND VERIFIED WITH: CROSS T,RN 07/05/19 0310 WAYK   07/04/2019 05:46 PM 120 (L) 135 - 145 mmol/L Final  06/07/2019 10:32 AM 123 (L) 135 - 145 mmol/L Final  05/20/2019 07:01 AM 126 (L) 135 - 145 mmol/L Final    Comment:    Performed at [facility], [place] [address] [place], [place] [address]  05/20/2019 01:57 AM 126 (L) 135 - 145 mmol/L Final  05/19/2019 08:06 PM 126 (L) 135 - 145 mmol/L Final    Comment:    Performed at [facility], [place] [address] [place], [place] [address]  05/19/2019 01:44 PM 127 (L) 135 - 145 mmol/L Final    Comment:    Performed at [place] [address], [place], [place] [address]  05/19/2019 07:35 AM 126 (L) 135 - 145 mmol/L Final  05/19/2019 02:37 AM 125 (L) 135 - 145 mmol/L Final    Comment:    Performed at [facility], [place] [address] [place], [place] [address]  05/18/2019 11:24 PM 122 (L) 135 - 145 mmol/L Final     Comment:    Performed at [facility] [address] [place], [place] [address]  05/18/2019 06:11 PM 119 (LL) 135 - 145 mmol/L Final    Comment:    CRITICAL RESULT CALLED TO, READ BACK BY AND VERIFIED WITH: Leron Croak RN AT 6967 ON [id number] BY K FORSYTH   05/18/2019 04:26 AM 119 (LL) 135 - 145 mmol/L Final    Comment:    CRITICAL RESULT CALLED TO, READ BACK BY AND VERIFIED WITH: MEEKS R,RN 05/18/19 0522 WAYK   05/17/2019 03:09 PM 121 (L) 135 - 145 mmol/L Final  04/29/2019 02:50 PM 128 (L) 134 - 144 mmol/L Final  04/21/2019 10:22 AM 127 (L) 135 - 145 mmol/L Final  04/21/2019 10:19 AM 129 (L) 135 - 145 mmol/L Final  04/21/2019 07:50 AM 128 (L) 135 - 145 mmol/L Final  04/18/2019 08:29 AM 126 (L) 134 - 144 mmol/L Final  03/11/2019 02:40 AM 124 (L) 135 - 145 mmol/L Final  03/10/2019 02:52 AM 126 (L) 135 - 145 mmol/L Final  03/09/2019 07:09 AM 129 (L) 135 - 145 mmol/L Final  03/08/2019 08:10 PM 128 (L) 135 - 145 mmol/L Final  08/31/2018 03:52 AM 124 (L) 135 - 145 mmol/L Final  08/30/2018 08:21 PM 119 (LL) 135 - 145 mmol/L Final    Comment:    CRITICAL RESULT CALLED TO, READ BACK BY AND VERIFIED WITH: PITTMAN [facility] 08/30/10 2108 WAYK Performed  at [facility], [place] [address] [place], [place] [address]   08/30/2018 01:09 PM 120 (L) 135 - 145 mmol/L Final    Comment:    Performed at [facility] [address] [place], [place] [address]  08/30/2018 04:42 AM 120 (L) 135 - 145 mmol/L Final  08/29/2018 04:14 PM 121 (L) 135 - 145 mmol/L Final  08/29/2018 03:51 AM 118 (LL) 135 - 145 mmol/L Final    Comment:    CRITICAL RESULT CALLED TO, READ BACK BY AND VERIFIED WITH: OVERBY M,RN 08/29/18 0450 WAYK   08/28/2018 04:09 AM 118 (LL) 135 - 145 mmol/L Final    Comment:    CRITICAL RESULT CALLED TO, READ BACK BY AND VERIFIED WITH: MASLENJAK I,RN 08/28/18 0548 WAYK   08/27/2018 06:14 PM 120 (L) 135 - 145 mmol/L Final  08/27/2018 02:25 AM 116 (LL) 135 - 145 mmol/L Final     Comment:    CRITICAL RESULT CALLED TO, READ BACK BY AND VERIFIED WITH: WOOD J,RN 08/27/18 0353 WAYK   08/26/2018 07:39 PM 117 (LL) 135 - 145 mmol/L Final    Comment:    CRITICAL RESULT CALLED TO, READ BACK BY AND VERIFIED WITH: J CARSON,RN 2017 08/26/2018 WBOND   08/26/2018 07:14 AM 117 (LL) 135 - 145 mmol/L Final    Comment:    CRITICAL RESULT CALLED TO, READ BACK BY AND VERIFIED WITH: MILAM,T RN @0837  ON [id number] BY FLEMINGS   08/26/2018 04:32 AM 119 (LL) 135 - 145 mmol/L Final    Comment:    CRITICAL RESULT CALLED TO, READ BACK BY AND VERIFIED WITH: COBLE K,RN 08/26/18 0531 WAYK   08/25/2018 11:35 PM 117 (LL) 135 - 145 mmol/L Final    Comment:    CRITICAL RESULT CALLED TO, READ BACK BY AND VERIFIED WITH: BAILEY K,RN 08/26/18 0015 WAYK   08/25/2018 08:03 PM 116 (LL) 135 - 145 mmol/L Final    Comment:    CRITICAL RESULT CALLED TO, READ BACK BY AND VERIFIED WITH: BAILEY C,RN 08/25/18 2057 WAYK   08/25/2018 04:18 PM 117 (LL) 135 - 145 mmol/L Final    Comment:    CRITICAL RESULT CALLED TO, READ BACK BY AND VERIFIED WITH: I JACKSON,RN 1700 08/25/2018 D BRADLEY   08/25/2018 01:16 PM 116 (LL) 135 - 145 mmol/L Final    Comment:    CRITICAL RESULT CALLED TO, READ BACK BY AND VERIFIED WITH: HEATH,M RN @ 6967 08/25/18 LEONARD,A   07/26/2018 08:24 AM 125 (L) 135 - 145 mmol/L Final  07/16/2018 07:52 AM 126 (L) 135 - 145 mmol/L Final  07/06/2018 02:30 PM 131 (L) 135 - 145 mmol/L Final  07/02/2018 11:04 AM 129 (L) 135 - 145 mEq/L Final  06/22/2018 11:58 AM 127 (L) 135 - 145 mEq/L Final  02/19/2016 01:58 AM 125 (L) 135 - 145 mmol/L Final  02/18/2016 04:11 AM 127 (L) 135 - 145 mmol/L Final  02/17/2016 12:50 PM 124 (L) 135 - 145 mmol/L Final  02/17/2016 02:16 AM 124 (L) 135 - 145 mmol/L Final  02/16/2013 02:53 PM 132 (L) 135 - 145 mEq/L Final  12/01/2012 10:52 AM 134 (L) 135 - 145 mEq/L Final  10/09/2012 05:49 AM 131 (L) 135 - 145 mEq/L Final  10/08/2012 01:07 PM 131 (L) 135 - 145  mEq/L Final  10/08/2012 05:10 AM 129 (L) 135 - 145 mEq/L Final  10/07/2012 08:50 PM 129 (L) 135 - 145 mEq/L Final  10/07/2012 04:55 PM 129 (L) 135 - 145 mEq/L Final  Creatinine  Date/Time Value Ref Range Status  01/20/2019 09:45 AM 0.86 0.61 - 1.24 mg/dL Final  10/19/2018 10:29 AM 1.01 0.61 - 1.24 mg/dL Final  07/16/2018 07:52 AM 1.15 0.61 - 1.24 mg/dL Final   Creatinine, Ser  Date/Time Value Ref Range Status  07/05/2019 02:04 AM 0.67 0.61 - 1.24 mg/dL Final  07/04/2019 05:46 PM 0.70 0.61 - 1.24 mg/dL Final  06/07/2019 10:32 AM 0.80 0.61 - 1.24 mg/dL Final  05/20/2019 01:57 AM 0.78 0.61 - 1.24 mg/dL Final  05/19/2019 07:35 AM 0.73 0.61 - 1.24 mg/dL Final  05/18/2019 06:11 PM 0.76 0.61 - 1.24 mg/dL Final  05/18/2019 04:26 AM 0.70 0.61 - 1.24 mg/dL Final  05/17/2019 03:09 PM 0.73 0.61 - 1.24 mg/dL Final  04/29/2019 02:50 PM 0.75 (L) 0.76 - 1.27 mg/dL Final  04/21/2019 07:50 AM 0.75 0.61 - 1.24 mg/dL Final  04/18/2019 08:29 AM 0.72 (L) 0.76 - 1.27 mg/dL Final  03/11/2019 02:40 AM 0.67 0.61 - 1.24 mg/dL Final  03/10/2019 02:52 AM 0.77 0.61 - 1.24 mg/dL Final  03/09/2019 07:09 AM 0.79 0.61 - 1.24 mg/dL Final  03/08/2019 08:10 PM 0.96 0.61 - 1.24 mg/dL Final  08/31/2018 03:52 AM 1.27 (H) 0.61 - 1.24 mg/dL Final  08/30/2018 04:42 AM 1.17 0.61 - 1.24 mg/dL Final  08/29/2018 04:14 PM 0.89 0.61 - 1.24 mg/dL Final  08/29/2018 03:51 AM 0.96 0.61 - 1.24 mg/dL Final  08/28/2018 04:09 AM 0.82 0.61 - 1.24 mg/dL Final  08/27/2018 06:14 PM 1.02 0.61 - 1.24 mg/dL Final  08/27/2018 02:25 AM 0.95 0.61 - 1.24 mg/dL Final  08/26/2018 07:39 PM 1.07 0.61 - 1.24 mg/dL Final  08/26/2018 07:14 AM 0.92 0.61 - 1.24 mg/dL Final  08/26/2018 04:32 AM 0.99 0.61 - 1.24 mg/dL Final  08/25/2018 11:35 PM 1.16 0.61 - 1.24 mg/dL Final  08/25/2018 08:03 PM 1.19 0.61 - 1.24 mg/dL Final  08/25/2018 04:18 PM 1.12 0.61 - 1.24 mg/dL Final  08/25/2018 01:16 PM 1.26 (H) 0.61 - 1.24 mg/dL Final  07/26/2018 08:24 AM 1.15  0.61 - 1.24 mg/dL Final  07/06/2018 02:30 PM 1.06 0.61 - 1.24 mg/dL Final  07/02/2018 11:04 AM 1.02 0.40 - 1.50 mg/dL Final  06/22/2018 11:58 AM 1.26 0.40 - 1.50 mg/dL Final  02/19/2016 01:58 AM 1.00 0.61 - 1.24 mg/dL Final  02/18/2016 04:11 AM 0.98 0.61 - 1.24 mg/dL Final  02/17/2016 12:50 PM 1.07 0.61 - 1.24 mg/dL Final  02/17/2016 02:16 AM 1.08 0.61 - 1.24 mg/dL Final  02/16/2013 02:53 PM 1.1 0.4 - 1.5 mg/dL Final  12/01/2012 10:52 AM 1.0 0.4 - 1.5 mg/dL Final  10/09/2012 05:49 AM 1.26 0.50 - 1.35 mg/dL Final  10/08/2012 01:07 PM 1.38 (H) 0.50 - 1.35 mg/dL Final  10/08/2012 05:10 AM 1.50 (H) 0.50 - 1.35 mg/dL Final  10/07/2012 08:50 PM 1.90 (H) 0.50 - 1.35 mg/dL Final  10/07/2012 04:55 PM 2.30 (H) 0.50 - 1.35 mg/dL Final  10/07/2012 04:46 PM 2.20 (H) 0.50 - 1.35 mg/dL Final  08/21/2010 04:15 PM 0.7 0.4 - 1.5 mg/dL Final  11/23/2006 10:01 AM 0.8 0.4 - 1.5 mg/dL Final  09/14/2006 11:56 AM 0.7 0.4 - 1.5 mg/dL Final   PMH:   Past Medical History:  Diagnosis Date  . Acute respiratory failure with hypoxia ([facility]) 03/09/2019  . Adenomatous polyps 06/2004  . Agent orange exposure   . Alcoholic cirrhosis of liver with ascites ([place])   . Alcoholism ([place])   . Allergy   . Anxiety    PTSD- no meds  . Atrial fibrillation (  [place])   . Atrial flutter ([place])    had ablation   . CAD in native artery    cath 04/2019: 99% stenosis of ostium of OM>> small vessel>> RX tx  . Chronic constrictive pericarditis    a. cath 04/2019 - moderate elevated L heart pressure; severely edevated R heart adn pulomary artery pressures   . Chronic kidney disease    "beginnings of kidney failure"- 3-4 years ago  . Depression   . Diabetes mellitus without complication ([facility])    no meds  . Diverticulosis   . GERD (gastroesophageal reflux disease)   . Hemorrhoids   . Hiatal hernia   . HOH (hard of hearing)   . Hx of hernia repair   . Hyperlipidemia   . Hypertension   . Hyponatremia 08/2018  . Hyponatremia 06/2019   . Iron deficiency anemia   . Lung nodules    bilateral  . Pedal edema   . Pneumonia    06-2016  . PTSD (post-traumatic stress disorder)   . Substance abuse ([place])    alcohol use  . Ulcer   . Varicose veins     PSH:   Past Surgical History:  Procedure Laterality Date  . ATRIAL ABLATION SURGERY    . COLONOSCOPY    . FUDUCIAL PLACEMENT N/A 07/26/2018   Procedure: PLACEMENT OF FUDUCIAL;  Surgeon: [name], MD;  Location: [facility];  Service: Thoracic;  Laterality: N/A;  . IR PARACENTESIS  10/28/2016  . IR PARACENTESIS  11/12/2016  . IR PARACENTESIS  12/02/2016  . IR PARACENTESIS  12/10/2016  . IR PARACENTESIS  12/30/2016  . IR PARACENTESIS  01/27/2017  . IR PARACENTESIS  09/09/2017  . IR PARACENTESIS  03/04/2018  . IR PARACENTESIS  04/08/2018  . IR PARACENTESIS  05/10/2018  . IR PARACENTESIS  05/21/2018  . IR PARACENTESIS  06/16/2018  . IR PARACENTESIS  07/23/2018  . IR PARACENTESIS  10/06/2018  . IR PARACENTESIS  10/28/2018  . IR PARACENTESIS  01/12/2019  . IR PARACENTESIS  01/28/2019  . IR PARACENTESIS  02/04/2019  . IR PARACENTESIS  02/15/2019  . IR PARACENTESIS  02/28/2019  . IR PARACENTESIS  03/22/2019  . IR PARACENTESIS  04/04/2019  . IR PARACENTESIS  04/15/2019  . IR RADIOLOGIST EVAL & MGMT  06/22/2018  . IR THORACENTESIS ASP PLEURAL SPACE W/IMG GUIDE  03/09/2019  . IR THORACENTESIS ASP PLEURAL SPACE W/IMG GUIDE  03/10/2019  . POLYPECTOMY    . RIGHT/LEFT HEART CATH AND CORONARY ANGIOGRAPHY N/A 04/21/2019   Procedure: RIGHT/LEFT HEART CATH AND CORONARY ANGIOGRAPHY;  Surgeon: Nelva Bush, MD;  Location: [facility];  Service: Cardiovascular;  Laterality: N/A;  . THORACENTESIS  03/09/2019  . TONSILLECTOMY    . UMBILICAL HERNIA REPAIR    . VARICOSE VEIN SURGERY     x4  . VIDEO BRONCHOSCOPY WITH ENDOBRONCHIAL NAVIGATION N/A 07/26/2018   Procedure: VIDEO BRONCHOSCOPY WITH ENDOBRONCHIAL NAVIGATION;  Surgeon: [name], MD;  Location: [facility];  Service:  Thoracic;  Laterality: N/A;    Allergies:  Allergies  Allergen Reactions  . Lisinopril Anaphylaxis and Other (See Comments)    Hyperkalemia, Dizziness   . Lipitor [Atorvastatin] Other (See Comments)    Marked leg fatigue  . Sulfonamide Derivatives Other (See Comments)    UNSPECIFIED REACTION      Medications:   Prior to Admission medications   Medication Sig Start Date End Date Taking? Authorizing Provider  folic acid (FOLVITE) 1 MG tablet Take 1 tablet (1  mg total) by mouth daily. Patient taking differently: Take 1 mg by mouth at bedtime.  09/01/18  Yes Black, Lezlie Octave, NP  furosemide (LASIX) 80 MG tablet Take 40 mg by mouth 2 (two) times daily.   Yes [provider]  linaclotide (LINZESS) 145 MCG CAPS capsule Take 145 mcg by mouth as needed (constipation).    Yes [provider]  LORazepam (ATIVAN) 0.5 MG tablet Take 0.5 mg by mouth at bedtime as needed for sleep.  03/29/19  Yes [provider]  Multiple Vitamin (MULTIVITAMIN WITH MINERALS) TABS tablet Take 1 tablet by mouth daily. 09/01/18  Yes Black, Lezlie Octave, NP  omeprazole (PRILOSEC) 20 MG capsule Take 1 capsule (20 mg total) by mouth 2 (two) times daily. Patient taking differently: Take 20 mg by mouth daily as needed (heartburn).  03/23/13  Yes Ladene Artist, MD  potassium chloride (KLOR-CON) 10 MEQ tablet Take 2 tablets (20 mEq total) by mouth daily. Take extra 4 tabs on Mon and Friday Patient taking differently: Take 10 mEq by mouth.  05/05/19  Yes Bensimhon, Shaune Pascal, MD  Skin Protectants, Misc. (EUCERIN) cream Apply 1 application topically as needed (irritated skin).   Yes [provider]  sodium chloride (OCEAN) 0.65 % SOLN nasal spray Place 1 spray into both nostrils as needed for congestion.   Yes [provider]  sodium chloride 1 g tablet Take 0.5 tablets by mouth daily. 07/03/19  Yes [provider]  spironolactone (ALDACTONE) 50 MG tablet Take 25 mg by mouth daily.   04/04/19  Yes [provider]  thiamine 100 MG tablet Take 1 tablet (100 mg total) by mouth daily. 09/01/18  Yes Black, Lezlie Octave, NP  escitalopram (LEXAPRO) 10 MG tablet Take 10 mg by mouth daily. 05/06/19   [provider]  Guaifenesin (MUCINEX MAXIMUM STRENGTH) 1200 MG TB12 Take 1,200 mg by mouth daily as needed (congestion).    [provider]  traZODone (DESYREL) 50 MG tablet Take 50 mg by mouth at bedtime. 06/09/19   [provider]    Discontinued Meds:   Medications Discontinued During This Encounter  Medication Reason  . 0.45 % sodium chloride infusion   . furosemide (LASIX) tablet 40 mg   . spironolactone (ALDACTONE) tablet 25 mg   . sodium chloride tablet 0.5 g     Social History:  reports that he has never smoked. He has never used smokeless tobacco. He reports current alcohol use. He reports that he does not use drugs.  Family History:   Family History  Problem Relation Age of Onset  . Colon polyps Brother   . Prostate cancer Brother   . Heart disease Father        rheumatic fever as child  . Stroke Mother   . Colon cancer Neg Hx   . Esophageal cancer Neg Hx   . Rectal cancer Neg Hx   . Stomach cancer Neg Hx     Pertinent items are noted in HPI.  Blood pressure 106/75, pulse 67, temperature 97.6 F (36.4 C), temperature source Oral, resp. rate 18, height 5\' 6"  (1.676 m), weight 70.7 kg, SpO2 98 %. General appearance: alert, cooperative, appears stated age and no distress Neck: no JVD and supple, symmetrical, trachea midline Back: No edema appreciated Resp: clear to auscultation bilaterally Cardio: regular rate and rhythm, S1, S2 normal, no murmur, click, rub or gallop GI: normal findings: bowel sounds normal and soft, non-tender Extremities: no pitting edema in lower extremeities appreciated  Labs: Basic Metabolic Panel: Recent Labs  Lab 07/04/19 1746 07/04/19 1754 07/05/19 0204  NA 120*  --  117*  K 4.0  --  3.6  CL  82*  --  84*  CO2 25  --  22  GLUCOSE 106*  --  121*  BUN 14  --  13  CREATININE 0.70  --  0.67  ALBUMIN  --  3.4*  --   CALCIUM 9.2  --  8.5*   Liver Function Tests: Recent Labs  Lab 07/04/19 1754  AST 31  ALT 27  ALKPHOS 154*  BILITOT 2.5*  PROT 7.3  ALBUMIN 3.4*   No results for input(s): LIPASE, AMYLASE in the last 168 hours. No results for input(s): AMMONIA in the last 168 hours. CBC: Recent Labs  Lab 07/04/19 1746  WBC 5.7  HGB 13.2  HCT 37.4*  MCV 87.8  PLT 207   PT/INR: @labrcntip (inr:5) Cardiac Enzymes: No results for input(s): CKTOTAL, CKMB, CKMBINDEX, TROPONINI in the last 168 hours. CBG: No results for input(s): GLUCAP in the last 168 hours.  Iron Studies: No results for input(s): IRON, TIBC, TRANSFERRIN, FERRITIN in the last 168 hours.  Xrays/Other Studies: No results found.   Assessment/Plan: 1. Acute on Chronic Hyponatremia, long term SSRI use likely cause of chronic hyponatremia. Discontinue home lasix and spironolactone acutely. Sodium 120 on admission with 1046mL NS bolus given with no effect. Sodium this morning down to 118. Discontinue 1/2 NS, as this will exacerbate hyponatremia. Serum Osm, Urine Osm and Urine Na pending. Sodium chloride tablets 2x daily with meals. 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 Awaiting afternoon labs, urine studies, orthostatic vital signs, and CXR.   2. Hypertension --hold diuretics due to hyponatremia and BP 106/75. Per primary  3. Cirrhosis --hold diuretics due to hyponatremia. Per primary.   Sharon Mt, PA STUDENT  07/05/2019, 9:58 AM
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
## 2019-07-05 NOTE — Progress Notes (Signed)
PROGRESS NOTE    [name]  LSL:[id number] DOB: 1941-01-19 DOA: 07/04/2019 PCP: Shon Baton, MD   Brief Narrative: Patient is a 79 year old male with history of hypertension, alcohol induced liver cirrhosis, ascites requiring frequent paracentesis, constrictive pericarditis, chronic diastolic heart failure following at heart failure clinic, hypertension, atrial flutter status post ablation who was sent from his PCPs office for the evaluation of hyponatremia.  Patient has history of chronic hyponatremia.  His last admission with similar presentation was in December last year.  He was also on Lasix 40 mg daily at home.  Patient was admitted for the management of acute on chronic hyponatremia.  On presentation his sodium was 120.  He was given a liter of normal saline.  Assessment & Plan:   Active Problems:   Essential hypertension   Atrial fibrillation (HCC)   Cirrhosis, alcoholic (HCC)   Hyponatremia   Constrictive pericarditis   Chronic diastolic CHF (congestive heart failure) (HCC)   Acute on chronic hyponatremia: Attributed to liver cirrhosis and diuretics.  He was on Lasix 40 mg twice daily at home.  His mental status is stable and is alert and oriented.  He was given a liter of normal saline in the emergency department. Dropped further to 117 today.  Will check SIADH panel.  Will start on salt tablets.  Will hold Lasix.  Check BMP every 12 hours. His sodium is 126  at baseline. Nephrology consulted.  History of constrictive  pericarditis/diastolic congestive heart failure: Not decompensated currently.  He has been evaluated by TCS in the past and recommended continuing medical management.  He follows with heart failure team.  Diuretics on hold.  He is on Lasix and spironolactone at home. Echo on 10/20 showed ejection fraction of 60-65%, mild LVH.  Liver cirrhosis: Associated with ethanol use.continues to drink.  He has chronic ascites and s/p paracentesis in the past.  His abdomen  is not significantly distended or tense so paracentesis not ordered at this time.  Hypertension: Currently blood pressure stable.  Continue current medications.  Chronic alcohol abuse: Drinks 3 Rua and a glass of wine every day.Monitor CIWA.  Continue thiamine, folic acid.  Paroxysmal atrial fibrillation: Currently rate is controlled.S/p ablation. Not on beta-blockers, not on anticoagulation.  He was taking metoprolol in the past but not at present.  History of right lower lobe and left upper lobe nodules of unknown significance:Follows with Dr Maylon Peppers.  Underwent navigational bronchoscopy on 07/26/2018, none of the biopsies or cytology showed cancer.  There was plan for getting another opinion from radiation oncology.         DVT prophylaxis:Lovenox Code Status: Full code Family Communication: Son on phone Disposition Plan: Patient is from home.  he is clinically not stable for discharge due to hyponatremia.  He lives with his son and is ambulatory on baseline without any problems.   Consultants: Nephrology  Procedures: None  Antimicrobials:  Anti-infectives (From admission, onward)   None      Subjective: Patient seen and examined the bedside this morning.  Hemodynamically stable.  Currently alert and oriented.  Denies any complaints.  Eager to go home.  Looks very comfortable.  Very hard of hearing  Objective: Vitals:   07/04/19 2345 07/05/19 0000 07/05/19 0025 07/05/19 0413  BP: 116/70 120/78 112/71 106/75  Pulse: 76  83 67  Resp: 14  18 18   Temp:   98.4 F (36.9 C) 97.6 F (36.4 C)  TempSrc:   Oral Oral  SpO2: 97% 97% 100%  98%  Weight:   70.7 kg   Height:   5\' 6"  (1.676 m)     Intake/Output Summary (Last 24 hours) at 07/05/2019 0749 Last data filed at 07/05/2019 9379 Gross per 24 hour  Intake 303.5 ml  Output 300 ml  Net 3.5 ml   Filed Weights   07/04/19 1740 07/05/19 0025  Weight: 57.2 kg 70.7 kg    Examination:  General exam: Appears calm and  comfortable ,Not in distress,average built,HOH HEENT:PERRL,Oral mucosa moist, Ear/Nose normal on gross exam Respiratory system: Bilateral equal air entry, normal vesicular breath sounds, no wheezes or crackles  Cardiovascular system: Afib,. No JVD, murmurs, rubs, gallops or clicks. No pedal edema. Gastrointestinal system: Abdomen is distended, soft and nontender.Ascites. No organomegaly or masses felt. Normal bowel sounds heard. Central nervous system: Alert and oriented. No focal neurological deficits. Extremities: No edema, no clubbing ,no cyanosis, varicose veins Skin: No rashes, lesions or ulcers,no icterus ,no pallor.     Data Reviewed: I have personally reviewed following labs and imaging studies  CBC: Recent Labs  Lab 07/04/19 1746  WBC 5.7  HGB 13.2  HCT 37.4*  MCV 87.8  PLT 024   Basic Metabolic Panel: Recent Labs  Lab 07/04/19 1746 07/05/19 0204  NA 120* 117*  K 4.0 3.6  CL 82* 84*  CO2 25 22  GLUCOSE 106* 121*  BUN 14 13  CREATININE 0.70 0.67  CALCIUM 9.2 8.5*   GFR: Estimated Creatinine Clearance: 70.9 mL/min (by C-G formula based on SCr of 0.67 mg/dL). Liver Function Tests: Recent Labs  Lab 07/04/19 1754  AST 31  ALT 27  ALKPHOS 154*  BILITOT 2.5*  PROT 7.3  ALBUMIN 3.4*   No results for input(s): LIPASE, AMYLASE in the last 168 hours. No results for input(s): AMMONIA in the last 168 hours. Coagulation Profile: No results for input(s): INR, PROTIME in the last 168 hours. Cardiac Enzymes: No results for input(s): CKTOTAL, CKMB, CKMBINDEX, TROPONINI in the last 168 hours. BNP (last 3 results) Recent Labs    04/29/19 1450  PROBNP 1,007*   HbA1C: No results for input(s): HGBA1C in the last 72 hours. CBG: No results for input(s): GLUCAP in the last 168 hours. Lipid Profile: No results for input(s): CHOL, HDL, LDLCALC, TRIG, CHOLHDL, LDLDIRECT in the last 72 hours. Thyroid Function Tests: No results for input(s): TSH, T4TOTAL, FREET4,  T3FREE, THYROIDAB in the last 72 hours. Anemia Panel: No results for input(s): VITAMINB12, FOLATE, FERRITIN, TIBC, IRON, RETICCTPCT in the last 72 hours. Sepsis Labs: No results for input(s): PROCALCITON, LATICACIDVEN in the last 168 hours.  Recent Results (from the past 240 hour(s))  SARS CORONAVIRUS 2 (TAT 6-24 HRS) Nasopharyngeal Nasopharyngeal Swab     Status: None   Collection Time: 07/04/19  9:25 PM   Specimen: Nasopharyngeal Swab  Result Value Ref Range Status   SARS Coronavirus 2 NEGATIVE NEGATIVE Final    Comment: (NOTE) SARS-CoV-2 target nucleic acids are NOT DETECTED. The SARS-CoV-2 RNA is generally detectable in upper and lower respiratory specimens during the acute phase of infection. Negative results do not preclude SARS-CoV-2 infection, do not rule out co-infections with other pathogens, and should not be used as the sole basis for treatment or other patient management decisions. Negative results must be combined with clinical observations, patient history, and epidemiological information. The expected result is Negative. Fact Sheet for Patients: SugarRoll.be Fact Sheet for Healthcare Providers: https://www.woods-mathews.com/ This test is not yet approved or cleared by the [place] FDA and  has been authorized for detection and/or diagnosis of SARS-CoV-2 by FDA under an Emergency Use Authorization (EUA). This EUA will remain  in effect (meaning this test can be used) for the duration of the COVID-19 declaration under Section 56 4(b)(1) of the Act, 21 U.S.C. section 360bbb-3(b)(1), unless the authorization is terminated or revoked sooner. Performed at [facility], [place] [address], [place], [place] [address]          Radiology Studies: No results found.      Scheduled Meds: . enoxaparin (LOVENOX) injection  40 mg Subcutaneous Q24H  . escitalopram  10 mg Oral Daily  . pantoprazole  40 mg Oral Daily  .  potassium chloride  10 mEq Oral Daily  . thiamine  100 mg Oral Daily  . traZODone  50 mg Oral QHS   Continuous Infusions:   LOS: 0 days    Time spent: More than 50% of that time was spent in counseling and/or coordination of care.      Shelly Coss, MD [facility] P2/16/2021, 7:49 AM

## 2019-07-05 NOTE — Progress Notes (Signed)
CRITICAL VALUE ALERT  Critical Value:  Na 117  Date & Time Notied:  07/05/19 0309  Provider Notified: yes  Orders Received/Actions taken: waiting for response

## 2019-07-06 DIAGNOSIS — I5032 Chronic diastolic (congestive) heart failure: Secondary | ICD-10-CM

## 2019-07-06 DIAGNOSIS — I10 Essential (primary) hypertension: Secondary | ICD-10-CM

## 2019-07-06 DIAGNOSIS — I311 Chronic constrictive pericarditis: Secondary | ICD-10-CM

## 2019-07-06 DIAGNOSIS — E871 Hypo-osmolality and hyponatremia: Secondary | ICD-10-CM | POA: Diagnosis not present

## 2019-07-06 DIAGNOSIS — K219 Gastro-esophageal reflux disease without esophagitis: Secondary | ICD-10-CM | POA: Diagnosis not present

## 2019-07-06 DIAGNOSIS — K7031 Alcoholic cirrhosis of liver with ascites: Secondary | ICD-10-CM

## 2019-07-06 DIAGNOSIS — I4811 Longstanding persistent atrial fibrillation: Secondary | ICD-10-CM

## 2019-07-06 LAB — BASIC METABOLIC PANEL
Anion gap: 13 (ref 5–15)
BUN: 12 mg/dL (ref 8–23)
CO2: 23 mmol/L (ref 22–32)
Calcium: 8.7 mg/dL — ABNORMAL LOW (ref 8.9–10.3)
Chloride: 87 mmol/L — ABNORMAL LOW (ref 98–111)
Creatinine, Ser: 0.6 mg/dL — ABNORMAL LOW (ref 0.61–1.24)
GFR calc Af Amer: >60 mL/min (ref >60)
GFR calc non Af Amer: >60 mL/min (ref >60)
Glucose, Bld: 106 mg/dL — ABNORMAL HIGH (ref 70–99)
Potassium: 3.9 mmol/L (ref 3.5–5.1)
Sodium: 123 mmol/L — ABNORMAL LOW (ref 135–145)

## 2019-07-06 MED ORDER — FOLIC ACID 1 MG PO TABS: 1.0000 mg | ORAL_TABLET | Freq: Every day | ORAL | Status: DC

## 2019-07-06 MED ORDER — SODIUM CHLORIDE 1 G PO TABS: 2.0000 g | ORAL_TABLET | Freq: Two times a day (BID) | ORAL | 0 refills | Status: AC

## 2019-07-06 NOTE — Plan of Care (Signed)

## 2019-07-06 NOTE — Progress Notes (Signed)
RN reviewed discharge paperwork with patient. Prescription given to patient. Patient verbalized understanding and reported no further questions. Patient transported via wheelchair to entrance where ride is waiting.

## 2019-07-06 NOTE — Discharge Summary (Signed)
Physician Discharge Summary  ONA RATHERT TKW:[id number] DOB: 08-Sep-1940 DOA: 07/04/2019  PCP: Shon Baton, MD  Admit date: 07/04/2019 Discharge date: 07/06/2019  Time spent: 50 minutes  Recommendations for Outpatient Follow-up:  1. Follow-up with Shon Baton, MD in 1 week.  On follow-up patient will need a basic metabolic profile done to follow-up on electrolytes and renal function.  On follow-up salt tablets which patient was discharged on will need to be determined whether to decrease back to prior home dose. 2. Follow-up with Dr. Meda Coffee cardiology as scheduled.   Discharge Diagnoses:  Principal Problem:   Hyponatremia Active Problems:   Essential hypertension   Atrial fibrillation (HCC)   GERD   Cirrhosis, alcoholic (HCC)   Constrictive pericarditis   Chronic diastolic CHF (congestive heart failure) ([place])   Discharge Condition: Stable.  Diet recommendation: Heart healthy diet.  1200 cc fluid restriction per day.  Filed Weights   07/04/19 1740 07/05/19 0025  Weight: 57.2 kg 70.7 kg    History of present illness:  HPI per Dr. Malcolm Metro is a 79 y.o. male with medical history significant of HTN, cirrhosis with h/o frequent paracentesis for ascites, constrictive pericarditis, chronic diastolic heart failure managed in HF clinic. He is not a surgical candidate for pericardectomy 2/2 advanced cirrhosis and has been managed with diuretitcs. He has had hyponatremia to a low of 119 and is chronically hyponatremic. Per HF clinic note 06/07/19 he was doing better on a reduced schedule of diuretics. His need for paracentesis has dropped with last procedure more than 8 weeks ago. At his PCP's office today he had lab revealing a sodium of 120 and was subsequently referred to [facility]. He was asymptomatic.  ED Course: Hemodynamically stable in ED. Na confirmed at 120. He was given a liter of NS. He was referred to [facility] for observation admission to complete replenishment of  sodium.   Hospital Course:  1 acute on chronic hyponatremia Patient presented acute on chronic hyponatremia.  Baseline sodium 126.  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  Patient noted to be on Lasix 40 mg twice daily at home as well as spironolactone.  Patient's mental status was stable he was alert and oriented and asymptomatic throughout the hospitalization.  Patient denied any nausea or vomiting, denied any dosage changes with his Lexapro or trazodone.  Patient noted not to be on HCTZ.  Patient received a dose of 1 L normal saline in the ED however after his normal saline patient was placed on half-normal saline with sodium levels dropping as low as 117.  Patient was subsequently started on salt tablets and diuretics held.  Serum osmolality noted at 257.  Urine sodium noted at 29, urine creatinine of 51.81, urine osmolality of 327. TSH obtained was within normal limits at 0.925.  Random cortisol at 19.6.  Nephrology was consulted and saw the patient in consultation and recommended 1200 cc/day fluid restriction and to continue to hold patient's Lasix and Aldactone.  Chest x-ray ordered with no significant interval change as compared to chest x-ray from 05/17/2019.  Chest x-ray noted a persistent small to moderate bilateral pleural effusions with bibasilar atelectasis.  Unchanged cardiomegaly.  Sodium level improved to 123 by day of discharge and patient noted to be stable.  Nephrology felt patient was stable for discharge home and okay to resume patient's Lasix at his home dose however to continue to hold Aldactone.  Was recommended that patient will need repeat labs in 1 week  with his PCP and may need CT of the chest done to follow-up on his non-small cell lung cancer.  Patient be discharged on salt tablets of 2 g twice daily.  Patient remained in stable condition and patient will follow up with PCP in the outpatient setting.  2.  History of constrictive  pericarditis/diastolic CHF Patient was euvolemic during the hospitalization was not decompensated.  Patient noted to have been evaluated by TCS in the past who recommended continued medical management.  Patient follows with the heart failure team.  Patient's Aldactone and Lasix were held during the hospitalization due to problem #1.  Patient's Lasix will be resumed on discharge however per nephrology continue to hold Aldactone.  Outpatient follow-up.  3.  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  Patient continues to drink.  Patient with chronic ascites with paracentesis noted in the past.  Patient's abdomen was not significantly distended or tense and as such paracentesis not warranted during this hospitalization.  Outpatient follow-up.  4.  Hypertension Remained stable.  Patient's Aldactone and Lasix were held during the hospitalization.  Lasix will be resumed on discharge.  Outpatient follow-up.  5.  Chronic alcohol use Remained stable.  Patient maintained on thiamine and folic acid.  6.  Paroxysmal atrial fibrillation Is post ablation.  Remained rate controlled.  Patient noted to have been on beta-blocker in the past however no longer on a beta-blocker.  Patient not on anticoagulation.  Outpatient follow-up.  7.  History of right lower lobe and left upper lobe nodules of unknown significance Follows with Dr. Maylon Peppers.  Patient noted to have underwent navigational bronchoscopy on 07/26/2018 with negative biopsies, cytology was negative for cancer.  Plan was another opinion from radiation oncology.  Outpatient follow-up. Procedures:  Chest x-ray 07/05/2019    Consultations:  Nephrology: Dr. Marval Regal 07/05/2019  Discharge Exam: Vitals:   07/05/19 2114 07/06/19 0426  BP: 114/67 97/62  Pulse: 74 78  Resp: 16 20  Temp: (!) 97.5 F (36.4 C) (!) 97.5 F (36.4 C)  SpO2: 100% 100%    General: NAD Cardiovascular: RRR Respiratory: CTAB  Discharge Instructions   Discharge Instructions     Diet - low sodium heart healthy   Complete by: As directed    1200cc/day fluid restriction   Increase activity slowly   Complete by: As directed      Allergies as of 07/06/2019      Reactions   Lisinopril Anaphylaxis, Other (See Comments)   Hyperkalemia, Dizziness   Lipitor [atorvastatin] Other (See Comments)   Marked leg fatigue   Sulfonamide Derivatives Other (See Comments)   UNSPECIFIED REACTION       Medication List    STOP taking these medications   spironolactone 50 MG tablet Commonly known as: ALDACTONE     TAKE these medications   escitalopram 10 MG tablet Commonly known as: LEXAPRO Take 10 mg by mouth daily.   eucerin cream Apply 1 application topically as needed (irritated skin).   folic acid 1 MG tablet Commonly known as: FOLVITE Take 1 tablet (1 mg total) by mouth daily. What changed: when to take this   furosemide 80 MG tablet Commonly known as: LASIX Take 40 mg by mouth 2 (two) times daily.   Linzess 145 MCG Caps capsule Generic drug: linaclotide Take 145 mcg by mouth as needed (constipation).   LORazepam 0.5 MG tablet Commonly known as: ATIVAN Take 0.5 mg by mouth at bedtime as needed for sleep.   Mucinex Maximum Strength 1200 MG Tb12  Generic drug: Guaifenesin Take 1,200 mg by mouth daily as needed (congestion).   multivitamin with minerals Tabs tablet Take 1 tablet by mouth daily.   omeprazole 20 MG capsule Commonly known as: PRILOSEC Take 1 capsule (20 mg total) by mouth 2 (two) times daily. What changed:   when to take this  reasons to take this   potassium chloride 10 MEQ tablet Commonly known as: KLOR-CON Take 2 tablets (20 mEq total) by mouth daily. Take extra 4 tabs on Mon and Friday What changed:   how much to take  when to take this  additional instructions   sodium chloride 0.65 % Soln nasal spray Commonly known as: OCEAN Place 1 spray into both nostrils as needed for congestion.   sodium chloride 1 g  tablet Take 2 tablets (2 g total) by mouth 2 (two) times daily with a meal. What changed:   how much to take  when to take this   thiamine 100 MG tablet Take 1 tablet (100 mg total) by mouth daily.   traZODone 50 MG tablet Commonly known as: DESYREL Take 50 mg by mouth at bedtime.      Allergies  Allergen Reactions  . Lisinopril Anaphylaxis and Other (See Comments)    Hyperkalemia, Dizziness   . Lipitor [Atorvastatin] Other (See Comments)    Marked leg fatigue  . Sulfonamide Derivatives Other (See Comments)    UNSPECIFIED REACTION     Follow-up Information    Shon Baton, MD. Schedule an appointment as soon as possible for a visit in 1 week(s).   Specialty: Internal Medicine Contact information: [place] [address] [phone]        Dorothy Spark, MD .   Specialty: Cardiology Contact information: [place] [address] [phone]            The results of significant diagnostics from this hospitalization (including imaging, microbiology, ancillary and laboratory) are listed below for reference.    Significant Diagnostic Studies: DG Chest 2 View  Result Date: 07/05/2019 CLINICAL DATA:  Hyponatremia. Additional history provided: History of non-small cell lung cancer with acute on chronic hyponatremia. EXAM: CHEST - 2 VIEW COMPARISON:  Chest radiograph 07/17/2018, chest CT 01/20/2019 FINDINGS: Unchanged mild cardiomegaly. Aortic atherosclerosis. Pericardial calcifications better appreciated on prior CT 01/20/2019. Small to moderate bilateral pleural effusions redemonstrated with associated bibasilar atelectasis. Pneumonia at the lung bases cannot be excluded. No evidence of pneumothorax. No acute bony abnormality. IMPRESSION: No significant interval change as compared to chest radiograph 05/17/2019. Persistent small to moderate bilateral pleural effusions with bibasilar atelectasis. Pneumonia at the lung  bases cannot be excluded. Unchanged cardiomegaly. Redemonstrated aortic atherosclerosis and pericardial calcifications. Electronically Signed   By: Kellie Simmering DO   On: 07/05/2019 13:54    Microbiology: Recent Results (from the past 240 hour(s))  SARS CORONAVIRUS 2 (TAT 6-24 HRS) Nasopharyngeal Nasopharyngeal Swab     Status: None   Collection Time: 07/04/19  9:25 PM   Specimen: Nasopharyngeal Swab  Result Value Ref Range Status   SARS Coronavirus 2 NEGATIVE NEGATIVE Final    Comment: (NOTE) SARS-CoV-2 target nucleic acids are NOT DETECTED. The SARS-CoV-2 RNA is generally detectable in upper and lower respiratory specimens during the acute phase of infection. Negative results do not preclude SARS-CoV-2 infection, do not rule out co-infections with other pathogens, and should not be used as the sole basis for treatment or other patient management decisions. Negative results must be combined with clinical  observations, patient history, and epidemiological information. The expected result is Negative. Fact Sheet for Patients: SugarRoll.be Fact Sheet for Healthcare Providers: https://www.woods-mathews.com/ This test is not yet approved or cleared by the [place] FDA and  has been authorized for detection and/or diagnosis of SARS-CoV-2 by FDA under an Emergency Use Authorization (EUA). This EUA will remain  in effect (meaning this test can be used) for the duration of the COVID-19 declaration under Section 56 4(b)(1) of the Act, 21 U.S.C. section 360bbb-3(b)(1), unless the authorization is terminated or revoked sooner. Performed at [facility], [place] [address], [place], [place] [address]      Labs: Basic Metabolic Panel: Recent Labs  Lab 07/04/19 1746 07/05/19 0204 07/05/19 1135 07/05/19 1950 07/06/19 0345  NA 120* 117* 120* 123* 123*  K 4.0 3.6 4.2 4.1 3.9  CL 82* 84* 86* 86* 87*  CO2 25 22 24 26 23   GLUCOSE 106* 121*  124* 78 106*  BUN 14 13 12 13 12   CREATININE 0.70 0.67 0.78 0.83 0.60*  CALCIUM 9.2 8.5* 8.5* 8.7* 8.7*  PHOS  --   --  3.0  --   --    Liver Function Tests: Recent Labs  Lab 07/04/19 1754 07/05/19 1135  AST 31  --   ALT 27  --   ALKPHOS 154*  --   BILITOT 2.5*  --   PROT 7.3  --   ALBUMIN 3.4* 2.9*   No results for input(s): LIPASE, AMYLASE in the last 168 hours. No results for input(s): AMMONIA in the last 168 hours. CBC: Recent Labs  Lab 07/04/19 1746  WBC 5.7  HGB 13.2  HCT 37.4*  MCV 87.8  PLT 207   Cardiac Enzymes: No results for input(s): CKTOTAL, CKMB, CKMBINDEX, TROPONINI in the last 168 hours. BNP: BNP (last 3 results) Recent Labs    08/25/18 1316 03/09/19 0034 05/17/19 1509  BNP 586.3* 303.9* 152.2*    ProBNP (last 3 results) Recent Labs    04/29/19 1450  PROBNP 1,007*    CBG: No results for input(s): GLUCAP in the last 168 hours.     Signed:  Irine Seal MD.  [facility] 07/06/2019, 12:24 PM

## 2019-07-06 NOTE — Progress Notes (Signed)
Patient ID: [name], male   DOB: November 15, 1940, 79 y.o.   MRN: [id number] S: Feels well and wants to go home. O:BP 97/62 (BP Location: Right Arm)   Pulse 78   Temp (!) 97.5 F (36.4 C) (Oral)   Resp 20   Ht 5\' 6"  (1.676 m)   Wt 70.7 kg   SpO2 100%   BMI 25.16 kg/m   Intake/Output Summary (Last 24 hours) at 07/06/2019 1008 Last data filed at 07/06/2019 0359 Gross per 24 hour  Intake 924 ml  Output 600 ml  Net 324 ml   Intake/Output: I/O last 3 completed shifts: In: 1587.5 [P.O.:1284; I.V.:303.5] Out: 1175 [Urine:1175]  Intake/Output this shift:  No intake/output data recorded. Weight change:  Gen: NAD CVS: no rub Resp: cta Abd: +BS, soft, NT/ND Ext: minimal edema  Recent Labs  Lab 07/04/19 1746 07/04/19 1754 07/05/19 0204 07/05/19 1135 07/05/19 1950 07/06/19 0345  NA 120*  --  117* 120* 123* 123*  K 4.0  --  3.6 4.2 4.1 3.9  CL 82*  --  84* 86* 86* 87*  CO2 25  --  22 24 26 23   GLUCOSE 106*  --  121* 124* 78 106*  BUN 14  --  13 12 13 12   CREATININE 0.70  --  0.67 0.78 0.83 0.60*  ALBUMIN  --  3.4*  --  2.9*  --   --   CALCIUM 9.2  --  8.5* 8.5* 8.7* 8.7*  PHOS  --   --   --  3.0  --   --   AST  --  31  --   --   --   --   ALT  --  27  --   --   --   --    Liver Function Tests: Recent Labs  Lab 07/04/19 1754 07/05/19 1135  AST 31  --   ALT 27  --   ALKPHOS 154*  --   BILITOT 2.5*  --   PROT 7.3  --   ALBUMIN 3.4* 2.9*   No results for input(s): LIPASE, AMYLASE in the last 168 hours. No results for input(s): AMMONIA in the last 168 hours. CBC: Recent Labs  Lab 07/04/19 1746  WBC 5.7  HGB 13.2  HCT 37.4*  MCV 87.8  PLT 207   Cardiac Enzymes: No results for input(s): CKTOTAL, CKMB, CKMBINDEX, TROPONINI in the last 168 hours. CBG: No results for input(s): GLUCAP in the last 168 hours.  Iron Studies: No results for input(s): IRON, TIBC, TRANSFERRIN, FERRITIN in the last 72 hours. Studies/Results: DG Chest 2 View  Result Date:  07/05/2019 CLINICAL DATA:  Hyponatremia. Additional history provided: History of non-small cell lung cancer with acute on chronic hyponatremia. EXAM: CHEST - 2 VIEW COMPARISON:  Chest radiograph 07/17/2018, chest CT 01/20/2019 FINDINGS: Unchanged mild cardiomegaly. Aortic atherosclerosis. Pericardial calcifications better appreciated on prior CT 01/20/2019. Small to moderate bilateral pleural effusions redemonstrated with associated bibasilar atelectasis. Pneumonia at the lung bases cannot be excluded. No evidence of pneumothorax. No acute bony abnormality. IMPRESSION: No significant interval change as compared to chest radiograph 05/17/2019. Persistent small to moderate bilateral pleural effusions with bibasilar atelectasis. Pneumonia at the lung bases cannot be excluded. Unchanged cardiomegaly. Redemonstrated aortic atherosclerosis and pericardial calcifications. Electronically Signed   By: Kellie Simmering DO   On: 07/05/2019 13:54   . enoxaparin (LOVENOX) injection  40 mg Subcutaneous Q24H  . escitalopram  10 mg Oral Daily  . folic acid  1 mg Oral QHS  . pantoprazole  40 mg Oral Daily  . potassium chloride  10 mEq Oral Daily  . sodium chloride  2 g Oral BID WC  . thiamine  100 mg Oral Daily  . traZODone  50 mg Oral QHS    BMET    Component Value Date/Time   NA 123 (L) 07/06/2019 0345   NA 128 (L) 04/29/2019 1450   K 3.9 07/06/2019 0345   CL 87 (L) 07/06/2019 0345   CO2 23 07/06/2019 0345   GLUCOSE 106 (H) 07/06/2019 0345   BUN 12 07/06/2019 0345   BUN 10 04/29/2019 1450   CREATININE 0.60 (L) 07/06/2019 0345   CREATININE 0.86 01/20/2019 0945   CALCIUM 8.7 (L) 07/06/2019 0345   GFRNONAA >60 07/06/2019 0345   GFRNONAA >60 01/20/2019 0945   GFRAA >60 07/06/2019 0345   GFRAA >60 01/20/2019 0945   CBC    Component Value Date/Time   WBC 5.7 07/04/2019 1746   RBC 4.26 07/04/2019 1746   HGB 13.2 07/04/2019 1746   HGB 13.2 04/18/2019 0829   HCT 37.4 (L) 07/04/2019 1746   HCT 39.1  04/18/2019 0829   PLT 207 07/04/2019 1746   PLT 234 04/18/2019 0829   MCV 87.8 07/04/2019 1746   MCV 88 04/18/2019 0829   MCH 31.0 07/04/2019 1746   MCHC 35.3 07/04/2019 1746   RDW 15.3 07/04/2019 1746   RDW 16.9 (H) 04/18/2019 0829   LYMPHSABS 0.4 (L) 05/20/2019 0157   MONOABS 0.8 05/20/2019 0157   EOSABS 0.1 05/20/2019 0157   BASOSABS 0.0 05/20/2019 0157   Assessment/Plan: 1.  Hyponatremia- acute on chronic:  He appears euvolemic on exam and does not have any history of n/v/d to explain the drop from his baseline.  He did receive hypotonic IVF's last night (received 1/2NS for 6 hours) which may explain the drop in sodium levels.  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  He remains completely asymptomatic.  Repeat labs showed the sodium improved to 123 and has been stable. 1. Would continue with fluid restriction of 1200 ml/day after discharge 2. Ok to resume home lasix dose but cont to hold aldactone.   3. Recheck sodium levels next week with his pcp 4. Stable for discharge to home with follow up as above 5. He may also need CT scan of his chest (last done in Sept) as he has a history of non-small cell lung cancer that was diagnosed a year ago.   6. Thankfully he is completely asymptomatic, likely due to his chronic hyponatremia. 7. Will sign off.  Please call with questions or concerns. 2. Cirrhosis with h/o ascites- currently without evidence of ascites or edema 3. dCHF- euvolemic.  Hold lasix and aldactone for now.  Stop IVF's and ok to resume outpatient lasix dose but cont to hold aldactone. 4. HTN- somewhat low BP.  Check orthostatics.  Donetta Potts, MD [employer] [phone]

## 2019-07-11 DIAGNOSIS — E871 Hypo-osmolality and hyponatremia: Secondary | ICD-10-CM | POA: Diagnosis not present

## 2019-07-11 DIAGNOSIS — F1010 Alcohol abuse, uncomplicated: Secondary | ICD-10-CM | POA: Diagnosis not present

## 2019-07-11 DIAGNOSIS — R918 Other nonspecific abnormal finding of lung field: Secondary | ICD-10-CM | POA: Diagnosis not present

## 2019-07-11 DIAGNOSIS — I311 Chronic constrictive pericarditis: Secondary | ICD-10-CM | POA: Diagnosis not present

## 2019-07-11 DIAGNOSIS — I4892 Unspecified atrial flutter: Secondary | ICD-10-CM | POA: Diagnosis not present

## 2019-07-11 DIAGNOSIS — K7460 Unspecified cirrhosis of liver: Secondary | ICD-10-CM | POA: Diagnosis not present

## 2019-07-11 DIAGNOSIS — I130 Hypertensive heart and chronic kidney disease with heart failure and stage 1 through stage 4 chronic kidney disease, or unspecified chronic kidney disease: Secondary | ICD-10-CM | POA: Diagnosis not present

## 2019-07-15 ENCOUNTER — Telehealth: Payer: Self-pay | Admitting: *Deleted

## 2019-07-15 NOTE — Telephone Encounter (Signed)
CALLED PATIENT TO INFORM OF CT FOR 07-21-19 - ARRIVAL TIME- 7:15 AM @ [facility], PATIENT TO HAVE WATER ONLY - 4 HRS. PRIOR TO TEST, PATIENT TO RECEIVE RESULTS FROM ALISON PERKINS ON 07-25-19 @ 1 PM, LVM FOR A RETURN CALL

## 2019-07-19 DIAGNOSIS — E871 Hypo-osmolality and hyponatremia: Secondary | ICD-10-CM | POA: Diagnosis not present

## 2019-07-20 ENCOUNTER — Telehealth: Payer: Self-pay | Admitting: *Deleted

## 2019-07-20 NOTE — Telephone Encounter (Signed)
Called patient to inform that CT is still scheduled for 07-21-19, lvm for a return call

## 2019-07-21 ENCOUNTER — Other Ambulatory Visit (HOSPITAL_COMMUNITY): Payer: Self-pay | Admitting: Internal Medicine

## 2019-07-21 ENCOUNTER — Ambulatory Visit (HOSPITAL_COMMUNITY): Payer: Medicare HMO

## 2019-07-21 DIAGNOSIS — K7460 Unspecified cirrhosis of liver: Secondary | ICD-10-CM

## 2019-07-21 DIAGNOSIS — R188 Other ascites: Secondary | ICD-10-CM

## 2019-07-22 ENCOUNTER — Other Ambulatory Visit: Payer: Self-pay

## 2019-07-22 ENCOUNTER — Ambulatory Visit (HOSPITAL_COMMUNITY)
Admission: RE | Admit: 2019-07-22 | Discharge: 2019-07-22 | Disposition: A | Discharge status: Home / Self Care | Payer: Medicare HMO | Source: Ambulatory Visit | Attending: Internal Medicine | Admitting: Internal Medicine

## 2019-07-22 DIAGNOSIS — R188 Other ascites: Secondary | ICD-10-CM | POA: Diagnosis not present

## 2019-07-22 DIAGNOSIS — K7460 Unspecified cirrhosis of liver: Secondary | ICD-10-CM | POA: Diagnosis not present

## 2019-07-22 HISTORY — PX: IR PARACENTESIS: IMG2679

## 2019-07-22 MED ORDER — LIDOCAINE HCL (PF) 1 % IJ SOLN
INTRAMUSCULAR | Status: DC | PRN
  Administered 2019-07-22: 10 mL

## 2019-07-22 MED ORDER — LIDOCAINE HCL 1 % IJ SOLN
INTRAMUSCULAR | Status: AC
  Filled 2019-07-22: qty 20

## 2019-07-22 NOTE — Procedures (Signed)
Ultrasound-guided  therapeutic paracentesis performed yielding 1.8 liters of straw colored fluid.  No immediate complications. EBL is none.

## 2019-07-25 ENCOUNTER — Ambulatory Visit: Payer: Self-pay | Admitting: Radiation Oncology

## 2019-07-27 DIAGNOSIS — E871 Hypo-osmolality and hyponatremia: Secondary | ICD-10-CM | POA: Diagnosis not present

## 2019-08-01 ENCOUNTER — Other Ambulatory Visit (HOSPITAL_COMMUNITY): Payer: Self-pay | Admitting: Internal Medicine

## 2019-08-01 ENCOUNTER — Other Ambulatory Visit: Payer: Self-pay | Admitting: Internal Medicine

## 2019-08-01 DIAGNOSIS — R188 Other ascites: Secondary | ICD-10-CM

## 2019-08-01 DIAGNOSIS — K7031 Alcoholic cirrhosis of liver with ascites: Secondary | ICD-10-CM

## 2019-08-03 ENCOUNTER — Other Ambulatory Visit (HOSPITAL_COMMUNITY): Payer: Self-pay | Admitting: Internal Medicine

## 2019-08-03 ENCOUNTER — Ambulatory Visit (HOSPITAL_COMMUNITY)
Admission: RE | Admit: 2019-08-03 | Discharge: 2019-08-03 | Disposition: A | Discharge status: Home / Self Care | Payer: Medicare HMO | Source: Ambulatory Visit | Attending: Internal Medicine | Admitting: Internal Medicine

## 2019-08-03 ENCOUNTER — Other Ambulatory Visit: Payer: Self-pay

## 2019-08-03 DIAGNOSIS — K7031 Alcoholic cirrhosis of liver with ascites: Secondary | ICD-10-CM | POA: Diagnosis not present

## 2019-08-03 DIAGNOSIS — R188 Other ascites: Secondary | ICD-10-CM

## 2019-08-03 HISTORY — PX: IR PARACENTESIS: IMG2679

## 2019-08-03 MED ORDER — LIDOCAINE HCL (PF) 1 % IJ SOLN
INTRAMUSCULAR | Status: AC | PRN
  Administered 2019-08-03: 10 mL

## 2019-08-03 MED ORDER — LIDOCAINE HCL 1 % IJ SOLN
INTRAMUSCULAR | Status: AC
  Filled 2019-08-03: qty 20

## 2019-08-03 NOTE — Procedures (Signed)
PROCEDURE SUMMARY:  Successful US guided paracentesis from left lateral abdomen.  Yielded 2.7 liters of yellow fluid.  No immediate complications.  Pt tolerated well.   Specimen was not sent for labs.  EBL < 46mL  Docia Barrier PA-C 08/03/2019 11:41 AM

## 2019-08-04 ENCOUNTER — Ambulatory Visit (HOSPITAL_COMMUNITY): Admission: RE | Admit: 2019-08-04 | Payer: Medicare HMO | Source: Ambulatory Visit

## 2019-08-08 ENCOUNTER — Ambulatory Visit: Admission: RE | Admit: 2019-08-08 | Payer: Medicare HMO | Source: Ambulatory Visit | Admitting: Radiation Oncology

## 2019-08-11 DIAGNOSIS — E871 Hypo-osmolality and hyponatremia: Secondary | ICD-10-CM | POA: Diagnosis not present

## 2019-08-15 DIAGNOSIS — Z48813 Encounter for surgical aftercare following surgery on the respiratory system: Secondary | ICD-10-CM | POA: Diagnosis not present

## 2019-08-15 DIAGNOSIS — Z4682 Encounter for fitting and adjustment of non-vascular catheter: Secondary | ICD-10-CM | POA: Diagnosis not present

## 2019-08-15 DIAGNOSIS — I110 Hypertensive heart disease with heart failure: Secondary | ICD-10-CM | POA: Diagnosis not present

## 2019-08-15 DIAGNOSIS — Z7189 Other specified counseling: Secondary | ICD-10-CM | POA: Diagnosis not present

## 2019-08-15 DIAGNOSIS — R0989 Other specified symptoms and signs involving the circulatory and respiratory systems: Secondary | ICD-10-CM | POA: Diagnosis not present

## 2019-08-15 DIAGNOSIS — R911 Solitary pulmonary nodule: Secondary | ICD-10-CM | POA: Diagnosis not present

## 2019-08-15 DIAGNOSIS — R0602 Shortness of breath: Secondary | ICD-10-CM | POA: Diagnosis not present

## 2019-08-15 DIAGNOSIS — K7031 Alcoholic cirrhosis of liver with ascites: Secondary | ICD-10-CM | POA: Diagnosis not present

## 2019-08-15 DIAGNOSIS — R601 Generalized edema: Secondary | ICD-10-CM | POA: Diagnosis not present

## 2019-08-15 DIAGNOSIS — K5900 Constipation, unspecified: Secondary | ICD-10-CM | POA: Diagnosis not present

## 2019-08-15 DIAGNOSIS — K219 Gastro-esophageal reflux disease without esophagitis: Secondary | ICD-10-CM | POA: Diagnosis not present

## 2019-08-15 DIAGNOSIS — I319 Disease of pericardium, unspecified: Secondary | ICD-10-CM | POA: Diagnosis not present

## 2019-08-15 DIAGNOSIS — J95811 Postprocedural pneumothorax: Secondary | ICD-10-CM | POA: Diagnosis not present

## 2019-08-15 DIAGNOSIS — I517 Cardiomegaly: Secondary | ICD-10-CM | POA: Diagnosis not present

## 2019-08-15 DIAGNOSIS — R110 Nausea: Secondary | ICD-10-CM | POA: Diagnosis not present

## 2019-08-15 DIAGNOSIS — R52 Pain, unspecified: Secondary | ICD-10-CM | POA: Diagnosis not present

## 2019-08-15 DIAGNOSIS — R069 Unspecified abnormalities of breathing: Secondary | ICD-10-CM | POA: Diagnosis not present

## 2019-08-15 DIAGNOSIS — R778 Other specified abnormalities of plasma proteins: Secondary | ICD-10-CM | POA: Diagnosis not present

## 2019-08-15 DIAGNOSIS — J939 Pneumothorax, unspecified: Secondary | ICD-10-CM | POA: Diagnosis not present

## 2019-08-15 DIAGNOSIS — F419 Anxiety disorder, unspecified: Secondary | ICD-10-CM | POA: Diagnosis not present

## 2019-08-15 DIAGNOSIS — E871 Hypo-osmolality and hyponatremia: Secondary | ICD-10-CM | POA: Diagnosis not present

## 2019-08-15 DIAGNOSIS — Z515 Encounter for palliative care: Secondary | ICD-10-CM | POA: Diagnosis not present

## 2019-08-15 DIAGNOSIS — R0603 Acute respiratory distress: Secondary | ICD-10-CM | POA: Diagnosis not present

## 2019-08-15 DIAGNOSIS — I10 Essential (primary) hypertension: Secondary | ICD-10-CM | POA: Diagnosis not present

## 2019-08-15 DIAGNOSIS — J9383 Other pneumothorax: Secondary | ICD-10-CM | POA: Diagnosis not present

## 2019-08-15 DIAGNOSIS — I5032 Chronic diastolic (congestive) heart failure: Secondary | ICD-10-CM | POA: Diagnosis not present

## 2019-08-15 DIAGNOSIS — J9601 Acute respiratory failure with hypoxia: Secondary | ICD-10-CM | POA: Diagnosis not present

## 2019-08-15 DIAGNOSIS — I311 Chronic constrictive pericarditis: Secondary | ICD-10-CM | POA: Diagnosis not present

## 2019-08-15 DIAGNOSIS — R188 Other ascites: Secondary | ICD-10-CM | POA: Diagnosis not present

## 2019-08-15 DIAGNOSIS — J984 Other disorders of lung: Secondary | ICD-10-CM | POA: Diagnosis not present

## 2019-08-15 DIAGNOSIS — R161 Splenomegaly, not elsewhere classified: Secondary | ICD-10-CM | POA: Diagnosis not present

## 2019-08-15 DIAGNOSIS — J189 Pneumonia, unspecified organism: Secondary | ICD-10-CM | POA: Diagnosis not present

## 2019-08-15 DIAGNOSIS — J90 Pleural effusion, not elsewhere classified: Secondary | ICD-10-CM | POA: Diagnosis not present

## 2019-08-15 DIAGNOSIS — G92 Toxic encephalopathy: Secondary | ICD-10-CM | POA: Diagnosis not present

## 2019-08-15 DIAGNOSIS — G4700 Insomnia, unspecified: Secondary | ICD-10-CM | POA: Diagnosis not present

## 2019-08-15 DIAGNOSIS — R918 Other nonspecific abnormal finding of lung field: Secondary | ICD-10-CM | POA: Diagnosis not present

## 2019-08-15 DIAGNOSIS — J9811 Atelectasis: Secondary | ICD-10-CM | POA: Diagnosis not present

## 2019-08-15 DIAGNOSIS — K7460 Unspecified cirrhosis of liver: Secondary | ICD-10-CM | POA: Diagnosis not present

## 2019-08-15 DIAGNOSIS — F1010 Alcohol abuse, uncomplicated: Secondary | ICD-10-CM | POA: Diagnosis not present

## 2019-08-16 ENCOUNTER — Ambulatory Visit (HOSPITAL_COMMUNITY): Payer: Medicare HMO

## 2019-08-16 DIAGNOSIS — I5032 Chronic diastolic (congestive) heart failure: Secondary | ICD-10-CM | POA: Diagnosis not present

## 2019-08-16 DIAGNOSIS — J984 Other disorders of lung: Secondary | ICD-10-CM | POA: Diagnosis not present

## 2019-08-16 DIAGNOSIS — J90 Pleural effusion, not elsewhere classified: Secondary | ICD-10-CM | POA: Diagnosis not present

## 2019-08-16 DIAGNOSIS — Z515 Encounter for palliative care: Secondary | ICD-10-CM | POA: Diagnosis not present

## 2019-08-16 DIAGNOSIS — R918 Other nonspecific abnormal finding of lung field: Secondary | ICD-10-CM | POA: Diagnosis not present

## 2019-08-16 DIAGNOSIS — R110 Nausea: Secondary | ICD-10-CM | POA: Diagnosis not present

## 2019-08-16 DIAGNOSIS — J9601 Acute respiratory failure with hypoxia: Secondary | ICD-10-CM | POA: Diagnosis not present

## 2019-08-16 DIAGNOSIS — R601 Generalized edema: Secondary | ICD-10-CM | POA: Diagnosis not present

## 2019-08-16 DIAGNOSIS — J939 Pneumothorax, unspecified: Secondary | ICD-10-CM | POA: Diagnosis not present

## 2019-08-16 DIAGNOSIS — E871 Hypo-osmolality and hyponatremia: Secondary | ICD-10-CM | POA: Diagnosis not present

## 2019-08-16 DIAGNOSIS — K7031 Alcoholic cirrhosis of liver with ascites: Secondary | ICD-10-CM | POA: Diagnosis not present

## 2019-08-16 DIAGNOSIS — J9383 Other pneumothorax: Secondary | ICD-10-CM | POA: Diagnosis not present

## 2019-08-16 DIAGNOSIS — R0602 Shortness of breath: Secondary | ICD-10-CM | POA: Diagnosis not present

## 2019-08-16 DIAGNOSIS — K5900 Constipation, unspecified: Secondary | ICD-10-CM | POA: Diagnosis not present

## 2019-08-17 ENCOUNTER — Encounter: Payer: Self-pay | Admitting: *Deleted

## 2019-08-17 DIAGNOSIS — R161 Splenomegaly, not elsewhere classified: Secondary | ICD-10-CM | POA: Diagnosis not present

## 2019-08-17 DIAGNOSIS — J189 Pneumonia, unspecified organism: Secondary | ICD-10-CM | POA: Diagnosis not present

## 2019-08-17 DIAGNOSIS — Z7189 Other specified counseling: Secondary | ICD-10-CM | POA: Diagnosis not present

## 2019-08-17 DIAGNOSIS — Z515 Encounter for palliative care: Secondary | ICD-10-CM | POA: Diagnosis not present

## 2019-08-17 DIAGNOSIS — J9811 Atelectasis: Secondary | ICD-10-CM | POA: Diagnosis not present

## 2019-08-17 DIAGNOSIS — K7460 Unspecified cirrhosis of liver: Secondary | ICD-10-CM | POA: Diagnosis not present

## 2019-08-17 DIAGNOSIS — F419 Anxiety disorder, unspecified: Secondary | ICD-10-CM | POA: Diagnosis not present

## 2019-08-17 DIAGNOSIS — Z48813 Encounter for surgical aftercare following surgery on the respiratory system: Secondary | ICD-10-CM | POA: Diagnosis not present

## 2019-08-17 DIAGNOSIS — J90 Pleural effusion, not elsewhere classified: Secondary | ICD-10-CM | POA: Diagnosis not present

## 2019-08-17 DIAGNOSIS — R601 Generalized edema: Secondary | ICD-10-CM | POA: Diagnosis not present

## 2019-08-17 DIAGNOSIS — R110 Nausea: Secondary | ICD-10-CM | POA: Diagnosis not present

## 2019-08-17 DIAGNOSIS — J939 Pneumothorax, unspecified: Secondary | ICD-10-CM | POA: Diagnosis not present

## 2019-08-17 DIAGNOSIS — J9601 Acute respiratory failure with hypoxia: Secondary | ICD-10-CM | POA: Diagnosis not present

## 2019-08-17 DIAGNOSIS — R0602 Shortness of breath: Secondary | ICD-10-CM | POA: Diagnosis not present

## 2019-08-17 DIAGNOSIS — R188 Other ascites: Secondary | ICD-10-CM | POA: Diagnosis not present

## 2019-08-17 DIAGNOSIS — K5900 Constipation, unspecified: Secondary | ICD-10-CM | POA: Diagnosis not present

## 2019-08-17 DIAGNOSIS — K7031 Alcoholic cirrhosis of liver with ascites: Secondary | ICD-10-CM | POA: Diagnosis not present

## 2019-08-17 MED ORDER — GENERIC EXTERNAL MEDICATION
Status: DC
Start: ? — End: 2019-08-17

## 2019-08-17 MED ORDER — LORAZEPAM 0.5 MG PO TABS
0.50 | ORAL_TABLET | ORAL | Status: DC
Start: ? — End: 2019-08-17

## 2019-08-17 MED ORDER — FUROSEMIDE 10 MG/ML IJ SOLN
80.00 | INTRAMUSCULAR | Status: DC
Start: 2019-08-18 — End: 2019-08-17

## 2019-08-17 MED ORDER — DSS 100 MG PO CAPS
100.00 | ORAL_CAPSULE | ORAL | Status: DC
Start: ? — End: 2019-08-17

## 2019-08-17 MED ORDER — FOLIC ACID 1 MG PO TABS
1.00 | ORAL_TABLET | ORAL | Status: DC
Start: 2019-08-26 — End: 2019-08-17

## 2019-08-17 MED ORDER — ACETAMINOPHEN 325 MG PO TABS
650.00 | ORAL_TABLET | ORAL | Status: DC
Start: ? — End: 2019-08-17

## 2019-08-17 MED ORDER — ONDANSETRON HCL 4 MG/2ML IJ SOLN
4.00 | INTRAMUSCULAR | Status: DC
Start: ? — End: 2019-08-17

## 2019-08-17 MED ORDER — THIAMINE HCL 100 MG PO TABS
100.00 | ORAL_TABLET | ORAL | Status: DC
Start: 2019-08-26 — End: 2019-08-17

## 2019-08-17 MED ORDER — SENNOSIDES-DOCUSATE SODIUM 8.6-50 MG PO TABS
2.00 | ORAL_TABLET | ORAL | Status: DC
Start: ? — End: 2019-08-17

## 2019-08-17 MED ORDER — LOPERAMIDE HCL 2 MG PO CAPS
2.00 | ORAL_CAPSULE | ORAL | Status: DC
Start: ? — End: 2019-08-17

## 2019-08-17 MED ORDER — POLYETHYLENE GLYCOL 3350 17 GM/SCOOP PO POWD
17.00 | ORAL | Status: DC
Start: ? — End: 2019-08-17

## 2019-08-17 MED ORDER — ESCITALOPRAM OXALATE 10 MG PO TABS
10.00 | ORAL_TABLET | ORAL | Status: DC
Start: 2019-08-26 — End: 2019-08-17

## 2019-08-17 MED ORDER — PROMETHAZINE HCL 25 MG/ML IJ SOLN
12.50 | INTRAMUSCULAR | Status: DC
Start: ? — End: 2019-08-17

## 2019-08-17 MED ORDER — LINACLOTIDE 145 MCG PO CAPS
145.00 | ORAL_CAPSULE | ORAL | Status: DC
Start: ? — End: 2019-08-17

## 2019-08-17 MED ORDER — SODIUM CHLORIDE 0.9 % IV SOLN
10.00 | INTRAVENOUS | Status: DC
Start: ? — End: 2019-08-17

## 2019-08-17 MED ORDER — NITROGLYCERIN 0.4 MG SL SUBL
0.40 | SUBLINGUAL_TABLET | SUBLINGUAL | Status: DC
Start: ? — End: 2019-08-17

## 2019-08-17 MED ORDER — GENERIC EXTERNAL MEDICATION
5.00 | Status: DC
Start: ? — End: 2019-08-17

## 2019-08-17 MED ORDER — HEPARIN SODIUM (PORCINE) 5000 UNIT/ML IJ SOLN
5000.00 | INTRAMUSCULAR | Status: DC
Start: 2019-08-25 — End: 2019-08-17

## 2019-08-17 MED ORDER — TRAMADOL HCL 50 MG PO TABS
50.00 | ORAL_TABLET | ORAL | Status: DC
Start: ? — End: 2019-08-17

## 2019-08-17 MED ORDER — SIMETHICONE 80 MG PO CHEW
80.00 | CHEWABLE_TABLET | ORAL | Status: DC
Start: ? — End: 2019-08-17

## 2019-08-17 MED ORDER — PANTOPRAZOLE SODIUM 40 MG PO TBEC
40.00 | DELAYED_RELEASE_TABLET | ORAL | Status: DC
Start: 2019-08-26 — End: 2019-08-17

## 2019-08-17 MED ORDER — LABETALOL HCL 5 MG/ML IV SOLN
20.00 | INTRAVENOUS | Status: DC
Start: ? — End: 2019-08-17

## 2019-08-17 MED ORDER — TRAZODONE HCL 50 MG PO TABS
50.00 | ORAL_TABLET | ORAL | Status: DC
Start: 2019-08-25 — End: 2019-08-17

## 2019-08-18 ENCOUNTER — Ambulatory Visit
Admission: RE | Admit: 2019-08-18 | Discharge: 2019-08-18 | Disposition: A | Discharge status: Home / Self Care | Payer: Medicare HMO | Source: Ambulatory Visit | Attending: Radiation Oncology | Admitting: Radiation Oncology

## 2019-08-18 DIAGNOSIS — F419 Anxiety disorder, unspecified: Secondary | ICD-10-CM | POA: Diagnosis not present

## 2019-08-18 DIAGNOSIS — Z515 Encounter for palliative care: Secondary | ICD-10-CM | POA: Diagnosis not present

## 2019-08-18 DIAGNOSIS — J90 Pleural effusion, not elsewhere classified: Secondary | ICD-10-CM | POA: Diagnosis not present

## 2019-08-18 DIAGNOSIS — K5900 Constipation, unspecified: Secondary | ICD-10-CM | POA: Diagnosis not present

## 2019-08-18 DIAGNOSIS — J939 Pneumothorax, unspecified: Secondary | ICD-10-CM | POA: Diagnosis not present

## 2019-08-18 DIAGNOSIS — K7460 Unspecified cirrhosis of liver: Secondary | ICD-10-CM | POA: Diagnosis not present

## 2019-08-18 DIAGNOSIS — J9811 Atelectasis: Secondary | ICD-10-CM | POA: Diagnosis not present

## 2019-08-18 DIAGNOSIS — K7031 Alcoholic cirrhosis of liver with ascites: Secondary | ICD-10-CM | POA: Diagnosis not present

## 2019-08-18 DIAGNOSIS — I319 Disease of pericardium, unspecified: Secondary | ICD-10-CM | POA: Diagnosis not present

## 2019-08-18 DIAGNOSIS — R601 Generalized edema: Secondary | ICD-10-CM | POA: Diagnosis not present

## 2019-08-18 DIAGNOSIS — R911 Solitary pulmonary nodule: Secondary | ICD-10-CM | POA: Diagnosis not present

## 2019-08-18 DIAGNOSIS — R0602 Shortness of breath: Secondary | ICD-10-CM | POA: Diagnosis not present

## 2019-08-18 DIAGNOSIS — J9601 Acute respiratory failure with hypoxia: Secondary | ICD-10-CM | POA: Diagnosis not present

## 2019-08-18 DIAGNOSIS — R110 Nausea: Secondary | ICD-10-CM | POA: Diagnosis not present

## 2019-08-18 DIAGNOSIS — Z7189 Other specified counseling: Secondary | ICD-10-CM | POA: Diagnosis not present

## 2019-08-18 MED ORDER — GENERIC EXTERNAL MEDICATION
Status: DC
Start: ? — End: 2019-08-18

## 2019-08-19 ENCOUNTER — Other Ambulatory Visit: Payer: Self-pay

## 2019-08-19 DIAGNOSIS — R918 Other nonspecific abnormal finding of lung field: Secondary | ICD-10-CM | POA: Diagnosis not present

## 2019-08-19 DIAGNOSIS — J90 Pleural effusion, not elsewhere classified: Secondary | ICD-10-CM | POA: Diagnosis not present

## 2019-08-19 DIAGNOSIS — R0602 Shortness of breath: Secondary | ICD-10-CM | POA: Diagnosis not present

## 2019-08-19 DIAGNOSIS — F419 Anxiety disorder, unspecified: Secondary | ICD-10-CM | POA: Diagnosis not present

## 2019-08-19 DIAGNOSIS — R601 Generalized edema: Secondary | ICD-10-CM | POA: Diagnosis not present

## 2019-08-19 DIAGNOSIS — K5900 Constipation, unspecified: Secondary | ICD-10-CM | POA: Diagnosis not present

## 2019-08-19 DIAGNOSIS — Z515 Encounter for palliative care: Secondary | ICD-10-CM | POA: Diagnosis not present

## 2019-08-19 DIAGNOSIS — R110 Nausea: Secondary | ICD-10-CM | POA: Diagnosis not present

## 2019-08-19 DIAGNOSIS — K7031 Alcoholic cirrhosis of liver with ascites: Secondary | ICD-10-CM | POA: Diagnosis not present

## 2019-08-19 DIAGNOSIS — J9601 Acute respiratory failure with hypoxia: Secondary | ICD-10-CM | POA: Diagnosis not present

## 2019-08-19 DIAGNOSIS — Z7189 Other specified counseling: Secondary | ICD-10-CM | POA: Diagnosis not present

## 2019-08-19 NOTE — Progress Notes (Signed)
Radiation Oncology         [phone] ________________________________  Outpatient Follow Up - Conducted via telephone due to current COVID-19 concerns for limiting patient exposure  I spoke with the patient to conduct this consult visit via telephone to spare the patient unnecessary potential exposure in the healthcare setting during the current COVID-19 pandemic. The patient was notified in advance and was offered a [facility] meeting to allow for face to face communication but unfortunately reported that they did not have the appropriate resources/technology to support such a visit and instead preferred to proceed with a telephone visit.     Name: [name]        MRN: [id number]  Date of Service: 08/18/2019 DOB: 08-Aug-1940  GU:YQIHK, Jenny Reichmann, MD  Shon Baton, MD     REFERRING PHYSICIAN: Shon Baton, MD   DIAGNOSIS: The encounter diagnosis was Lung nodule.   HISTORY OF PRESENT ILLNESS: [name] is a 79 y.o. male originally seen at the request of Dr. Roxan Hockey for lung nodules. A CT scan dating back to February 2018 has shown bilateral pulmonary nodules.  He was followed up with repeat imaging in August 2018 and those nodules had been unchanged.  He recently had a CT scan on 05/18/2018 that showed a persistent change in the left upper lobe and right lower lobe.  There was mild progression of the central solid component associated with the nodules, and the right lower lobe measured 13 mm, and the central component was 9 mm previously 6. The left upper lobe nodule measured 11 mm in the central solid component was 9 mm.  He underwent pet imaging on 06/24/2018 that revealed the right lower lobe 13 mm nodule having an SUV of 1.13, on remote exam in comparison in September 2014 it had been 5.4 mm.  The left upper lobe nodule was 12 mm with an SUV of .99.  He underwent bronchoscopy with Dr. Roxan Hockey on 07/26/2018 with endobronchial navigation his right lower lobe was sampled with brushings and FNA.   The left upper lobe was also sampled, and his cytologic and pathologic findings were negative for malignancy.  Of note fiducial markers were placed.  Dr. Roxan Hockey recommended proceeding with repeat CT in 3 months time, and the patient wanted a second opinion.  We discussed the findings and work-up thus far, and would recommend the same as Dr. Roxan Hockey and since his scans have shown stability. 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 He also has a chronic pericarditis and was just released from the hospital yesterday at [facility]. There he had a thoracentesis and also a CT chest without contrast. The CT on 08/16/19 revealed a large left effusion, a right pneumothorax which required chest tube placement, ascites and stigmata of cirrhosis. Due to pleural fluid neither of the nodules we've been following were seen. I did call Dr. Dorene Grebe in radiology at [facility] and he did confirm that any nodules were obscured by the fluid. I called the patient to review but could not communicate well due to his hearing loss. I was however able to speak with his son [name].   PREVIOUS RADIATION THERAPY: No   PAST MEDICAL HISTORY:  Past Medical History:  Diagnosis Date  . Acute respiratory failure with hypoxia ([place]) 03/09/2019  . Adenomatous polyps 06/2004  . Agent  orange exposure   . Alcoholic cirrhosis of liver with ascites ([place])   . Alcoholism ([place])   . Allergy   . Anxiety    PTSD- no meds  . Atrial fibrillation ([place])   . Atrial flutter ([place])    had ablation   . CAD in native artery    cath 04/2019: 99% stenosis of ostium of OM>> small vessel>> RX tx  . Chronic constrictive pericarditis    a. cath 04/2019 - moderate elevated L heart pressure; severely edevated R heart adn pulomary artery pressures   .  Chronic kidney disease    "beginnings of kidney failure"- 3-4 years ago  . Depression   . Diabetes mellitus without complication (H[facility])    no meds  . Diverticulosis   . GERD (gastroesophageal reflux disease)   . Hemorrhoids   . Hiatal hernia   . HOH (hard of hearing)   . Hx of hernia repair   . Hyperlipidemia   . Hypertension   . Hyponatremia 08/2018  . Hyponatremia 06/2019  . Iron deficiency anemia   . Lung nodules    bilateral  . Pedal edema   . Pneumonia    06-2016  . PTSD (post-traumatic stress disorder)   . Substance abuse ([place])    alcohol use  . Ulcer   . Varicose veins        PAST SURGICAL HISTORY: Past Surgical History:  Procedure Laterality Date  . ATRIAL ABLATION SURGERY    . COLONOSCOPY    . FUDUCIAL PLACEMENT N/A 07/26/2018   Procedure: PLACEMENT OF FUDUCIAL;  Surgeon: [name], MD;  Location: [place];  Service: Thoracic;  Laterality: N/A;  . IR PARACENTESIS  10/28/2016  . IR PARACENTESIS  11/12/2016  . IR PARACENTESIS  12/02/2016  . IR PARACENTESIS  12/10/2016  . IR PARACENTESIS  12/30/2016  . IR PARACENTESIS  01/27/2017  . IR PARACENTESIS  09/09/2017  . IR PARACENTESIS  03/04/2018  . IR PARACENTESIS  04/08/2018  . IR PARACENTESIS  05/10/2018  . IR PARACENTESIS  05/21/2018  . IR PARACENTESIS  06/16/2018  . IR PARACENTESIS  07/23/2018  . IR PARACENTESIS  10/06/2018  . IR PARACENTESIS  10/28/2018  . IR PARACENTESIS  01/12/2019  . IR PARACENTESIS  01/28/2019  . IR PARACENTESIS  02/04/2019  . IR PARACENTESIS  02/15/2019  . IR PARACENTESIS  02/28/2019  . IR PARACENTESIS  03/22/2019  . IR PARACENTESIS  04/04/2019  . IR PARACENTESIS  04/15/2019  . IR PARACENTESIS  07/22/2019  . IR PARACENTESIS  08/03/2019  . IR RADIOLOGIST EVAL & MGMT  06/22/2018  . IR THORACENTESIS ASP PLEURAL SPACE W/IMG GUIDE  03/09/2019  . IR THORACENTESIS ASP PLEURAL SPACE W/IMG GUIDE  03/10/2019  . POLYPECTOMY    . RIGHT/LEFT HEART CATH AND CORONARY ANGIOGRAPHY N/A 04/21/2019   Procedure:  RIGHT/LEFT HEART CATH AND CORONARY ANGIOGRAPHY;  Surgeon: Nelva Bush, MD;  Location: [facility];  Service: Cardiovascular;  Laterality: N/A;  . THORACENTESIS  03/09/2019  . TONSILLECTOMY    . UMBILICAL HERNIA REPAIR    . VARICOSE VEIN SURGERY     x4  . VIDEO BRONCHOSCOPY WITH ENDOBRONCHIAL NAVIGATION N/A 07/26/2018   Procedure: VIDEO BRONCHOSCOPY WITH ENDOBRONCHIAL NAVIGATION;  Surgeon: [name], MD;  Location: [facility];  Service: Thoracic;  Laterality: N/A;     FAMILY HISTORY:  Family History  Problem Relation Age of Onset  . Colon polyps Brother   . Prostate cancer Brother   . Heart disease Father  rheumatic fever as child  . Stroke Mother   . Colon cancer Neg Hx   . Esophageal cancer Neg Hx   . Rectal cancer Neg Hx   . Stomach cancer Neg Hx      SOCIAL HISTORY:  reports that he has never smoked. He has never used smokeless tobacco. He reports current alcohol use. He reports that he does not use drugs.   ALLERGIES: Lisinopril, Lipitor [atorvastatin], and Sulfonamide derivatives   MEDICATIONS:  Current Outpatient Medications  Medication Sig Dispense Refill  . escitalopram (LEXAPRO) 10 MG tablet Take 10 mg by mouth daily.    . folic acid (FOLVITE) 1 MG tablet Take 1 tablet (1 mg total) by mouth daily. (Patient taking differently: Take 1 mg by mouth at bedtime. ) 30 tablet 1  . furosemide (LASIX) 80 MG tablet Take 40 mg by mouth 2 (two) times daily.    . Guaifenesin (MUCINEX MAXIMUM STRENGTH) 1200 MG TB12 Take 1,200 mg by mouth daily as needed (congestion).    Marland Kitchen linaclotide (LINZESS) 145 MCG CAPS capsule Take 145 mcg by mouth as needed (constipation).     . LORazepam (ATIVAN) 0.5 MG tablet Take 0.5 mg by mouth at bedtime as needed for sleep.     . Multiple Vitamin (MULTIVITAMIN WITH MINERALS) TABS tablet Take 1 tablet by mouth daily.    Marland Kitchen omeprazole (PRILOSEC) 20 MG capsule Take 1 capsule (20 mg total) by mouth 2 (two) times daily. (Patient taking  differently: Take 20 mg by mouth daily as needed (heartburn). ) 60 capsule 5  . potassium chloride (KLOR-CON) 10 MEQ tablet Take 2 tablets (20 mEq total) by mouth daily. Take extra 4 tabs on Mon and Friday (Patient taking differently: Take 10 mEq by mouth. ) 100 tablet 5  . Skin Protectants, Misc. (EUCERIN) cream Apply 1 application topically as needed (irritated skin).    . sodium chloride (OCEAN) 0.65 % SOLN nasal spray Place 1 spray into both nostrils as needed for congestion.    . sodium chloride 1 g tablet Take 2 tablets (2 g total) by mouth 2 (two) times daily with a meal. 28 tablet 0  . thiamine 100 MG tablet Take 1 tablet (100 mg total) by mouth daily. 30 tablet 1  . traZODone (DESYREL) 50 MG tablet Take 50 mg by mouth at bedtime.     No current facility-administered medications for this encounter.     REVIEW OF SYSTEMS: Per the patient's son the patient continues to have difficulty hearing and is working with a new set of hearing aides. He continues to drink alcohol but is hoping to quit soon. He also continues to have episodes of shortness of breath and abdominal fullness at times leading up to thoracenteses and paracenteses. No other complaints are noted.    PHYSICAL EXAM:  Unable to assess due to encounter type.   ECOG = 1  0 - Asymptomatic (Fully active, able to carry on all predisease activities without restriction)  1 - Symptomatic but completely ambulatory (Restricted in physically strenuous activity but ambulatory and able to carry out work of a light or sedentary nature. For example, light housework, office work)  2 - Symptomatic, <50% in bed during the day (Ambulatory and capable of all self care but unable to carry out any work activities. Up and about more than 50% of waking hours)  3 - Symptomatic, >50% in bed, but not bedbound (Capable of only limited self-care, confined to bed or chair 50% or more of waking  hours)  4 - Bedbound (Completely disabled. Cannot carry  on any self-care. Totally confined to bed or chair)  5 - Death   Eustace Pen MM, Creech RH, Tormey DC, et al. [phone]). "Toxicity and response criteria of the [facility]". [place] Oncol. 5 (6): 649-55    LABORATORY DATA:  Lab Results  Component Value Date   WBC 5.7 07/04/2019   HGB 13.2 07/04/2019   HCT 37.4 (L) 07/04/2019   MCV 87.8 07/04/2019   PLT 207 07/04/2019   Lab Results  Component Value Date   NA 123 (L) 07/06/2019   K 3.9 07/06/2019   CL 87 (L) 07/06/2019   CO2 23 07/06/2019   Lab Results  Component Value Date   ALT 27 07/04/2019   AST 31 07/04/2019   ALKPHOS 154 (H) 07/04/2019   BILITOT 2.5 (H) 07/04/2019      RADIOGRAPHY: IR Paracentesis  Result Date: 08/03/2019 INDICATION: Patient with history of alcoholic cirrhosis, recurrent ascites. Request is made for therapeutic paracentesis of up to 4.5 L maximum. EXAM: ULTRASOUND GUIDED THERAPEUTIC PARACENTESIS MEDICATIONS: 10 mL 1% lidocaine COMPLICATIONS: None immediate. PROCEDURE: Informed written consent was obtained from the patient after a discussion of the risks, benefits and alternatives to treatment. A timeout was performed prior to the initiation of the procedure. Initial ultrasound scanning demonstrates a moderate amount of ascites within the left lateral abdomen. The left lateral abdomen was prepped and draped in the usual sterile fashion. 1% lidocaine was used for local anesthesia. Following this, a 6 Fr Safe-T-Centesis catheter was introduced. An ultrasound image was saved for documentation purposes. The paracentesis was performed. The catheter was removed and a dressing was applied. The patient tolerated the procedure well without immediate post procedural complication. Patient received post-procedure intravenous albumin; see nursing notes for details. FINDINGS: A total of approximately 2.7 liters of yellow fluid was removed. IMPRESSION: Successful ultrasound-guided therapeutic paracentesis  yielding 2.7 liters of peritoneal fluid. Read by: Brynda Greathouse PA-C Electronically Signed   By: Markus Daft M.D.   On: 08/03/2019 12:46   IR Paracentesis  Result Date: 07/22/2019 INDICATION: Patient with history of alcoholic cirrhosis with recurrent ascites presents for therapeutic paracentesis EXAM: ULTRASOUND GUIDED THERAPEUTIC PARACENTESIS MEDICATIONS: Lidocaine 1% 10 mL COMPLICATIONS: None immediate. PROCEDURE: Informed written consent was obtained from the patient after a discussion of the risks, benefits and alternatives to treatment. A timeout was performed prior to the initiation of the procedure. Initial ultrasound scanning demonstrates a small amount of ascites within the right lower abdominal quadrant. The right lower abdomen was prepped and draped in the usual sterile fashion. 1% lidocaine was used for local anesthesia. Following this, a 6 Fr Safe-T-Centesis catheter was introduced. An ultrasound image was saved for documentation purposes. The paracentesis was performed. The catheter was removed and a dressing was applied. The patient tolerated the procedure well without immediate post procedural complication. FINDINGS: A total of approximately 1.8 L of straw-colored fluid was removed. IMPRESSION: Successful ultrasound-guided therapeutic paracentesis yielding 1.8 liters of peritoneal fluid. Read by Rushie Nyhan NP Electronically Signed   By: Lucrezia Europe M.D.   On: 07/22/2019 10:46       IMPRESSION/PLAN: 1. Bilateral pulmonary nodules of unknown significance. Given the inability to completely evaluate his nodules on CT, Corene Cornea his son and I spoke about reimaging in 2-3 months if he is clinically doing well. I'm concerned that his other chronic illnesses pose a greater threat to him than these small nodules that have not be  proven to be malignant. However, if these were to change on subsequent imaging and he was clinically thriving, it would make sense to me to follow these and possibly treat  them with radiotherapy depending on the changes over time. Corene Cornea is in agreement and will share this with his father since we were limited by his ability to hear me when I called the patient. They will call sooner however if they have further questions or concerns. 2. Chronic cirrhosis and pericarditis. These illnesses are certainly more pressing that the nodules that have been relatively stable for several years. We will follow along with his status with these conditions as we consider how to manage these nodules.   Given current concerns for patient exposure during the COVID-19 pandemic, this encounter was conducted via telephone.  The patient has provided two factor identification and has given verbal consent for this type of encounter and has been advised to only accept a meeting of this type in a secure network environment. The time spent during this encounter was 30 minutes including preparation, discussion, and coordination of the patient's care. The attendants for this meeting include Hayden Pedro  and [name]. His son Corene Cornea had to be called separately to complete the discussion. During the encounter,  Hayden Pedro was located at [facility] Radiation Oncology Department.  [name] was located at home. Drexel Ivey was driving on his way to see his dad.    Carola Rhine, PAC

## 2019-08-20 DIAGNOSIS — R601 Generalized edema: Secondary | ICD-10-CM | POA: Diagnosis not present

## 2019-08-20 DIAGNOSIS — K7031 Alcoholic cirrhosis of liver with ascites: Secondary | ICD-10-CM | POA: Diagnosis not present

## 2019-08-20 DIAGNOSIS — R0602 Shortness of breath: Secondary | ICD-10-CM | POA: Diagnosis not present

## 2019-08-20 DIAGNOSIS — J90 Pleural effusion, not elsewhere classified: Secondary | ICD-10-CM | POA: Diagnosis not present

## 2019-08-20 DIAGNOSIS — J9601 Acute respiratory failure with hypoxia: Secondary | ICD-10-CM | POA: Diagnosis not present

## 2019-08-20 DIAGNOSIS — J939 Pneumothorax, unspecified: Secondary | ICD-10-CM | POA: Diagnosis not present

## 2019-08-21 DIAGNOSIS — J9601 Acute respiratory failure with hypoxia: Secondary | ICD-10-CM | POA: Diagnosis not present

## 2019-08-21 DIAGNOSIS — K7031 Alcoholic cirrhosis of liver with ascites: Secondary | ICD-10-CM | POA: Diagnosis not present

## 2019-08-21 DIAGNOSIS — R601 Generalized edema: Secondary | ICD-10-CM | POA: Diagnosis not present

## 2019-08-21 DIAGNOSIS — J90 Pleural effusion, not elsewhere classified: Secondary | ICD-10-CM | POA: Diagnosis not present

## 2019-08-22 ENCOUNTER — Ambulatory Visit: Payer: Medicare HMO | Admitting: Radiation Oncology

## 2019-08-22 DIAGNOSIS — R0603 Acute respiratory distress: Secondary | ICD-10-CM | POA: Diagnosis not present

## 2019-08-22 DIAGNOSIS — Z4682 Encounter for fitting and adjustment of non-vascular catheter: Secondary | ICD-10-CM | POA: Diagnosis not present

## 2019-08-22 DIAGNOSIS — Z7189 Other specified counseling: Secondary | ICD-10-CM | POA: Diagnosis not present

## 2019-08-22 DIAGNOSIS — R110 Nausea: Secondary | ICD-10-CM | POA: Diagnosis not present

## 2019-08-22 DIAGNOSIS — J90 Pleural effusion, not elsewhere classified: Secondary | ICD-10-CM | POA: Diagnosis not present

## 2019-08-22 DIAGNOSIS — R601 Generalized edema: Secondary | ICD-10-CM | POA: Diagnosis not present

## 2019-08-22 DIAGNOSIS — K5900 Constipation, unspecified: Secondary | ICD-10-CM | POA: Diagnosis not present

## 2019-08-22 DIAGNOSIS — R918 Other nonspecific abnormal finding of lung field: Secondary | ICD-10-CM | POA: Diagnosis not present

## 2019-08-22 DIAGNOSIS — J9601 Acute respiratory failure with hypoxia: Secondary | ICD-10-CM | POA: Diagnosis not present

## 2019-08-22 DIAGNOSIS — F419 Anxiety disorder, unspecified: Secondary | ICD-10-CM | POA: Diagnosis not present

## 2019-08-22 DIAGNOSIS — R0602 Shortness of breath: Secondary | ICD-10-CM | POA: Diagnosis not present

## 2019-08-22 DIAGNOSIS — Z515 Encounter for palliative care: Secondary | ICD-10-CM | POA: Diagnosis not present

## 2019-08-22 DIAGNOSIS — K7031 Alcoholic cirrhosis of liver with ascites: Secondary | ICD-10-CM | POA: Diagnosis not present

## 2019-08-22 DIAGNOSIS — J9383 Other pneumothorax: Secondary | ICD-10-CM | POA: Diagnosis not present

## 2019-08-22 MED ORDER — FUROSEMIDE 10 MG/ML IJ SOLN
40.00 | INTRAMUSCULAR | Status: DC
Start: 2019-08-22 — End: 2019-08-22

## 2019-08-22 MED ORDER — SPIRONOLACTONE 25 MG PO TABS
50.00 | ORAL_TABLET | ORAL | Status: DC
Start: 2019-08-23 — End: 2019-08-22

## 2019-08-22 MED ORDER — GENERIC EXTERNAL MEDICATION
Status: DC
Start: ? — End: 2019-08-22

## 2019-08-23 DIAGNOSIS — Z515 Encounter for palliative care: Secondary | ICD-10-CM | POA: Diagnosis not present

## 2019-08-23 DIAGNOSIS — J9383 Other pneumothorax: Secondary | ICD-10-CM | POA: Diagnosis not present

## 2019-08-23 DIAGNOSIS — I10 Essential (primary) hypertension: Secondary | ICD-10-CM | POA: Diagnosis not present

## 2019-08-23 DIAGNOSIS — R110 Nausea: Secondary | ICD-10-CM | POA: Diagnosis not present

## 2019-08-23 DIAGNOSIS — I5032 Chronic diastolic (congestive) heart failure: Secondary | ICD-10-CM | POA: Diagnosis not present

## 2019-08-23 DIAGNOSIS — J90 Pleural effusion, not elsewhere classified: Secondary | ICD-10-CM | POA: Diagnosis not present

## 2019-08-23 DIAGNOSIS — K7031 Alcoholic cirrhosis of liver with ascites: Secondary | ICD-10-CM | POA: Diagnosis not present

## 2019-08-23 DIAGNOSIS — R0602 Shortness of breath: Secondary | ICD-10-CM | POA: Diagnosis not present

## 2019-08-23 DIAGNOSIS — K5900 Constipation, unspecified: Secondary | ICD-10-CM | POA: Diagnosis not present

## 2019-08-23 DIAGNOSIS — F419 Anxiety disorder, unspecified: Secondary | ICD-10-CM | POA: Diagnosis not present

## 2019-08-23 DIAGNOSIS — Z7189 Other specified counseling: Secondary | ICD-10-CM | POA: Diagnosis not present

## 2019-08-23 DIAGNOSIS — E871 Hypo-osmolality and hyponatremia: Secondary | ICD-10-CM | POA: Diagnosis not present

## 2019-08-23 DIAGNOSIS — R0603 Acute respiratory distress: Secondary | ICD-10-CM | POA: Diagnosis not present

## 2019-08-23 DIAGNOSIS — K219 Gastro-esophageal reflux disease without esophagitis: Secondary | ICD-10-CM | POA: Diagnosis not present

## 2019-08-24 DIAGNOSIS — K5900 Constipation, unspecified: Secondary | ICD-10-CM | POA: Diagnosis not present

## 2019-08-24 DIAGNOSIS — R52 Pain, unspecified: Secondary | ICD-10-CM | POA: Diagnosis not present

## 2019-08-24 DIAGNOSIS — R0603 Acute respiratory distress: Secondary | ICD-10-CM | POA: Diagnosis not present

## 2019-08-24 DIAGNOSIS — G4700 Insomnia, unspecified: Secondary | ICD-10-CM | POA: Diagnosis not present

## 2019-08-24 DIAGNOSIS — Z515 Encounter for palliative care: Secondary | ICD-10-CM | POA: Diagnosis not present

## 2019-08-24 DIAGNOSIS — E871 Hypo-osmolality and hyponatremia: Secondary | ICD-10-CM | POA: Diagnosis not present

## 2019-08-24 DIAGNOSIS — I5032 Chronic diastolic (congestive) heart failure: Secondary | ICD-10-CM | POA: Diagnosis not present

## 2019-08-24 DIAGNOSIS — I10 Essential (primary) hypertension: Secondary | ICD-10-CM | POA: Diagnosis not present

## 2019-08-24 DIAGNOSIS — R0602 Shortness of breath: Secondary | ICD-10-CM | POA: Diagnosis not present

## 2019-08-24 DIAGNOSIS — J90 Pleural effusion, not elsewhere classified: Secondary | ICD-10-CM | POA: Diagnosis not present

## 2019-08-24 DIAGNOSIS — Z7189 Other specified counseling: Secondary | ICD-10-CM | POA: Diagnosis not present

## 2019-08-24 DIAGNOSIS — K219 Gastro-esophageal reflux disease without esophagitis: Secondary | ICD-10-CM | POA: Diagnosis not present

## 2019-08-24 DIAGNOSIS — F419 Anxiety disorder, unspecified: Secondary | ICD-10-CM | POA: Diagnosis not present

## 2019-08-24 DIAGNOSIS — K7031 Alcoholic cirrhosis of liver with ascites: Secondary | ICD-10-CM | POA: Diagnosis not present

## 2019-08-25 DIAGNOSIS — K219 Gastro-esophageal reflux disease without esophagitis: Secondary | ICD-10-CM | POA: Diagnosis not present

## 2019-08-25 DIAGNOSIS — E871 Hypo-osmolality and hyponatremia: Secondary | ICD-10-CM | POA: Diagnosis not present

## 2019-08-25 DIAGNOSIS — R0603 Acute respiratory distress: Secondary | ICD-10-CM | POA: Diagnosis not present

## 2019-08-25 DIAGNOSIS — Z515 Encounter for palliative care: Secondary | ICD-10-CM | POA: Diagnosis not present

## 2019-08-25 DIAGNOSIS — K5900 Constipation, unspecified: Secondary | ICD-10-CM | POA: Diagnosis not present

## 2019-08-25 DIAGNOSIS — J90 Pleural effusion, not elsewhere classified: Secondary | ICD-10-CM | POA: Diagnosis not present

## 2019-08-25 DIAGNOSIS — F419 Anxiety disorder, unspecified: Secondary | ICD-10-CM | POA: Diagnosis not present

## 2019-08-25 DIAGNOSIS — R0602 Shortness of breath: Secondary | ICD-10-CM | POA: Diagnosis not present

## 2019-08-25 DIAGNOSIS — I5032 Chronic diastolic (congestive) heart failure: Secondary | ICD-10-CM | POA: Diagnosis not present

## 2019-08-25 DIAGNOSIS — I10 Essential (primary) hypertension: Secondary | ICD-10-CM | POA: Diagnosis not present

## 2019-08-25 DIAGNOSIS — K7031 Alcoholic cirrhosis of liver with ascites: Secondary | ICD-10-CM | POA: Diagnosis not present

## 2019-08-25 DIAGNOSIS — R601 Generalized edema: Secondary | ICD-10-CM | POA: Diagnosis not present

## 2019-08-25 MED ORDER — GENERIC EXTERNAL MEDICATION
Status: DC
Start: ? — End: 2019-08-25

## 2019-08-25 MED ORDER — CLOTRIMAZOLE 1 % EX CREA
TOPICAL_CREAM | CUTANEOUS | Status: DC
Start: 2019-08-25 — End: 2019-08-25

## 2019-08-25 MED ORDER — GENERIC EXTERNAL MEDICATION
150.00 | Status: DC
Start: 2019-08-26 — End: 2019-08-25

## 2019-08-25 MED ORDER — FUROSEMIDE 10 MG/ML IJ SOLN
40.00 | INTRAMUSCULAR | Status: DC
Start: 2019-08-25 — End: 2019-08-25

## 2019-08-25 MED ORDER — LACTULOSE 10 GM/15ML PO SOLN
20.00 | ORAL | Status: DC
Start: 2019-08-25 — End: 2019-08-25

## 2019-08-25 MED ORDER — LINACLOTIDE 145 MCG PO CAPS
145.00 | ORAL_CAPSULE | ORAL | Status: DC
Start: 2019-08-26 — End: 2019-08-25

## 2019-08-27 MED ORDER — GENERIC EXTERNAL MEDICATION
Status: DC
Start: ? — End: 2019-08-27

## 2019-08-28 DIAGNOSIS — I5032 Chronic diastolic (congestive) heart failure: Secondary | ICD-10-CM | POA: Diagnosis not present

## 2019-08-28 DIAGNOSIS — G4700 Insomnia, unspecified: Secondary | ICD-10-CM | POA: Diagnosis not present

## 2019-08-28 DIAGNOSIS — I311 Chronic constrictive pericarditis: Secondary | ICD-10-CM | POA: Diagnosis not present

## 2019-08-28 DIAGNOSIS — K7290 Hepatic failure, unspecified without coma: Secondary | ICD-10-CM | POA: Diagnosis not present

## 2019-08-28 DIAGNOSIS — K7031 Alcoholic cirrhosis of liver with ascites: Secondary | ICD-10-CM | POA: Diagnosis not present

## 2019-08-28 DIAGNOSIS — I482 Chronic atrial fibrillation, unspecified: Secondary | ICD-10-CM | POA: Diagnosis not present

## 2019-08-28 DIAGNOSIS — K219 Gastro-esophageal reflux disease without esophagitis: Secondary | ICD-10-CM | POA: Diagnosis not present

## 2019-08-28 DIAGNOSIS — F411 Generalized anxiety disorder: Secondary | ICD-10-CM | POA: Diagnosis not present

## 2019-08-28 DIAGNOSIS — I110 Hypertensive heart disease with heart failure: Secondary | ICD-10-CM | POA: Diagnosis not present

## 2019-08-29 DIAGNOSIS — K7290 Hepatic failure, unspecified without coma: Secondary | ICD-10-CM | POA: Diagnosis not present

## 2019-08-29 DIAGNOSIS — K7031 Alcoholic cirrhosis of liver with ascites: Secondary | ICD-10-CM | POA: Diagnosis not present

## 2019-08-29 DIAGNOSIS — I110 Hypertensive heart disease with heart failure: Secondary | ICD-10-CM | POA: Diagnosis not present

## 2019-08-29 DIAGNOSIS — G4700 Insomnia, unspecified: Secondary | ICD-10-CM | POA: Diagnosis not present

## 2019-08-29 DIAGNOSIS — I482 Chronic atrial fibrillation, unspecified: Secondary | ICD-10-CM | POA: Diagnosis not present

## 2019-08-29 DIAGNOSIS — I311 Chronic constrictive pericarditis: Secondary | ICD-10-CM | POA: Diagnosis not present

## 2019-08-29 DIAGNOSIS — F411 Generalized anxiety disorder: Secondary | ICD-10-CM | POA: Diagnosis not present

## 2019-08-29 DIAGNOSIS — I5032 Chronic diastolic (congestive) heart failure: Secondary | ICD-10-CM | POA: Diagnosis not present

## 2019-08-29 DIAGNOSIS — K219 Gastro-esophageal reflux disease without esophagitis: Secondary | ICD-10-CM | POA: Diagnosis not present

## 2019-08-31 DIAGNOSIS — G4700 Insomnia, unspecified: Secondary | ICD-10-CM | POA: Diagnosis not present

## 2019-08-31 DIAGNOSIS — K7031 Alcoholic cirrhosis of liver with ascites: Secondary | ICD-10-CM | POA: Diagnosis not present

## 2019-08-31 DIAGNOSIS — I482 Chronic atrial fibrillation, unspecified: Secondary | ICD-10-CM | POA: Diagnosis not present

## 2019-08-31 DIAGNOSIS — I5032 Chronic diastolic (congestive) heart failure: Secondary | ICD-10-CM | POA: Diagnosis not present

## 2019-08-31 DIAGNOSIS — I110 Hypertensive heart disease with heart failure: Secondary | ICD-10-CM | POA: Diagnosis not present

## 2019-08-31 DIAGNOSIS — K219 Gastro-esophageal reflux disease without esophagitis: Secondary | ICD-10-CM | POA: Diagnosis not present

## 2019-08-31 DIAGNOSIS — F411 Generalized anxiety disorder: Secondary | ICD-10-CM | POA: Diagnosis not present

## 2019-08-31 DIAGNOSIS — I311 Chronic constrictive pericarditis: Secondary | ICD-10-CM | POA: Diagnosis not present

## 2019-08-31 DIAGNOSIS — K7290 Hepatic failure, unspecified without coma: Secondary | ICD-10-CM | POA: Diagnosis not present

## 2019-09-01 DIAGNOSIS — K219 Gastro-esophageal reflux disease without esophagitis: Secondary | ICD-10-CM | POA: Diagnosis not present

## 2019-09-01 DIAGNOSIS — I5032 Chronic diastolic (congestive) heart failure: Secondary | ICD-10-CM | POA: Diagnosis not present

## 2019-09-01 DIAGNOSIS — I110 Hypertensive heart disease with heart failure: Secondary | ICD-10-CM | POA: Diagnosis not present

## 2019-09-01 DIAGNOSIS — I311 Chronic constrictive pericarditis: Secondary | ICD-10-CM | POA: Diagnosis not present

## 2019-09-01 DIAGNOSIS — K7290 Hepatic failure, unspecified without coma: Secondary | ICD-10-CM | POA: Diagnosis not present

## 2019-09-01 DIAGNOSIS — G4700 Insomnia, unspecified: Secondary | ICD-10-CM | POA: Diagnosis not present

## 2019-09-01 DIAGNOSIS — F411 Generalized anxiety disorder: Secondary | ICD-10-CM | POA: Diagnosis not present

## 2019-09-01 DIAGNOSIS — K7031 Alcoholic cirrhosis of liver with ascites: Secondary | ICD-10-CM | POA: Diagnosis not present

## 2019-09-01 DIAGNOSIS — I482 Chronic atrial fibrillation, unspecified: Secondary | ICD-10-CM | POA: Diagnosis not present

## 2019-09-02 DIAGNOSIS — E871 Hypo-osmolality and hyponatremia: Secondary | ICD-10-CM | POA: Diagnosis not present

## 2019-09-02 DIAGNOSIS — I110 Hypertensive heart disease with heart failure: Secondary | ICD-10-CM | POA: Diagnosis not present

## 2019-09-02 DIAGNOSIS — J90 Pleural effusion, not elsewhere classified: Secondary | ICD-10-CM | POA: Diagnosis not present

## 2019-09-02 DIAGNOSIS — R0600 Dyspnea, unspecified: Secondary | ICD-10-CM | POA: Diagnosis not present

## 2019-09-02 DIAGNOSIS — I130 Hypertensive heart and chronic kidney disease with heart failure and stage 1 through stage 4 chronic kidney disease, or unspecified chronic kidney disease: Secondary | ICD-10-CM | POA: Diagnosis not present

## 2019-09-02 DIAGNOSIS — I482 Chronic atrial fibrillation, unspecified: Secondary | ICD-10-CM | POA: Diagnosis not present

## 2019-09-02 DIAGNOSIS — G4700 Insomnia, unspecified: Secondary | ICD-10-CM | POA: Diagnosis not present

## 2019-09-02 DIAGNOSIS — N1831 Chronic kidney disease, stage 3a: Secondary | ICD-10-CM | POA: Diagnosis not present

## 2019-09-02 DIAGNOSIS — K7460 Unspecified cirrhosis of liver: Secondary | ICD-10-CM | POA: Diagnosis not present

## 2019-09-02 DIAGNOSIS — F411 Generalized anxiety disorder: Secondary | ICD-10-CM | POA: Diagnosis not present

## 2019-09-02 DIAGNOSIS — I5032 Chronic diastolic (congestive) heart failure: Secondary | ICD-10-CM | POA: Diagnosis not present

## 2019-09-02 DIAGNOSIS — F1010 Alcohol abuse, uncomplicated: Secondary | ICD-10-CM | POA: Diagnosis not present

## 2019-09-02 DIAGNOSIS — K7031 Alcoholic cirrhosis of liver with ascites: Secondary | ICD-10-CM | POA: Diagnosis not present

## 2019-09-02 DIAGNOSIS — I5033 Acute on chronic diastolic (congestive) heart failure: Secondary | ICD-10-CM | POA: Diagnosis not present

## 2019-09-02 DIAGNOSIS — K7290 Hepatic failure, unspecified without coma: Secondary | ICD-10-CM | POA: Diagnosis not present

## 2019-09-02 DIAGNOSIS — K219 Gastro-esophageal reflux disease without esophagitis: Secondary | ICD-10-CM | POA: Diagnosis not present

## 2019-09-02 DIAGNOSIS — I311 Chronic constrictive pericarditis: Secondary | ICD-10-CM | POA: Diagnosis not present

## 2019-09-05 DIAGNOSIS — I482 Chronic atrial fibrillation, unspecified: Secondary | ICD-10-CM | POA: Diagnosis not present

## 2019-09-05 DIAGNOSIS — K219 Gastro-esophageal reflux disease without esophagitis: Secondary | ICD-10-CM | POA: Diagnosis not present

## 2019-09-05 DIAGNOSIS — I110 Hypertensive heart disease with heart failure: Secondary | ICD-10-CM | POA: Diagnosis not present

## 2019-09-05 DIAGNOSIS — K7290 Hepatic failure, unspecified without coma: Secondary | ICD-10-CM | POA: Diagnosis not present

## 2019-09-05 DIAGNOSIS — F411 Generalized anxiety disorder: Secondary | ICD-10-CM | POA: Diagnosis not present

## 2019-09-05 DIAGNOSIS — G4700 Insomnia, unspecified: Secondary | ICD-10-CM | POA: Diagnosis not present

## 2019-09-05 DIAGNOSIS — K7031 Alcoholic cirrhosis of liver with ascites: Secondary | ICD-10-CM | POA: Diagnosis not present

## 2019-09-05 DIAGNOSIS — I311 Chronic constrictive pericarditis: Secondary | ICD-10-CM | POA: Diagnosis not present

## 2019-09-05 DIAGNOSIS — I5032 Chronic diastolic (congestive) heart failure: Secondary | ICD-10-CM | POA: Diagnosis not present

## 2019-09-06 DIAGNOSIS — K7290 Hepatic failure, unspecified without coma: Secondary | ICD-10-CM | POA: Diagnosis not present

## 2019-09-06 DIAGNOSIS — I110 Hypertensive heart disease with heart failure: Secondary | ICD-10-CM | POA: Diagnosis not present

## 2019-09-06 DIAGNOSIS — F411 Generalized anxiety disorder: Secondary | ICD-10-CM | POA: Diagnosis not present

## 2019-09-06 DIAGNOSIS — G4700 Insomnia, unspecified: Secondary | ICD-10-CM | POA: Diagnosis not present

## 2019-09-06 DIAGNOSIS — I482 Chronic atrial fibrillation, unspecified: Secondary | ICD-10-CM | POA: Diagnosis not present

## 2019-09-06 DIAGNOSIS — K7031 Alcoholic cirrhosis of liver with ascites: Secondary | ICD-10-CM | POA: Diagnosis not present

## 2019-09-06 DIAGNOSIS — K219 Gastro-esophageal reflux disease without esophagitis: Secondary | ICD-10-CM | POA: Diagnosis not present

## 2019-09-06 DIAGNOSIS — I5032 Chronic diastolic (congestive) heart failure: Secondary | ICD-10-CM | POA: Diagnosis not present

## 2019-09-06 DIAGNOSIS — I311 Chronic constrictive pericarditis: Secondary | ICD-10-CM | POA: Diagnosis not present

## 2019-09-07 DIAGNOSIS — I110 Hypertensive heart disease with heart failure: Secondary | ICD-10-CM | POA: Diagnosis not present

## 2019-09-07 DIAGNOSIS — I311 Chronic constrictive pericarditis: Secondary | ICD-10-CM | POA: Diagnosis not present

## 2019-09-07 DIAGNOSIS — K7031 Alcoholic cirrhosis of liver with ascites: Secondary | ICD-10-CM | POA: Diagnosis not present

## 2019-09-07 DIAGNOSIS — K7290 Hepatic failure, unspecified without coma: Secondary | ICD-10-CM | POA: Diagnosis not present

## 2019-09-07 DIAGNOSIS — G4700 Insomnia, unspecified: Secondary | ICD-10-CM | POA: Diagnosis not present

## 2019-09-07 DIAGNOSIS — F411 Generalized anxiety disorder: Secondary | ICD-10-CM | POA: Diagnosis not present

## 2019-09-07 DIAGNOSIS — I482 Chronic atrial fibrillation, unspecified: Secondary | ICD-10-CM | POA: Diagnosis not present

## 2019-09-07 DIAGNOSIS — I5032 Chronic diastolic (congestive) heart failure: Secondary | ICD-10-CM | POA: Diagnosis not present

## 2019-09-07 DIAGNOSIS — K219 Gastro-esophageal reflux disease without esophagitis: Secondary | ICD-10-CM | POA: Diagnosis not present

## 2019-09-08 DIAGNOSIS — F411 Generalized anxiety disorder: Secondary | ICD-10-CM | POA: Diagnosis not present

## 2019-09-08 DIAGNOSIS — K7290 Hepatic failure, unspecified without coma: Secondary | ICD-10-CM | POA: Diagnosis not present

## 2019-09-08 DIAGNOSIS — I482 Chronic atrial fibrillation, unspecified: Secondary | ICD-10-CM | POA: Diagnosis not present

## 2019-09-08 DIAGNOSIS — K7031 Alcoholic cirrhosis of liver with ascites: Secondary | ICD-10-CM | POA: Diagnosis not present

## 2019-09-08 DIAGNOSIS — K219 Gastro-esophageal reflux disease without esophagitis: Secondary | ICD-10-CM | POA: Diagnosis not present

## 2019-09-08 DIAGNOSIS — I110 Hypertensive heart disease with heart failure: Secondary | ICD-10-CM | POA: Diagnosis not present

## 2019-09-08 DIAGNOSIS — G4700 Insomnia, unspecified: Secondary | ICD-10-CM | POA: Diagnosis not present

## 2019-09-08 DIAGNOSIS — I311 Chronic constrictive pericarditis: Secondary | ICD-10-CM | POA: Diagnosis not present

## 2019-09-08 DIAGNOSIS — I5032 Chronic diastolic (congestive) heart failure: Secondary | ICD-10-CM | POA: Diagnosis not present

## 2019-09-09 DIAGNOSIS — G4700 Insomnia, unspecified: Secondary | ICD-10-CM | POA: Diagnosis not present

## 2019-09-09 DIAGNOSIS — K219 Gastro-esophageal reflux disease without esophagitis: Secondary | ICD-10-CM | POA: Diagnosis not present

## 2019-09-09 DIAGNOSIS — I110 Hypertensive heart disease with heart failure: Secondary | ICD-10-CM | POA: Diagnosis not present

## 2019-09-09 DIAGNOSIS — F411 Generalized anxiety disorder: Secondary | ICD-10-CM | POA: Diagnosis not present

## 2019-09-09 DIAGNOSIS — I482 Chronic atrial fibrillation, unspecified: Secondary | ICD-10-CM | POA: Diagnosis not present

## 2019-09-09 DIAGNOSIS — I5032 Chronic diastolic (congestive) heart failure: Secondary | ICD-10-CM | POA: Diagnosis not present

## 2019-09-09 DIAGNOSIS — K7290 Hepatic failure, unspecified without coma: Secondary | ICD-10-CM | POA: Diagnosis not present

## 2019-09-09 DIAGNOSIS — I311 Chronic constrictive pericarditis: Secondary | ICD-10-CM | POA: Diagnosis not present

## 2019-09-09 DIAGNOSIS — K7031 Alcoholic cirrhosis of liver with ascites: Secondary | ICD-10-CM | POA: Diagnosis not present

## 2019-09-12 DIAGNOSIS — K219 Gastro-esophageal reflux disease without esophagitis: Secondary | ICD-10-CM | POA: Diagnosis not present

## 2019-09-12 DIAGNOSIS — K7031 Alcoholic cirrhosis of liver with ascites: Secondary | ICD-10-CM | POA: Diagnosis not present

## 2019-09-12 DIAGNOSIS — K7290 Hepatic failure, unspecified without coma: Secondary | ICD-10-CM | POA: Diagnosis not present

## 2019-09-12 DIAGNOSIS — I482 Chronic atrial fibrillation, unspecified: Secondary | ICD-10-CM | POA: Diagnosis not present

## 2019-09-12 DIAGNOSIS — I110 Hypertensive heart disease with heart failure: Secondary | ICD-10-CM | POA: Diagnosis not present

## 2019-09-12 DIAGNOSIS — I311 Chronic constrictive pericarditis: Secondary | ICD-10-CM | POA: Diagnosis not present

## 2019-09-12 DIAGNOSIS — I5032 Chronic diastolic (congestive) heart failure: Secondary | ICD-10-CM | POA: Diagnosis not present

## 2019-09-12 DIAGNOSIS — G4700 Insomnia, unspecified: Secondary | ICD-10-CM | POA: Diagnosis not present

## 2019-09-12 DIAGNOSIS — F411 Generalized anxiety disorder: Secondary | ICD-10-CM | POA: Diagnosis not present

## 2019-09-13 DIAGNOSIS — I5032 Chronic diastolic (congestive) heart failure: Secondary | ICD-10-CM | POA: Diagnosis not present

## 2019-09-13 DIAGNOSIS — F411 Generalized anxiety disorder: Secondary | ICD-10-CM | POA: Diagnosis not present

## 2019-09-13 DIAGNOSIS — K7290 Hepatic failure, unspecified without coma: Secondary | ICD-10-CM | POA: Diagnosis not present

## 2019-09-13 DIAGNOSIS — K5900 Constipation, unspecified: Secondary | ICD-10-CM | POA: Diagnosis not present

## 2019-09-13 DIAGNOSIS — I110 Hypertensive heart disease with heart failure: Secondary | ICD-10-CM | POA: Diagnosis not present

## 2019-09-13 DIAGNOSIS — G4700 Insomnia, unspecified: Secondary | ICD-10-CM | POA: Diagnosis not present

## 2019-09-13 DIAGNOSIS — I311 Chronic constrictive pericarditis: Secondary | ICD-10-CM | POA: Diagnosis not present

## 2019-09-13 DIAGNOSIS — K219 Gastro-esophageal reflux disease without esophagitis: Secondary | ICD-10-CM | POA: Diagnosis not present

## 2019-09-13 DIAGNOSIS — K7031 Alcoholic cirrhosis of liver with ascites: Secondary | ICD-10-CM | POA: Diagnosis not present

## 2019-09-13 DIAGNOSIS — I482 Chronic atrial fibrillation, unspecified: Secondary | ICD-10-CM | POA: Diagnosis not present

## 2019-09-15 DIAGNOSIS — K219 Gastro-esophageal reflux disease without esophagitis: Secondary | ICD-10-CM | POA: Diagnosis not present

## 2019-09-15 DIAGNOSIS — K7290 Hepatic failure, unspecified without coma: Secondary | ICD-10-CM | POA: Diagnosis not present

## 2019-09-15 DIAGNOSIS — I482 Chronic atrial fibrillation, unspecified: Secondary | ICD-10-CM | POA: Diagnosis not present

## 2019-09-15 DIAGNOSIS — K7031 Alcoholic cirrhosis of liver with ascites: Secondary | ICD-10-CM | POA: Diagnosis not present

## 2019-09-15 DIAGNOSIS — I311 Chronic constrictive pericarditis: Secondary | ICD-10-CM | POA: Diagnosis not present

## 2019-09-15 DIAGNOSIS — I110 Hypertensive heart disease with heart failure: Secondary | ICD-10-CM | POA: Diagnosis not present

## 2019-09-15 DIAGNOSIS — I5032 Chronic diastolic (congestive) heart failure: Secondary | ICD-10-CM | POA: Diagnosis not present

## 2019-09-15 DIAGNOSIS — G4700 Insomnia, unspecified: Secondary | ICD-10-CM | POA: Diagnosis not present

## 2019-09-15 DIAGNOSIS — F411 Generalized anxiety disorder: Secondary | ICD-10-CM | POA: Diagnosis not present

## 2019-09-16 DIAGNOSIS — K219 Gastro-esophageal reflux disease without esophagitis: Secondary | ICD-10-CM | POA: Diagnosis not present

## 2019-09-16 DIAGNOSIS — F411 Generalized anxiety disorder: Secondary | ICD-10-CM | POA: Diagnosis not present

## 2019-09-16 DIAGNOSIS — K7290 Hepatic failure, unspecified without coma: Secondary | ICD-10-CM | POA: Diagnosis not present

## 2019-09-16 DIAGNOSIS — G4700 Insomnia, unspecified: Secondary | ICD-10-CM | POA: Diagnosis not present

## 2019-09-16 DIAGNOSIS — I311 Chronic constrictive pericarditis: Secondary | ICD-10-CM | POA: Diagnosis not present

## 2019-09-16 DIAGNOSIS — I482 Chronic atrial fibrillation, unspecified: Secondary | ICD-10-CM | POA: Diagnosis not present

## 2019-09-16 DIAGNOSIS — I110 Hypertensive heart disease with heart failure: Secondary | ICD-10-CM | POA: Diagnosis not present

## 2019-09-16 DIAGNOSIS — I5032 Chronic diastolic (congestive) heart failure: Secondary | ICD-10-CM | POA: Diagnosis not present

## 2019-09-16 DIAGNOSIS — K7031 Alcoholic cirrhosis of liver with ascites: Secondary | ICD-10-CM | POA: Diagnosis not present

## 2019-09-19 DIAGNOSIS — K219 Gastro-esophageal reflux disease without esophagitis: Secondary | ICD-10-CM | POA: Diagnosis not present

## 2019-09-19 DIAGNOSIS — G4700 Insomnia, unspecified: Secondary | ICD-10-CM | POA: Diagnosis not present

## 2019-09-19 DIAGNOSIS — I5032 Chronic diastolic (congestive) heart failure: Secondary | ICD-10-CM | POA: Diagnosis not present

## 2019-09-19 DIAGNOSIS — I482 Chronic atrial fibrillation, unspecified: Secondary | ICD-10-CM | POA: Diagnosis not present

## 2019-09-19 DIAGNOSIS — F411 Generalized anxiety disorder: Secondary | ICD-10-CM | POA: Diagnosis not present

## 2019-09-19 DIAGNOSIS — K7031 Alcoholic cirrhosis of liver with ascites: Secondary | ICD-10-CM | POA: Diagnosis not present

## 2019-09-19 DIAGNOSIS — K7290 Hepatic failure, unspecified without coma: Secondary | ICD-10-CM | POA: Diagnosis not present

## 2019-09-19 DIAGNOSIS — I311 Chronic constrictive pericarditis: Secondary | ICD-10-CM | POA: Diagnosis not present

## 2019-09-19 DIAGNOSIS — I110 Hypertensive heart disease with heart failure: Secondary | ICD-10-CM | POA: Diagnosis not present

## 2019-09-20 DIAGNOSIS — F411 Generalized anxiety disorder: Secondary | ICD-10-CM | POA: Diagnosis not present

## 2019-09-20 DIAGNOSIS — I311 Chronic constrictive pericarditis: Secondary | ICD-10-CM | POA: Diagnosis not present

## 2019-09-20 DIAGNOSIS — K219 Gastro-esophageal reflux disease without esophagitis: Secondary | ICD-10-CM | POA: Diagnosis not present

## 2019-09-20 DIAGNOSIS — G4700 Insomnia, unspecified: Secondary | ICD-10-CM | POA: Diagnosis not present

## 2019-09-20 DIAGNOSIS — K7290 Hepatic failure, unspecified without coma: Secondary | ICD-10-CM | POA: Diagnosis not present

## 2019-09-20 DIAGNOSIS — K7031 Alcoholic cirrhosis of liver with ascites: Secondary | ICD-10-CM | POA: Diagnosis not present

## 2019-09-20 DIAGNOSIS — I5032 Chronic diastolic (congestive) heart failure: Secondary | ICD-10-CM | POA: Diagnosis not present

## 2019-09-20 DIAGNOSIS — I110 Hypertensive heart disease with heart failure: Secondary | ICD-10-CM | POA: Diagnosis not present

## 2019-09-20 DIAGNOSIS — I482 Chronic atrial fibrillation, unspecified: Secondary | ICD-10-CM | POA: Diagnosis not present

## 2019-09-21 DIAGNOSIS — I482 Chronic atrial fibrillation, unspecified: Secondary | ICD-10-CM | POA: Diagnosis not present

## 2019-09-21 DIAGNOSIS — K7290 Hepatic failure, unspecified without coma: Secondary | ICD-10-CM | POA: Diagnosis not present

## 2019-09-21 DIAGNOSIS — F411 Generalized anxiety disorder: Secondary | ICD-10-CM | POA: Diagnosis not present

## 2019-09-21 DIAGNOSIS — I311 Chronic constrictive pericarditis: Secondary | ICD-10-CM | POA: Diagnosis not present

## 2019-09-21 DIAGNOSIS — I110 Hypertensive heart disease with heart failure: Secondary | ICD-10-CM | POA: Diagnosis not present

## 2019-09-21 DIAGNOSIS — I5032 Chronic diastolic (congestive) heart failure: Secondary | ICD-10-CM | POA: Diagnosis not present

## 2019-09-21 DIAGNOSIS — K219 Gastro-esophageal reflux disease without esophagitis: Secondary | ICD-10-CM | POA: Diagnosis not present

## 2019-09-21 DIAGNOSIS — G4700 Insomnia, unspecified: Secondary | ICD-10-CM | POA: Diagnosis not present

## 2019-09-21 DIAGNOSIS — K7031 Alcoholic cirrhosis of liver with ascites: Secondary | ICD-10-CM | POA: Diagnosis not present

## 2019-09-23 DIAGNOSIS — G4700 Insomnia, unspecified: Secondary | ICD-10-CM | POA: Diagnosis not present

## 2019-09-23 DIAGNOSIS — K219 Gastro-esophageal reflux disease without esophagitis: Secondary | ICD-10-CM | POA: Diagnosis not present

## 2019-09-23 DIAGNOSIS — I110 Hypertensive heart disease with heart failure: Secondary | ICD-10-CM | POA: Diagnosis not present

## 2019-09-23 DIAGNOSIS — K7031 Alcoholic cirrhosis of liver with ascites: Secondary | ICD-10-CM | POA: Diagnosis not present

## 2019-09-23 DIAGNOSIS — K7290 Hepatic failure, unspecified without coma: Secondary | ICD-10-CM | POA: Diagnosis not present

## 2019-09-23 DIAGNOSIS — I5032 Chronic diastolic (congestive) heart failure: Secondary | ICD-10-CM | POA: Diagnosis not present

## 2019-09-23 DIAGNOSIS — I311 Chronic constrictive pericarditis: Secondary | ICD-10-CM | POA: Diagnosis not present

## 2019-09-23 DIAGNOSIS — I482 Chronic atrial fibrillation, unspecified: Secondary | ICD-10-CM | POA: Diagnosis not present

## 2019-09-23 DIAGNOSIS — F411 Generalized anxiety disorder: Secondary | ICD-10-CM | POA: Diagnosis not present

## 2019-09-26 DIAGNOSIS — K7031 Alcoholic cirrhosis of liver with ascites: Secondary | ICD-10-CM | POA: Diagnosis not present

## 2019-09-26 DIAGNOSIS — I110 Hypertensive heart disease with heart failure: Secondary | ICD-10-CM | POA: Diagnosis not present

## 2019-09-26 DIAGNOSIS — I311 Chronic constrictive pericarditis: Secondary | ICD-10-CM | POA: Diagnosis not present

## 2019-09-26 DIAGNOSIS — Z01812 Encounter for preprocedural laboratory examination: Secondary | ICD-10-CM | POA: Diagnosis not present

## 2019-09-26 DIAGNOSIS — Z20822 Contact with and (suspected) exposure to covid-19: Secondary | ICD-10-CM | POA: Diagnosis not present

## 2019-09-26 DIAGNOSIS — I5032 Chronic diastolic (congestive) heart failure: Secondary | ICD-10-CM | POA: Diagnosis not present

## 2019-09-26 DIAGNOSIS — F411 Generalized anxiety disorder: Secondary | ICD-10-CM | POA: Diagnosis not present

## 2019-09-26 DIAGNOSIS — K219 Gastro-esophageal reflux disease without esophagitis: Secondary | ICD-10-CM | POA: Diagnosis not present

## 2019-09-26 DIAGNOSIS — I482 Chronic atrial fibrillation, unspecified: Secondary | ICD-10-CM | POA: Diagnosis not present

## 2019-09-26 DIAGNOSIS — G4700 Insomnia, unspecified: Secondary | ICD-10-CM | POA: Diagnosis not present

## 2019-09-26 DIAGNOSIS — K7290 Hepatic failure, unspecified without coma: Secondary | ICD-10-CM | POA: Diagnosis not present

## 2019-09-27 DIAGNOSIS — K7290 Hepatic failure, unspecified without coma: Secondary | ICD-10-CM | POA: Diagnosis not present

## 2019-09-27 DIAGNOSIS — K219 Gastro-esophageal reflux disease without esophagitis: Secondary | ICD-10-CM | POA: Diagnosis not present

## 2019-09-27 DIAGNOSIS — G4700 Insomnia, unspecified: Secondary | ICD-10-CM | POA: Diagnosis not present

## 2019-09-27 DIAGNOSIS — K7031 Alcoholic cirrhosis of liver with ascites: Secondary | ICD-10-CM | POA: Diagnosis not present

## 2019-09-27 DIAGNOSIS — I110 Hypertensive heart disease with heart failure: Secondary | ICD-10-CM | POA: Diagnosis not present

## 2019-09-27 DIAGNOSIS — F411 Generalized anxiety disorder: Secondary | ICD-10-CM | POA: Diagnosis not present

## 2019-09-27 DIAGNOSIS — I311 Chronic constrictive pericarditis: Secondary | ICD-10-CM | POA: Diagnosis not present

## 2019-09-27 DIAGNOSIS — I5032 Chronic diastolic (congestive) heart failure: Secondary | ICD-10-CM | POA: Diagnosis not present

## 2019-09-27 DIAGNOSIS — I482 Chronic atrial fibrillation, unspecified: Secondary | ICD-10-CM | POA: Diagnosis not present

## 2019-09-28 DIAGNOSIS — I5032 Chronic diastolic (congestive) heart failure: Secondary | ICD-10-CM | POA: Diagnosis not present

## 2019-09-28 DIAGNOSIS — G4700 Insomnia, unspecified: Secondary | ICD-10-CM | POA: Diagnosis not present

## 2019-09-28 DIAGNOSIS — I482 Chronic atrial fibrillation, unspecified: Secondary | ICD-10-CM | POA: Diagnosis not present

## 2019-09-28 DIAGNOSIS — K219 Gastro-esophageal reflux disease without esophagitis: Secondary | ICD-10-CM | POA: Diagnosis not present

## 2019-09-28 DIAGNOSIS — K7031 Alcoholic cirrhosis of liver with ascites: Secondary | ICD-10-CM | POA: Diagnosis not present

## 2019-09-28 DIAGNOSIS — I110 Hypertensive heart disease with heart failure: Secondary | ICD-10-CM | POA: Diagnosis not present

## 2019-09-28 DIAGNOSIS — K7290 Hepatic failure, unspecified without coma: Secondary | ICD-10-CM | POA: Diagnosis not present

## 2019-09-28 DIAGNOSIS — I311 Chronic constrictive pericarditis: Secondary | ICD-10-CM | POA: Diagnosis not present

## 2019-09-28 DIAGNOSIS — F411 Generalized anxiety disorder: Secondary | ICD-10-CM | POA: Diagnosis not present

## 2019-09-30 DIAGNOSIS — I5032 Chronic diastolic (congestive) heart failure: Secondary | ICD-10-CM | POA: Diagnosis not present

## 2019-09-30 DIAGNOSIS — F411 Generalized anxiety disorder: Secondary | ICD-10-CM | POA: Diagnosis not present

## 2019-09-30 DIAGNOSIS — I311 Chronic constrictive pericarditis: Secondary | ICD-10-CM | POA: Diagnosis not present

## 2019-09-30 DIAGNOSIS — I482 Chronic atrial fibrillation, unspecified: Secondary | ICD-10-CM | POA: Diagnosis not present

## 2019-09-30 DIAGNOSIS — G4700 Insomnia, unspecified: Secondary | ICD-10-CM | POA: Diagnosis not present

## 2019-09-30 DIAGNOSIS — I110 Hypertensive heart disease with heart failure: Secondary | ICD-10-CM | POA: Diagnosis not present

## 2019-09-30 DIAGNOSIS — K7290 Hepatic failure, unspecified without coma: Secondary | ICD-10-CM | POA: Diagnosis not present

## 2019-09-30 DIAGNOSIS — K7031 Alcoholic cirrhosis of liver with ascites: Secondary | ICD-10-CM | POA: Diagnosis not present

## 2019-09-30 DIAGNOSIS — K219 Gastro-esophageal reflux disease without esophagitis: Secondary | ICD-10-CM | POA: Diagnosis not present

## 2019-10-03 DIAGNOSIS — I110 Hypertensive heart disease with heart failure: Secondary | ICD-10-CM | POA: Diagnosis not present

## 2019-10-03 DIAGNOSIS — I5032 Chronic diastolic (congestive) heart failure: Secondary | ICD-10-CM | POA: Diagnosis not present

## 2019-10-03 DIAGNOSIS — F411 Generalized anxiety disorder: Secondary | ICD-10-CM | POA: Diagnosis not present

## 2019-10-03 DIAGNOSIS — G4700 Insomnia, unspecified: Secondary | ICD-10-CM | POA: Diagnosis not present

## 2019-10-03 DIAGNOSIS — I311 Chronic constrictive pericarditis: Secondary | ICD-10-CM | POA: Diagnosis not present

## 2019-10-03 DIAGNOSIS — I482 Chronic atrial fibrillation, unspecified: Secondary | ICD-10-CM | POA: Diagnosis not present

## 2019-10-03 DIAGNOSIS — K219 Gastro-esophageal reflux disease without esophagitis: Secondary | ICD-10-CM | POA: Diagnosis not present

## 2019-10-03 DIAGNOSIS — K7290 Hepatic failure, unspecified without coma: Secondary | ICD-10-CM | POA: Diagnosis not present

## 2019-10-03 DIAGNOSIS — K7031 Alcoholic cirrhosis of liver with ascites: Secondary | ICD-10-CM | POA: Diagnosis not present

## 2019-10-04 DIAGNOSIS — I311 Chronic constrictive pericarditis: Secondary | ICD-10-CM | POA: Diagnosis not present

## 2019-10-04 DIAGNOSIS — I5032 Chronic diastolic (congestive) heart failure: Secondary | ICD-10-CM | POA: Diagnosis not present

## 2019-10-04 DIAGNOSIS — I110 Hypertensive heart disease with heart failure: Secondary | ICD-10-CM | POA: Diagnosis not present

## 2019-10-04 DIAGNOSIS — I482 Chronic atrial fibrillation, unspecified: Secondary | ICD-10-CM | POA: Diagnosis not present

## 2019-10-04 DIAGNOSIS — K7031 Alcoholic cirrhosis of liver with ascites: Secondary | ICD-10-CM | POA: Diagnosis not present

## 2019-10-04 DIAGNOSIS — K219 Gastro-esophageal reflux disease without esophagitis: Secondary | ICD-10-CM | POA: Diagnosis not present

## 2019-10-04 DIAGNOSIS — G4700 Insomnia, unspecified: Secondary | ICD-10-CM | POA: Diagnosis not present

## 2019-10-04 DIAGNOSIS — F411 Generalized anxiety disorder: Secondary | ICD-10-CM | POA: Diagnosis not present

## 2019-10-04 DIAGNOSIS — K7290 Hepatic failure, unspecified without coma: Secondary | ICD-10-CM | POA: Diagnosis not present

## 2019-10-07 DIAGNOSIS — K219 Gastro-esophageal reflux disease without esophagitis: Secondary | ICD-10-CM | POA: Diagnosis not present

## 2019-10-07 DIAGNOSIS — I5032 Chronic diastolic (congestive) heart failure: Secondary | ICD-10-CM | POA: Diagnosis not present

## 2019-10-07 DIAGNOSIS — F411 Generalized anxiety disorder: Secondary | ICD-10-CM | POA: Diagnosis not present

## 2019-10-07 DIAGNOSIS — I482 Chronic atrial fibrillation, unspecified: Secondary | ICD-10-CM | POA: Diagnosis not present

## 2019-10-07 DIAGNOSIS — K7031 Alcoholic cirrhosis of liver with ascites: Secondary | ICD-10-CM | POA: Diagnosis not present

## 2019-10-07 DIAGNOSIS — G4700 Insomnia, unspecified: Secondary | ICD-10-CM | POA: Diagnosis not present

## 2019-10-07 DIAGNOSIS — I311 Chronic constrictive pericarditis: Secondary | ICD-10-CM | POA: Diagnosis not present

## 2019-10-07 DIAGNOSIS — K7290 Hepatic failure, unspecified without coma: Secondary | ICD-10-CM | POA: Diagnosis not present

## 2019-10-07 DIAGNOSIS — I110 Hypertensive heart disease with heart failure: Secondary | ICD-10-CM | POA: Diagnosis not present

## 2019-10-10 DIAGNOSIS — K7031 Alcoholic cirrhosis of liver with ascites: Secondary | ICD-10-CM | POA: Diagnosis not present

## 2019-10-10 DIAGNOSIS — I5032 Chronic diastolic (congestive) heart failure: Secondary | ICD-10-CM | POA: Diagnosis not present

## 2019-10-10 DIAGNOSIS — G4700 Insomnia, unspecified: Secondary | ICD-10-CM | POA: Diagnosis not present

## 2019-10-10 DIAGNOSIS — I311 Chronic constrictive pericarditis: Secondary | ICD-10-CM | POA: Diagnosis not present

## 2019-10-10 DIAGNOSIS — K219 Gastro-esophageal reflux disease without esophagitis: Secondary | ICD-10-CM | POA: Diagnosis not present

## 2019-10-10 DIAGNOSIS — I110 Hypertensive heart disease with heart failure: Secondary | ICD-10-CM | POA: Diagnosis not present

## 2019-10-10 DIAGNOSIS — K7290 Hepatic failure, unspecified without coma: Secondary | ICD-10-CM | POA: Diagnosis not present

## 2019-10-10 DIAGNOSIS — F411 Generalized anxiety disorder: Secondary | ICD-10-CM | POA: Diagnosis not present

## 2019-10-10 DIAGNOSIS — I482 Chronic atrial fibrillation, unspecified: Secondary | ICD-10-CM | POA: Diagnosis not present

## 2019-10-11 DIAGNOSIS — K219 Gastro-esophageal reflux disease without esophagitis: Secondary | ICD-10-CM | POA: Diagnosis not present

## 2019-10-11 DIAGNOSIS — I5032 Chronic diastolic (congestive) heart failure: Secondary | ICD-10-CM | POA: Diagnosis not present

## 2019-10-11 DIAGNOSIS — K7031 Alcoholic cirrhosis of liver with ascites: Secondary | ICD-10-CM | POA: Diagnosis not present

## 2019-10-11 DIAGNOSIS — F411 Generalized anxiety disorder: Secondary | ICD-10-CM | POA: Diagnosis not present

## 2019-10-11 DIAGNOSIS — K7290 Hepatic failure, unspecified without coma: Secondary | ICD-10-CM | POA: Diagnosis not present

## 2019-10-11 DIAGNOSIS — I110 Hypertensive heart disease with heart failure: Secondary | ICD-10-CM | POA: Diagnosis not present

## 2019-10-11 DIAGNOSIS — I482 Chronic atrial fibrillation, unspecified: Secondary | ICD-10-CM | POA: Diagnosis not present

## 2019-10-11 DIAGNOSIS — G4700 Insomnia, unspecified: Secondary | ICD-10-CM | POA: Diagnosis not present

## 2019-10-11 DIAGNOSIS — I311 Chronic constrictive pericarditis: Secondary | ICD-10-CM | POA: Diagnosis not present

## 2019-10-13 DIAGNOSIS — I311 Chronic constrictive pericarditis: Secondary | ICD-10-CM | POA: Diagnosis not present

## 2019-10-13 DIAGNOSIS — I482 Chronic atrial fibrillation, unspecified: Secondary | ICD-10-CM | POA: Diagnosis not present

## 2019-10-13 DIAGNOSIS — K7031 Alcoholic cirrhosis of liver with ascites: Secondary | ICD-10-CM | POA: Diagnosis not present

## 2019-10-13 DIAGNOSIS — K7290 Hepatic failure, unspecified without coma: Secondary | ICD-10-CM | POA: Diagnosis not present

## 2019-10-13 DIAGNOSIS — F411 Generalized anxiety disorder: Secondary | ICD-10-CM | POA: Diagnosis not present

## 2019-10-13 DIAGNOSIS — I110 Hypertensive heart disease with heart failure: Secondary | ICD-10-CM | POA: Diagnosis not present

## 2019-10-13 DIAGNOSIS — G4700 Insomnia, unspecified: Secondary | ICD-10-CM | POA: Diagnosis not present

## 2019-10-13 DIAGNOSIS — K219 Gastro-esophageal reflux disease without esophagitis: Secondary | ICD-10-CM | POA: Diagnosis not present

## 2019-10-13 DIAGNOSIS — I5032 Chronic diastolic (congestive) heart failure: Secondary | ICD-10-CM | POA: Diagnosis not present

## 2019-10-14 DIAGNOSIS — F411 Generalized anxiety disorder: Secondary | ICD-10-CM | POA: Diagnosis not present

## 2019-10-14 DIAGNOSIS — K7031 Alcoholic cirrhosis of liver with ascites: Secondary | ICD-10-CM | POA: Diagnosis not present

## 2019-10-14 DIAGNOSIS — K7290 Hepatic failure, unspecified without coma: Secondary | ICD-10-CM | POA: Diagnosis not present

## 2019-10-14 DIAGNOSIS — I311 Chronic constrictive pericarditis: Secondary | ICD-10-CM | POA: Diagnosis not present

## 2019-10-14 DIAGNOSIS — G4700 Insomnia, unspecified: Secondary | ICD-10-CM | POA: Diagnosis not present

## 2019-10-14 DIAGNOSIS — I110 Hypertensive heart disease with heart failure: Secondary | ICD-10-CM | POA: Diagnosis not present

## 2019-10-14 DIAGNOSIS — K219 Gastro-esophageal reflux disease without esophagitis: Secondary | ICD-10-CM | POA: Diagnosis not present

## 2019-10-14 DIAGNOSIS — I482 Chronic atrial fibrillation, unspecified: Secondary | ICD-10-CM | POA: Diagnosis not present

## 2019-10-14 DIAGNOSIS — I5032 Chronic diastolic (congestive) heart failure: Secondary | ICD-10-CM | POA: Diagnosis not present

## 2019-10-18 DIAGNOSIS — I482 Chronic atrial fibrillation, unspecified: Secondary | ICD-10-CM | POA: Diagnosis not present

## 2019-10-18 DIAGNOSIS — I311 Chronic constrictive pericarditis: Secondary | ICD-10-CM | POA: Diagnosis not present

## 2019-10-18 DIAGNOSIS — I5032 Chronic diastolic (congestive) heart failure: Secondary | ICD-10-CM | POA: Diagnosis not present

## 2019-10-18 DIAGNOSIS — I110 Hypertensive heart disease with heart failure: Secondary | ICD-10-CM | POA: Diagnosis not present

## 2019-10-18 DIAGNOSIS — K7031 Alcoholic cirrhosis of liver with ascites: Secondary | ICD-10-CM | POA: Diagnosis not present

## 2019-10-18 DIAGNOSIS — G4700 Insomnia, unspecified: Secondary | ICD-10-CM | POA: Diagnosis not present

## 2019-10-18 DIAGNOSIS — F411 Generalized anxiety disorder: Secondary | ICD-10-CM | POA: Diagnosis not present

## 2019-10-18 DIAGNOSIS — K7290 Hepatic failure, unspecified without coma: Secondary | ICD-10-CM | POA: Diagnosis not present

## 2019-10-18 DIAGNOSIS — K219 Gastro-esophageal reflux disease without esophagitis: Secondary | ICD-10-CM | POA: Diagnosis not present

## 2019-10-19 DIAGNOSIS — F411 Generalized anxiety disorder: Secondary | ICD-10-CM | POA: Diagnosis not present

## 2019-10-19 DIAGNOSIS — I311 Chronic constrictive pericarditis: Secondary | ICD-10-CM | POA: Diagnosis not present

## 2019-10-19 DIAGNOSIS — K219 Gastro-esophageal reflux disease without esophagitis: Secondary | ICD-10-CM | POA: Diagnosis not present

## 2019-10-19 DIAGNOSIS — I5032 Chronic diastolic (congestive) heart failure: Secondary | ICD-10-CM | POA: Diagnosis not present

## 2019-10-19 DIAGNOSIS — I482 Chronic atrial fibrillation, unspecified: Secondary | ICD-10-CM | POA: Diagnosis not present

## 2019-10-19 DIAGNOSIS — K7031 Alcoholic cirrhosis of liver with ascites: Secondary | ICD-10-CM | POA: Diagnosis not present

## 2019-10-19 DIAGNOSIS — G4700 Insomnia, unspecified: Secondary | ICD-10-CM | POA: Diagnosis not present

## 2019-10-19 DIAGNOSIS — I110 Hypertensive heart disease with heart failure: Secondary | ICD-10-CM | POA: Diagnosis not present

## 2019-10-19 DIAGNOSIS — K7290 Hepatic failure, unspecified without coma: Secondary | ICD-10-CM | POA: Diagnosis not present

## 2019-10-20 DIAGNOSIS — I110 Hypertensive heart disease with heart failure: Secondary | ICD-10-CM | POA: Diagnosis not present

## 2019-10-20 DIAGNOSIS — K219 Gastro-esophageal reflux disease without esophagitis: Secondary | ICD-10-CM | POA: Diagnosis not present

## 2019-10-20 DIAGNOSIS — I5032 Chronic diastolic (congestive) heart failure: Secondary | ICD-10-CM | POA: Diagnosis not present

## 2019-10-20 DIAGNOSIS — I482 Chronic atrial fibrillation, unspecified: Secondary | ICD-10-CM | POA: Diagnosis not present

## 2019-10-20 DIAGNOSIS — G4700 Insomnia, unspecified: Secondary | ICD-10-CM | POA: Diagnosis not present

## 2019-10-20 DIAGNOSIS — I311 Chronic constrictive pericarditis: Secondary | ICD-10-CM | POA: Diagnosis not present

## 2019-10-20 DIAGNOSIS — K7290 Hepatic failure, unspecified without coma: Secondary | ICD-10-CM | POA: Diagnosis not present

## 2019-10-20 DIAGNOSIS — F411 Generalized anxiety disorder: Secondary | ICD-10-CM | POA: Diagnosis not present

## 2019-10-20 DIAGNOSIS — K7031 Alcoholic cirrhosis of liver with ascites: Secondary | ICD-10-CM | POA: Diagnosis not present

## 2019-10-21 DIAGNOSIS — G9349 Other encephalopathy: Secondary | ICD-10-CM | POA: Diagnosis not present

## 2019-10-21 DIAGNOSIS — K219 Gastro-esophageal reflux disease without esophagitis: Secondary | ICD-10-CM | POA: Diagnosis not present

## 2019-10-21 DIAGNOSIS — G8911 Acute pain due to trauma: Secondary | ICD-10-CM | POA: Diagnosis not present

## 2019-10-21 DIAGNOSIS — D638 Anemia in other chronic diseases classified elsewhere: Secondary | ICD-10-CM | POA: Diagnosis not present

## 2019-10-21 DIAGNOSIS — I110 Hypertensive heart disease with heart failure: Secondary | ICD-10-CM | POA: Diagnosis not present

## 2019-10-21 DIAGNOSIS — R918 Other nonspecific abnormal finding of lung field: Secondary | ICD-10-CM | POA: Diagnosis not present

## 2019-10-21 DIAGNOSIS — F1010 Alcohol abuse, uncomplicated: Secondary | ICD-10-CM | POA: Diagnosis not present

## 2019-10-21 DIAGNOSIS — M25551 Pain in right hip: Secondary | ICD-10-CM | POA: Diagnosis not present

## 2019-10-21 DIAGNOSIS — R296 Repeated falls: Secondary | ICD-10-CM | POA: Diagnosis not present

## 2019-10-21 DIAGNOSIS — R4182 Altered mental status, unspecified: Secondary | ICD-10-CM | POA: Diagnosis not present

## 2019-10-21 DIAGNOSIS — M25552 Pain in left hip: Secondary | ICD-10-CM | POA: Diagnosis not present

## 2019-10-21 DIAGNOSIS — I509 Heart failure, unspecified: Secondary | ICD-10-CM | POA: Diagnosis not present

## 2019-10-21 DIAGNOSIS — M96662 Fracture of femur following insertion of orthopedic implant, joint prosthesis, or bone plate, left leg: Secondary | ICD-10-CM | POA: Diagnosis not present

## 2019-10-21 DIAGNOSIS — K7031 Alcoholic cirrhosis of liver with ascites: Secondary | ICD-10-CM | POA: Diagnosis not present

## 2019-10-21 DIAGNOSIS — R262 Difficulty in walking, not elsewhere classified: Secondary | ICD-10-CM | POA: Diagnosis not present

## 2019-10-21 DIAGNOSIS — I482 Chronic atrial fibrillation, unspecified: Secondary | ICD-10-CM | POA: Diagnosis not present

## 2019-10-21 DIAGNOSIS — Z96642 Presence of left artificial hip joint: Secondary | ICD-10-CM | POA: Diagnosis not present

## 2019-10-21 DIAGNOSIS — E871 Hypo-osmolality and hyponatremia: Secondary | ICD-10-CM | POA: Diagnosis not present

## 2019-10-21 DIAGNOSIS — I5032 Chronic diastolic (congestive) heart failure: Secondary | ICD-10-CM | POA: Diagnosis not present

## 2019-10-21 DIAGNOSIS — S72002A Fracture of unspecified part of neck of left femur, initial encounter for closed fracture: Secondary | ICD-10-CM | POA: Diagnosis not present

## 2019-10-21 DIAGNOSIS — S72012D Unspecified intracapsular fracture of left femur, subsequent encounter for closed fracture with routine healing: Secondary | ICD-10-CM | POA: Diagnosis not present

## 2019-10-21 DIAGNOSIS — H9190 Unspecified hearing loss, unspecified ear: Secondary | ICD-10-CM | POA: Diagnosis not present

## 2019-10-21 DIAGNOSIS — J90 Pleural effusion, not elsewhere classified: Secondary | ICD-10-CM | POA: Diagnosis not present

## 2019-10-21 DIAGNOSIS — S72012A Unspecified intracapsular fracture of left femur, initial encounter for closed fracture: Secondary | ICD-10-CM | POA: Diagnosis not present

## 2019-10-21 DIAGNOSIS — F4310 Post-traumatic stress disorder, unspecified: Secondary | ICD-10-CM | POA: Diagnosis not present

## 2019-10-21 DIAGNOSIS — I311 Chronic constrictive pericarditis: Secondary | ICD-10-CM | POA: Diagnosis not present

## 2019-10-21 DIAGNOSIS — I517 Cardiomegaly: Secondary | ICD-10-CM | POA: Diagnosis not present

## 2019-10-21 DIAGNOSIS — W19XXXD Unspecified fall, subsequent encounter: Secondary | ICD-10-CM | POA: Diagnosis not present

## 2019-10-24 ENCOUNTER — Ambulatory Visit: Payer: Medicare HMO | Admitting: Radiation Oncology

## 2019-10-27 DIAGNOSIS — K7031 Alcoholic cirrhosis of liver with ascites: Secondary | ICD-10-CM | POA: Diagnosis not present

## 2019-10-27 DIAGNOSIS — D638 Anemia in other chronic diseases classified elsewhere: Secondary | ICD-10-CM | POA: Diagnosis not present

## 2019-10-27 DIAGNOSIS — G9349 Other encephalopathy: Secondary | ICD-10-CM | POA: Diagnosis not present

## 2019-10-27 DIAGNOSIS — F1020 Alcohol dependence, uncomplicated: Secondary | ICD-10-CM | POA: Diagnosis not present

## 2019-10-27 DIAGNOSIS — J948 Other specified pleural conditions: Secondary | ICD-10-CM | POA: Diagnosis not present

## 2019-10-27 DIAGNOSIS — I110 Hypertensive heart disease with heart failure: Secondary | ICD-10-CM | POA: Diagnosis not present

## 2019-10-27 DIAGNOSIS — W19XXXA Unspecified fall, initial encounter: Secondary | ICD-10-CM | POA: Diagnosis not present

## 2019-10-27 DIAGNOSIS — Z96642 Presence of left artificial hip joint: Secondary | ICD-10-CM | POA: Diagnosis not present

## 2019-10-27 DIAGNOSIS — W19XXXD Unspecified fall, subsequent encounter: Secondary | ICD-10-CM | POA: Diagnosis not present

## 2019-10-27 DIAGNOSIS — E119 Type 2 diabetes mellitus without complications: Secondary | ICD-10-CM | POA: Diagnosis not present

## 2019-10-27 DIAGNOSIS — S72002A Fracture of unspecified part of neck of left femur, initial encounter for closed fracture: Secondary | ICD-10-CM | POA: Diagnosis not present

## 2019-10-27 DIAGNOSIS — J189 Pneumonia, unspecified organism: Secondary | ICD-10-CM | POA: Diagnosis not present

## 2019-10-27 DIAGNOSIS — S72012D Unspecified intracapsular fracture of left femur, subsequent encounter for closed fracture with routine healing: Secondary | ICD-10-CM | POA: Diagnosis not present

## 2019-10-27 DIAGNOSIS — M542 Cervicalgia: Secondary | ICD-10-CM | POA: Diagnosis not present

## 2019-10-27 DIAGNOSIS — Z4789 Encounter for other orthopedic aftercare: Secondary | ICD-10-CM | POA: Diagnosis not present

## 2019-10-27 DIAGNOSIS — M79603 Pain in arm, unspecified: Secondary | ICD-10-CM | POA: Diagnosis not present

## 2019-10-27 DIAGNOSIS — R918 Other nonspecific abnormal finding of lung field: Secondary | ICD-10-CM | POA: Diagnosis not present

## 2019-10-27 DIAGNOSIS — M546 Pain in thoracic spine: Secondary | ICD-10-CM | POA: Diagnosis not present

## 2019-10-27 DIAGNOSIS — R05 Cough: Secondary | ICD-10-CM | POA: Diagnosis not present

## 2019-10-27 DIAGNOSIS — S72002D Fracture of unspecified part of neck of left femur, subsequent encounter for closed fracture with routine healing: Secondary | ICD-10-CM | POA: Diagnosis not present

## 2019-10-27 DIAGNOSIS — M545 Low back pain: Secondary | ICD-10-CM | POA: Diagnosis not present

## 2019-10-27 DIAGNOSIS — R410 Disorientation, unspecified: Secondary | ICD-10-CM | POA: Diagnosis not present

## 2019-10-27 DIAGNOSIS — J90 Pleural effusion, not elsewhere classified: Secondary | ICD-10-CM | POA: Diagnosis not present

## 2019-10-27 DIAGNOSIS — I6523 Occlusion and stenosis of bilateral carotid arteries: Secondary | ICD-10-CM | POA: Diagnosis not present

## 2019-10-27 DIAGNOSIS — R7989 Other specified abnormal findings of blood chemistry: Secondary | ICD-10-CM | POA: Diagnosis not present

## 2019-10-27 DIAGNOSIS — R4182 Altered mental status, unspecified: Secondary | ICD-10-CM | POA: Diagnosis not present

## 2019-10-27 DIAGNOSIS — I2510 Atherosclerotic heart disease of native coronary artery without angina pectoris: Secondary | ICD-10-CM | POA: Diagnosis not present

## 2019-10-27 DIAGNOSIS — R296 Repeated falls: Secondary | ICD-10-CM | POA: Diagnosis not present

## 2019-10-27 DIAGNOSIS — I311 Chronic constrictive pericarditis: Secondary | ICD-10-CM | POA: Diagnosis not present

## 2019-10-27 DIAGNOSIS — I5032 Chronic diastolic (congestive) heart failure: Secondary | ICD-10-CM | POA: Diagnosis not present

## 2019-10-27 DIAGNOSIS — I509 Heart failure, unspecified: Secondary | ICD-10-CM | POA: Diagnosis not present

## 2019-10-27 DIAGNOSIS — F1010 Alcohol abuse, uncomplicated: Secondary | ICD-10-CM | POA: Diagnosis not present

## 2019-10-27 DIAGNOSIS — M25521 Pain in right elbow: Secondary | ICD-10-CM | POA: Diagnosis not present

## 2019-10-27 DIAGNOSIS — I4891 Unspecified atrial fibrillation: Secondary | ICD-10-CM | POA: Diagnosis not present

## 2019-10-27 DIAGNOSIS — I482 Chronic atrial fibrillation, unspecified: Secondary | ICD-10-CM | POA: Diagnosis not present

## 2019-10-27 DIAGNOSIS — R0602 Shortness of breath: Secondary | ICD-10-CM | POA: Diagnosis not present

## 2019-10-27 DIAGNOSIS — E871 Hypo-osmolality and hyponatremia: Secondary | ICD-10-CM | POA: Diagnosis not present

## 2019-10-27 DIAGNOSIS — I10 Essential (primary) hypertension: Secondary | ICD-10-CM | POA: Diagnosis not present

## 2019-10-27 DIAGNOSIS — M25552 Pain in left hip: Secondary | ICD-10-CM | POA: Diagnosis not present

## 2019-10-27 DIAGNOSIS — I319 Disease of pericardium, unspecified: Secondary | ICD-10-CM | POA: Diagnosis not present

## 2019-10-27 DIAGNOSIS — D649 Anemia, unspecified: Secondary | ICD-10-CM | POA: Diagnosis not present

## 2019-10-27 DIAGNOSIS — K828 Other specified diseases of gallbladder: Secondary | ICD-10-CM | POA: Diagnosis not present

## 2019-10-27 DIAGNOSIS — I6502 Occlusion and stenosis of left vertebral artery: Secondary | ICD-10-CM | POA: Diagnosis not present

## 2019-10-27 DIAGNOSIS — J939 Pneumothorax, unspecified: Secondary | ICD-10-CM | POA: Diagnosis not present

## 2019-10-27 DIAGNOSIS — R262 Difficulty in walking, not elsewhere classified: Secondary | ICD-10-CM | POA: Diagnosis not present

## 2019-11-03 DIAGNOSIS — D649 Anemia, unspecified: Secondary | ICD-10-CM | POA: Diagnosis not present

## 2019-11-03 DIAGNOSIS — S72002D Fracture of unspecified part of neck of left femur, subsequent encounter for closed fracture with routine healing: Secondary | ICD-10-CM | POA: Diagnosis not present

## 2019-11-03 DIAGNOSIS — K7031 Alcoholic cirrhosis of liver with ascites: Secondary | ICD-10-CM | POA: Diagnosis not present

## 2019-11-03 DIAGNOSIS — E871 Hypo-osmolality and hyponatremia: Secondary | ICD-10-CM | POA: Diagnosis not present

## 2019-11-08 DIAGNOSIS — Z4789 Encounter for other orthopedic aftercare: Secondary | ICD-10-CM | POA: Diagnosis not present

## 2019-11-14 ENCOUNTER — Telehealth: Payer: Self-pay | Admitting: *Deleted

## 2019-11-14 NOTE — Telephone Encounter (Signed)
Called patient to ask whether he would be willing to reschedule scan, patient asked that I phone his daughter,I called [name] and she stated that she would call her dad and that they would put their heads together and that she will call me back with a decsion

## 2019-11-15 DIAGNOSIS — M79603 Pain in arm, unspecified: Secondary | ICD-10-CM | POA: Diagnosis not present

## 2019-11-15 DIAGNOSIS — I5032 Chronic diastolic (congestive) heart failure: Secondary | ICD-10-CM | POA: Diagnosis not present

## 2019-11-15 DIAGNOSIS — R0602 Shortness of breath: Secondary | ICD-10-CM | POA: Diagnosis not present

## 2019-11-16 DIAGNOSIS — R05 Cough: Secondary | ICD-10-CM | POA: Diagnosis not present

## 2019-11-16 DIAGNOSIS — R0602 Shortness of breath: Secondary | ICD-10-CM | POA: Diagnosis not present

## 2019-11-16 DIAGNOSIS — J189 Pneumonia, unspecified organism: Secondary | ICD-10-CM | POA: Diagnosis not present

## 2019-11-16 DIAGNOSIS — I5032 Chronic diastolic (congestive) heart failure: Secondary | ICD-10-CM | POA: Diagnosis not present

## 2019-11-21 DIAGNOSIS — R4182 Altered mental status, unspecified: Secondary | ICD-10-CM | POA: Diagnosis not present

## 2019-11-21 DIAGNOSIS — E871 Hypo-osmolality and hyponatremia: Secondary | ICD-10-CM | POA: Diagnosis not present

## 2019-11-21 DIAGNOSIS — J90 Pleural effusion, not elsewhere classified: Secondary | ICD-10-CM | POA: Diagnosis not present

## 2019-11-21 DIAGNOSIS — W19XXXA Unspecified fall, initial encounter: Secondary | ICD-10-CM | POA: Diagnosis not present

## 2019-11-21 DIAGNOSIS — J189 Pneumonia, unspecified organism: Secondary | ICD-10-CM | POA: Diagnosis not present

## 2019-11-24 DIAGNOSIS — R4182 Altered mental status, unspecified: Secondary | ICD-10-CM | POA: Diagnosis not present

## 2019-11-24 DIAGNOSIS — I6523 Occlusion and stenosis of bilateral carotid arteries: Secondary | ICD-10-CM | POA: Diagnosis not present

## 2019-11-24 DIAGNOSIS — J948 Other specified pleural conditions: Secondary | ICD-10-CM | POA: Diagnosis not present

## 2019-11-24 DIAGNOSIS — I509 Heart failure, unspecified: Secondary | ICD-10-CM | POA: Diagnosis not present

## 2019-11-24 DIAGNOSIS — I10 Essential (primary) hypertension: Secondary | ICD-10-CM | POA: Diagnosis not present

## 2019-11-24 DIAGNOSIS — R918 Other nonspecific abnormal finding of lung field: Secondary | ICD-10-CM | POA: Diagnosis not present

## 2019-11-24 DIAGNOSIS — I110 Hypertensive heart disease with heart failure: Secondary | ICD-10-CM | POA: Diagnosis not present

## 2019-11-24 DIAGNOSIS — I319 Disease of pericardium, unspecified: Secondary | ICD-10-CM | POA: Diagnosis not present

## 2019-11-24 DIAGNOSIS — I6502 Occlusion and stenosis of left vertebral artery: Secondary | ICD-10-CM | POA: Diagnosis not present

## 2019-11-24 DIAGNOSIS — F1020 Alcohol dependence, uncomplicated: Secondary | ICD-10-CM | POA: Diagnosis not present

## 2019-11-24 DIAGNOSIS — J90 Pleural effusion, not elsewhere classified: Secondary | ICD-10-CM | POA: Diagnosis not present

## 2019-11-24 DIAGNOSIS — J939 Pneumothorax, unspecified: Secondary | ICD-10-CM | POA: Diagnosis not present

## 2019-11-24 DIAGNOSIS — K828 Other specified diseases of gallbladder: Secondary | ICD-10-CM | POA: Diagnosis not present

## 2019-11-24 DIAGNOSIS — E119 Type 2 diabetes mellitus without complications: Secondary | ICD-10-CM | POA: Diagnosis not present

## 2019-11-24 DIAGNOSIS — R7989 Other specified abnormal findings of blood chemistry: Secondary | ICD-10-CM | POA: Diagnosis not present

## 2019-11-24 DIAGNOSIS — I482 Chronic atrial fibrillation, unspecified: Secondary | ICD-10-CM | POA: Diagnosis not present

## 2019-11-24 DIAGNOSIS — I311 Chronic constrictive pericarditis: Secondary | ICD-10-CM | POA: Diagnosis not present

## 2019-11-24 DIAGNOSIS — I2510 Atherosclerotic heart disease of native coronary artery without angina pectoris: Secondary | ICD-10-CM | POA: Diagnosis not present

## 2019-11-24 DIAGNOSIS — I4891 Unspecified atrial fibrillation: Secondary | ICD-10-CM | POA: Diagnosis not present

## 2019-11-25 DIAGNOSIS — E43 Unspecified severe protein-calorie malnutrition: Secondary | ICD-10-CM | POA: Diagnosis not present

## 2019-11-25 DIAGNOSIS — R451 Restlessness and agitation: Secondary | ICD-10-CM | POA: Diagnosis not present

## 2019-11-25 DIAGNOSIS — G9340 Encephalopathy, unspecified: Secondary | ICD-10-CM | POA: Diagnosis not present

## 2019-11-25 DIAGNOSIS — G9349 Other encephalopathy: Secondary | ICD-10-CM | POA: Diagnosis not present

## 2019-11-25 DIAGNOSIS — I5032 Chronic diastolic (congestive) heart failure: Secondary | ICD-10-CM | POA: Diagnosis not present

## 2019-11-25 DIAGNOSIS — K828 Other specified diseases of gallbladder: Secondary | ICD-10-CM | POA: Diagnosis not present

## 2019-11-25 DIAGNOSIS — Z7901 Long term (current) use of anticoagulants: Secondary | ICD-10-CM | POA: Diagnosis not present

## 2019-11-25 DIAGNOSIS — I482 Chronic atrial fibrillation, unspecified: Secondary | ICD-10-CM | POA: Diagnosis not present

## 2019-11-25 DIAGNOSIS — R410 Disorientation, unspecified: Secondary | ICD-10-CM | POA: Diagnosis not present

## 2019-11-25 DIAGNOSIS — I311 Chronic constrictive pericarditis: Secondary | ICD-10-CM | POA: Diagnosis not present

## 2019-11-25 DIAGNOSIS — J939 Pneumothorax, unspecified: Secondary | ICD-10-CM | POA: Diagnosis not present

## 2019-11-25 DIAGNOSIS — R4182 Altered mental status, unspecified: Secondary | ICD-10-CM | POA: Diagnosis not present

## 2019-11-25 DIAGNOSIS — I87322 Chronic venous hypertension (idiopathic) with inflammation of left lower extremity: Secondary | ICD-10-CM | POA: Diagnosis not present

## 2019-11-25 DIAGNOSIS — I110 Hypertensive heart disease with heart failure: Secondary | ICD-10-CM | POA: Diagnosis not present

## 2019-11-25 DIAGNOSIS — I4891 Unspecified atrial fibrillation: Secondary | ICD-10-CM | POA: Diagnosis not present

## 2019-11-25 DIAGNOSIS — Z515 Encounter for palliative care: Secondary | ICD-10-CM | POA: Diagnosis not present

## 2019-11-25 DIAGNOSIS — E871 Hypo-osmolality and hyponatremia: Secondary | ICD-10-CM | POA: Diagnosis not present

## 2019-11-25 DIAGNOSIS — I2510 Atherosclerotic heart disease of native coronary artery without angina pectoris: Secondary | ICD-10-CM | POA: Diagnosis not present

## 2019-11-25 DIAGNOSIS — I509 Heart failure, unspecified: Secondary | ICD-10-CM | POA: Diagnosis not present

## 2019-11-25 DIAGNOSIS — F1020 Alcohol dependence, uncomplicated: Secondary | ICD-10-CM | POA: Diagnosis not present

## 2019-11-25 DIAGNOSIS — E119 Type 2 diabetes mellitus without complications: Secondary | ICD-10-CM | POA: Diagnosis not present

## 2019-11-25 DIAGNOSIS — T189XXA Foreign body of alimentary tract, part unspecified, initial encounter: Secondary | ICD-10-CM | POA: Diagnosis not present

## 2019-11-25 DIAGNOSIS — J9383 Other pneumothorax: Secondary | ICD-10-CM | POA: Diagnosis not present

## 2019-11-25 DIAGNOSIS — K7031 Alcoholic cirrhosis of liver with ascites: Secondary | ICD-10-CM | POA: Diagnosis not present

## 2019-11-25 DIAGNOSIS — J948 Other specified pleural conditions: Secondary | ICD-10-CM | POA: Diagnosis not present

## 2019-12-18 DEATH — deceased

## 2020-02-01 IMAGING — CR CHEST - 2 VIEW
2 series · 2 of 2 positions shown · non-contrast
Comparison: PET 06/24/2018

CLINICAL DATA: Lung nodules pre bronchoscopy

EXAM:
CHEST - 2 VIEW

[w chest pa]
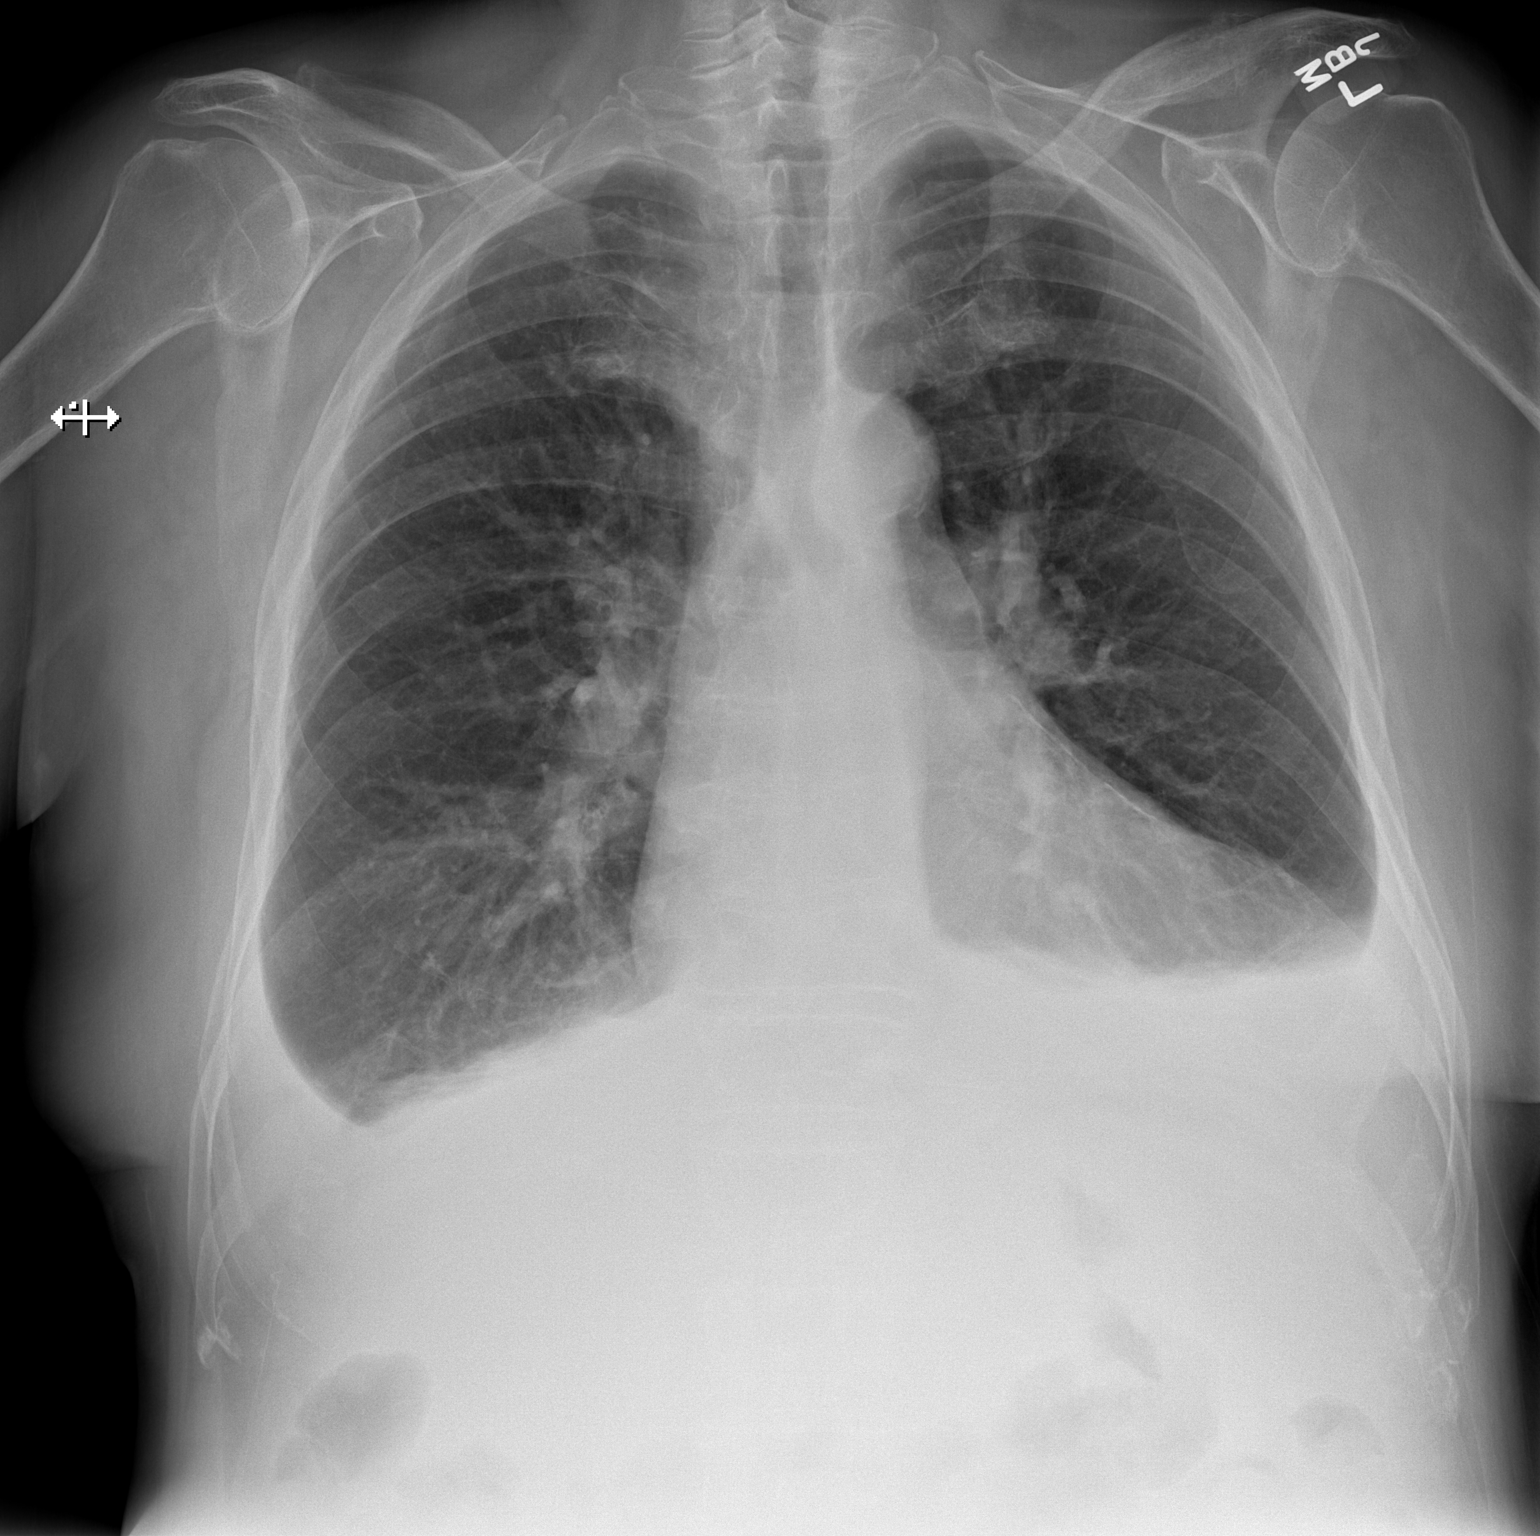

[w chest lat]
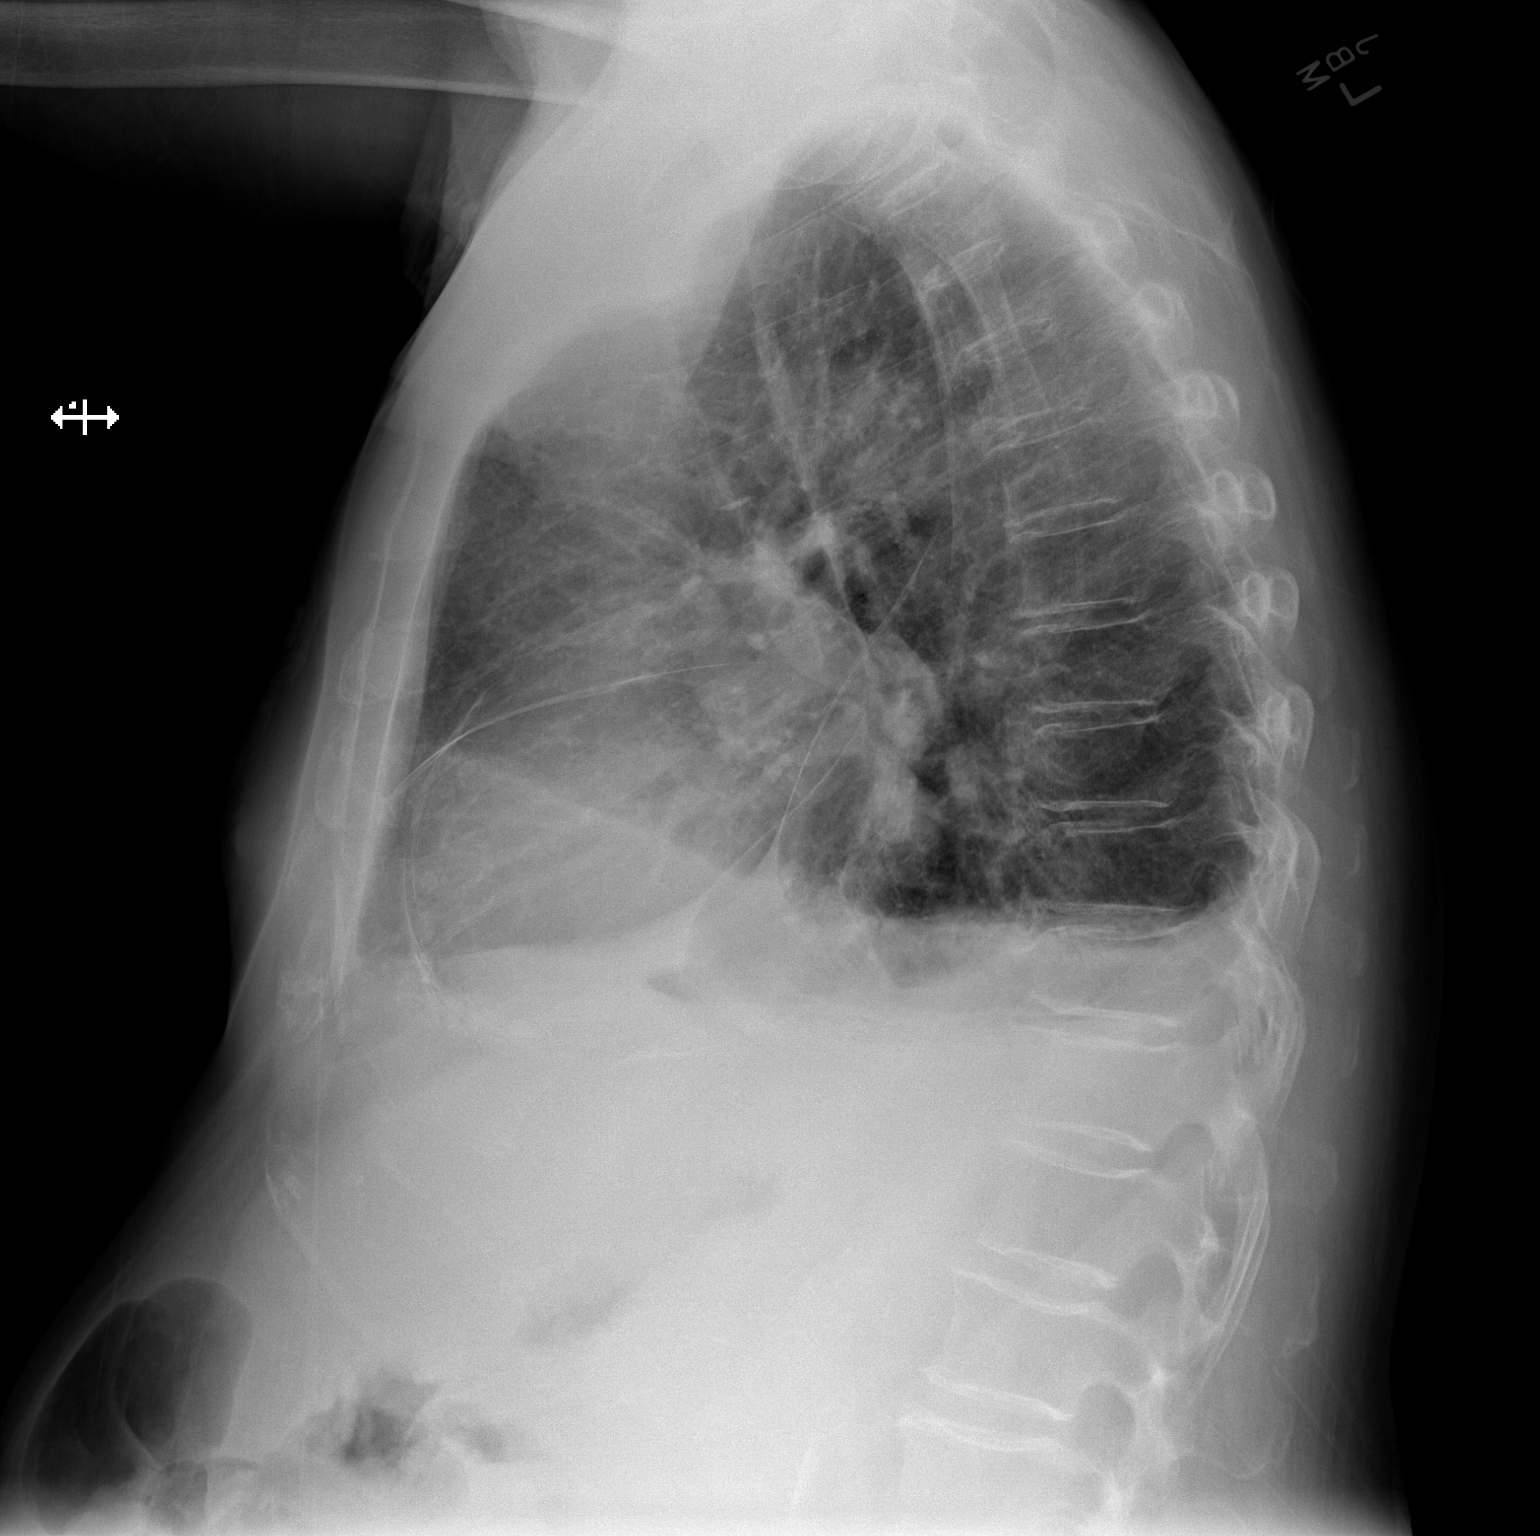

[2 of 2 positions shown; findings below may reference images not displayed]

FINDINGS: Cardiac enlargement with pericardial calcification. Negative for
edema. Bilateral pleural effusions similar to the prior study. Mild
atelectasis in the lung bases. Lingular nodule and right lower lobe
nodule best seen on prior cross-sectional imaging.
IMPRESSION: Small bilateral effusions and bibasilar atelectasis. Negative for
edema.

Bilateral lung nodules best seen on prior cross-sectional imaging.

Pericardial calcification.

## 2020-02-18 IMAGING — US IR PARACENTESIS
1 series · 2 of 2 positions shown · non-contrast
Comparison: none

INDICATION: Patient with history of ETOH cirrhosis with recurrent ascites.
Request for therapeutic paracentesis today in IR.

[Series 1: ir (id) (id)/(id)/(id) ir · 2 of 2 slices shown]
[im 1/2]
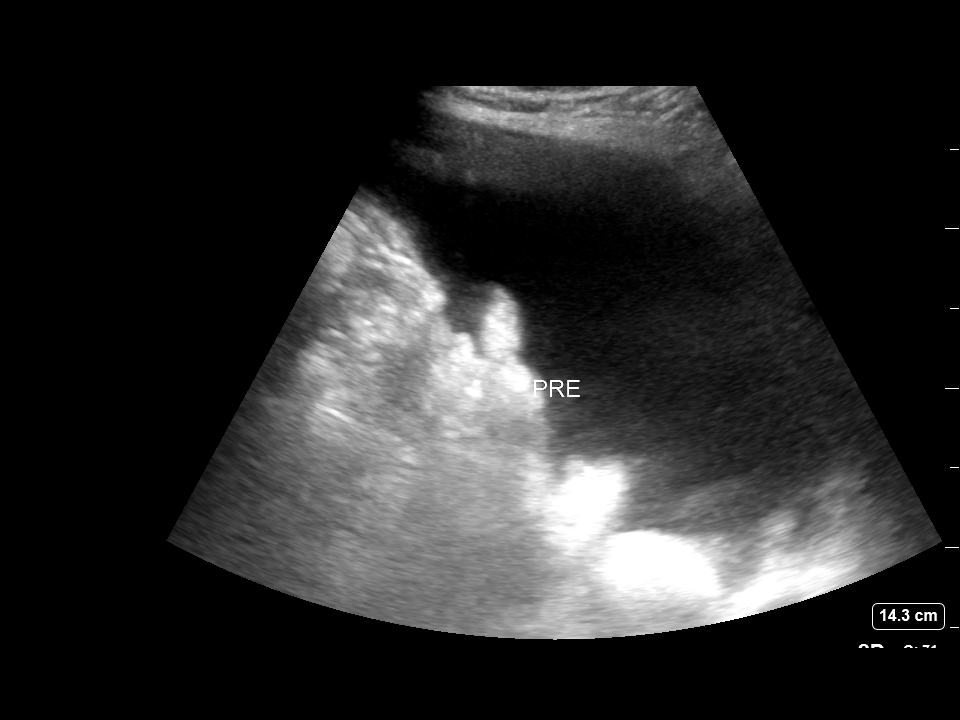
[im 2/2]
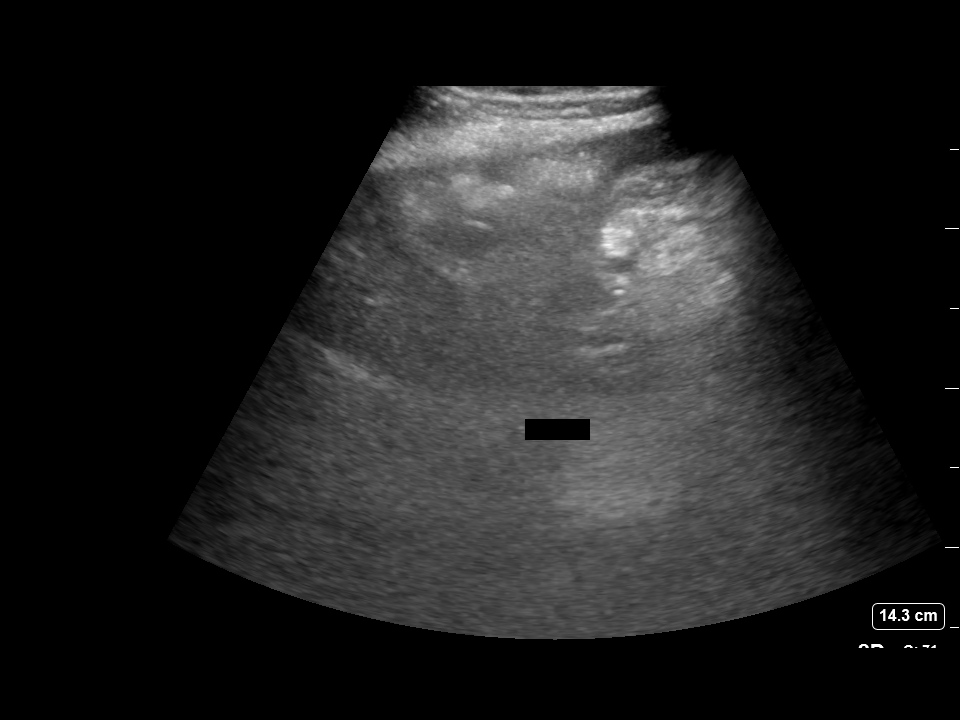

[2 of 2 positions shown; findings below may reference images not displayed]

EXAM:
ULTRASOUND GUIDED THERAPEUTIC PARACENTESIS

MEDICATIONS:
10 mL 1% lidocaine.

COMPLICATIONS:
None immediate.

PROCEDURE:
Informed written consent was obtained from the patient after a
discussion of the risks, benefits and alternatives to treatment. A
timeout was performed prior to the initiation of the procedure.

Initial ultrasound scanning demonstrates a large amount of ascites
within the right lower abdominal quadrant. The right lower abdomen
was prepped and draped in the usual sterile fashion. 1% lidocaine
was used for local anesthesia.

Following this, a 19 gauge, 7-cm, Yueh catheter was introduced. An
ultrasound image was saved for documentation purposes. The
paracentesis was performed. The catheter was removed and a dressing
was applied. The patient tolerated the procedure well without
immediate post procedural complication.
FINDINGS: A total of approximately 1.2L of hazy yellow fluid was removed.

Read by Gabella, Markeliana
IMPRESSION: Successful ultrasound-guided paracentesis yielding 1.2L liters of
peritoneal fluid.

Read by Gabella, Markeliana

## 2020-10-04 IMAGING — US IR ABDOMEN US LIMITED
1 series · 3 of 3 positions shown · non-contrast
Comparison: 02/28/2019 and previous

CLINICAL DATA: Cirrhosis, abdominal discomfort, history of
recurrent large volume abdominal ascites.

EXAM:
LIMITED ABDOMEN ULTRASOUND FOR ASCITES
TECHNIQUE: Limited ultrasound survey for ascites was performed in all four
abdominal quadrants.

[Series 1: ir (id) (id)/(id)/(id) ir · 3 of 3 slices shown]
[im 1/3]
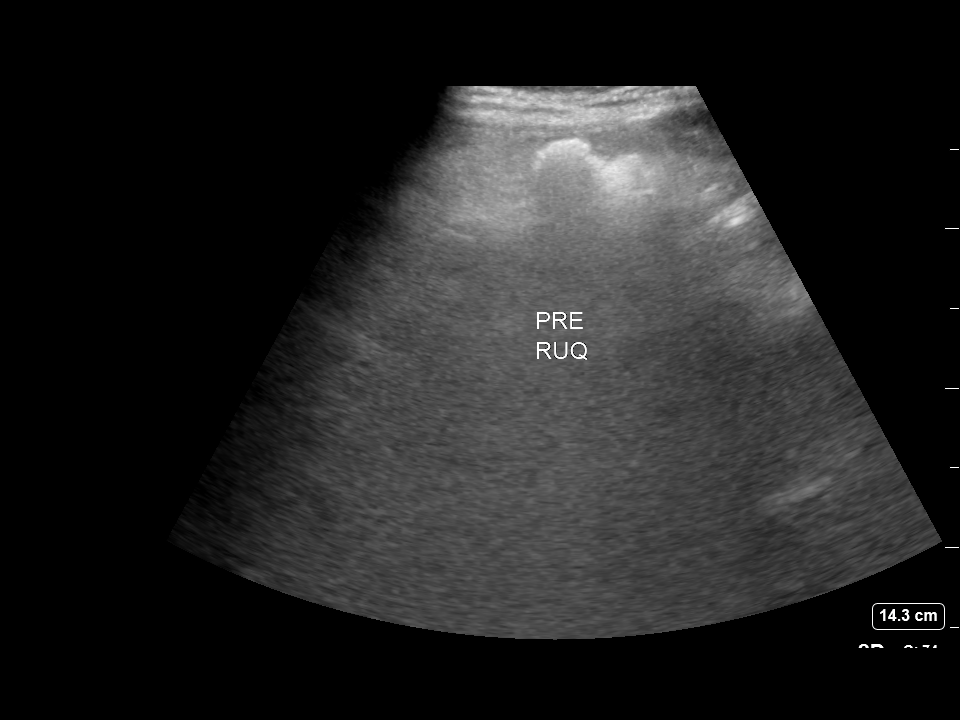
[im 2/3]
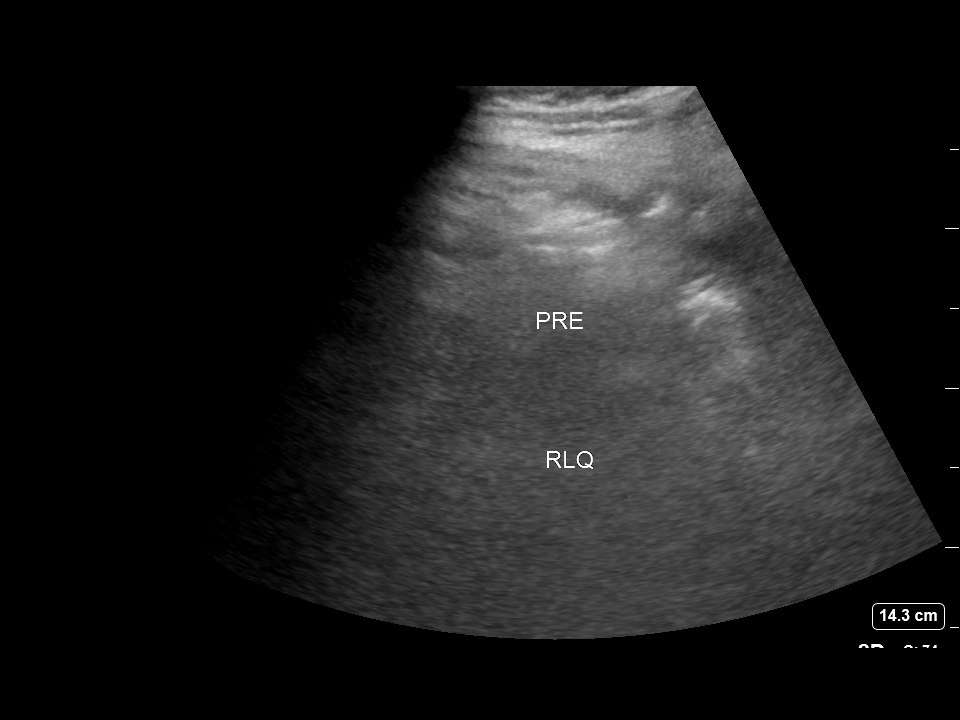
[im 3/3]
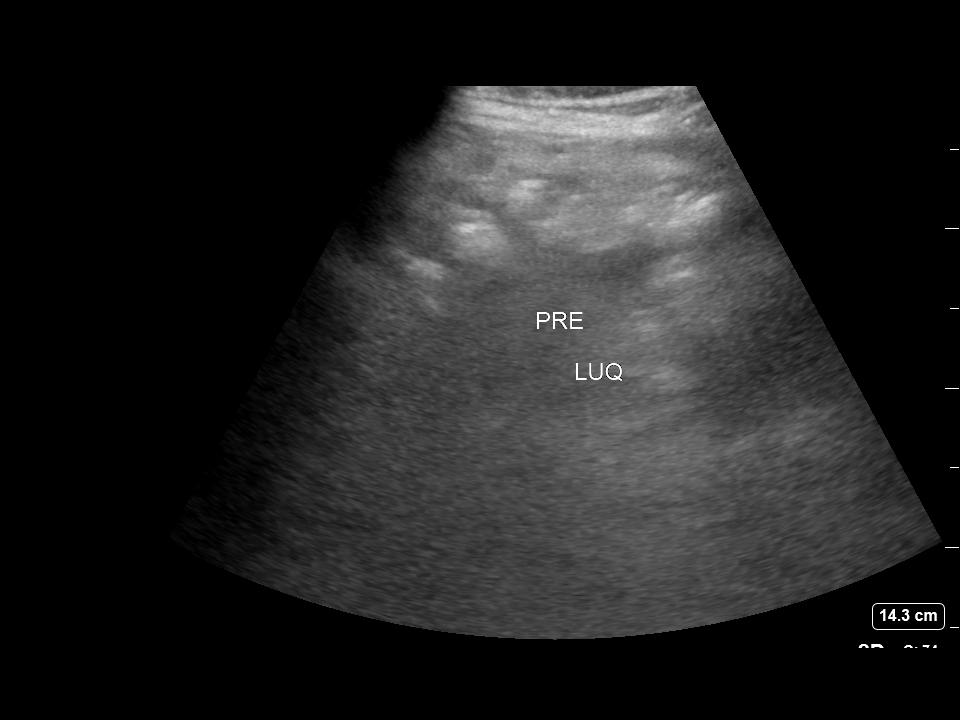

[3 of 3 positions shown; findings below may reference images not displayed]

FINDINGS: Survey ultrasound shows minimal abdominal ascites.
IMPRESSION: Minimal ascites.  Paracentesis deferred.

## 2020-10-05 IMAGING — DX DG CHEST 1V
1 series · 1 of 1 positions shown · non-contrast
Comparison: 03/09/2019

CLINICAL DATA: Status post thoracentesis.

EXAM:
CHEST  1 VIEW

[chest ap]
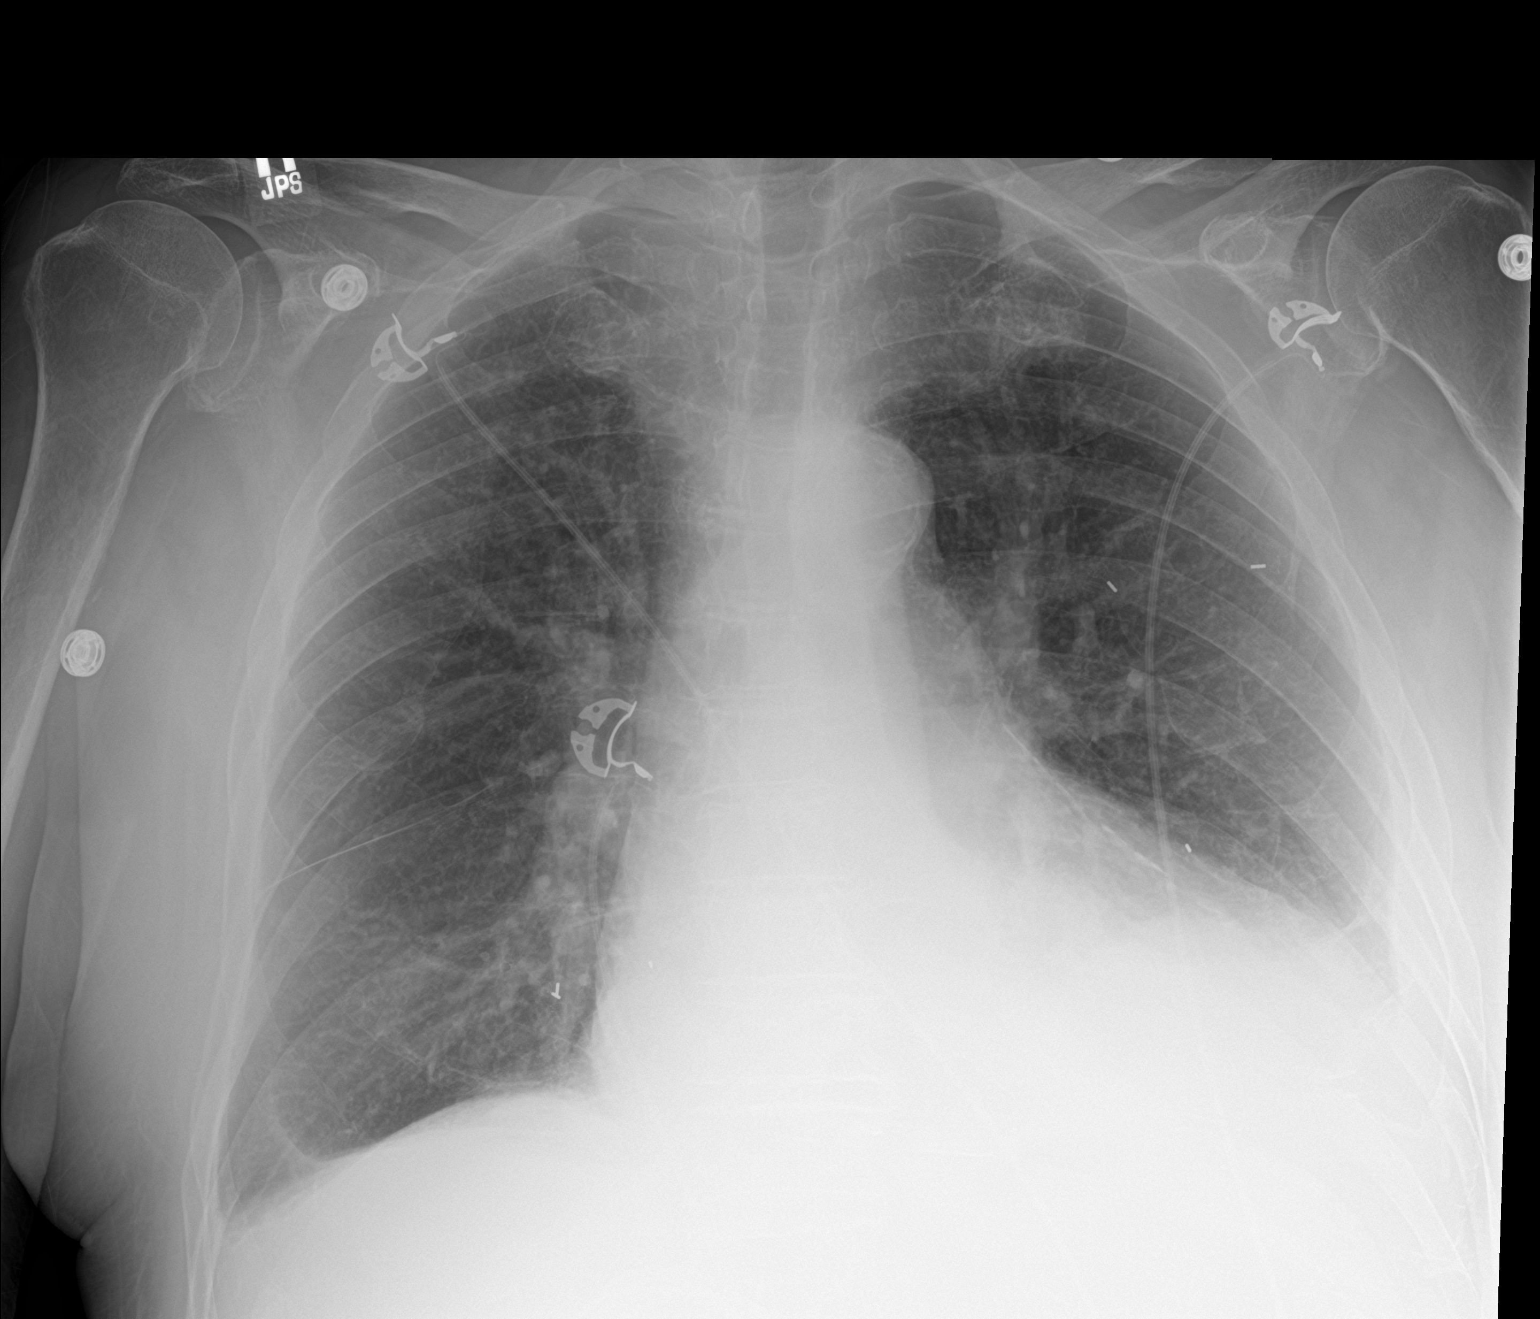

[1 of 1 positions shown; findings below may reference images not displayed]

FINDINGS: Interval evacuation of the right pleural fluid collection. No
postprocedural pneumothorax is identified. There is a persistent
small left effusion and left basilar atelectasis.
IMPRESSION: Evacuation of right pleural fluid collection without postprocedural
pneumothorax.

Persistent small left effusion and left basilar atelectasis.

## 2020-10-17 IMAGING — US IR PARACENTESIS
1 series · 4 of 4 positions shown · non-contrast
Comparison: none

INDICATION: Patient with history of alcoholic cirrhosis, recurrent ascites.
Request for therapeutic paracentesis.

[Series 1: ir paracentesis · 4 of 4 slices shown]
[im 1/4]
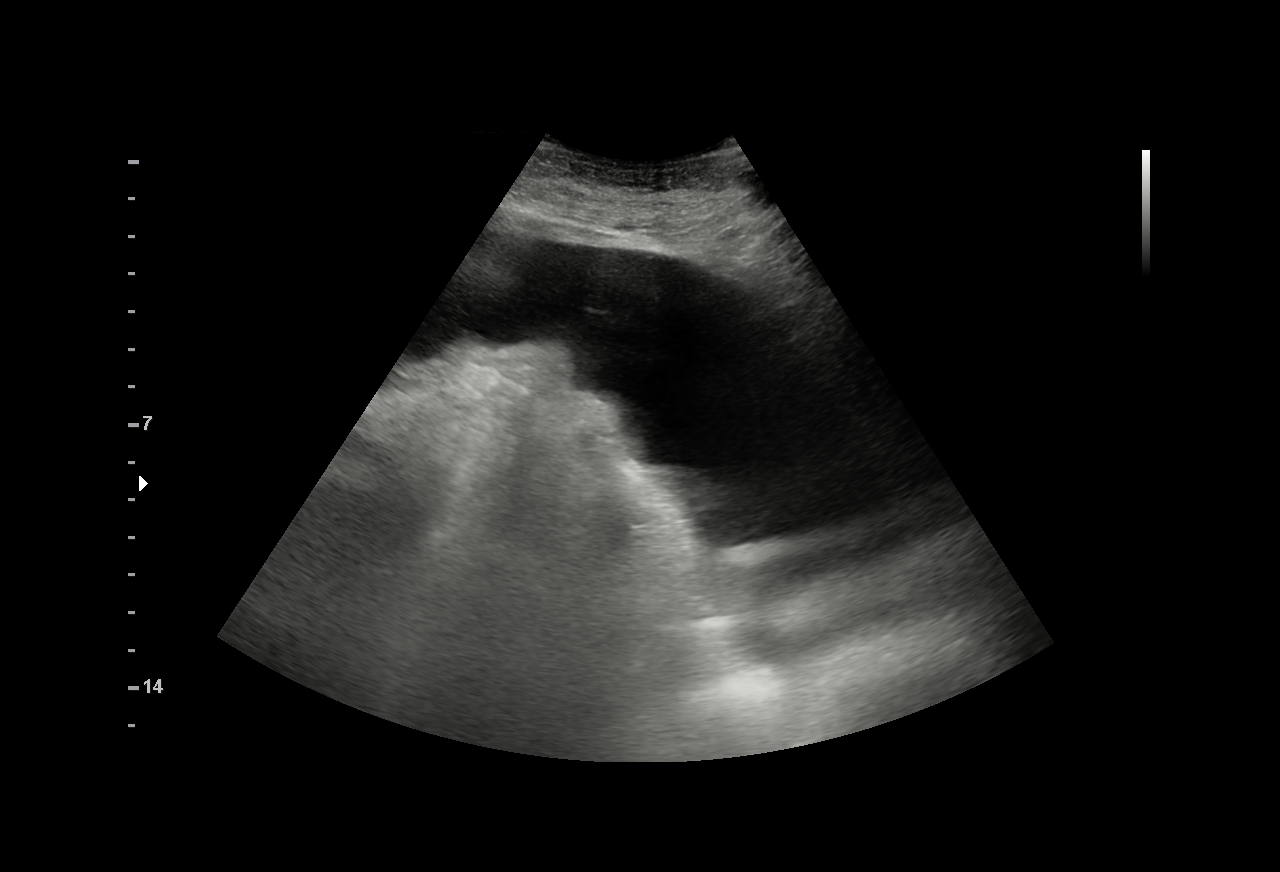
[im 2/4]
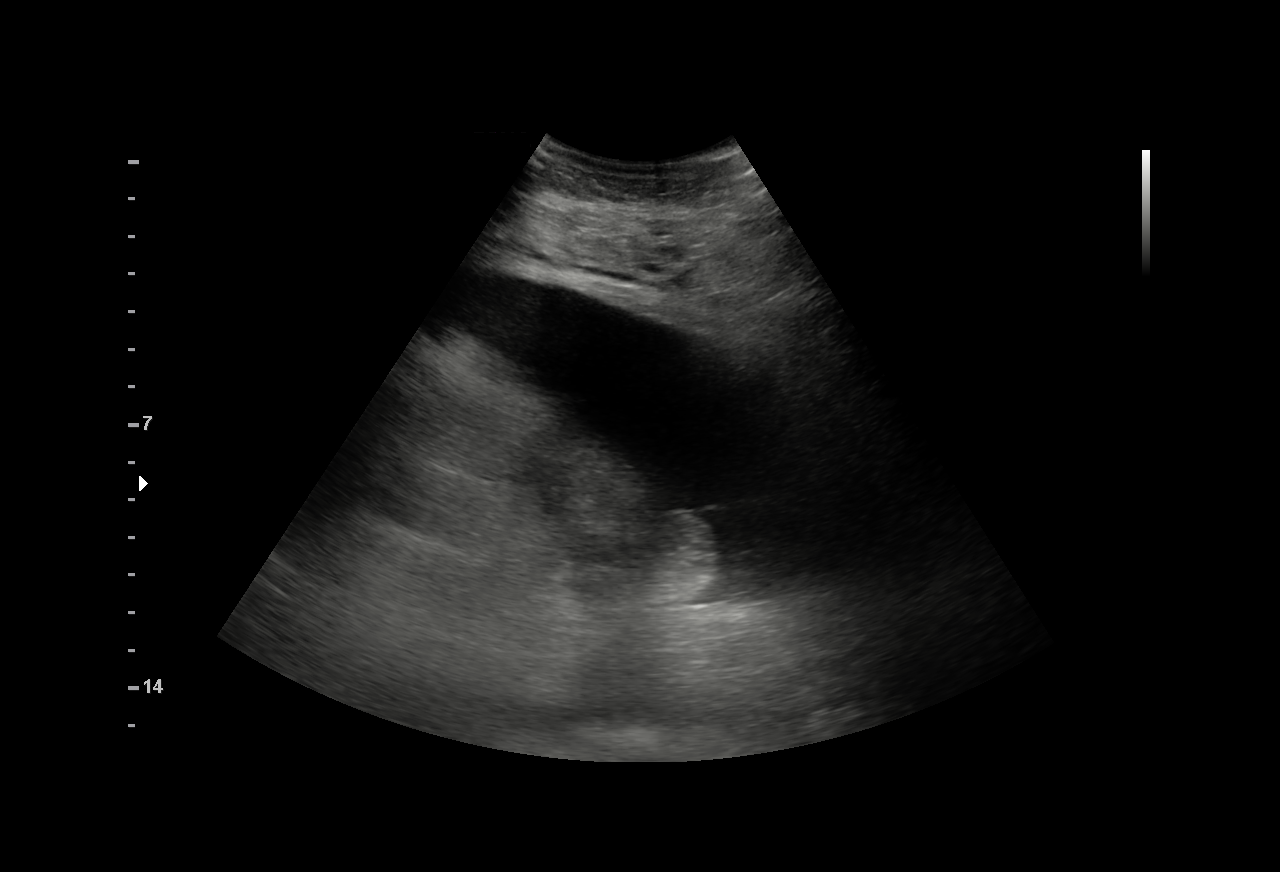
[im 3/4]
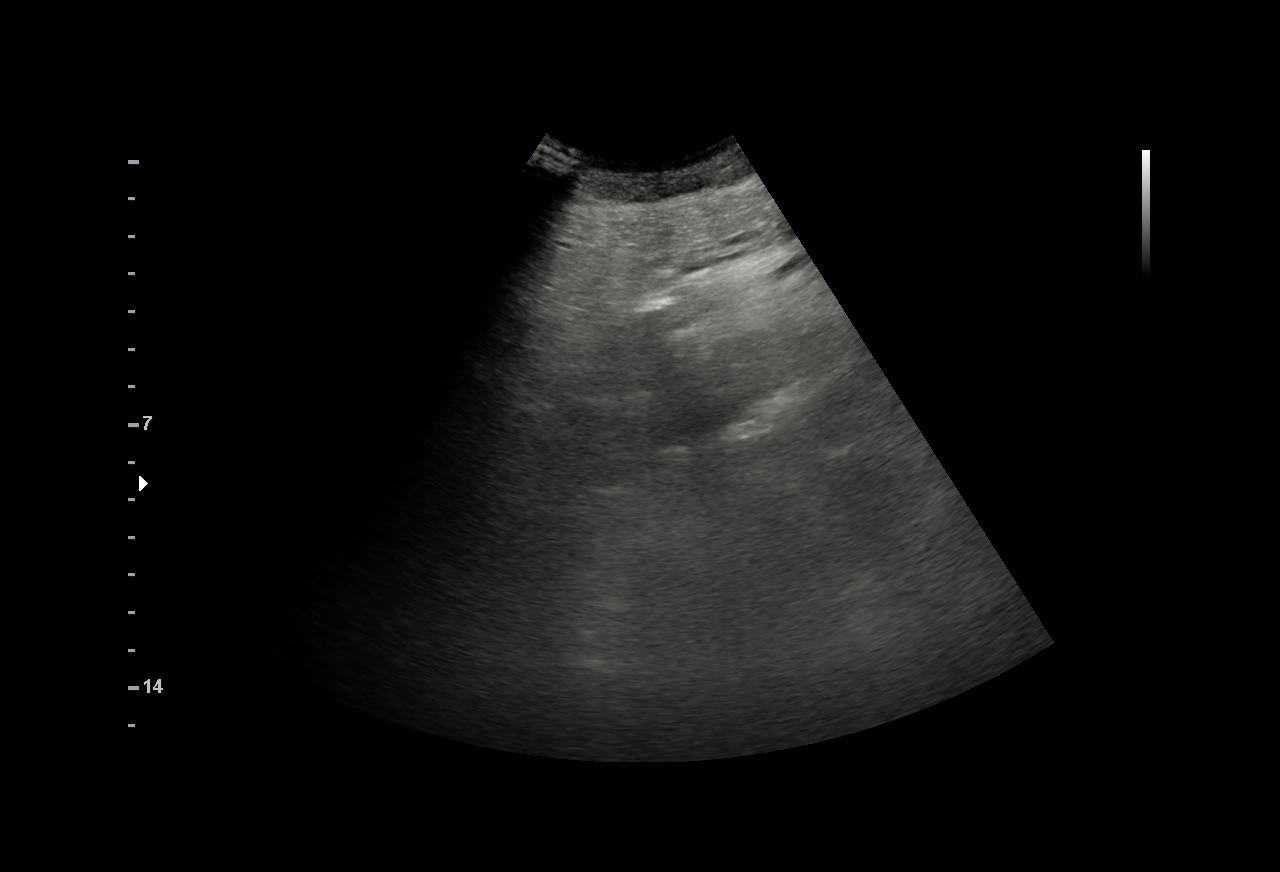
[im 4/4]
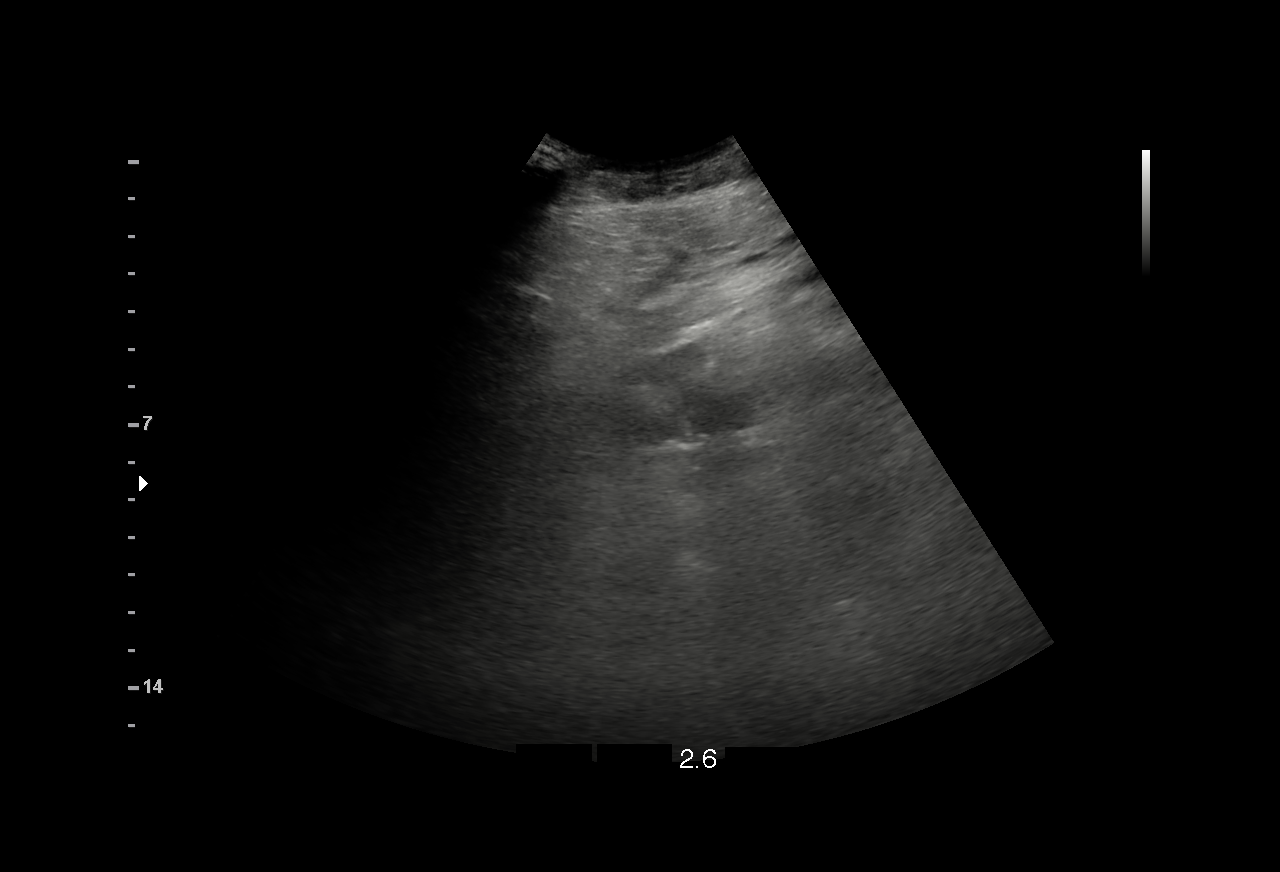

[4 of 4 positions shown; findings below may reference images not displayed]

EXAM:
ULTRASOUND GUIDED LEFT LOWER QUADRANT PARACENTESIS

MEDICATIONS:
None.

COMPLICATIONS:
None immediate.

PROCEDURE:
Informed written consent was obtained from the patient after a
discussion of the risks, benefits and alternatives to treatment. A
timeout was performed prior to the initiation of the procedure.

Initial ultrasound scanning demonstrates a large amount of ascites
within the left lower abdominal quadrant. The left lower abdomen was
prepped and draped in the usual sterile fashion. 1% lidocaine was
used for local anesthesia.

Following this, a 19 gauge, 10-cm, Yueh catheter was introduced. An
ultrasound image was saved for documentation purposes. The
paracentesis was performed. The catheter was removed and a dressing
was applied. The patient tolerated the procedure well without
immediate post procedural complication.
FINDINGS: A total of approximately 2.6 L of clear yellow fluid was removed.
IMPRESSION: Successful ultrasound-guided paracentesis yielding 2.6 liters of
peritoneal fluid.
